# Patient Record
Sex: Female | Born: 1940 | Race: White | Hispanic: No | Smoking: Never smoker
Health system: Southern US, Community
[De-identification: ages and names within clinical notes are randomized; demographics above are authoritative.]

## PROBLEM LIST (undated history)

## (undated) DIAGNOSIS — F419 Anxiety disorder, unspecified: Secondary | ICD-10-CM

## (undated) DIAGNOSIS — I10 Essential (primary) hypertension: Secondary | ICD-10-CM

## (undated) DIAGNOSIS — E039 Hypothyroidism, unspecified: Secondary | ICD-10-CM

## (undated) DIAGNOSIS — K219 Gastro-esophageal reflux disease without esophagitis: Secondary | ICD-10-CM

## (undated) DIAGNOSIS — R195 Other fecal abnormalities: Secondary | ICD-10-CM

## (undated) DIAGNOSIS — E785 Hyperlipidemia, unspecified: Secondary | ICD-10-CM

## (undated) DIAGNOSIS — E079 Disorder of thyroid, unspecified: Secondary | ICD-10-CM

## (undated) DIAGNOSIS — Z87442 Personal history of urinary calculi: Secondary | ICD-10-CM

## (undated) HISTORY — DX: Essential (primary) hypertension: I10

## (undated) HISTORY — PX: CERVICAL POLYPECTOMY: SHX88

## (undated) HISTORY — PX: HUMERUS FRACTURE SURGERY: SHX670

## (undated) HISTORY — PX: EYE SURGERY: SHX253

## (undated) HISTORY — DX: Hyperlipidemia, unspecified: E78.5

## (undated) HISTORY — DX: Disorder of thyroid, unspecified: E07.9

## (undated) HISTORY — PX: WRIST FRACTURE SURGERY: SHX121

## (undated) HISTORY — DX: Other fecal abnormalities: R19.5

## (undated) HISTORY — DX: Gastro-esophageal reflux disease without esophagitis: K21.9

## (undated) HISTORY — PX: BREAST CYST ASPIRATION: SHX578

---

## 1980-09-03 HISTORY — PX: TUBAL LIGATION: SHX77

## 2004-10-08 ENCOUNTER — Emergency Department: Payer: Self-pay | Admitting: Emergency Medicine

## 2004-10-08 ENCOUNTER — Other Ambulatory Visit: Payer: Self-pay

## 2004-10-16 ENCOUNTER — Ambulatory Visit: Payer: Self-pay | Admitting: Internal Medicine

## 2004-11-02 ENCOUNTER — Ambulatory Visit: Payer: Self-pay | Admitting: Internal Medicine

## 2004-11-03 ENCOUNTER — Ambulatory Visit: Payer: Self-pay | Admitting: Internal Medicine

## 2005-09-26 ENCOUNTER — Ambulatory Visit: Payer: Self-pay | Admitting: Internal Medicine

## 2006-06-03 ENCOUNTER — Encounter: Payer: Self-pay | Admitting: Internal Medicine

## 2006-06-03 LAB — CONVERTED CEMR LAB

## 2006-06-25 ENCOUNTER — Other Ambulatory Visit: Admission: RE | Admit: 2006-06-25 | Discharge: 2006-06-25 | Payer: Self-pay | Admitting: Internal Medicine

## 2006-06-25 ENCOUNTER — Ambulatory Visit: Payer: Self-pay | Admitting: Internal Medicine

## 2006-06-27 ENCOUNTER — Ambulatory Visit: Payer: Self-pay | Admitting: Internal Medicine

## 2006-12-25 ENCOUNTER — Ambulatory Visit: Payer: Self-pay | Admitting: Internal Medicine

## 2006-12-25 LAB — CONVERTED CEMR LAB
Albumin: 4 g/dL
BUN: 17 mg/dL
CO2: 28 meq/L
Calcium: 9.5 mg/dL
Chloride: 106 meq/L
Creatinine, Ser: 0.8 mg/dL
GFR calc Af Amer: 93 mL/min
GFR calc non Af Amer: 77 mL/min
Glucose, Bld: 121 mg/dL — ABNORMAL HIGH
Hgb A1c MFr Bld: 6 %
Phosphorus: 4.3 mg/dL
Potassium: 4.6 meq/L
Sodium: 142 meq/L

## 2007-01-02 HISTORY — PX: COLONOSCOPY: SHX174

## 2007-01-16 ENCOUNTER — Ambulatory Visit: Payer: Self-pay | Admitting: Internal Medicine

## 2007-01-16 ENCOUNTER — Encounter: Payer: Self-pay | Admitting: Internal Medicine

## 2007-01-30 ENCOUNTER — Ambulatory Visit: Payer: Self-pay | Admitting: Internal Medicine

## 2007-01-30 LAB — HM COLONOSCOPY

## 2007-02-06 ENCOUNTER — Encounter: Payer: Self-pay | Admitting: Internal Medicine

## 2007-02-06 DIAGNOSIS — E1169 Type 2 diabetes mellitus with other specified complication: Secondary | ICD-10-CM | POA: Insufficient documentation

## 2007-02-06 DIAGNOSIS — E785 Hyperlipidemia, unspecified: Secondary | ICD-10-CM

## 2007-02-06 DIAGNOSIS — E039 Hypothyroidism, unspecified: Secondary | ICD-10-CM | POA: Insufficient documentation

## 2007-02-06 DIAGNOSIS — M810 Age-related osteoporosis without current pathological fracture: Secondary | ICD-10-CM | POA: Insufficient documentation

## 2007-02-06 DIAGNOSIS — K219 Gastro-esophageal reflux disease without esophagitis: Secondary | ICD-10-CM | POA: Insufficient documentation

## 2007-02-06 DIAGNOSIS — I10 Essential (primary) hypertension: Secondary | ICD-10-CM | POA: Insufficient documentation

## 2007-02-08 ENCOUNTER — Encounter: Payer: Self-pay | Admitting: Internal Medicine

## 2007-02-10 ENCOUNTER — Ambulatory Visit: Payer: Self-pay | Admitting: Internal Medicine

## 2007-08-12 ENCOUNTER — Ambulatory Visit: Payer: Self-pay | Admitting: Internal Medicine

## 2007-08-12 DIAGNOSIS — R7301 Impaired fasting glucose: Secondary | ICD-10-CM | POA: Insufficient documentation

## 2007-08-13 LAB — CONVERTED CEMR LAB
Albumin: 4 g/dL
BUN: 14 mg/dL
Basophils Absolute: 0 10*3/uL
Basophils Relative: 0 %
CO2: 31 meq/L
Calcium: 10.8 mg/dL — ABNORMAL HIGH
Chloride: 100 meq/L
Cholesterol: 212 mg/dL
Creatinine, Ser: 0.8 mg/dL
Direct LDL: 137.2 mg/dL
Eosinophils Absolute: 0.4 10*3/uL
Eosinophils Relative: 4.1 %
Free T4: 1.1 ng/dL
GFR calc Af Amer: 93 mL/min
GFR calc non Af Amer: 77 mL/min
Glucose, Bld: 115 mg/dL — ABNORMAL HIGH
HCT: 39.8 %
HDL: 35.7 mg/dL — ABNORMAL LOW
Hemoglobin: 13.8 g/dL
Lymphocytes Relative: 23.9 %
MCHC: 34.6 g/dL
MCV: 85 fL
Monocytes Absolute: 0.7 10*3/uL
Monocytes Relative: 7.3 %
Neutro Abs: 5.9 10*3/uL
Neutrophils Relative %: 64.7 %
Phosphorus: 4.7 mg/dL — ABNORMAL HIGH
Platelets: 185 10*3/uL
Potassium: 4.5 meq/L
RBC: 4.68 M/uL
RDW: 12.2 %
Sodium: 139 meq/L
TSH: 1.14 u[IU]/mL
Total CHOL/HDL Ratio: 5.9
Triglycerides: 276 mg/dL
VLDL: 55 mg/dL — ABNORMAL HIGH
WBC: 9.2 10*3/uL

## 2007-09-16 ENCOUNTER — Encounter: Payer: Self-pay | Admitting: Internal Medicine

## 2007-09-19 ENCOUNTER — Encounter (INDEPENDENT_AMBULATORY_CARE_PROVIDER_SITE_OTHER): Payer: Self-pay | Admitting: *Deleted

## 2008-02-18 ENCOUNTER — Ambulatory Visit: Payer: Self-pay | Admitting: Internal Medicine

## 2008-02-19 LAB — CONVERTED CEMR LAB
Albumin: 4.1 g/dL
BUN: 14 mg/dL
CO2: 31 meq/L
Calcium: 9.7 mg/dL
Chloride: 104 meq/L
Creatinine, Ser: 0.8 mg/dL
GFR calc Af Amer: 92 mL/min
GFR calc non Af Amer: 76 mL/min
Glucose, Bld: 117 mg/dL — ABNORMAL HIGH
Hgb A1c MFr Bld: 6.3 % — ABNORMAL HIGH
Phosphorus: 4.4 mg/dL
Potassium: 4.5 meq/L
Sodium: 141 meq/L

## 2008-02-20 LAB — CONVERTED CEMR LAB
Calcium, Total (PTH): 9.9 mg/dL
PTH: 50.3 pg/mL

## 2008-05-28 ENCOUNTER — Emergency Department: Payer: Self-pay

## 2008-05-28 ENCOUNTER — Telehealth: Payer: Self-pay | Admitting: Internal Medicine

## 2008-08-23 ENCOUNTER — Ambulatory Visit: Payer: Self-pay | Admitting: Internal Medicine

## 2008-08-24 LAB — CONVERTED CEMR LAB
Albumin: 3.9 g/dL
BUN: 25 mg/dL — ABNORMAL HIGH
CO2: 29 meq/L
Calcium: 9.5 mg/dL
Chloride: 107 meq/L
Creatinine, Ser: 0.8 mg/dL
Free T4: 0.9 ng/dL
GFR calc Af Amer: 92 mL/min
GFR calc non Af Amer: 76 mL/min
Glucose, Bld: 121 mg/dL — ABNORMAL HIGH
Phosphorus: 4.5 mg/dL
Potassium: 4.3 meq/L
Sodium: 141 meq/L
TSH: 1.87 u[IU]/mL

## 2009-02-21 ENCOUNTER — Ambulatory Visit: Payer: Self-pay | Admitting: Internal Medicine

## 2009-03-02 ENCOUNTER — Encounter: Payer: Self-pay | Admitting: Internal Medicine

## 2009-03-11 ENCOUNTER — Encounter: Payer: Self-pay | Admitting: Internal Medicine

## 2009-04-24 ENCOUNTER — Emergency Department: Payer: Self-pay | Admitting: Unknown Physician Specialty

## 2009-04-26 ENCOUNTER — Ambulatory Visit: Payer: Self-pay | Admitting: Unknown Physician Specialty

## 2009-05-18 ENCOUNTER — Encounter: Payer: Self-pay | Admitting: Internal Medicine

## 2009-06-08 ENCOUNTER — Encounter: Payer: Self-pay | Admitting: Internal Medicine

## 2009-06-13 ENCOUNTER — Encounter: Payer: Self-pay | Admitting: Internal Medicine

## 2009-09-12 ENCOUNTER — Ambulatory Visit: Payer: Self-pay | Admitting: Internal Medicine

## 2009-09-12 DIAGNOSIS — I498 Other specified cardiac arrhythmias: Secondary | ICD-10-CM | POA: Insufficient documentation

## 2010-03-13 ENCOUNTER — Ambulatory Visit: Payer: Self-pay | Admitting: Internal Medicine

## 2010-03-15 LAB — CONVERTED CEMR LAB
Albumin: 4.2 g/dL
BUN: 19 mg/dL
CO2: 31 meq/L
Calcium: 10.7 mg/dL — ABNORMAL HIGH
Chloride: 103 meq/L
Creatinine, Ser: 0.8 mg/dL
GFR calc non Af Amer: 74.63 mL/min
Glucose, Bld: 102 mg/dL — ABNORMAL HIGH
Phosphorus: 4.1 mg/dL
Potassium: 4.8 meq/L
Sodium: 141 meq/L

## 2010-03-16 ENCOUNTER — Encounter: Payer: Self-pay | Admitting: Internal Medicine

## 2010-03-16 LAB — HM MAMMOGRAPHY: HM Mammogram: NORMAL

## 2010-09-13 ENCOUNTER — Ambulatory Visit: Admit: 2010-09-13 | Payer: Self-pay | Admitting: Internal Medicine

## 2010-10-01 LAB — CONVERTED CEMR LAB
ALT: 17 U/L
AST: 17 U/L
Albumin: 4 g/dL
Alkaline Phosphatase: 88 U/L
BUN: 17 mg/dL
Basophils Absolute: 0.1 10*3/uL
Basophils Relative: 0.6 %
Bilirubin, Direct: 0 mg/dL
CO2: 33 meq/L — ABNORMAL HIGH
Calcium: 10.4 mg/dL
Chloride: 102 meq/L
Creatinine, Ser: 0.9 mg/dL
Eosinophils Absolute: 0.3 10*3/uL
Eosinophils Relative: 2.9 %
Free T4: 0.9 ng/dL
GFR calc non Af Amer: 66.19 mL/min
Glucose, Bld: 111 mg/dL — ABNORMAL HIGH
HCT: 40.5 %
Hemoglobin: 13.7 g/dL
Hgb A1c MFr Bld: 6.3 %
Lymphocytes Relative: 19.2 %
Lymphs Abs: 1.9 10*3/uL
MCHC: 33.9 g/dL
MCV: 88.2 fL
Monocytes Absolute: 0.8 10*3/uL
Monocytes Relative: 7.8 %
Neutro Abs: 6.8 10*3/uL
Neutrophils Relative %: 69.5 %
Phosphorus: 4.8 mg/dL — ABNORMAL HIGH
Platelets: 193 10*3/uL
Potassium: 5.3 meq/L — ABNORMAL HIGH
RBC: 4.59 M/uL
RDW: 12.7 %
Sodium: 140 meq/L
TSH: 5.13 u[IU]/mL
Total Bilirubin: 1 mg/dL
Total Protein: 7.6 g/dL
WBC: 9.9 10*3/uL

## 2010-10-03 NOTE — Assessment & Plan Note (Signed)
 Summary: 6 M /FU  DLO   Vital Signs:  Patient Profile:   70 Years Old Female Height:     64 inches (160.66 cm) Weight:      156 pounds Temp:     97.5 degrees F oral Pulse rate:   68 / minute Pulse rhythm:   regular BP sitting:   132 / 76  (left arm) Cuff size:   regular  Vitals Entered By: Renelda Luster (August 23, 2008 10:25 AM)                 Chief Complaint:  follow-up.  History of Present Illness: Fractured top of right humerus at shoulder in September Put in sling Getting PT which is just ending Back to using it normally Still using diclofenac as needed   No other new problems  No chest pain No SOB No palpitations  Occ rare heartburn--mild Tums usually relieves  Not much exercise weight is about the same  No headaches or vision change recent eye exam    Current Allergies (reviewed today): No known allergies   Past Medical History:    Reviewed history from 08/12/2007 and no changes required:       GERD       Hyperlipidemia       Hypertension       Hypothyroidism       Osteoporosis       Glucose intolerance                Past Surgical History:    VD x 2     1991 - Cervical polyps ([place])    1980's  -  Breast cysts  (aspirated)    9/09  Fx right humerus-no surgery   Social History:    Reviewed history from 08/12/2007 and no changes required:       Marital Status: Married       Children: 2       Occupation: Tourist information centre manager. Daughter also lives with her now       Never Smoked       Alcohol use-no       Drug use-no           Review of Systems  The patient denies syncope, peripheral edema, abdominal pain, severe indigestion/heartburn, and depression.         Occ still gets anxiety--uses lorazepam  occ Husband will be home now--[employer] dealership is closing still has daughter and granddaughter   Physical Exam  General:     alert and normal appearance.   Neck:     supple, no masses, no thyromegaly, no carotid  bruits, and no cervical lymphadenopathy.   Lungs:     normal respiratory effort and normal breath sounds.   Heart:     regular rhythm, no murmur, and no gallop.   Msk:     only mild decrease in abduction and rotation of right shoulder Extremities:     no edema Neurologic:     alert & oriented X3 and strength normal in all extremities.   Psych:     normally interactive, good eye contact, not anxious appearing, and not depressed appearing.      Impression & Recommendations:  Problem # 1:  HYPERTENSION (ICD-401.9) Assessment: Unchanged good control  no changes needed  Her updated medication list for this problem includes:    Atenolol 25 Mg Tabs (Atenolol) .SABRA... 1 by mouth daily  BP today: 132/76 Prior BP: 140/80 (02/18/2008)  Labs Reviewed: Creat:  0.8 (02/18/2008) Chol: 212 (08/12/2007)   HDL: 35.7 (08/12/2007)   LDL: 137.2 (08/12/2007)   TG: 276 (08/12/2007)  Orders: TLB-Renal Function Panel (80069-RENAL)   Problem # 2:  HYPOTHYROIDISM (ICD-244.9) Assessment: Unchanged due to repeat blood work  Her updated medication list for this problem includes:    Synthroid  88 Mcg Tabs (Levothyroxine  sodium) .SABRA... 1 by mouth daily   generic  Labs Reviewed: TSH: 1.14 (08/12/2007)    HgBA1c: 6.3 (02/18/2008) Chol: 212 (08/12/2007)   HDL: 35.7 (08/12/2007)   LDL: 137.2 (08/12/2007)   TG: 276 (08/12/2007)  Orders: TLB-TSH (Thyroid  Stimulating Hormone) (84443-TSH) TLB-T4 (Thyrox), Free (15560-QU5M) Venipuncture (63584)   Problem # 3:  OSTEOPOROSIS (ICD-733.00) Assessment: Unchanged is on vitamin D  also humerus fracture was from sig fall  Her updated medication list for this problem includes:    Calcium  Carbonate 600 Mg Tabs (Calcium  carbonate) .SABRA... Daily   Problem # 4:  IMPAIRED FASTING GLUCOSE (ICD-790.21) Assessment: Comment Only will recheck  work on exercise  Complete Medication List: 1)  Atenolol 25 Mg Tabs (Atenolol) .SABRA.. 1 by mouth daily 2)  Synthroid  88 Mcg  Tabs (Levothyroxine  sodium) .SABRA.. 1 by mouth daily   generic 3)  Multivitamins Tabs (Multiple vitamin) .SABRA.. 1 by mouth daily 4)  Calcium  Carbonate 600 Mg Tabs (Calcium  carbonate) .... Daily 5)  Diclofenac Sodium 75 Mg Tbec (Diclofenac sodium) .SABRA.. 1 two times a day as needed for pain 6)  Lorazepam  0.5 Mg Tabs (Lorazepam ) .SABRA.. 1 two times a day as needed for anxiety   Patient Instructions: 1)  Please schedule a follow-up appointment in 6 months.   Prescriptions: LORAZEPAM  0.5 MG TABS (LORAZEPAM ) 1 two times a day as needed for anxiety  #60 x 0   Entered and Authorized by:   Charlie Richmond Denise MD   Signed by:   Charlie Richmond Denise MD on 08/23/2008   Method used:   Print then Give to Patient   RxID:   [id number]  ] Current Allergies (reviewed today): No known allergies

## 2010-10-03 NOTE — Letter (Signed)
 Summary: [facility]  [facility]   Imported By: Mliss Shutter 06/17/2009 16:53:25  _____________________________________________________________________  External Attachment:    Type:   Image     Comment:   External Document  Appended Document: [facility] DEXA -2.6 in spine -1.5 in hip On calcium  and vitamin D 

## 2010-10-03 NOTE — Letter (Signed)
 Summary: Results Follow up Letter  [place] at [facility]  [address] [place], [place] [address]   Phone: [phone]  Fax: [phone]    09/19/2007 MRN: [id number]  BRAYLEN DENUNZIO [address] [place], [place]  [address]  Dear Ms. Swallow,  The following are the results of your recent test(s):  Test         Result    Pap Smear:        Normal _____  Not Normal _____ Comments: ______________________________________________________ Cholesterol: LDL(Bad cholesterol):         Your goal is less than:         HDL (Good cholesterol):       Your goal is more than: Comments:  ______________________________________________________ Mammogram:        Normal __X___  Not Normal _____ Comments:  ___________________________________________________________________ Hemoccult:        Normal _____  Not normal _______ Comments:    _____________________________________________________________________ Other Tests:    We routinely do not discuss normal results over the telephone.  If you desire a copy of the results, or you have any questions about this information we can discuss them at your next office visit.   Sincerely,  Dr. Letvak

## 2010-10-03 NOTE — Assessment & Plan Note (Signed)
 Summary: 6 mo. f/u/bir   Vital Signs:  Patient profile:   70 year old female Weight:      156 pounds BMI:     26.87 Temp:     98.4 degrees F oral Pulse rate:   72 / minute Pulse rhythm:   regular BP sitting:   110 / 68  (left arm) Cuff size:   regular  Vitals Entered By: Judge Sharps CMA (February 21, 2009 10:50 AM)  History of Present Illness: Chief Complaint: 6 month follow-up   doing fine Feels good  No chest pain  No SOB Doesn't check blood pressure No headaches  No stomach problems Occ heartburn--uses Tums as needed and it helps could be if she eats too fast  Tries to be careful limiting sweets Still puts sugar in tea--discussed using artificial sweeteners Occ walks some--not really into it yet  Allergies: No Known Drug Allergies  Past History:  Past medical, surgical, family and social histories (including risk factors) reviewed for relevance to current acute and chronic problems.  Past Medical History: GERD Hyperlipidemia Hypertension Hypothyroidism Osteoporosis Impaired fasting glucose  Past Surgical History: Reviewed history from 08/23/2008 and no changes required. VD x 2  1991 - Cervical polyps ([place]) 1980's  -  Breast cysts  (aspirated) 9/09  Fx right humerus-no surgery  Family History: Reviewed history from 02/06/2007 and no changes required. Father: Died 24 MI, lung cancer Mother: Died 20, HTN, stroke Siblings: 2 brothers alive, NIDDM, HTN CAD--  Dad (due to CA?), Mat GP died of MI HTN-- Mom, mat. aunt, son DM:  Brothers Strokes in family Pat  uncle died ?melanoma     Pat  aunt died [place]  Social History: Reviewed history from 08/12/2007 and no changes required. Marital Status: Married Children: 2 Occupation: Tourist information centre manager. Daughter also lives with her now Never Smoked Alcohol use-no Drug use-no  Review of Systems       Weight is stable sleeps fine in general--does occ waken in night--usually able to get back to  sleep bowels okay  Physical Exam  General:  alert and normal appearance.   Neck:  supple, no masses, no thyromegaly, no carotid bruits, and no cervical lymphadenopathy.   Lungs:  normal respiratory effort and normal breath sounds.   Heart:  normal rate, regular rhythm, no murmur, and no gallop.   Abdomen:  soft and non-tender.   Msk:  no joint tenderness and no joint swelling.   Extremities:  no edema Psych:  normally interactive, good eye contact, not anxious appearing, and not depressed appearing.     Impression & Recommendations:  Problem # 1:  HYPERTENSION (ICD-401.9) Assessment Unchanged good control no changes needed  Her updated medication list for this problem includes:    Atenolol 25 Mg Tabs (Atenolol) .SABRA... 1 by mouth daily  BP today: 110/68 Prior BP: 132/76 (08/23/2008)  Labs Reviewed: K+: 4.3 (08/23/2008) Creat: : 0.8 (08/23/2008)   Chol: 212 (08/12/2007)   HDL: 35.7 (08/12/2007)   LDL: DEL (08/12/2007)   TG: 276 (08/12/2007)  Problem # 2:  IMPAIRED FASTING GLUCOSE (ICD-790.21) Assessment: Comment Only counselled about better eating fortunately weight is stable  Problem # 3:  GERD (ICD-530.81) Assessment: Unchanged good control only occ needs tums  Problem # 4:  HYPOTHYROIDISM (ICD-244.9) Assessment: Unchanged good control labs next time  Her updated medication list for this problem includes:    Synthroid  88 Mcg Tabs (Levothyroxine  sodium) .SABRA... 1 by mouth daily   generic  Labs Reviewed: TSH: 1.87 (  08/23/2008)    HgBA1c: 6.3 (02/18/2008) Chol: 212 (08/12/2007)   HDL: 35.7 (08/12/2007)   LDL: DEL (08/12/2007)   TG: 276 (08/12/2007)  Complete Medication List: 1)  Atenolol 25 Mg Tabs (Atenolol) .SABRA.. 1 by mouth daily 2)  Synthroid  88 Mcg Tabs (Levothyroxine  sodium) .SABRA.. 1 by mouth daily   generic 3)  Multivitamins Tabs (Multiple vitamin) .SABRA.. 1 by mouth daily 4)  Calcium  Carbonate 600 Mg Tabs (Calcium  carbonate) .... Daily  Other Orders: Radiology  Referral (Radiology)  Patient Instructions: 1)  Please schedule a follow-up appointment in 6 months .   Current Allergies (reviewed today): No known allergies

## 2010-10-03 NOTE — Letter (Signed)
 Summary: Results Follow up Letter  [name] at [facility]  [address] [place], [place] [address]   Phone: [phone]  Fax: [phone]    03/11/2009 MRN: [id number]  [name] [address] [place], [place]  [address]  Dear Ms. [place]aston,  The following are the results of your recent test(s):  Test         Result    Pap Smear:        Normal _____  Not Normal _____ Comments: ______________________________________________________ Cholesterol: LDL(Bad cholesterol):         Your goal is less than:         HDL (Good cholesterol):       Your goal is more than: Comments:  ______________________________________________________ Mammogram:        Normal __X___  Not Normal _____ Comments: Repeat in 1 year  ___________________________________________________________________ Hemoccult:        Normal _____  Not normal _______ Comments:    _____________________________________________________________________ Other Tests:    We routinely do not discuss normal results over the telephone.  If you desire a copy of the results, or you have any questions about this information we can discuss them at your next office visit.   Sincerely,      Charlie Jimmy COME

## 2010-10-03 NOTE — Miscellaneous (Signed)
" °  Clinical Lists Changes  Observations: Added new observation of MAMMOGRAM: normal (09/19/2007 15:19)       Preventive Care Screening  Mammogram:    Date:  09/19/2007    Results:  normal "

## 2010-10-03 NOTE — Letter (Signed)
 Summary: [facility]   Imported By: Mliss Shutter 06/27/2009 15:59:07  _____________________________________________________________________  External Attachment:    Type:   Image     Comment:   External Document  Appended Document: [facility] doing well postop for left wrist

## 2010-10-03 NOTE — Assessment & Plan Note (Signed)
Summary: 6 m f/u dlo   Vital Signs:  Patient profile:   70 year old female Weight:      154 pounds Temp:     98.6 degrees F oral Pulse rate:   52 / minute Pulse rhythm:   regular BP sitting:   130 / 60  (left arm) Cuff size:   regular  Vitals Entered By: Mervin Hack CMA Duncan Dull) (September 12, 2009 10:29 AM) CC: 6 month follow-up   History of Present Illness: Doing well in general Recovered from surgery to left wrist this summer  Notes intermittent right knee pain Can be severe at times wears brace at times--when pain comes on No swelling Pain along medial border--hard to walk for 2 days Esp bad with twisting  reviewed bone density results will just continue calcuim and vitamin D  No problems with BP med No chest pain No SOB  Allergies: No Known Drug Allergies  Past History:  Past medical, surgical, family and social histories (including risk factors) reviewed for relevance to current acute and chronic problems.  Past Medical History: Reviewed history from 02/21/2009 and no changes required. GERD Hyperlipidemia Hypertension Hypothyroidism Osteoporosis Impaired fasting glucose  Past Surgical History: VD x 2  1991 - Cervical polyps ([place]) 1980's  -  Breast cysts  (aspirated) 9/09  Fx right humerus-no surgery 8/10 Left wrist fracture--plate put in  --- Dr Gerrit Heck  Family History: Reviewed history from 02/06/2007 and no changes required. Father: Died 21 MI, lung cancer Mother: Died 36, HTN, stroke Siblings: 2 brothers alive, NIDDM, HTN CAD--  Dad (due to CA?), Mat GP died of MI HTN-- Mom, mat. aunt, son DM:  Brothers Strokes in family Pat  uncle died ?melanoma     Pat  aunt died [place]  Social History: Reviewed history from 08/12/2007 and no changes required. Marital Status: Married Children: 2 Occupation: Tourist information centre manager. Daughter also lives with her now Never Smoked Alcohol use-no Drug use-no  Review of Systems       occ very brief  orthosatic dizziness notes bradycardia at times--as low as 35 last year into 40's not unusually weight is stable  Physical Exam  General:  alert and normal appearance.   Neck:  supple, no masses, no thyromegaly, no carotid bruits, and no cervical lymphadenopathy.   Lungs:  normal respiratory effort and normal breath sounds.   Heart:  regular rhythm, no murmur, no gallop, and bradycardia.   Abdomen:  soft and non-tender.   Msk:  No swelling in knees normal ROM very slight discomfort with medial meniscus stress on MacMurray's Extremities:  no edema Psych:  normally interactive, good eye contact, not anxious appearing, and not depressed appearing.     Impression & Recommendations:  Problem # 1:  HYPERTENSION (ICD-401.9) Assessment Unchanged  good control stable bradycardia--never any symptoms will cut the atenolol in half  Her updated medication list for this problem includes:    Atenolol 25 Mg Tabs (Atenolol) .Marland Kitchen... 1/2  by mouth daily  BP today: 130/60 Prior BP: 110/68 (02/21/2009)  Labs Reviewed: K+: 4.3 (08/23/2008) Creat: : 0.8 (08/23/2008)   Chol: 212 (08/12/2007)   HDL: 35.7 (08/12/2007)   LDL: DEL (08/12/2007)   TG: 276 (08/12/2007)  Orders: TLB-Renal Function Panel (80069-RENAL) TLB-CBC Platelet - w/Differential (85025-CBCD) TLB-Hepatic/Liver Function Pnl (80076-HEPATIC) Venipuncture (64403)  Problem # 2:  OSTEOPOROSIS (ICD-733.00) Assessment: Unchanged will recheck DEXA in 3-5 years consider meds again if sig decline  The following medications were removed from the medication list:  Calcium Carbonate 600 Mg Tabs (Calcium carbonate) .Marland Kitchen... Daily Her updated medication list for this problem includes:    Oscal 500/200 D-3 500-200 Mg-unit Tabs (Calcium-vitamin d) .Marland Kitchen... Take 1 by mouth two times a day  Problem # 3:  HYPOTHYROIDISM (ICD-244.9) Assessment: Unchanged  due for labs  Her updated medication list for this problem includes:    Synthroid 88 Mcg  Tabs (Levothyroxine sodium) .Marland Kitchen... 1 by mouth daily   generic  Labs Reviewed: TSH: 1.87 (08/23/2008)    HgBA1c: 6.3 (02/18/2008) Chol: 212 (08/12/2007)   HDL: 35.7 (08/12/2007)   LDL: DEL (08/12/2007)   TG: 276 (08/12/2007)  Orders: TLB-T4 (Thyrox), Free [id number]) TLB-TSH (Thyroid Stimulating Hormone) (84443-TSH)  Problem # 4:  IMPAIRED FASTING GLUCOSE (ICD-790.21) Assessment: Unchanged  will check labs again  Labs Reviewed: Creat: 0.8 (08/23/2008)     Orders: TLB-A1C / Hgb A1C (Glycohemoglobin) (83036-A1C)  Complete Medication List: 1)  Atenolol 25 Mg Tabs (Atenolol) .... 1/2  by mouth daily 2)  Synthroid 88 Mcg Tabs (Levothyroxine sodium) .Marland Kitchen.. 1 by mouth daily   generic 3)  Multivitamins Tabs (Multiple vitamin) .Marland Kitchen.. 1 by mouth daily 4)  Oscal 500/200 D-3 500-200 Mg-unit Tabs (Calcium-vitamin d) .... Take 1 by mouth two times a day  Other Orders: EKG w/ Interpretation (93000)  Patient Instructions: 1)  Please schedule a follow-up appointment in 6 months .   Current Allergies (reviewed today): No known allergies      EKG  Procedure date:  09/12/2009  Findings:      sinus bradycardia @38  No sig change from 1/07

## 2010-10-03 NOTE — Miscellaneous (Signed)
" °  Clinical Lists Changes  Observations: Added new observation of COLONOSCOPY:  Results: Diverticulosis.        (01/30/2007 13:50)     Colonoscopy  Procedure date:  01/30/2007  Findings:       Results: Diverticulosis.         "

## 2010-10-03 NOTE — Assessment & Plan Note (Signed)
 Summary: 1-2 month follow up/rbh   Vital Signs:  Patient Profile:   70 Years Old Female Height:     63.25 inches (160.66 cm) Weight:      156 pounds Temp:     98.2 degrees F oral Pulse rate:   60 / minute BP sitting:   138 / 72  (left arm)  Vitals Entered By: Avelina Lefevre (February 10, 2007 9:55 AM)               Chief Complaint:  1 - 2 mo fu.  History of Present Illness: Cough is better took prilosec for 2 weeks and has used it only once since then No reflux symptoms now does get tickle and occ cough but better with a sucking candy  Stopped fosamax--was on for 4 years. Discussed rechecking this in 2-3 years and reevaluating the need for Rx I recommended she continue the calcium  and Vit D  Has peeling and dry hands--thinks her yard work exacerbates Uses cetaphil and triamcinolone  Dove soap  Current Allergies: No known allergies   Past Medical History:    Reviewed history from 02/06/2007 and no changes required:       GERD       Hyperlipidemia       Hypertension       Hypothyroidism       Osteoporosis       Glucose intolerance              CONSULTANTS       Dr Roni      Physical Exam  General:     alert and normal appearance.   Neck:     supple, no thyromegaly, and no carotid bruits.   Lungs:     normal respiratory effort and normal breath sounds.   Heart:     normal rate, regular rhythm, no murmur, and no gallop.   Abdomen:     soft, non-tender, and no masses.   Extremities:     no edema Skin:     mild drying in hands Psych:     normally interactive, good eye contact, not anxious appearing, and not depressed appearing.      Impression & Recommendations:  Problem # 1:  COUGH (ICD-786.2) Assessment: Improved Probably was reflux related and is improved off fosamax despite not staying on prilosec  Problem # 2:  HYPERTENSION (ICD-401.9) Assessment: Unchanged God control Her updated medication list for this problem includes:    Atenolol  25 Mg Tabs (Atenolol) .SABRA... 1 by mouth daily    Patient Instructions: 1)  Please schedule a follow-up appointment in 6 months.

## 2010-10-03 NOTE — Assessment & Plan Note (Signed)
Summary: 6 MONTH FOLLOW UP/[facility]   Vital Signs:  Patient profile:   70 year old female Weight:      152 pounds BMI:     26.19 Temp:     97.5 degrees F tympanic Pulse rate:   56 / minute Pulse rhythm:   regular BP sitting:   148 / 70  (left arm) Cuff size:   regular  Vitals Entered By: Mervin Hack CMA Duncan Dull) (March 13, 2010 10:44 AM) CC: 6 month follow-up   History of Present Illness: Doing well Wrist is completely healed up -- "finally 100%" in using it  No chest pain No SOB Exercise tolerance is good--back to walking  Occ right knee pain wears brace for walking no sig pain meds needed  No sig heartburn bowels okay  Allergies: No Known Drug Allergies  Past History:  Past medical, surgical, family and social histories (including risk factors) reviewed for relevance to current acute and chronic problems.  Past Medical History: Reviewed history from 02/21/2009 and no changes required. GERD Hyperlipidemia Hypertension Hypothyroidism Osteoporosis Impaired fasting glucose  Past Surgical History: Reviewed history from 09/12/2009 and no changes required. VD x 2  1991 - Cervical polyps ([place]) 1980's  -  Breast cysts  (aspirated) 9/09  Fx right humerus-no surgery 8/10 Left wrist fracture--plate put in  --- Dr Gerrit Heck  Family History: Reviewed history from 02/06/2007 and no changes required. Father: Died 51 MI, lung cancer Mother: Died 74, HTN, stroke Siblings: 2 brothers alive, NIDDM, HTN CAD--  Dad (due to [place]?), Mat GP died of MI HTN-- Mom, mat. aunt, son DM:  Brothers Strokes in family Pat  uncle died ?melanoma     Pat  aunt died [place]  Social History: Reviewed history from 08/12/2007 and no changes required. Marital Status: Married Children: 2 Occupation: Tourist information centre manager. Daughter also lives with her now Never Smoked Alcohol use-no Drug use-no  Review of Systems       does self breast exam--nothing worrisome sleeps fairly well--does have  some early awakening but goes to sleep early also appetite is fine weight is stable trying to walk daily  Physical Exam  General:  alert and normal appearance.   Neck:  supple, no masses, no thyromegaly, no carotid bruits, and no cervical lymphadenopathy.   Lungs:  normal respiratory effort and normal breath sounds.   Heart:  regular rhythm, no murmur, no gallop, and bradycardia.   Abdomen:  soft, non-tender, and no masses.   Msk:  no joint tenderness and no joint swelling.   Pulses:  2+ in feet Extremities:  no edema Psych:  normally interactive, good eye contact, not anxious appearing, and not depressed appearing.     Impression & Recommendations:  Problem # 1:  HYPERTENSION (ICD-401.9) Assessment Unchanged  good control still with bradycardia from the med but tolerates well  Her updated medication list for this problem includes:    Atenolol 25 Mg Tabs (Atenolol) .Marland Kitchen... 1/2  by mouth daily  BP today: 148/70 Prior BP: 130/60 (09/12/2009)  Labs Reviewed: K+: 5.3 (09/12/2009) Creat: : 0.9 (09/12/2009)   Chol: 212 (08/12/2007)   HDL: 35.7 (08/12/2007)   LDL: DEL (08/12/2007)   TG: 276 (08/12/2007)  Orders: TLB-Renal Function Panel (80069-RENAL) Venipuncture (09811)  Problem # 2:  GERD (ICD-530.81) Assessment: Unchanged doing well without meds  Problem # 3:  OSTEOPOROSIS (ICD-733.00) Assessment: Unchanged remains on supplements  Her updated medication list for this problem includes:    Oscal 500/200 D-3 500-200 Mg-unit Tabs (Calcium-vitamin  d) ..... Take 1 by mouth once daily  Problem # 4:  HYPOTHYROIDISM (ICD-244.9) Assessment: Unchanged euthyroid  Her updated medication list for this problem includes:    Synthroid 88 Mcg Tabs (Levothyroxine sodium) .Marland Kitchen... 1 by mouth daily   generic  Labs Reviewed: TSH: 5.13 (09/12/2009)    HgBA1c: 6.3 (09/12/2009) Chol: 212 (08/12/2007)   HDL: 35.7 (08/12/2007)   LDL: DEL (08/12/2007)   TG: 276 (08/12/2007)  Complete  Medication List: 1)  Atenolol 25 Mg Tabs (Atenolol) .... 1/2  by mouth daily 2)  Synthroid 88 Mcg Tabs (Levothyroxine sodium) .Marland Kitchen.. 1 by mouth daily   generic 3)  Multivitamins Tabs (Multiple vitamin) .Marland Kitchen.. 1 by mouth daily 4)  Oscal 500/200 D-3 500-200 Mg-unit Tabs (Calcium-vitamin d) .... Take 1 by mouth once daily 5)  Vitamin B-12 1000 Mcg Tabs (Cyanocobalamin) .... Take 1 by mouth once daily  Other Orders: Radiology Referral (Radiology)  Patient Instructions: 1)  Please schedule a follow-up appointment in 6 months .  2)  Schedule your mammogram.   Current Allergies (reviewed today): No known allergies

## 2010-10-03 NOTE — Assessment & Plan Note (Signed)
 Summary: FOLLOW UP/EVE   Vital Signs:  Patient Profile:   70 Years Old Female Height:     63.25 inches (160.66 cm) Weight:      159.13 pounds Temp:     97 degrees F oral Pulse rate:   60 / minute BP sitting:   154 / 62  (left arm)  Vitals Entered By: Avelina Lefevre (August 12, 2007 10:14 AM)                 Chief Complaint:  6 mo fu.  History of Present Illness: Doing okay No new concerns  Has gained about 3# Was walking but has slacked off Still watches granddaughter---daughter and granddaughter both living with her  Hasn't seen Dr Roni Due for mammo Paps have always been normal  Current Allergies (reviewed today): No known allergies   Past Medical History:    Reviewed history from 02/06/2007 and no changes required:       GERD       Hyperlipidemia       Hypertension       Hypothyroidism       Osteoporosis       Glucose intolerance                Past Surgical History:    Reviewed history from 02/06/2007 and no changes required:       VD x 2        1991 - Cervical polyps ([place])       1980's  -  Breast cysts  (aspirated)   Family History:    Reviewed history from 02/06/2007 and no changes required:       Father: Died 64 MI, lung cancer       Mother: Died 32, HTN, stroke       Siblings: 2 brothers alive, NIDDM, HTN       CAD--  Dad (due to [place]?), Mat GP died of MI       HTN-- Mom, mat. aunt, son       DM:  Brothers       Strokes in family       Pat  uncle died ?melanoma     Pat  aunt died [place]  Social History:    Marital Status: Married    Children: 2    Occupation: Tourist information centre manager. Daughter also lives with her now    Never Smoked    Alcohol use-no    Drug use-no        Review of Systems  The patient denies chest pain, syncope, dyspnea on exhertion, abdominal pain, melena, hematochezia, and depression.         Occ gets substernal burning when under stress uses omeprazole if this is more freq and it resolves Has not  needed lorazepam  lately Some memory problems--not progressive Stomach bug about 3 months ago   Physical Exam  General:     alert and normal appearance.   Mouth:     no erythema and no exudates.   Neck:     supple, no masses, no thyromegaly, no carotid bruits, and no cervical lymphadenopathy.   Lungs:     normal respiratory effort and normal breath sounds.   Heart:     normal rate, regular rhythm, no murmur, and no gallop.   Abdomen:     soft, non-tender, and no masses.   Extremities:     no edema Psych:     normally interactive, good eye contact, not  anxious appearing, and not depressed appearing.      Impression & Recommendations:  Problem # 1:  HYPERTENSION (ICD-401.9) Assessment: Unchanged good control No changes needed  Her updated medication list for this problem includes:    Atenolol 25 Mg Tabs (Atenolol) .SABRA... 1 by mouth daily  Orders: TLB-Renal Function Panel (80069-RENAL) TLB-CBC Platelet - w/Differential (85025-CBCD)   Problem # 2:  HYPOTHYROIDISM (ICD-244.9) Assessment: Unchanged will check labs  Her updated medication list for this problem includes:    Synthroid  88 Mcg Tabs (Levothyroxine  sodium) .SABRA... 1 by mouth daily   generic  Orders: TLB-T4 (Thyrox), Free [id number]) TLB-TSH (Thyroid  Stimulating Hormone) (84443-TSH)   Problem # 3:  IMPAIRED FASTING GLUCOSE (ICD-790.21) Assessment: Unchanged recheck labs Needs to exercise and watch weight  Problem # 4:  GERD (ICD-530.81) Assessment: Unchanged does okay with as needed omeprazole  Problem # 5:  OSTEOPOROSIS (ICD-733.00) Assessment: Unchanged needs to refill her vitamin D   Her updated medication list for this problem includes:    Calcium  Carbonate 600 Mg Tabs (Calcium  carbonate) .SABRA... Daily   Complete Medication List: 1)  Atenolol 25 Mg Tabs (Atenolol) .SABRA.. 1 by mouth daily 2)  Synthroid  88 Mcg Tabs (Levothyroxine  sodium) .SABRA.. 1 by mouth daily   generic 3)  Multivitamins Tabs  (Multiple vitamin) .SABRA.. 1 by mouth daily 4)  Calcium  Carbonate 600 Mg Tabs (Calcium  carbonate) .... Daily 5)  Lorazepam  0.5 Mg Tabs (Lorazepam ) .SABRA.. 1 two times a day as needed for anxiety  Other Orders: Flu Vaccine 57yrs + (09341) Admin 1st Vaccine (09528) Radiology Referral (Radiology) Venipuncture (63584) TLB-Lipid Panel (80061-LIPID)   Patient Instructions: 1)  Please schedule a follow-up appointment in 6 months. Will need Pap and breast exam then 2)  Schedule your mammogram.    ]   Current Allergies (reviewed today): No known allergies  Current Medications (including changes made in today's visit):  ATENOLOL 25 MG TABS (ATENOLOL) 1 by mouth daily SYNTHROID  88 MCG TABS (LEVOTHYROXINE  SODIUM) 1 by mouth daily   Generic MULTIVITAMINS  TABS (MULTIPLE VITAMIN) 1 by mouth daily CALCIUM  CARBONATE 600 MG TABS (CALCIUM  CARBONATE) daily LORAZEPAM  0.5 MG  TABS (LORAZEPAM ) 1 two times a day as needed for anxiety    Influenza Vaccine    Vaccine Type: Fluvax 3+    Site: right deltoid    Mfr: Sanofi Pasteur    Dose: 0.5 ml    Route: IM    Given by: Avelina Lefevre    Exp. Date: 03/02/2008    Lot #: [id number]    VIS given: 03/27/07 version given August 12, 2007.  Flu Vaccine Consent Questions    Do you have a history of severe allergic reactions to this vaccine? no    Any prior history of allergic reactions to egg and/or gelatin? no    Do you have a sensitivity to the preservative Thimersol? no    Do you have a past history of Guillan-Barre Syndrome? no    Do you currently have an acute febrile illness? no    Have you ever had a severe reaction to latex? no    Vaccine information given and explained to patient? yes    Are you currently pregnant? no

## 2010-10-03 NOTE — Progress Notes (Signed)
 Summary: PT fell at home and wante to be advised  Phone Note Call from Patient Call back at Home Phone [phone]   Caller: Patient Summary of Call: Pt called and stated that about 20 minutes earlier she had fallen forward in her home and hit her mouth, knee and shoulder. She could not straighten her arm it hurt so bad. She called her husband to come home because she needed assistance. She wanted to know if she should come into be seen or go to ER. I spoke to Dr Jimmy and he advised the pt should go directly to ER. Pt was advised. She was not happy because she did not want to have a long wait in the [facility] ER. I told her she could go the the [facility] but she said she is [place] a couple of miles from [facility]. She asked if I thought she should wait a couple of hours. I re-told her what the Dr. said and she said she would go w/her husband to the ER. Initial call taken by: Aleck Cypher,  May 28, 2008 9:15 AM  Follow-up for Phone Call        please check on her to see she made out Follow-up by: Charlie Richmond Jimmy MD,  May 28, 2008 2:54 PM  Additional Follow-up for Phone Call Additional follow up Details #1::        [facility] PATIENT SHE STATES HUMEROUS BONE IS FRACTURE THEY GIVE HER A SHOT AND PUT HER IN A SLING  AND SENT HER HOME WAITING FOR DR. CALIFF  OFFICE TO CALL HER THEY GIVE HER OXYCODONE  5-325 NOT HELPING MUCH Additional Follow-up by: Apolinar Cones,  May 28, 2008 4:04 PM    Additional Follow-up for Phone Call Additional follow up Details #2::    please let her know to try 2 at a time if 1 ineffective--esp when she is trying to go to sleep Let her know I am sorry it happened Charlie Richmond Jimmy MD  May 28, 2008 4:28 PM   Additional Follow-up for Phone Call Additional follow up Details #3:: Details for Additional Follow-up Action Taken: called patient about hte pain pills she states they are making her nauseated  took husbands promethazine 25 mg only has 2 left  uses [facility] [address]   okay #10 x 0 1 three times a day as needed  Charlie Richmond Jimmy MD  May 28, 2008 5:25 PM  Additional Follow-up by: Apolinar Cones,  May 28, 2008 5:22 PM  New/Updated Medications: PROMETHAZINE HCL 25 MG TABS (PROMETHAZINE HCL) 1 three times a day as needed as needed

## 2010-10-03 NOTE — Letter (Signed)
 Summary: [facility] 04/25/09 - 05/18/09  [facility] 04/25/09 - 05/18/09   Imported By: Mliss Shutter 05/25/2009 10:02:55  _____________________________________________________________________  External Attachment:    Type:   Image     Comment:   External Document

## 2010-10-10 ENCOUNTER — Ambulatory Visit: Payer: Self-pay | Admitting: Internal Medicine

## 2010-10-24 ENCOUNTER — Other Ambulatory Visit: Payer: Self-pay | Admitting: Internal Medicine

## 2010-10-24 ENCOUNTER — Ambulatory Visit (INDEPENDENT_AMBULATORY_CARE_PROVIDER_SITE_OTHER): Payer: Medicare Other | Admitting: Internal Medicine

## 2010-10-24 ENCOUNTER — Telehealth (INDEPENDENT_AMBULATORY_CARE_PROVIDER_SITE_OTHER): Payer: Self-pay | Admitting: *Deleted

## 2010-10-24 ENCOUNTER — Encounter: Payer: Self-pay | Admitting: Internal Medicine

## 2010-10-24 DIAGNOSIS — I10 Essential (primary) hypertension: Secondary | ICD-10-CM

## 2010-10-24 DIAGNOSIS — K219 Gastro-esophageal reflux disease without esophagitis: Secondary | ICD-10-CM

## 2010-10-24 DIAGNOSIS — E039 Hypothyroidism, unspecified: Secondary | ICD-10-CM

## 2010-10-24 DIAGNOSIS — M810 Age-related osteoporosis without current pathological fracture: Secondary | ICD-10-CM

## 2010-10-24 LAB — CBC WITH DIFFERENTIAL/PLATELET
Basophils Absolute: 0 10*3/uL (ref 0.0–0.1)
Basophils Relative: 0.4 % (ref 0.0–3.0)
Eosinophils Absolute: 0.2 10*3/uL (ref 0.0–0.7)
Eosinophils Relative: 2.6 % (ref 0.0–5.0)
HCT: 41.1 % (ref 36.0–46.0)
Hemoglobin: 14.2 g/dL (ref 12.0–15.0)
Lymphocytes Relative: 25.3 % (ref 12.0–46.0)
Lymphs Abs: 2.3 10*3/uL (ref 0.7–4.0)
MCHC: 34.6 g/dL (ref 30.0–36.0)
MCV: 85.9 fl (ref 78.0–100.0)
Monocytes Absolute: 0.5 10*3/uL (ref 0.1–1.0)
Monocytes Relative: 5.7 % (ref 3.0–12.0)
Neutro Abs: 6 10*3/uL (ref 1.4–7.7)
Neutrophils Relative %: 66 % (ref 43.0–77.0)
Platelets: 222 10*3/uL (ref 150.0–400.0)
RBC: 4.78 Mil/uL (ref 3.87–5.11)
RDW: 12.8 % (ref 11.5–14.6)
WBC: 9.1 10*3/uL (ref 4.5–10.5)

## 2010-10-24 LAB — RENAL FUNCTION PANEL
Albumin: 4.3 g/dL (ref 3.5–5.2)
BUN: 21 mg/dL (ref 6–23)
CO2: 31 meq/L (ref 19–32)
Calcium: 10.4 mg/dL (ref 8.4–10.5)
Chloride: 101 meq/L (ref 96–112)
Creatinine, Ser: 0.9 mg/dL (ref 0.4–1.2)
GFR: 70.47 mL/min
Glucose, Bld: 126 mg/dL — ABNORMAL HIGH (ref 70–99)
Phosphorus: 3.7 mg/dL (ref 2.3–4.6)
Potassium: 4.4 meq/L (ref 3.5–5.1)
Sodium: 140 meq/L (ref 135–145)

## 2010-10-24 LAB — HEPATIC FUNCTION PANEL
ALT: 14 U/L (ref 0–35)
AST: 17 U/L (ref 0–37)
Albumin: 4.3 g/dL (ref 3.5–5.2)
Alkaline Phosphatase: 95 U/L (ref 39–117)
Bilirubin, Direct: 0.1 mg/dL (ref 0.0–0.3)
Total Bilirubin: 0.6 mg/dL (ref 0.3–1.2)
Total Protein: 6.9 g/dL (ref 6.0–8.3)

## 2010-10-24 LAB — TSH: TSH: 4.82 u[IU]/mL (ref 0.35–5.50)

## 2010-10-31 NOTE — Progress Notes (Signed)
Summary: Mammogram  Phone Note Outgoing Call   Call placed by: Mervin Hack CMA Duncan Dull),  October 24, 2010 11:08 AM Call placed to: [facility] [phone] Summary of Call: calling to get a copy of pt's mammogram report that was done 03/16/2010, per Dr.Letvak this could have been trouble if the results were not normal. Please do not continue to send pt's there, per Dr.Letvak, he wanted Edwinna Areola to be aware. I told Dr.Letvak that [facility] does not refer patient's there anymore. report on your desk. Initial call taken by: Mervin Hack CMA Duncan Dull),  October 24, 2010 11:10 AM  Follow-up for Phone Call        Please call patient to let her know it was normal and add as clinical update Follow-up by: Cindee Salt MD,  October 24, 2010 11:24 AM  Additional Follow-up for Phone Call Additional follow up Details #1::        spoke with patient and office visit today and added as update. Additional Follow-up by: Mervin Hack CMA Duncan Dull),  October 24, 2010 11:39 AM    Additional Follow-up for Phone Call Additional follow up Details #2::    Mammograms should not be scheduled at [facility] any longer.  Will follow up.  Follow-up by: Clarisa Schools,  October 24, 2010 8:01 PM

## 2010-10-31 NOTE — Assessment & Plan Note (Signed)
Summary: 6 MONTH F/U LFW   Vital Signs:  Patient profile:   70 year old female Weight:      155 pounds Temp:     98.0 degrees F oral Pulse rate:   44 / minute Pulse rhythm:   regular BP sitting:   150 / 60  (left arm) Cuff size:   regular  Vitals Entered By: Mervin Hack CMA Duncan Dull) (October 24, 2010 10:15 AM) CC: follow-up   History of Present Illness: Had mammogram last summer I never got the report--[facility]. Trying to track this down now  Doing well Stays busy but no that active Husband has recurrent lung cancer---spending a lot of time with treatment Mood has been okay  Does check BP at home Usually 130's systolic No headaches No chest pain No SOB no edema  Occ gets substernal indigestion Takes tums  and this helps 1 time a week on average Occ regurgitation after burp at times--this will relieve the symptoms  Allergies: No Known Drug Allergies  Past History:  Past medical, surgical, family and social histories (including risk factors) reviewed for relevance to current acute and chronic problems.  Past Medical History: Reviewed history from 02/21/2009 and no changes required. GERD Hyperlipidemia Hypertension Hypothyroidism Osteoporosis Impaired fasting glucose  Past Surgical History: Reviewed history from 09/12/2009 and no changes required. VD x 2  1991 - Cervical polyps ([place]) 1980's  -  Breast cysts  (aspirated) 9/09  Fx right humerus-no surgery 8/10 Left wrist fracture--plate put in  --- Dr Gerrit Heck  Family History: Reviewed history from 02/06/2007 and no changes required. Father: Died 65 MI, lung cancer Mother: Died 39, HTN, stroke Siblings: 2 brothers alive, NIDDM, HTN CAD--  Dad (due to [place]?), Mat GP died of MI HTN-- Mom, mat. aunt, son DM:  Brothers Strokes in family Pat  uncle died ?melanoma     Pat  aunt died [place]  Social History: Reviewed history from 08/12/2007 and no changes required. Marital Status:  Married Children: 2 Occupation: Tourist information centre manager. Daughter also lives with her now Never Smoked Alcohol use-no Drug use-no  Review of Systems       did have low abdominal pain  ~10 days ago some loose stool then---resolved on its own after a couple of days weight is stable Generally appetite is fine sleeps okay--occ needs to read in middle of night if she goes to bed too early  Physical Exam  General:  alert and normal appearance.   Neck:  supple, no masses, no thyromegaly, no carotid bruits, and no cervical lymphadenopathy.   Lungs:  normal respiratory effort, no intercostal retractions, no accessory muscle use, and normal breath sounds.   Heart:  normal rate, regular rhythm, no murmur, and no gallop.   Abdomen:  soft and non-tender.   Extremities:  no edema Psych:  normally interactive, good eye contact, not anxious appearing, and not depressed appearing.     Impression & Recommendations:  Problem # 1:  HYPERTENSION (ICD-401.9) Assessment Unchanged  reasonable control lots of stress no change for now  Her updated medication list for this problem includes:    Atenolol 25 Mg Tabs (Atenolol) .Marland Kitchen... 1/2  by mouth daily  BP today: 150/60 Prior BP: 148/70 (03/13/2010)  Labs Reviewed: K+: 4.8 (03/13/2010) Creat: : 0.8 (03/13/2010)   Chol: 212 (08/12/2007)   HDL: 35.7 (08/12/2007)   LDL: DEL (08/12/2007)   TG: 276 (08/12/2007)  Orders: TLB-Renal Function Panel (80069-RENAL) TLB-CBC Platelet - w/Differential (85025-CBCD) TLB-Hepatic/Liver Function Pnl (80076-HEPATIC)  TLB-TSH (Thyroid Stimulating Hormone) (84443-TSH) Venipuncture (16109)  Problem # 2:  HYPOTHYROIDISM (ICD-244.9) Assessment: Unchanged clinically euthyroid due for labs  Her updated medication list for this problem includes:    Synthroid 88 Mcg Tabs (Levothyroxine sodium) .Marland Kitchen... 1 by mouth daily   generic  Labs Reviewed: TSH: 5.13 (09/12/2009)    HgBA1c: 6.3 (09/12/2009) Chol: 212 (08/12/2007)    HDL: 35.7 (08/12/2007)   LDL: DEL (08/12/2007)   TG: 276 (08/12/2007)  Problem # 3:  GERD (ICD-530.81) Assessment: Deteriorated occ symptoms discussed using H2blocker instead of tums  Her updated medication list for this problem includes:    Ranitidine Hcl 150 Mg Tabs (Ranitidine hcl) .Marland Kitchen... 1 tab by mouth two times a day as needed for acid reflux  Problem # 4:  OSTEOPOROSIS (ICD-733.00) Assessment: Unchanged on calcium and vitamin D may need to stop if calcium up again  Her updated medication list for this problem includes:    Oscal 500/200 D-3 500-200 Mg-unit Tabs (Calcium-vitamin d) .Marland Kitchen... Take 1 by mouth once daily  Complete Medication List: 1)  Atenolol 25 Mg Tabs (Atenolol) .... 1/2  by mouth daily 2)  Synthroid 88 Mcg Tabs (Levothyroxine sodium) .Marland Kitchen.. 1 by mouth daily   generic 3)  Multivitamins Tabs (Multiple vitamin) .Marland Kitchen.. 1 by mouth daily 4)  Oscal 500/200 D-3 500-200 Mg-unit Tabs (Calcium-vitamin d) .... Take 1 by mouth once daily 5)  Vitamin B-12 1000 Mcg Tabs (Cyanocobalamin) .... Take 1 by mouth once daily 6)  Ranitidine Hcl 150 Mg Tabs (Ranitidine hcl) .Marland Kitchen.. 1 tab by mouth two times a day as needed for acid reflux  Patient Instructions: 1)  Please try the ranitidine for acid symptoms 2)  Please schedule a follow-up appointment in 6 months for physical   Orders Added: 1)  Est. Patient Level IV [60454] 2)  TLB-Renal Function Panel [80069-RENAL] 3)  TLB-CBC Platelet - w/Differential [85025-CBCD] 4)  TLB-Hepatic/Liver Function Pnl [80076-HEPATIC] 5)  TLB-TSH (Thyroid Stimulating Hormone) [84443-TSH] 6)  Venipuncture [09811]    Current Allergies (reviewed today): No known allergies    Preventive Care Screening  Mammogram:    Date:  03/16/2010    Results:  normal

## 2010-11-08 ENCOUNTER — Other Ambulatory Visit: Payer: Self-pay | Admitting: Internal Medicine

## 2010-11-08 ENCOUNTER — Other Ambulatory Visit (INDEPENDENT_AMBULATORY_CARE_PROVIDER_SITE_OTHER): Payer: Medicare Other

## 2010-11-08 ENCOUNTER — Encounter: Payer: Self-pay | Admitting: Internal Medicine

## 2010-11-08 DIAGNOSIS — R7301 Impaired fasting glucose: Secondary | ICD-10-CM

## 2010-11-08 LAB — GLUCOSE, RANDOM: Glucose, Bld: 137 mg/dL — ABNORMAL HIGH (ref 70–99)

## 2010-11-08 LAB — HEMOGLOBIN A1C: Hgb A1c MFr Bld: 6.7 % — ABNORMAL HIGH (ref 4.6–6.5)

## 2010-11-09 DIAGNOSIS — E118 Type 2 diabetes mellitus with unspecified complications: Secondary | ICD-10-CM | POA: Insufficient documentation

## 2010-12-11 ENCOUNTER — Other Ambulatory Visit: Payer: Self-pay | Admitting: Internal Medicine

## 2011-01-24 ENCOUNTER — Other Ambulatory Visit: Payer: Self-pay | Admitting: Internal Medicine

## 2011-04-30 ENCOUNTER — Encounter: Payer: Self-pay | Admitting: Internal Medicine

## 2011-05-02 ENCOUNTER — Encounter: Payer: Self-pay | Admitting: Internal Medicine

## 2011-05-02 ENCOUNTER — Ambulatory Visit (INDEPENDENT_AMBULATORY_CARE_PROVIDER_SITE_OTHER): Payer: Medicare Other | Admitting: Internal Medicine

## 2011-05-02 VITALS — BP 130/55 | HR 47 | Temp 97.5°F | Ht 64.0 in | Wt 153.0 lb

## 2011-05-02 DIAGNOSIS — E785 Hyperlipidemia, unspecified: Secondary | ICD-10-CM

## 2011-05-02 DIAGNOSIS — E119 Type 2 diabetes mellitus without complications: Secondary | ICD-10-CM

## 2011-05-02 DIAGNOSIS — E039 Hypothyroidism, unspecified: Secondary | ICD-10-CM

## 2011-05-02 DIAGNOSIS — Z0000 Encounter for general adult medical examination without abnormal findings: Secondary | ICD-10-CM | POA: Insufficient documentation

## 2011-05-02 DIAGNOSIS — I10 Essential (primary) hypertension: Secondary | ICD-10-CM

## 2011-05-02 LAB — BASIC METABOLIC PANEL WITH GFR
BUN: 26 mg/dL — ABNORMAL HIGH (ref 6–23)
CO2: 28 meq/L (ref 19–32)
Calcium: 9.4 mg/dL (ref 8.4–10.5)
Chloride: 103 meq/L (ref 96–112)
Creatinine, Ser: 0.8 mg/dL (ref 0.4–1.2)
GFR: 71.33 mL/min
Glucose, Bld: 133 mg/dL — ABNORMAL HIGH (ref 70–99)
Potassium: 4.1 meq/L (ref 3.5–5.1)
Sodium: 140 meq/L (ref 135–145)

## 2011-05-02 LAB — CBC WITH DIFFERENTIAL/PLATELET
Basophils Absolute: 0 10*3/uL (ref 0.0–0.1)
Basophils Relative: 0.6 % (ref 0.0–3.0)
Eosinophils Absolute: 0.2 10*3/uL (ref 0.0–0.7)
Eosinophils Relative: 3 % (ref 0.0–5.0)
HCT: 41.2 % (ref 36.0–46.0)
Hemoglobin: 13.9 g/dL (ref 12.0–15.0)
Lymphocytes Relative: 22.2 % (ref 12.0–46.0)
Lymphs Abs: 1.7 10*3/uL (ref 0.7–4.0)
MCHC: 33.9 g/dL (ref 30.0–36.0)
MCV: 86.4 fl (ref 78.0–100.0)
Monocytes Absolute: 0.6 10*3/uL (ref 0.1–1.0)
Monocytes Relative: 7.2 % (ref 3.0–12.0)
Neutro Abs: 5.2 10*3/uL (ref 1.4–7.7)
Neutrophils Relative %: 67 % (ref 43.0–77.0)
Platelets: 160 10*3/uL (ref 150.0–400.0)
RBC: 4.76 Mil/uL (ref 3.87–5.11)
RDW: 13.5 % (ref 11.5–14.6)
WBC: 7.7 10*3/uL (ref 4.5–10.5)

## 2011-05-02 LAB — LIPID PANEL
Cholesterol: 221 mg/dL — ABNORMAL HIGH (ref 0–200)
HDL: 49.6 mg/dL
Total CHOL/HDL Ratio: 4
Triglycerides: 240 mg/dL — ABNORMAL HIGH (ref 0.0–149.0)
VLDL: 48 mg/dL — ABNORMAL HIGH (ref 0.0–40.0)

## 2011-05-02 LAB — HEMOGLOBIN A1C: Hgb A1c MFr Bld: 6.6 % — ABNORMAL HIGH (ref 4.6–6.5)

## 2011-05-02 LAB — HEPATIC FUNCTION PANEL
ALT: 14 U/L (ref 0–35)
AST: 15 U/L (ref 0–37)
Albumin: 4.4 g/dL (ref 3.5–5.2)
Alkaline Phosphatase: 81 U/L (ref 39–117)
Bilirubin, Direct: 0.1 mg/dL (ref 0.0–0.3)
Total Bilirubin: 0.7 mg/dL (ref 0.3–1.2)
Total Protein: 7.2 g/dL (ref 6.0–8.3)

## 2011-05-02 LAB — T4, FREE: Free T4: 1.14 ng/dL (ref 0.60–1.60)

## 2011-05-02 LAB — LDL CHOLESTEROL, DIRECT: Direct LDL: 143.5 mg/dL

## 2011-05-02 LAB — TSH: TSH: 0.99 u[IU]/mL (ref 0.35–5.50)

## 2011-05-02 LAB — MICROALBUMIN / CREATININE URINE RATIO: Creatinine,U: 166.5 mg/dL

## 2011-05-02 MED ORDER — LISINOPRIL 10 MG PO TABS: 10.0000 mg | ORAL_TABLET | Freq: Every day | ORAL | Status: DC

## 2011-05-02 NOTE — Assessment & Plan Note (Signed)
Check labs 

## 2011-05-02 NOTE — Assessment & Plan Note (Signed)
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

## 2011-05-02 NOTE — Assessment & Plan Note (Signed)
Will recheck labs Goal LDL under 100 No meds now as she plans to improve her lifestyle issues Just diagnosed with DM also

## 2011-05-02 NOTE — Patient Instructions (Signed)
Please stop the atenolol Start the lisinopril daily and set up blood work in about 1 month (met b--401.9)

## 2011-05-02 NOTE — Progress Notes (Signed)
Subjective:    Patient ID: [name], female    DOB: Mar 03, 1941, 70 y.o.   MRN: [id number]  HPI Here for Medicare Wellness exam Rx given for zostavax UTD on colon Will do mammograms every 2 years Reviewed advanced directives No fall risks--last fall several years ago Regular exercise with walking and bike  Due for eye exam Mild problems with hearing---relates to cerumen esp in right. Wants this checked  BP has been fine No regular headaches Notes a slow pulse No dizziness or syncope but occ can sense when very slow  Checks sugars every few days Almost all under 140---usually ~120-130 Not totally compliant with diabetic diet with all her stress---husband and brother recently died  No chest pain No SOB No sig edema  Current Outpatient Prescriptions on File Prior to Visit  Medication Sig Dispense Refill  . atenolol (TENORMIN) 25 MG tablet Take 12.5 mg by mouth daily.        . calcium-vitamin D (OSCAL WITH D) 500-200 MG-UNIT per tablet Take 1 tablet by mouth daily.        Marland Kitchen levothyroxine (SYNTHROID, LEVOTHROID) 88 MCG tablet Take 88 mcg by mouth daily.        . Multiple Vitamin (MULTIVITAMIN) tablet Take 1 tablet by mouth daily.        . ranitidine (ZANTAC) 150 MG capsule Take 150 mg by mouth 2 (two) times daily.        . vitamin B-12 (CYANOCOBALAMIN) 1000 MCG tablet Take 1,000 mcg by mouth daily.          No Known Allergies  Past Medical History  Diagnosis Date  . GERD (gastroesophageal reflux disease)   . Hyperlipidemia   . Hypertension   . Thyroid disease   . Osteoporosis   . Diabetes mellitus     Past Surgical History  Procedure Date  . Vaginal delivery   . Cervical polypectomy   . Breast cyst aspiration   . Humerus fracture surgery   . Wrist fracture surgery     Family History  Problem Relation Age of Onset  . Hypertension Mother   . Heart disease Father   . Diabetes Brother   . Hypertension Brother   . Hypertension Son   . Heart disease Maternal  Grandfather     History   Social History  . Marital Status: Widowed    Spouse Name: N/A    Number of Children: 2  . 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 Smoking status: Never Smoker   . Smokeless tobacco: Never Used  . Alcohol Use: No  . Drug Use: No  . Sexually Active: Not on file   Other Topics Concern  . Not on file   Social History Narrative   Widowed in spring of 2012Lost brother alsoHas living willSon and daughter hold health care POAWould accept resuscitation but no prolonged artificial ventilationNot sure about feeding tube   Review of Systems occ heartburn--tums help (perhaps once every 2 weeks) Appetite stable Weight is stable Sleep is variable--lots on her mind. Her nerves act up at times. Lorazepam helped in past Found tick on left thigh several days ago---able to get it off. No symptoms since    Objective:   Physical Exam  Constitutional: She is oriented to person, place, and time. She appears well-developed and well-nourished. No distress.  HENT:  Mouth/Throat: Oropharynx is clear and moist. No oropharyngeal exudate.  Cerumen bilaterally L>R Lavage done  Neck: Normal range of motion. Neck supple. No thyromegaly present.  Cardiovascular: Normal rate, regular rhythm, normal heart sounds and intact distal pulses.  Exam reveals no gallop.   No murmur heard. Pulmonary/Chest: Effort normal and breath sounds normal. No respiratory distress. She has no wheezes. She has no rales.  Abdominal: Soft. She exhibits no mass. There is no tenderness.  Genitourinary:       Breasts have bilateral cystic changes but no worrisome mass  Musculoskeletal: Normal range of motion. She exhibits no edema and no tenderness.  Lymphadenopathy:    She has no cervical adenopathy.    She has no axillary adenopathy.  Neurological: She is alert and oriented to person, place, and time. She exhibits normal muscle  tone.       Normal gait and strength President--"Obama, Bush, his daddy" (didn't remember Clinton) [phone] [id number] Recall 3/3  Skin: Skin is warm. No rash noted.       Slight red area from tick on left distal thigh  Psychiatric: She has a normal mood and affect. Her behavior is normal. Judgment and thought content normal.          Assessment & Plan:

## 2011-05-02 NOTE — Assessment & Plan Note (Signed)
I have personally reviewed the Medicare Annual Wellness questionnaire and have noted 1. The patient's medical and social history 2. Their use of alcohol, tobacco or illicit drugs 3. Their current medications and supplements 4. The patient's functional ability including ADL's, fall risks, home safety risks and hearing or visual             impairment. 5. Diet and physical activities 6. Evidence for depression or mood disorders  The patients weight, height, BMI and visual acuity have been recorded in the chart I have made referrals, counseling and provided education to the patient based review of the above and I have provided the pt with a written personalized care plan for preventive services.  I have provided you with a copy of your personalized plan for preventive services. Please take the time to review along with your updated medication list.  mammo next year Rx for zostavax given Ears flushed---should help hearing She is getting full eye exam soon

## 2011-05-02 NOTE — Assessment & Plan Note (Signed)
Will recheck labs No meds needed yet

## 2011-05-03 ENCOUNTER — Telehealth: Payer: Self-pay | Admitting: *Deleted

## 2011-05-03 MED ORDER — LORAZEPAM 0.5 MG PO TABS: 0.5000 mg | ORAL_TABLET | Freq: Two times a day (BID) | ORAL | Status: DC | PRN

## 2011-05-03 NOTE — Telephone Encounter (Signed)
Pt was seen yesterday and discussed getting a low dose lorazepam script.  She says she thinks you may have forgotten because the two of you were talking about so much and the script wasn't at the pharmacy.  Uses [facility] [address]

## 2011-05-03 NOTE — Telephone Encounter (Signed)
rx sent to pharmacy by e-script Spoke with patient and advised results   

## 2011-05-03 NOTE — Telephone Encounter (Signed)
I did forget---sorry Okay lorazepam 0.5 1/2-1 bid prn #60 x 0

## 2011-06-04 ENCOUNTER — Other Ambulatory Visit (INDEPENDENT_AMBULATORY_CARE_PROVIDER_SITE_OTHER): Payer: Medicare Other

## 2011-06-04 DIAGNOSIS — I10 Essential (primary) hypertension: Secondary | ICD-10-CM

## 2011-06-04 LAB — BASIC METABOLIC PANEL WITH GFR
BUN: 25 mg/dL — ABNORMAL HIGH (ref 6–23)
CO2: 29 meq/L (ref 19–32)
Calcium: 9.8 mg/dL (ref 8.4–10.5)
Chloride: 100 meq/L (ref 96–112)
Creatinine, Ser: 0.9 mg/dL (ref 0.4–1.2)
GFR: 65.85 mL/min
Glucose, Bld: 139 mg/dL — ABNORMAL HIGH (ref 70–99)
Potassium: 4.7 meq/L (ref 3.5–5.1)
Sodium: 139 meq/L (ref 135–145)

## 2011-11-02 ENCOUNTER — Ambulatory Visit: Payer: Medicare Other | Admitting: Internal Medicine

## 2011-11-19 ENCOUNTER — Encounter: Payer: Self-pay | Admitting: Internal Medicine

## 2011-11-19 ENCOUNTER — Ambulatory Visit (INDEPENDENT_AMBULATORY_CARE_PROVIDER_SITE_OTHER): Payer: Medicare Other | Admitting: Internal Medicine

## 2011-11-19 VITALS — BP 154/70 | HR 65 | Temp 98.2°F | Resp 16 | Ht 63.5 in | Wt 158.2 lb

## 2011-11-19 DIAGNOSIS — I498 Other specified cardiac arrhythmias: Secondary | ICD-10-CM

## 2011-11-19 DIAGNOSIS — M810 Age-related osteoporosis without current pathological fracture: Secondary | ICD-10-CM

## 2011-11-19 DIAGNOSIS — I10 Essential (primary) hypertension: Secondary | ICD-10-CM

## 2011-11-19 DIAGNOSIS — E785 Hyperlipidemia, unspecified: Secondary | ICD-10-CM

## 2011-11-19 DIAGNOSIS — E039 Hypothyroidism, unspecified: Secondary | ICD-10-CM

## 2011-11-19 DIAGNOSIS — E119 Type 2 diabetes mellitus without complications: Secondary | ICD-10-CM

## 2011-11-19 DIAGNOSIS — Z124 Encounter for screening for malignant neoplasm of cervix: Secondary | ICD-10-CM | POA: Insufficient documentation

## 2011-11-19 LAB — MICROALBUMIN / CREATININE URINE RATIO: Microalb, Ur: 0.7 mg/dL (ref 0.0–1.9)

## 2011-11-19 LAB — HEMOGLOBIN A1C: Hgb A1c MFr Bld: 6.8 % — ABNORMAL HIGH (ref 4.6–6.5)

## 2011-11-19 LAB — TSH: TSH: 14.47 u[IU]/mL — ABNORMAL HIGH (ref 0.35–5.50)

## 2011-11-19 MED ORDER — LOSARTAN POTASSIUM 50 MG PO TABS: 50.0000 mg | ORAL_TABLET | Freq: Every day | ORAL | Status: DC

## 2011-11-19 MED ORDER — GLUCOSE BLOOD VI STRP: ORAL_STRIP | Status: AC

## 2011-11-19 NOTE — Assessment & Plan Note (Signed)
Needs to return for PAP smear.  Has not had one since the 1980's despite having a uterus.

## 2011-11-19 NOTE — Assessment & Plan Note (Addendum)
Managed with diet and exercise ,  Needs labs today.  checks sugars one daily

## 2011-11-19 NOTE — Assessment & Plan Note (Signed)
With history of mildly elevated triglycerides. Low glycemic index diet recommended, repeat lipids prior to annual PE

## 2011-11-19 NOTE — Progress Notes (Signed)
Patient ID: [name], female   DOB: 1940/10/01, 71 y.o.   MRN: [id number] Patient Active Problem List  Diagnoses  . HYPOTHYROIDISM  . HYPERLIPIDEMIA  . HYPERCALCEMIA  . HYPERTENSION  . BRADYCARDIA  . GERD  . OSTEOPOROSIS  . DIABETES MELLITUS, TYPE II  . Routine general medical examination at a health care facility  . Screening for cervical cancer    Subjective:  CC:   Chief Complaint  Patient presents with  . New Patient    HPI:   [name] is a 71 y.o. female who presents with a need for a new doctor.  She has been having mild weight gain for the past several months due to inactivity and nonadherence to a low carbohydrate diet.  She was widowed during the past year and has lost several other family members as well, so her previous level of exercise has been altered.  She is recovering well from her losses.   She has reflux but her symptoms are well controlled with medication.  She has occasional low back pain and joint pain that is managed with occasional NSAIDs .  She has no trouble sleeping . She is looking forward to getting back to her former activity level and exercising.  Past Medical History  Diagnosis Date  . GERD (gastroesophageal reflux disease)   . Hyperlipidemia   . Hypertension   . Thyroid disease   . Osteoporosis   . Diabetes mellitus     Past Surgical History  Procedure Date  . Vaginal delivery   . Cervical polypectomy   . Breast cyst aspiration   . Humerus fracture surgery   . Wrist fracture surgery          The following portions of the patient's history were reviewed and updated as appropriate: Allergies, current medications, and problem list.    Review of Systems:   12 Pt  review of systems was negative except those addressed in the HPI,     History   Social History  . Marital Status: Widowed    Spouse Name: N/A    Number of Children: 2  . 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 Smoking status: Never Smoker   . Smokeless tobacco: Never Used  . Alcohol Use: No  . Drug Use: No  . Sexually Active: Not on file   Other Topics Concern  . Not on file   Social History Narrative   Widowed in spring of 2012Lost brother alsoHas living willSon and daughter hold health care POAWould accept resuscitation but no prolonged artificial ventilationNot sure about feeding tube    Objective:  BP 154/70  Pulse 65  Temp(Src) 98.2 F (36.8 C) (Oral)  Resp 16  Ht 5' 3.5" (1.613 m)  Wt 158 lb 4 oz (71.782 kg)  BMI 27.59 kg/m2  SpO2 100%  General appearance: alert, cooperative and appears stated age Ears: normal TM's and external ear canals both ears Throat: lips, mucosa, and tongue normal; teeth and gums normal Neck: no adenopathy, no carotid bruit, supple, symmetrical, trachea midline and thyroid not enlarged, symmetric, no tenderness/mass/nodules Back: symmetric, no curvature. ROM normal. No CVA tenderness. Lungs: clear to auscultation bilaterally Heart: regular rate and rhythm, S1, S2 normal, no murmur, click, rub or gallop Abdomen: soft, non-tender; bowel sounds normal; no masses,  no organomegaly Pulses: 2+ and symmetric Skin: Skin color, texture, turgor normal. No rashes or lesions Lymph nodes: Cervical, supraclavicular,  and axillary nodes normal.  Assessment and Plan:  HYPOTHYROIDISM Her thyroid medication was stopped in August by Dr. Sonny Dandy for unclear reasons.  We will repeat TSH today before resuming meds  HYPERTENSION Stopped newly prescribed lisinopril in October secondary to cough.  Atenolol  12.5 mg once daily  Not controlling her bp.  Changes today to losartan   DIABETES MELLITUS, TYPE II Managed with diet and exercise ,  Needs labs today.  checks sugars one daily  Screening for cervical cancer Needs to return for PAP smear.  Has not had one since the 1980's despite having a uterus.   BRADYCARDIA Not sure why she is  taking atenolol.  At 12.5 mg daily it is not providing bp control and will be stopped,  OSTEOPOROSIS By prior DEXA,  No prior fractures.  She has been taking calcium and vitamin D  HYPERLIPIDEMIA With history of mildly elevated triglycerides. Low glycemic index diet recommended, repeat lipids prior to annual PE    Updated Medication List Outpatient Encounter Prescriptions as of 11/19/2011  Medication Sig Dispense Refill  . calcium-vitamin D (OSCAL WITH D) 500-200 MG-UNIT per tablet Take 1 tablet by mouth daily.        Marland Kitchen LORazepam (ATIVAN) 0.5 MG tablet Take 1-2 tablets (0.5-1 mg total) by mouth 2 (two) times daily as needed.  60 tablet  0  . Multiple Vitamin (MULTIVITAMIN) tablet Take 1 tablet by mouth daily.        Marland Kitchen DISCONTD: atenolol (TENORMIN) 25 MG tablet Take 25 mg by mouth daily.      Marland Kitchen glucose blood (BAYER CONTOUR NEXT TEST) test strip Use as instructed  100 each  12  . losartan (COZAAR) 50 MG tablet Take 1 tablet (50 mg total) by mouth daily.  90 tablet  3  . DISCONTD: levothyroxine (SYNTHROID, LEVOTHROID) 88 MCG tablet Take 88 mcg by mouth daily.        Marland Kitchen DISCONTD: lisinopril (PRINIVIL,ZESTRIL) 10 MG tablet Take 1 tablet (10 mg total) by mouth daily.  90 tablet  3  . DISCONTD: ranitidine (ZANTAC) 150 MG capsule Take 150 mg by mouth 2 (two) times daily.        Marland Kitchen DISCONTD: vitamin B-12 (CYANOCOBALAMIN) 1000 MCG tablet Take 1,000 mcg by mouth daily.           Orders Placed This Encounter  Procedures  . TSH  . COMPLETE METABOLIC PANEL WITH GFR  . Microalbumin / creatinine urine ratio  . Hemoglobin A1c    Return in about 3 months (around 02/19/2012).

## 2011-11-19 NOTE — Patient Instructions (Signed)
Consider reading about the Low Glycemic Index Diet and eating 6 smaller meals daily to manage your diabetes and your weight .  Look for the low carb breads to substitute for your bread needs.  Avoid cereal and oatmeal because they are high in sugar  Here is what Dr. Darrick Huntsman eats every day:    7 AM Low carbohydrate Protein  Shakes (EAS Carb Control  Or Atkins ,  Available everywhere,   In  cases at [employer]Js )  2.5 carbs  (Add or substitute a toasted sandwhich thin w/ peanut butter)  10 AM: Protein bar by Atkins (snack size,  Chocolate lover's variety at  [employer])    Lunch: sandwich on pita bread or flatbread (Joseph's makes a pita bread and a flat bread , available at [employer] and [employer]; Toufayah makes a low carb flatbread available at [employer] and [employer])   3 PM:  Mid day :  Another protein bar,  Or a  cheese stick, 1/4 cup of almonds, walnuts, pistachios, pecans, peanuts,  Macadamia nuts  6 PM  Dinner:  "mean and green:"  Meat/chicken/fish (must not be breaded), salad, and green veggie : use ranch, vinagrette,  Blue cheese, etc  9 PM snack : Breyer's low carb fudgiscle or  ice cream bar (Carb Smart) Weight Watcher's ice cream bar , or another protein shake  ---------------------------------------------------------------------------------------------------------------------------------------------------------------  We are stopping atenolol and starting losartan 50 mg daily for your blood pressure.

## 2011-11-19 NOTE — Assessment & Plan Note (Addendum)
By prior DEXA,  No prior fractures.  She has been taking calcium and vitamin D

## 2011-11-19 NOTE — Assessment & Plan Note (Signed)
Not sure why she is taking atenolol.  At 12.5 mg daily it is not providing bp control and will be stopped,

## 2011-11-19 NOTE — Assessment & Plan Note (Addendum)
Stopped newly prescribed lisinopril in October secondary to cough.  Atenolol  12.5 mg once daily  Not controlling her bp.  Changes today to losartan

## 2011-11-19 NOTE — Assessment & Plan Note (Signed)
Her thyroid medication was stopped in August by Dr. Sonny Dandy for unclear reasons.  We will repeat TSH today before resuming meds

## 2011-11-20 LAB — COMPLETE METABOLIC PANEL WITHOUT GFR
ALT: 15 U/L (ref 0–35)
AST: 24 U/L (ref 0–37)
Albumin: 5.1 g/dL (ref 3.5–5.2)
Alkaline Phosphatase: 81 U/L (ref 39–117)
BUN: 23 mg/dL (ref 6–23)
CO2: 27 meq/L (ref 19–32)
Calcium: 11.3 mg/dL — ABNORMAL HIGH (ref 8.4–10.5)
Chloride: 98 meq/L (ref 96–112)
Creat: 0.83 mg/dL (ref 0.50–1.10)
GFR, Est African American: 83 mL/min
GFR, Est Non African American: 72 mL/min
Glucose, Bld: 111 mg/dL — ABNORMAL HIGH (ref 70–99)
Potassium: 4.9 meq/L (ref 3.5–5.3)
Sodium: 142 meq/L (ref 135–145)
Total Bilirubin: 0.9 mg/dL (ref 0.3–1.2)
Total Protein: 7.3 g/dL (ref 6.0–8.3)

## 2011-11-21 ENCOUNTER — Other Ambulatory Visit: Payer: Self-pay | Admitting: *Deleted

## 2011-11-21 MED ORDER — LEVOTHYROXINE SODIUM 50 MCG PO TABS: 50.0000 ug | ORAL_TABLET | Freq: Every day | ORAL | Status: DC

## 2012-01-07 ENCOUNTER — Other Ambulatory Visit (INDEPENDENT_AMBULATORY_CARE_PROVIDER_SITE_OTHER): Payer: Medicare Other | Admitting: *Deleted

## 2012-01-07 DIAGNOSIS — E079 Disorder of thyroid, unspecified: Secondary | ICD-10-CM

## 2012-01-07 LAB — TSH: TSH: 9.68 u[IU]/mL — ABNORMAL HIGH (ref 0.35–5.50)

## 2012-01-08 MED ORDER — LEVOTHYROXINE SODIUM 75 MCG PO TABS: 75.0000 ug | ORAL_TABLET | Freq: Every day | ORAL | Status: DC

## 2012-01-08 NOTE — Progress Notes (Signed)
Addended by: Duncan Dull on: 01/08/2012 05:37 PM   Modules accepted: Orders

## 2012-01-09 ENCOUNTER — Encounter: Payer: Self-pay | Admitting: Internal Medicine

## 2012-02-21 ENCOUNTER — Encounter: Payer: Self-pay | Admitting: Internal Medicine

## 2012-02-21 ENCOUNTER — Other Ambulatory Visit (HOSPITAL_COMMUNITY)
Admission: RE | Admit: 2012-02-21 | Discharge: 2012-02-21 | Disposition: A | Discharge status: Home / Self Care | Payer: Medicare Other | Source: Ambulatory Visit | Attending: Internal Medicine | Admitting: Internal Medicine

## 2012-02-21 ENCOUNTER — Ambulatory Visit (INDEPENDENT_AMBULATORY_CARE_PROVIDER_SITE_OTHER): Payer: Medicare Other | Admitting: Internal Medicine

## 2012-02-21 VITALS — BP 138/66 | HR 60 | Temp 97.8°F | Resp 16 | Ht 63.5 in | Wt 155.5 lb

## 2012-02-21 DIAGNOSIS — E039 Hypothyroidism, unspecified: Secondary | ICD-10-CM

## 2012-02-21 DIAGNOSIS — E119 Type 2 diabetes mellitus without complications: Secondary | ICD-10-CM

## 2012-02-21 DIAGNOSIS — E785 Hyperlipidemia, unspecified: Secondary | ICD-10-CM

## 2012-02-21 DIAGNOSIS — D229 Melanocytic nevi, unspecified: Secondary | ICD-10-CM

## 2012-02-21 DIAGNOSIS — Z1239 Encounter for other screening for malignant neoplasm of breast: Secondary | ICD-10-CM

## 2012-02-21 DIAGNOSIS — D239 Other benign neoplasm of skin, unspecified: Secondary | ICD-10-CM

## 2012-02-21 DIAGNOSIS — Z79899 Other long term (current) drug therapy: Secondary | ICD-10-CM

## 2012-02-21 DIAGNOSIS — Z1159 Encounter for screening for other viral diseases: Secondary | ICD-10-CM | POA: Insufficient documentation

## 2012-02-21 DIAGNOSIS — Z124 Encounter for screening for malignant neoplasm of cervix: Secondary | ICD-10-CM

## 2012-02-21 DIAGNOSIS — Z01419 Encounter for gynecological examination (general) (routine) without abnormal findings: Secondary | ICD-10-CM | POA: Insufficient documentation

## 2012-02-21 NOTE — Patient Instructions (Addendum)
Consider the Low Glycemic Index Diet and 6 smaller meals daily .  This boosts your metabolism and regulates your sugars:   7 AM Low carbohydrate Protein  Shakes (EAS Carb Control  Or Atkins ,  Available everywhere,   In  cases at [employer]Js )  2.5 carbs  (Add or substitute a toasted sandwhich thin w/ peanut butter)  10 AM: Protein bar by Atkins (snack size,  Chocolate lover's variety at  [employer]J's) or a cup of Dannon's Light n Fit greek Yogurt   Lunch: sandwich on pita bread or flatbread (Joseph's makes a pita bread and a flat bread , available at [employer] and [employer]; Toufayah makes a low carb flatbread available at [employer] and [employer]) Mission makes a low carb whole wheat tortilla available at [employer] most grocery stores   3 PM:  Mid day :  Another protein bar,  Or a  cheese stick, 1/4 cup of almonds, walnuts, pistachios, pecans, peanuts,  Macadamia nuts  6 PM  Dinner:  "mean and green:"  Meat/chicken/fish, salad, and green veggie : use ranch, vinagrette,  Blue cheese, etc  9 PM snack : Breyer's low carb fudgsicle or  ice cream bar (Carb Smart), or  Weight Watcher's ice cream bar , or another protein shake Y

## 2012-02-24 ENCOUNTER — Encounter: Payer: Self-pay | Admitting: Internal Medicine

## 2012-02-24 NOTE — Progress Notes (Signed)
Patient ID: [name], female   DOB: [birth year], 71 y.o.   MRN: [id number] The patient is here for annual Medicare wellness examination and management of other chronic and acute problems.   The risk factors are reflected in the social history.  The roster of all physicians providing medical care to patient - is listed in the Snapshot section of the chart.  Activities of daily living:  The patient is 100% independent in all ADLs: dressing, toileting, feeding as well as independent mobility  Home safety : The patient has smoke detectors in the home. They wear seatbelts.  There are no firearms at home. There is no violence in the home.   There is no risks for hepatitis, STDs or HIV. There is no   history of blood transfusion. They have no travel history to infectious disease endemic areas of the world.  The patient has seen their dentist in the last six month. They have seen their eye doctor in the last year. They admit to slight hearing difficulty with regard to whispered voices and some television programs.  They have deferred audiologic testing in the last year.  They do not  have excessive sun exposure. Discussed the need for sun protection: hats, long sleeves and use of sunscreen if there is significant sun exposure.   Diet: the importance of a healthy diet is discussed. They do have a healthy diet.  The benefits of regular aerobic exercise were discussed. She walks 4 times per week ,  20 minutes.   Depression screen: there are no signs or vegative symptoms of depression- irritability, change in appetite, anhedonia, sadness/tearfullness.  Cognitive assessment: the patient manages all their financial and personal affairs and is actively engaged. They could relate day,date,year and events; recalled 2/3 objects at 3 minutes; performed clock-face test normally.  The following portions of the patient's history were reviewed and updated as appropriate: allergies, current medications, past family  history, past medical history,  past surgical history, past social history  and problem list.  Visual acuity was not assessed per patient preference since she has regular follow up with her ophthalmologist. Hearing and body mass index were assessed and reviewed.   During the course of the visit the patient was educated and counseled about appropriate screening and preventive services including : fall prevention , diabetes screening, nutrition counseling, colorectal cancer screening, and recommended immunizations.    Physical Exam:  BP 138/66  Pulse 60  Temp 97.8 F (36.6 C) (Oral)  Resp 16  Ht 5' 3.5" (1.613 m)  Wt 155 lb 8 oz (70.534 kg)  BMI 27.11 kg/m2  SpO2 97%  General Appearance:    Alert, cooperative, no distress, appears stated age  Head:    Normocephalic, without obvious abnormality, atraumatic  Eyes:    PERRL, conjunctiva/corneas clear, EOM's intact, fundi    benign, both eyes  Ears:    Normal TM's and external ear canals, both ears  Nose:   Nares normal, septum midline, mucosa normal, no drainage    or sinus tenderness  Throat:   Lips, mucosa, and tongue normal; teeth and gums normal  Neck:   Supple, symmetrical, trachea midline, no adenopathy;    thyroid:  no enlargement/tenderness/nodules; no carotid   bruit or JVD  Back:     Symmetric, no curvature, ROM normal, no CVA tenderness  Lungs:     Clear to auscultation bilaterally, respirations unlabored  Chest Wall:    No tenderness or deformity   Heart:    Regular  rate and rhythm, S1 and S2 normal, no murmur, rub   or gallop  Breast Exam:    No tenderness, masses, or nipple abnormality  Abdomen:     Soft, non-tender, bowel sounds active all four quadrants,    no masses, no organomegaly  Genitalia:    Normal female without lesion, discharge or tenderness     Extremities:   Extremities normal, atraumatic, no cyanosis or edema  Pulses:   2+ and symmetric all extremities  Skin:   Skin color, texture, turgor normal, no  rashes or lesions  Lymph nodes:   Cervical, supraclavicular, and axillary nodes normal   Assessment and Plan:  HYPOTHYROIDISM Thyroid was underactive last month and dose was increased to 75 mcg generic synthroid.,  Repeat TSH is due end of June  DIABETES MELLITUS, TYPE II  Controlled,  A1c is < 7.0 Low glycemic index diet reviewed,  No proteinuria,  Foot exam normal   HYPERLIPIDEMIA LDl and triglycerides were addressed at last visit with low GI diet recommendations.  Repeat due    Updated Medication List Outpatient Encounter Prescriptions as of 02/21/2012  Medication Sig Dispense Refill  . calcium-vitamin D (OSCAL WITH D) 500-200 MG-UNIT per tablet Take 1 tablet by mouth daily.        Marland Kitchen glucose blood (BAYER CONTOUR NEXT TEST) test strip Use as instructed  100 each  12  . levothyroxine (SYNTHROID, LEVOTHROID) 75 MCG tablet Take 1 tablet (75 mcg total) by mouth daily.  90 tablet  3  . losartan (COZAAR) 50 MG tablet Take 1 tablet (50 mg total) by mouth daily.  90 tablet  3  . Multiple Vitamin (MULTIVITAMIN) tablet Take 1 tablet by mouth daily.        Marland Kitchen DISCONTD: LORazepam (ATIVAN) 0.5 MG tablet Take 1-2 tablets (0.5-1 mg total) by mouth 2 (two) times daily as needed.  60 tablet  0

## 2012-02-24 NOTE — Assessment & Plan Note (Signed)
Thyroid was underactive last month and dose was increased to 75 mcg generic synthroid.,  Repeat TSH is due end of June

## 2012-02-24 NOTE — Assessment & Plan Note (Signed)
Controlled,  A1c is < 7.0 Low glycemic index diet reviewed,  No proteinuria,  Foot exam normal

## 2012-02-24 NOTE — Assessment & Plan Note (Signed)
LDl and triglycerides were addressed at last visit with low GI diet recommendations.  Repeat due

## 2012-02-25 ENCOUNTER — Other Ambulatory Visit (INDEPENDENT_AMBULATORY_CARE_PROVIDER_SITE_OTHER): Payer: Medicare Other | Admitting: *Deleted

## 2012-02-25 DIAGNOSIS — E079 Disorder of thyroid, unspecified: Secondary | ICD-10-CM

## 2012-02-25 DIAGNOSIS — E119 Type 2 diabetes mellitus without complications: Secondary | ICD-10-CM

## 2012-02-25 DIAGNOSIS — E039 Hypothyroidism, unspecified: Secondary | ICD-10-CM

## 2012-02-25 DIAGNOSIS — Z79899 Other long term (current) drug therapy: Secondary | ICD-10-CM

## 2012-02-25 DIAGNOSIS — E785 Hyperlipidemia, unspecified: Secondary | ICD-10-CM

## 2012-02-25 LAB — LIPID PANEL
Cholesterol: 214 mg/dL — ABNORMAL HIGH (ref 0–200)
HDL: 48.4 mg/dL
Total CHOL/HDL Ratio: 4
Triglycerides: 225 mg/dL — ABNORMAL HIGH (ref 0.0–149.0)
VLDL: 45 mg/dL — ABNORMAL HIGH (ref 0.0–40.0)

## 2012-02-25 LAB — LDL CHOLESTEROL, DIRECT: Direct LDL: 131.1 mg/dL

## 2012-02-25 LAB — HEMOGLOBIN A1C: Hgb A1c MFr Bld: 6.6 % — ABNORMAL HIGH (ref 4.6–6.5)

## 2012-02-25 LAB — TSH: TSH: 3.96 u[IU]/mL (ref 0.35–5.50)

## 2012-02-26 LAB — COMPLETE METABOLIC PANEL WITHOUT GFR
ALT: 13 U/L (ref 0–35)
AST: 14 U/L (ref 0–37)
Albumin: 4.6 g/dL (ref 3.5–5.2)
Alkaline Phosphatase: 90 U/L (ref 39–117)
BUN: 21 mg/dL (ref 6–23)
CO2: 26 meq/L (ref 19–32)
Calcium: 9.8 mg/dL (ref 8.4–10.5)
Chloride: 104 meq/L (ref 96–112)
Creat: 0.79 mg/dL (ref 0.50–1.10)
GFR, Est African American: 88 mL/min
GFR, Est Non African American: 76 mL/min
Glucose, Bld: 137 mg/dL — ABNORMAL HIGH (ref 70–99)
Potassium: 5 meq/L (ref 3.5–5.3)
Sodium: 141 meq/L (ref 135–145)
Total Bilirubin: 0.4 mg/dL (ref 0.3–1.2)
Total Protein: 6.6 g/dL (ref 6.0–8.3)

## 2012-02-26 LAB — TSH: TSH: 3.86 u[IU]/mL (ref 0.35–5.50)

## 2012-03-25 ENCOUNTER — Encounter: Payer: Self-pay | Admitting: Internal Medicine

## 2012-03-25 LAB — HM MAMMOGRAPHY: HM Mammogram: NORMAL

## 2013-01-21 ENCOUNTER — Telehealth: Payer: Self-pay | Admitting: Internal Medicine

## 2013-01-21 DIAGNOSIS — E039 Hypothyroidism, unspecified: Secondary | ICD-10-CM

## 2013-01-21 DIAGNOSIS — E785 Hyperlipidemia, unspecified: Secondary | ICD-10-CM

## 2013-01-21 DIAGNOSIS — E119 Type 2 diabetes mellitus without complications: Secondary | ICD-10-CM

## 2013-01-21 DIAGNOSIS — R5381 Other malaise: Secondary | ICD-10-CM

## 2013-01-21 NOTE — Telephone Encounter (Signed)
She hasn't been seen in 11 months and has several chronic medical issues including diet controlled  that may affect her ability  To metabolize any sedating medications I might prescribe for the situation.  It is too late to get her in for an appt .  I will prescribe something only if she will return tomorrow for a fasting lab visit so I can make sure her kidney function and liver function etc are normal.  Also she needs to make an appt for an office visit  in June for follow up on diabetes and hypothyroid.  NOT HER ANNUAL PHYSICAL. That will have to be postponed until September.

## 2013-01-21 NOTE — Telephone Encounter (Signed)
It will not be billed as a physical because she has not been seen for diabetes and regular follow up since her last physical.  She needs to see me more than once a year as a diabetic; annual physical will be postponed until sept

## 2013-01-21 NOTE — Telephone Encounter (Signed)
Scheduled patient for fasting labs tomorrow and she already has an appointment for 02/24/13 for CPE Advised patient this might be changed to a follow up since she is not due til September for CPE please advise.

## 2013-01-21 NOTE — Telephone Encounter (Signed)
Last OV 02/21/12

## 2013-01-21 NOTE — Telephone Encounter (Signed)
Pt came in today wanting to know if dr Darrick Huntsman would prescribe her something for anxiety she stated she needs to fly.  Her grandson is graduating from [employer] in [place]  And she is petrified to fly Please advise [facility]  [phone]    Pt stated she will be leaving early Monday morning  Pt cell phone # [phone]

## 2013-01-22 ENCOUNTER — Other Ambulatory Visit (INDEPENDENT_AMBULATORY_CARE_PROVIDER_SITE_OTHER): Payer: Medicare Other

## 2013-01-22 DIAGNOSIS — E785 Hyperlipidemia, unspecified: Secondary | ICD-10-CM

## 2013-01-22 DIAGNOSIS — R5381 Other malaise: Secondary | ICD-10-CM

## 2013-01-22 DIAGNOSIS — R5383 Other fatigue: Secondary | ICD-10-CM

## 2013-01-22 DIAGNOSIS — E119 Type 2 diabetes mellitus without complications: Secondary | ICD-10-CM

## 2013-01-22 DIAGNOSIS — E039 Hypothyroidism, unspecified: Secondary | ICD-10-CM

## 2013-01-22 LAB — CBC WITH DIFFERENTIAL/PLATELET
Basophils Absolute: 0.1 10*3/uL (ref 0.0–0.1)
Basophils Relative: 0.7 % (ref 0.0–3.0)
Eosinophils Absolute: 0.2 10*3/uL (ref 0.0–0.7)
Eosinophils Relative: 2.1 % (ref 0.0–5.0)
HCT: 39.8 % (ref 36.0–46.0)
Hemoglobin: 14 g/dL (ref 12.0–15.0)
Lymphocytes Relative: 24.4 % (ref 12.0–46.0)
Lymphs Abs: 1.8 10*3/uL (ref 0.7–4.0)
MCHC: 35.1 g/dL (ref 30.0–36.0)
MCV: 84.4 fl (ref 78.0–100.0)
Monocytes Absolute: 0.5 10*3/uL (ref 0.1–1.0)
Monocytes Relative: 7.3 % (ref 3.0–12.0)
Neutro Abs: 4.7 10*3/uL (ref 1.4–7.7)
Neutrophils Relative %: 65.5 % (ref 43.0–77.0)
Platelets: 160 10*3/uL (ref 150.0–400.0)
RBC: 4.71 Mil/uL (ref 3.87–5.11)
RDW: 13 % (ref 11.5–14.6)
WBC: 7.2 10*3/uL (ref 4.5–10.5)

## 2013-01-22 LAB — COMPREHENSIVE METABOLIC PANEL WITH GFR
ALT: 16 U/L (ref 0–35)
AST: 17 U/L (ref 0–37)
Albumin: 4.2 g/dL (ref 3.5–5.2)
Alkaline Phosphatase: 90 U/L (ref 39–117)
BUN: 18 mg/dL (ref 6–23)
CO2: 25 meq/L (ref 19–32)
Calcium: 9.4 mg/dL (ref 8.4–10.5)
Chloride: 103 meq/L (ref 96–112)
Creatinine, Ser: 0.7 mg/dL (ref 0.4–1.2)
GFR: 87.59 mL/min
Glucose, Bld: 150 mg/dL — ABNORMAL HIGH (ref 70–99)
Potassium: 3.9 meq/L (ref 3.5–5.1)
Sodium: 136 meq/L (ref 135–145)
Total Bilirubin: 1 mg/dL (ref 0.3–1.2)
Total Protein: 6.9 g/dL (ref 6.0–8.3)

## 2013-01-22 LAB — LIPID PANEL
Cholesterol: 220 mg/dL — ABNORMAL HIGH (ref 0–200)
HDL: 40.5 mg/dL
Total CHOL/HDL Ratio: 5
Triglycerides: 304 mg/dL — ABNORMAL HIGH (ref 0.0–149.0)
VLDL: 60.8 mg/dL — ABNORMAL HIGH (ref 0.0–40.0)

## 2013-01-22 LAB — LDL CHOLESTEROL, DIRECT: Direct LDL: 125.8 mg/dL

## 2013-01-22 LAB — TSH: TSH: 4.73 u[IU]/mL (ref 0.35–5.50)

## 2013-01-22 LAB — HEMOGLOBIN A1C: Hgb A1c MFr Bld: 7.8 % — ABNORMAL HIGH (ref 4.6–6.5)

## 2013-01-22 MED ORDER — ALPRAZOLAM 0.25 MG PO TABS: 0.2500 mg | ORAL_TABLET | Freq: Four times a day (QID) | ORAL | Status: DC | PRN

## 2013-01-22 NOTE — Telephone Encounter (Signed)
Patient notified as requested, as to change in appointment type.

## 2013-01-22 NOTE — Addendum Note (Signed)
Addended by: Sherlene Shams on: 01/22/2013 09:45 PM   Modules accepted: Orders

## 2013-01-23 NOTE — Telephone Encounter (Signed)
Faxed script

## 2013-01-24 LAB — HM DIABETES EYE EXAM: HM Diabetic Eye Exam: NORMAL

## 2013-02-13 ENCOUNTER — Other Ambulatory Visit: Payer: Self-pay | Admitting: *Deleted

## 2013-02-13 MED ORDER — LEVOTHYROXINE SODIUM 75 MCG PO TABS: 75.0000 ug | ORAL_TABLET | Freq: Every day | ORAL | Status: DC

## 2013-02-13 NOTE — Telephone Encounter (Signed)
Appointment 02/24/13.

## 2013-02-24 ENCOUNTER — Encounter: Payer: Self-pay | Admitting: Internal Medicine

## 2013-02-24 ENCOUNTER — Ambulatory Visit (INDEPENDENT_AMBULATORY_CARE_PROVIDER_SITE_OTHER): Payer: Medicare Other | Admitting: Internal Medicine

## 2013-02-24 ENCOUNTER — Other Ambulatory Visit (INDEPENDENT_AMBULATORY_CARE_PROVIDER_SITE_OTHER): Payer: Medicare Other

## 2013-02-24 VITALS — BP 152/78 | HR 70 | Temp 98.4°F | Resp 16 | Wt 152.2 lb

## 2013-02-24 DIAGNOSIS — E119 Type 2 diabetes mellitus without complications: Secondary | ICD-10-CM

## 2013-02-24 DIAGNOSIS — R350 Frequency of micturition: Secondary | ICD-10-CM

## 2013-02-24 DIAGNOSIS — E785 Hyperlipidemia, unspecified: Secondary | ICD-10-CM

## 2013-02-24 DIAGNOSIS — I10 Essential (primary) hypertension: Secondary | ICD-10-CM

## 2013-02-24 LAB — URINALYSIS, ROUTINE W REFLEX MICROSCOPIC
Bilirubin Urine: NEGATIVE
Ketones, ur: NEGATIVE
Leukocytes, UA: NEGATIVE
Nitrite: NEGATIVE
Specific Gravity, Urine: 1.025
Total Protein, Urine: NEGATIVE
Urine Glucose: NEGATIVE
Urobilinogen, UA: 0.2
pH: 6 (ref 5.0–8.0)

## 2013-02-24 LAB — HM DIABETES FOOT EXAM: HM Diabetic Foot Exam: NORMAL

## 2013-02-24 MED ORDER — LOSARTAN POTASSIUM 50 MG PO TABS: 50.0000 mg | ORAL_TABLET | Freq: Every day | ORAL | Status: DC

## 2013-02-24 MED ORDER — GLUCOSE BLOOD VI STRP: ORAL_STRIP | Status: DC

## 2013-02-24 NOTE — Assessment & Plan Note (Signed)
LDL is 124 and trigs are 300.  Will repeat in 3 months  After low GI diet trial and start therapy if not under control.

## 2013-02-24 NOTE — Patient Instructions (Addendum)
Check with [facility] about which glucometer is covered and will have the cheaper strips   Once you get it let me know so I will send a rx toi [facility]  You can check blood sugars once daily  At different times 1) fasting (before breakfast)  2) 2 hours after a meal   Your blood pressure is elevated today; check it at home and at [facility].  If numbers are >  130/80 , we will adjust your medications    Your A1c is up to 7.8.  You definitely have diabetes and it is not controlled.   This is  my version of a  "Low GI"  Diet:  It is not ultra low carb, but will still lower your blood sugars and allow you to lose 5 to 10 lbs per month if you follow it carefully. All of the foods can be found at grocery stores and in bulk at [employer].  The Atkins protein bars and shakes are available in more varieties at [employer], [employer] and [employer].    Try to make your dinners < 30 carbs and your snacks < 10  carbs (don't forget to subtract fiber carbs)   If your A1c is not < 7.0 in 3 months,  We will need to add medication.   7 AM Breakfast:  Low carbohydrate Protein  Shakes (I recommend the EAS AdvantEdge "Carb Control" shakes  Or the low carb shakes by Atkins.   Both are available everywhere:  In  cases at [employer]Js  Or in 4 packs at grocery stores and pharmacies  2.5 carbs  (Alternative is  a toasted Arnold's Sandwhich Thin w/ peanut butter, a "Bagel Thin" with cream cheese and salmon) or  a scrambled egg burrito made with a low carb tortilla .  Avoid cereal and bananas, oatmeal too unless you are cooking the old fashioned kind that takes 30-40 minutes to prepare.  the rest is overly processed, has minimal fiber, and is loaded with carbohydrates!   10 AM: Protein bar by Atkins (the snack size, under 200 cal).  There are many varieties , available widely again or in bulk in limited varieties at [employer]Js)  Other so called "protein bars" tend to be loaded with carbohydrates.  Remember, in food advertising, the word "energy" is  synonymous for " carbohydrate."  Lunch: sandwich of [place], (or any lunchmeat, grilled meat or canned tuna), fresh avocado, mayonnaise  and cheese on a lower carbohydrate pita bread, flatbread, or tortilla . Ok to use regular mayonnaise. The bread is the only source or carbohydrate that can be decreased (Joseph's makes a pita bread and a flat bread that are 50 cal and 4 net carbs ; Toufayan makes a low carb flatbread that's 100 cal and 9 net carbs  and  Mission makes a low carb whole wheat tortilla  That is 210 cal and 6 net carbs)  3 PM:  Mid day :  Another protein bar,  Or a  cheese stick (100 cal, 0 carbs),  Or 1 ounce of  almonds, walnuts, pistachios, pecans, peanuts,  Macadamia nuts. Or a Dannon light n Fit greek yogurt, 80 cal 8 net carbs . Avoid "granola"; the dried cranberries and raisins are loaded with carbohydrates. Mixed nuts ok if no raisins or cranberries or dried fruit.      6 PM  Dinner:  "mean and green:"  Meat/chicken/fish or a high protein legume; , with a green salad, and a low GI  Veggie (broccoli,  cauliflower, green beans, spinach, brussel sprouts. Lima beans) : Avoid "Low fat dressings, as well as Reyne Dumas and [address]! They are loaded with sugar! Instead use ranch, vinagrette,  Blue cheese, etc.  There is a low carb pasta by Dreamfield's available at [facility] that is acceptable and tastes great. Try Michel Angel's chicken piccata over low carb pasta. The chicken dish is 0 carbs, and can be found in frozen section at [employer]Js and [employer]. Also try [employer]" (pulled pork, no sauce,  0 carbs) and his pot roast.   both are in the refrigerated section at [employer]Js   Dreamfield's makes a low carb pasta only 5 g/serving.  Available at all grocery stores,  And tastes like normal pasta  9 PM snack : Breyer's "low carb" fudgsicle or  ice cream bar (Carb Smart line), or  Weight Watcher's ice cream bar , or another "no sugar added" ice cream;a serving of fresh  berries/cherries with whipped cream (Avoid bananas, pineapple, grapes  and watermelon on a regular basis because they are high in sugar)   Remember that snack Substitutions should be less than 10 carbs per serving and meals < 20 carbs. Remember to subtract fiber grams and sugar alcohols to get the "net carbs."   Return in 1 month to follow up on your blood sugars

## 2013-02-24 NOTE — Assessment & Plan Note (Signed)
Loss of control noted,  Discussed low GI diet trial and repeat a1c in 3 months.

## 2013-02-24 NOTE — Progress Notes (Signed)
Patient ID: [name], female   DOB: May 12, 1941, 72 y.o.   MRN: [id number]   Patient Active Problem List   Diagnosis Date Noted  . Screening for cervical cancer 11/19/2011  . Routine general medical examination at a health care facility 05/02/2011  . DIABETES MELLITUS, TYPE II 11/09/2010  . BRADYCARDIA 09/12/2009  . HYPERCALCEMIA 08/20/2007  . HYPOTHYROIDISM 02/06/2007  . HYPERLIPIDEMIA 02/06/2007  . HYPERTENSION 02/06/2007  . GERD 02/06/2007  . OSTEOPOROSIS 02/06/2007    Subjective:  CC:   Chief Complaint  Patient presents with  . Follow-up    labs    HPI:   [name] a 72 y.o. female who presents after being lost to follow up for one year.  She has a history of diabetes and hypertension but has not been seen in over one year .  Does not check sugars bc the strips have become too expensive.  .  Denies episodes of hypoglycemia, neuropathy, and urinary frequency   Past Medical History  Diagnosis Date  . GERD (gastroesophageal reflux disease)   . Hyperlipidemia   . Hypertension   . Thyroid disease   . Osteoporosis   . Diabetes mellitus     Past Surgical History  Procedure Laterality Date  . Vaginal delivery    . Cervical polypectomy    . Breast cyst aspiration    . Humerus fracture surgery    . Wrist fracture surgery         The following portions of the patient's history were reviewed and updated as appropriate: Allergies, current medications, and problem list.    Review of Systems:   12 Pt  review of systems was negative except those addressed in the HPI,     History   Social History  . Marital Status: Widowed    Spouse Name: N/A    Number of Children: 2  . 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 Smoking status: Never Smoker   . Smokeless tobacco: Never Used  . Alcohol Use: No  . Drug Use: No  . Sexually Active: Not on file   Other Topics Concern  . Not on file    Social History Narrative   Widowed in spring of 2012   Lost brother also   Has living will   Son and daughter hold health care POA   Would accept resuscitation but no prolonged artificial ventilation   Not sure about feeding tube    Objective:  BP 152/78  Pulse 70  Temp(Src) 98.4 F (36.9 C) (Oral)  Resp 16  Wt 152 lb 4 oz (69.06 kg)  BMI 26.54 kg/m2  SpO2 98%  General appearance: alert, cooperative and appears stated age Ears: normal TM's and external ear canals both ears Throat: lips, mucosa, and tongue normal; teeth and gums normal Neck: no adenopathy, no carotid bruit, supple, symmetrical, trachea midline and thyroid not enlarged, symmetric, no tenderness/mass/nodules Back: symmetric, no curvature. ROM normal. No CVA tenderness. Lungs: clear to auscultation bilaterally Heart: regular rate and rhythm, S1, S2 normal, no murmur, click, rub or gallop Abdomen: soft, non-tender; bowel sounds normal; no masses,  no organomegaly Pulses: 2+ and symmetric Skin: Skin color, texture, turgor normal. No rashes or lesions Lymph nodes: Cervical, supraclavicular, and axillary nodes normal. Foot exam:  Nails are well trimmed,  No callouses,  Sensation intact to microfilament  Assessment and Plan:  HYPERTENSION Not well controlled on current regimen. Renal function  stable, no changes today until patient checks his blood sugars remotely. Marland Kitchen  DIABETES MELLITUS, TYPE II Loss of control noted,  Discussed low GI diet trial and repeat a1c in 3 months.   HYPERLIPIDEMIA LDL is 124 and trigs are 300.  Will repeat in 3 months  After low GI diet trial and start therapy if not under control.    Updated Medication List Outpatient Encounter Prescriptions as of 02/24/2013  Medication Sig Dispense Refill  . ALPRAZolam (XANAX) 0.25 MG tablet Take 1 tablet (0.25 mg total) by mouth every 6 (six) hours as needed for anxiety.  30 tablet  0  . calcium-vitamin D (OSCAL WITH D) 500-200 MG-UNIT per tablet  Take 1 tablet by mouth daily.        Marland Kitchen levothyroxine (SYNTHROID, LEVOTHROID) 75 MCG tablet Take 1 tablet (75 mcg total) by mouth daily.  90 tablet  3  . Multiple Vitamin (MULTIVITAMIN) tablet Take 1 tablet by mouth daily.        Marland Kitchen Specialty Vitamins Products (MAGNESIUM, AMINO ACID CHELATE,) 133 MG tablet Take 1 tablet by mouth daily.      Marland Kitchen glucose blood test strip Check sugars twice daily  100 each  0  . losartan (COZAAR) 50 MG tablet Take 1 tablet (50 mg total) by mouth daily.  90 tablet  3  . [DISCONTINUED] losartan (COZAAR) 50 MG tablet Take 1 tablet (50 mg total) by mouth daily.  90 tablet  3   No facility-administered encounter medications on file as of 02/24/2013.

## 2013-02-24 NOTE — Assessment & Plan Note (Addendum)
Not well controlled on current regimen. Renal function stable, no changes today until patient checks his blood sugars remotely. Marland Kitchen

## 2013-02-26 ENCOUNTER — Telehealth: Payer: Self-pay | Admitting: *Deleted

## 2013-02-26 NOTE — Telephone Encounter (Signed)
Sent the add on for the UA microscopic, [facility] called and said one was done with the UA and is resulted

## 2013-03-10 LAB — HM PAP SMEAR: HM Pap smear: NORMAL

## 2013-03-25 ENCOUNTER — Ambulatory Visit (INDEPENDENT_AMBULATORY_CARE_PROVIDER_SITE_OTHER): Payer: Medicare Other | Admitting: Internal Medicine

## 2013-03-25 ENCOUNTER — Encounter: Payer: Self-pay | Admitting: Internal Medicine

## 2013-03-25 VITALS — BP 140/70 | HR 51 | Temp 98.5°F | Resp 14 | Wt 148.5 lb

## 2013-03-25 DIAGNOSIS — E119 Type 2 diabetes mellitus without complications: Secondary | ICD-10-CM

## 2013-03-25 DIAGNOSIS — Z6825 Body mass index (BMI) 25.0-25.9, adult: Secondary | ICD-10-CM

## 2013-03-25 DIAGNOSIS — R3129 Other microscopic hematuria: Secondary | ICD-10-CM

## 2013-03-25 DIAGNOSIS — E785 Hyperlipidemia, unspecified: Secondary | ICD-10-CM

## 2013-03-25 DIAGNOSIS — R319 Hematuria, unspecified: Secondary | ICD-10-CM

## 2013-03-25 DIAGNOSIS — I10 Essential (primary) hypertension: Secondary | ICD-10-CM

## 2013-03-25 DIAGNOSIS — E663 Overweight: Secondary | ICD-10-CM

## 2013-03-25 LAB — URINALYSIS, ROUTINE W REFLEX MICROSCOPIC
Bilirubin Urine: NEGATIVE
Hgb urine dipstick: NEGATIVE
Ketones, ur: NEGATIVE
Leukocytes, UA: NEGATIVE
Nitrite: NEGATIVE
RBC / HPF: NONE SEEN
Specific Gravity, Urine: 1.02
Total Protein, Urine: NEGATIVE
Urine Glucose: NEGATIVE
Urobilinogen, UA: 0.2
pH: 6.5 (ref 5.0–8.0)

## 2013-03-25 NOTE — Progress Notes (Signed)
Patient ID: [name], female   DOB: Feb 10, 1941, 72 y.o.   MRN: [id number]   Patient Active Problem List   Diagnosis Date Noted  . Hematuria, microscopic 03/26/2013  . Screening for cervical cancer 11/19/2011  . Routine general medical examination at a health care facility 05/02/2011  . DIABETES MELLITUS, TYPE II 11/09/2010  . BRADYCARDIA 09/12/2009  . HYPOTHYROIDISM 02/06/2007  . HYPERLIPIDEMIA 02/06/2007  . HYPERTENSION 02/06/2007  . GERD 02/06/2007  . OSTEOPOROSIS 02/06/2007    Subjective:  CC:   Chief Complaint  Patient presents with  . Follow-up    4 week    HPI:   [name] a 72 y.o. female who presents 3 month follow up on Type 2  diabetes mellitus, hypertension,  microscopic hematuria noted in April, and  Overweight.    1) Overweight: she has lost 4 lbs since last visit following a low glycemic index diet. She has not started exercising yet. BMI  Is < 26  2) DM:   herblood sugars have steadily improved by following a low glycemic index diet.   her fastings are now continually under 140. She has not started a walking program yet due to some safety concerns in her neighborhood.Marland Kitchen He denies any numbness or tingling in her feet. She's had no low blood sugars. Her energy level is much better since she has been limiting her starches.  3) Microscopic hemautria noted in April .   She has a remote history of tobacco use.  No history of kidney stones. no prior evaluation  \     Past Medical History  Diagnosis Date  . GERD (gastroesophageal reflux disease)   . Hyperlipidemia   . Hypertension   . Thyroid disease   . Osteoporosis   . Diabetes mellitus     Past Surgical History  Procedure Laterality Date  . Vaginal delivery    . Cervical polypectomy    . Breast cyst aspiration    . Humerus fracture surgery    . Wrist fracture surgery         The following portions of the patient's history were reviewed and updated as appropriate: Allergies, current  medications, and problem list.    Review of Systems:   12 Pt  review of systems was negative except those addressed in the HPI,     History   Social History  . Marital Status: Widowed    Spouse Name: N/A    Number of Children: 2  . 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 Smoking status: Never Smoker   . Smokeless tobacco: Never Used  . Alcohol Use: No  . Drug Use: No  . Sexually Active: Not on file   Other Topics Concern  . Not on file   Social History Narrative   Widowed in spring of 2012   Lost brother also   Has living will   Son and daughter hold health care POA   Would accept resuscitation but no prolonged artificial ventilation   Not sure about feeding tube    Objective:  BP 140/70  Pulse 51  Temp(Src) 98.5 F (36.9 C) (Oral)  Resp 14  Wt 148 lb 8 oz (67.359 kg)  BMI 25.89 kg/m2  SpO2 98%  General appearance: alert, cooperative and appears stated age Ears: normal TM's and external ear canals both ears Throat: lips, mucosa, and tongue normal; teeth and gums normal Neck: no adenopathy, no carotid  bruit, supple, symmetrical, trachea midline and thyroid not enlarged, symmetric, no tenderness/mass/nodules Back: symmetric, no curvature. ROM normal. No CVA tenderness. Lungs: clear to auscultation bilaterally Heart: regular rate and rhythm, S1, S2 normal, no murmur, click, rub or gallop Abdomen: soft, non-tender; bowel sounds normal; no masses,  no organomegaly Pulses: 2+ and symmetric Skin: Skin color, texture, turgor normal. No rashes or lesions Lymph nodes: Cervical, supraclavicular, and axillary nodes normal.  Assessment and Plan:  DIABETES MELLITUS, TYPE II With recent loss of control and A1c elevated to 7.82 months ago. She will return in one month. She has altered her diet is losing weight and is planned starting exercise regimen. No changes today. Foot exam was normal. She is  up-to-date with eye exams.  HYPERTENSION Improving control current regimen. Renal function stable, no changes today.  HYPERLIPIDEMIA Triglycerides of 300 and LDL of 120 noted at last visit. We are repeating in one month after she has been following a low glycemic index diet. Given her multiple risk factors for heart disease we will start statin therapy if still elevated.  Hematuria, microscopic Prior UA was notable for 11-20 red blood cells. Repeat today was normal. We will can continue to check every 6 months.  Overweight (BMI 25.0-29.9) BMI is now under 26 without exercise, achieved with low glycemic index diet. She is having regular exercise this month.   Updated Medication List Outpatient Encounter Prescriptions as of 03/25/2013  Medication Sig Dispense Refill  . calcium-vitamin D (OSCAL WITH D) 500-200 MG-UNIT per tablet Take 1 tablet by mouth daily.        Marland Kitchen glucose blood test strip Check sugars twice daily  100 each  0  . levothyroxine (SYNTHROID, LEVOTHROID) 75 MCG tablet Take 1 tablet (75 mcg total) by mouth daily.  90 tablet  3  . losartan (COZAAR) 50 MG tablet Take 1 tablet (50 mg total) by mouth daily.  90 tablet  3  . Specialty Vitamins Products (MAGNESIUM, AMINO ACID CHELATE,) 133 MG tablet Take 1 tablet by mouth daily.      Marland Kitchen ALPRAZolam (XANAX) 0.25 MG tablet Take 1 tablet (0.25 mg total) by mouth every 6 (six) hours as needed for anxiety.  30 tablet  0  . Multiple Vitamin (MULTIVITAMIN) tablet Take 1 tablet by mouth daily.         No facility-administered encounter medications on file as of 03/25/2013.

## 2013-03-25 NOTE — Patient Instructions (Addendum)
You are doing fantastic!!!!!  Keep up the good work   We are rechecking your urine  Today  If there is still blood in your urine,  We will refer you for an evaluation  by Dr Achilles Dunk

## 2013-03-26 ENCOUNTER — Telehealth: Payer: Self-pay | Admitting: Internal Medicine

## 2013-03-26 ENCOUNTER — Encounter: Payer: Self-pay | Admitting: Internal Medicine

## 2013-03-26 DIAGNOSIS — E663 Overweight: Secondary | ICD-10-CM | POA: Insufficient documentation

## 2013-03-26 DIAGNOSIS — R3129 Other microscopic hematuria: Secondary | ICD-10-CM | POA: Insufficient documentation

## 2013-03-26 NOTE — Assessment & Plan Note (Signed)
BMI is now under 26 without exercise, achieved with low glycemic index diet. She is having regular exercise this month.

## 2013-03-26 NOTE — Assessment & Plan Note (Signed)
With recent loss of control and A1c elevated to 7.82 months ago. She will return in one month. She has altered her diet is losing weight and is planned starting exercise regimen. No changes today. Foot exam was normal. She is up-to-date with eye exams.

## 2013-03-26 NOTE — Assessment & Plan Note (Signed)
Triglycerides of 300 and LDL of 120 noted at last visit. We are repeating in one month after she has been following a low glycemic index diet. Given her multiple risk factors for heart disease we will start statin therapy if still elevated.

## 2013-03-26 NOTE — Assessment & Plan Note (Signed)
Improving control current regimen. Renal function stable, no changes today.

## 2013-03-26 NOTE — Assessment & Plan Note (Signed)
Prior UA was notable for 11-20 red blood cells. Repeat today was normal. We will can continue to check every 6 months.

## 2013-04-09 ENCOUNTER — Telehealth: Payer: Self-pay | Admitting: Internal Medicine

## 2013-04-09 NOTE — Telephone Encounter (Signed)
Her mammogram was incomplete.  The facility will be contacting her to schedule additional images of both breasts

## 2013-04-09 NOTE — Telephone Encounter (Signed)
FYI patient is schedule for more films today at 2.30

## 2013-04-20 ENCOUNTER — Encounter: Payer: Self-pay | Admitting: Internal Medicine

## 2013-04-21 ENCOUNTER — Encounter: Payer: Self-pay | Admitting: Internal Medicine

## 2013-04-24 ENCOUNTER — Other Ambulatory Visit: Payer: Self-pay | Admitting: *Deleted

## 2013-04-24 MED ORDER — GLUCOSE BLOOD VI STRP: ORAL_STRIP | Status: DC

## 2013-05-11 ENCOUNTER — Ambulatory Visit (INDEPENDENT_AMBULATORY_CARE_PROVIDER_SITE_OTHER): Payer: Medicare Other | Admitting: Internal Medicine

## 2013-05-11 ENCOUNTER — Encounter: Payer: Self-pay | Admitting: Internal Medicine

## 2013-05-11 VITALS — BP 138/64 | HR 74 | Temp 97.4°F | Resp 14 | Ht 63.25 in | Wt 146.0 lb

## 2013-05-11 DIAGNOSIS — E039 Hypothyroidism, unspecified: Secondary | ICD-10-CM

## 2013-05-11 DIAGNOSIS — M810 Age-related osteoporosis without current pathological fracture: Secondary | ICD-10-CM

## 2013-05-11 DIAGNOSIS — L309 Dermatitis, unspecified: Secondary | ICD-10-CM | POA: Insufficient documentation

## 2013-05-11 DIAGNOSIS — Z0000 Encounter for general adult medical examination without abnormal findings: Secondary | ICD-10-CM

## 2013-05-11 DIAGNOSIS — E663 Overweight: Secondary | ICD-10-CM

## 2013-05-11 DIAGNOSIS — E119 Type 2 diabetes mellitus without complications: Secondary | ICD-10-CM

## 2013-05-11 DIAGNOSIS — L259 Unspecified contact dermatitis, unspecified cause: Secondary | ICD-10-CM

## 2013-05-11 DIAGNOSIS — Z1211 Encounter for screening for malignant neoplasm of colon: Secondary | ICD-10-CM

## 2013-05-11 DIAGNOSIS — Z6825 Body mass index (BMI) 25.0-25.9, adult: Secondary | ICD-10-CM

## 2013-05-11 LAB — HEMOGLOBIN A1C: Hgb A1c MFr Bld: 6.4 % (ref 4.6–6.5)

## 2013-05-11 LAB — COMPREHENSIVE METABOLIC PANEL WITH GFR
ALT: 15 U/L (ref 0–35)
AST: 15 U/L (ref 0–37)
Albumin: 4.3 g/dL (ref 3.5–5.2)
Alkaline Phosphatase: 80 U/L (ref 39–117)
BUN: 27 mg/dL — ABNORMAL HIGH (ref 6–23)
CO2: 28 meq/L (ref 19–32)
Calcium: 10 mg/dL (ref 8.4–10.5)
Chloride: 105 meq/L (ref 96–112)
Creatinine, Ser: 0.7 mg/dL (ref 0.4–1.2)
GFR: 87.52 mL/min
Glucose, Bld: 114 mg/dL — ABNORMAL HIGH (ref 70–99)
Potassium: 4.2 meq/L (ref 3.5–5.1)
Sodium: 140 meq/L (ref 135–145)
Total Bilirubin: 0.7 mg/dL (ref 0.3–1.2)
Total Protein: 6.8 g/dL (ref 6.0–8.3)

## 2013-05-11 LAB — MICROALBUMIN / CREATININE URINE RATIO
Creatinine,U: 121 mg/dL
Microalb Creat Ratio: 0.2 mg/g (ref 0.0–30.0)
Microalb, Ur: 0.3 mg/dL (ref 0.0–1.9)

## 2013-05-11 LAB — HM DIABETES FOOT EXAM: HM Diabetic Foot Exam: NORMAL

## 2013-05-11 NOTE — Assessment & Plan Note (Addendum)
Annual comprehensive exam was done including breast, excluding Pelvic.   All screenings have been addressed . Pap smear was normal last year.

## 2013-05-11 NOTE — Assessment & Plan Note (Signed)
She has chronic dry hands with cracking of the skin and occasional itching. She has been diagnosed with either eczema or psoriasis. She does have a history of asthma in other places. She will followup with dermatology for patch testing in the very near future.

## 2013-05-11 NOTE — Assessment & Plan Note (Addendum)
With  Continued improvement in A1c since she has converted to a low glycemic index diet. A1c is due currently. She is on aspirin and an ARB. Foot exam was normal. She is up-to-date with eye exams.

## 2013-05-11 NOTE — Progress Notes (Signed)
Patient ID: [name], female   DOB: 1941-03-14, 72 y.o.   MRN: [id number]  The patient is here for annual Medicare wellness examination and management of other chronic and acute problems. She has been following a low glycemic index diet to manage her diabetes and her overweight and has lost another 2 pounds since last visit. Her blood sugars have improved remarkably and most of her fastings have been under 120. She is not exercising regularly but works in the yard cutting grass with a push lawnmower once or twice weekly for 40 minutes.   The risk factors are reflected in the social history.  The roster of all physicians providing medical care to patient - is listed in the Snapshot section of the chart.  Activities of daily living:  The patient is 100% independent in all ADLs: dressing, toileting, feeding as well as independent mobility  Home safety : The patient has smoke detectors in the home. They wear seatbelts.  There are no firearms at home. There is no violence in the home.   There is no risks for hepatitis, STDs or HIV. There is no   history of blood transfusion. They have no travel history to infectious disease endemic areas of the world.  The patient has seen their dentist in the last six month. They have seen their eye doctor in the last year. They admit to slight hearing difficulty with regard to whispered voices and some television programs.  They have deferred audiologic testing in the last year.  They do not  have excessive sun exposure. Discussed the need for sun protection: hats, long sleeves and use of sunscreen if there is significant sun exposure.   Diet: the importance of a healthy diet is discussed. They do have a healthy diet.  The benefits of regular aerobic exercise were discussed. She walks 4 times per week ,  20 minutes.   Depression screen: there are no signs or vegative symptoms of depression- irritability, change in appetite, anhedonia,  sadness/tearfullness.  Cognitive assessment: the patient manages all their financial and personal affairs and is actively engaged. They could relate day,date,year and events; recalled 2/3 objects at 3 minutes; performed clock-face test normally.  The following portions of the patient's history were reviewed and updated as appropriate: allergies, current medications, past family history, past medical history,  past surgical history, past social history  and problem list.  Visual acuity was not assessed per patient preference since she has regular follow up with her ophthalmologist. Hearing and body mass index were assessed and reviewed.   During the course of the visit the patient was educated and counseled about appropriate screening and preventive services including : fall prevention , diabetes screening, nutrition counseling, colorectal cancer screening, and recommended immunizations.    Objective:  BP 138/64  Pulse 74  Temp(Src) 97.4 F (36.3 C) (Oral)  Resp 14  Ht 5' 3.25" (1.607 m)  Wt 146 lb (66.225 kg)  BMI 25.64 kg/m2  SpO2 98%  General appearance: alert, cooperative and appears stated age Head: Normocephalic, without obvious abnormality, atraumatic Eyes: conjunctivae/corneas clear. PERRL, EOM's intact. Fundi benign. Ears: normal TM's and external ear canals both ears Nose: Nares normal. Septum midline. Mucosa normal. No drainage or sinus tenderness. Throat: lips, mucosa, and tongue normal; teeth and gums normal Neck: no adenopathy, no carotid bruit, no JVD, supple, symmetrical, trachea midline and thyroid not enlarged, symmetric, no tenderness/mass/nodules Lungs: clear to auscultation bilaterally Breasts: normal appearance, no masses or tenderness Heart: regular rate and rhythm,  S1, S2 normal, no murmur, click, rub or gallop Abdomen: soft, non-tender; bowel sounds normal; no masses,  no organomegaly Extremities: extremities normal, atraumatic, no cyanosis or edema Pulses:  2+ and symmetric Skin: Skin color, texture, turgor normal. No rashes or lesions Neurologic: Alert and oriented X 3, normal strength and tone. Normal symmetric reflexes. Normal coordination and gait.  Foot exam:  Nails are well trimmed,  No callouses,  Sensation intact to microfilament  Assessment and Plan:  Routine general medical examination at a health care facility Annual comprehensive exam was done including breast, excluding Pelvic.   All screenings have been addressed . Pap smear was normal last year.  DIABETES MELLITUS, TYPE II With  Continued improvement in A1c since she has converted to a low glycemic index diet. A1c is due currently. She is on aspirin and an ARB. Foot exam was normal. She is up-to-date with eye exams.    Eczema of hand She has chronic dry hands with cracking of the skin and occasional itching. She has been diagnosed with either eczema or psoriasis. She does have a history of asthma in other places. She will followup with dermatology for patch testing in the very near future.  Overweight (BMI 25.0-29.9) She has improved her weight and diabetes control with a low glycemic index diet. She is exercising 2-3 times a week. Her personal goal is 140 pounds. Encouragement given to start daily exercise.   Updated Medication List Outpatient Encounter Prescriptions as of 05/11/2013  Medication Sig Dispense Refill  . ALPRAZolam (XANAX) 0.25 MG tablet Take 1 tablet (0.25 mg total) by mouth every 6 (six) hours as needed for anxiety.  30 tablet  0  . aspirin 81 MG tablet Take 81 mg by mouth daily.      . calcium-vitamin D (OSCAL WITH D) 500-200 MG-UNIT per tablet Take 1 tablet by mouth daily.        Marland Kitchen glucose blood test strip Check sugars twice daily  100 each  0  . levothyroxine (SYNTHROID, LEVOTHROID) 75 MCG tablet Take 1 tablet (75 mcg total) by mouth daily.  90 tablet  3  . losartan (COZAAR) 50 MG tablet Take 1 tablet (50 mg total) by mouth daily.  90 tablet  3  .  Specialty Vitamins Products (MAGNESIUM, AMINO ACID CHELATE,) 133 MG tablet Take 1 tablet by mouth daily.      . Multiple Vitamin (MULTIVITAMIN) tablet Take 1 tablet by mouth daily.         No facility-administered encounter medications on file as of 05/11/2013.

## 2013-05-11 NOTE — Patient Instructions (Addendum)
You had your annual Medicare wellness exam today  Please use the stool kit to send [place] back a sample to test for blood.  This is your colon CA screening test.   You are encouraged to have the influenza and the Shingles vaccine.  I will send a prescription to your local pharmacy for the shingles shot  if you decide you want it     You are doing great!!   We will contact you with the bloodwork results

## 2013-05-11 NOTE — Assessment & Plan Note (Signed)
She has improved her weight and diabetes control with a low glycemic index diet. She is exercising 2-3 times a week. Her personal goal is 140 pounds. Encouragement given to start daily exercise.

## 2013-05-12 ENCOUNTER — Encounter: Payer: Self-pay | Admitting: *Deleted

## 2013-05-28 ENCOUNTER — Other Ambulatory Visit (INDEPENDENT_AMBULATORY_CARE_PROVIDER_SITE_OTHER): Payer: Medicare Other

## 2013-05-28 DIAGNOSIS — Z1211 Encounter for screening for malignant neoplasm of colon: Secondary | ICD-10-CM

## 2013-05-29 ENCOUNTER — Other Ambulatory Visit: Payer: Self-pay | Admitting: *Deleted

## 2013-05-29 LAB — FECAL OCCULT BLOOD, IMMUNOCHEMICAL: Fecal Occult Bld: NEGATIVE

## 2013-05-29 MED ORDER — LEVOTHYROXINE SODIUM 75 MCG PO TABS: 75.0000 ug | ORAL_TABLET | Freq: Every day | ORAL | Status: DC

## 2013-06-02 ENCOUNTER — Encounter: Payer: Self-pay | Admitting: *Deleted

## 2013-06-09 ENCOUNTER — Telehealth: Payer: Self-pay | Admitting: *Deleted

## 2013-06-09 MED ORDER — LEVOTHYROXINE SODIUM 75 MCG PO TABS: 75.0000 ug | ORAL_TABLET | Freq: Every day | ORAL | Status: DC

## 2013-06-09 NOTE — Telephone Encounter (Signed)
Patient medication was [facility] of [facility] script as requested.

## 2013-07-21 ENCOUNTER — Other Ambulatory Visit: Payer: Self-pay | Admitting: *Deleted

## 2013-07-21 MED ORDER — GLUCOSE BLOOD VI STRP: ORAL_STRIP | Status: DC

## 2013-07-22 ENCOUNTER — Telehealth: Payer: Self-pay | Admitting: *Deleted

## 2013-07-22 NOTE — Telephone Encounter (Signed)
Was sent in yesterday electronically

## 2013-07-22 NOTE — Telephone Encounter (Signed)
CONTOUR M/MM TEST STRIPS   CHECK BS TWICE DAILY

## 2013-08-14 ENCOUNTER — Telehealth: Payer: Self-pay | Admitting: *Deleted

## 2013-08-14 DIAGNOSIS — E119 Type 2 diabetes mellitus without complications: Secondary | ICD-10-CM

## 2013-08-14 DIAGNOSIS — E039 Hypothyroidism, unspecified: Secondary | ICD-10-CM

## 2013-08-14 NOTE — Telephone Encounter (Signed)
Pt is coming in for labs 12.15.2014 what labs and dx?  

## 2013-08-17 ENCOUNTER — Other Ambulatory Visit (INDEPENDENT_AMBULATORY_CARE_PROVIDER_SITE_OTHER): Payer: Medicare Other

## 2013-08-17 DIAGNOSIS — E039 Hypothyroidism, unspecified: Secondary | ICD-10-CM

## 2013-08-17 DIAGNOSIS — E119 Type 2 diabetes mellitus without complications: Secondary | ICD-10-CM

## 2013-08-17 LAB — COMPREHENSIVE METABOLIC PANEL WITH GFR
ALT: 14 U/L (ref 0–35)
AST: 13 U/L (ref 0–37)
Albumin: 4.4 g/dL (ref 3.5–5.2)
Alkaline Phosphatase: 85 U/L (ref 39–117)
BUN: 23 mg/dL (ref 6–23)
CO2: 29 meq/L (ref 19–32)
Calcium: 9.6 mg/dL (ref 8.4–10.5)
Chloride: 104 meq/L (ref 96–112)
Creatinine, Ser: 0.7 mg/dL (ref 0.4–1.2)
GFR: 83.32 mL/min
Glucose, Bld: 138 mg/dL — ABNORMAL HIGH (ref 70–99)
Potassium: 4.8 meq/L (ref 3.5–5.1)
Sodium: 138 meq/L (ref 135–145)
Total Bilirubin: 0.9 mg/dL (ref 0.3–1.2)
Total Protein: 7.1 g/dL (ref 6.0–8.3)

## 2013-08-17 LAB — LDL CHOLESTEROL, DIRECT: Direct LDL: 150.2 mg/dL

## 2013-08-17 LAB — LIPID PANEL
Cholesterol: 225 mg/dL — ABNORMAL HIGH (ref 0–200)
HDL: 46.9 mg/dL
Total CHOL/HDL Ratio: 5
Triglycerides: 222 mg/dL — ABNORMAL HIGH (ref 0.0–149.0)
VLDL: 44.4 mg/dL — ABNORMAL HIGH (ref 0.0–40.0)

## 2013-08-17 LAB — HEMOGLOBIN A1C: Hgb A1c MFr Bld: 6.7 % — ABNORMAL HIGH (ref 4.6–6.5)

## 2013-08-17 LAB — MICROALBUMIN / CREATININE URINE RATIO
Microalb Creat Ratio: 0.4 mg/g (ref 0.0–30.0)
Microalb, Ur: 0.4 mg/dL (ref 0.0–1.9)

## 2013-08-17 LAB — TSH: TSH: 4.4 u[IU]/mL (ref 0.35–5.50)

## 2013-08-17 NOTE — Telephone Encounter (Signed)
Already taken care of

## 2013-08-17 NOTE — Telephone Encounter (Signed)
Already ordered

## 2013-08-20 ENCOUNTER — Ambulatory Visit (INDEPENDENT_AMBULATORY_CARE_PROVIDER_SITE_OTHER): Payer: Medicare Other | Admitting: Internal Medicine

## 2013-08-20 ENCOUNTER — Encounter (INDEPENDENT_AMBULATORY_CARE_PROVIDER_SITE_OTHER): Payer: Self-pay

## 2013-08-20 VITALS — BP 128/80 | HR 65 | Temp 98.1°F | Wt 146.0 lb

## 2013-08-20 DIAGNOSIS — R3129 Other microscopic hematuria: Secondary | ICD-10-CM

## 2013-08-20 DIAGNOSIS — I10 Essential (primary) hypertension: Secondary | ICD-10-CM

## 2013-08-20 DIAGNOSIS — E119 Type 2 diabetes mellitus without complications: Secondary | ICD-10-CM

## 2013-08-20 DIAGNOSIS — E785 Hyperlipidemia, unspecified: Secondary | ICD-10-CM

## 2013-08-20 DIAGNOSIS — E039 Hypothyroidism, unspecified: Secondary | ICD-10-CM

## 2013-08-20 MED ORDER — ZOSTER VACCINE LIVE 19400 UNT/0.65ML ~~LOC~~ SOLR: 0.6500 mL | Freq: Once | SUBCUTANEOUS | Status: DC

## 2013-08-20 MED ORDER — ROSUVASTATIN CALCIUM 10 MG PO TABS: ORAL_TABLET | ORAL | Status: DC

## 2013-08-20 MED ORDER — TETANUS-DIPHTH-ACELL PERTUSSIS 5-2.5-18.5 LF-MCG/0.5 IM SUSP: 0.5000 mL | Freq: Once | INTRAMUSCULAR | Status: DC

## 2013-08-20 NOTE — Assessment & Plan Note (Addendum)
Using the Framingham risk calculator,  her10 year risk of coronary artery disease is 29%..New ACC guidelines recommend starting patients aged 70 or higher on moderate intensity statin therapy for diabetes and concurrent LDL between 70-189. Samples of crestor given.    Lab Results  Component Value Date   CHOL 225* 08/17/2013   HDL 46.90 08/17/2013   LDLDIRECT 150.2 08/17/2013   TRIG 222.0* 08/17/2013   CHOLHDL 5 08/17/2013

## 2013-08-20 NOTE — Patient Instructions (Signed)
Your diabetes is under good control on current regimen, you do not need medications for diabetes   If you indulge in sweets over the holidays,  Going for a 15 minute walk afterward will help control your blood sugars   New ACC guidelines recommend starting patients aged 71 or higher on moderate intensity statin therapy for diabetes and concurrent LDL between 70-189.  Therefore we are going to try taking crestor every other day  Please return in mid February for fasting blood work.  Return to see me in April

## 2013-08-22 ENCOUNTER — Encounter: Payer: Self-pay | Admitting: Internal Medicine

## 2013-08-22 NOTE — Assessment & Plan Note (Signed)
Well controlled on current regimen. Renal function stable, no changes today. Lab Results  Component Value Date   CREATININE 0.7 08/17/2013

## 2013-08-22 NOTE — Assessment & Plan Note (Signed)
Resolved by repeat check 5 months ago. Will recheck

## 2013-08-22 NOTE — Progress Notes (Signed)
Patient ID: [name], female   DOB: 04/28/1941, 72 y.o.   MRN: [id number]  Patient Active Problem List   Diagnosis Date Noted  . Eczema of hand 05/11/2013  . Hematuria, microscopic 03/26/2013  . Overweight (BMI 25.0-29.9) 03/26/2013  . Screening for cervical cancer 11/19/2011  . Routine general medical examination at a health care facility 05/02/2011  . DIABETES MELLITUS, TYPE II 11/09/2010  . HYPOTHYROIDISM 02/06/2007  . HYPERLIPIDEMIA 02/06/2007  . HYPERTENSION 02/06/2007  . GERD 02/06/2007  . OSTEOPOROSIS 02/06/2007    Subjective:  CC:   Chief Complaint  Patient presents with  . Follow-up    HPI:   [name] a 72 y.o. female who presents  For 3 month follow up on Type 2  diabetes mellitus, hypertension,  and  Overweight.    1) Overweigh: she has not lost any weignht since September despite following a low glycemic index diet. She has not started exercising yet.  2) DM:   Her blood sugars have steadily improved by following a low glycemic index diet.   her fastings are now continually under 140. She has not started a walking program yet due to some safety concerns in her neighborhood.Marland Kitchen sHe denies any numbness or tingling in her feet. She's had no low blood sugars. Her energy level is much better since she has been limiting her starches.  3) Microscopic hemautria noted in April .   She has a remote history of tobacco use.  No history of kidney stones. She has deferred urologic evaluation .\    Past Medical History  Diagnosis Date  . GERD (gastroesophageal reflux disease)   . Hyperlipidemia   . Hypertension   . Thyroid disease   . Osteoporosis   . Diabetes mellitus     Past Surgical History  Procedure Laterality Date  . Vaginal delivery    . Cervical polypectomy    . Breast cyst aspiration    . Humerus fracture surgery    . Wrist fracture surgery         The following portions of the patient's history were reviewed and updated as appropriate: Allergies,  current medications, and problem list.    Review of Systems:   12 Pt  review of systems was negative except those addressed in the HPI,     History   Social History  . Marital Status: Widowed    Spouse Name: N/A    Number of Children: 2  . 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 Smoking status: Never Smoker   . Smokeless tobacco: Never Used  . Alcohol Use: No  . Drug Use: No  . Sexual Activity: Not on file   Other Topics Concern  . Not on file   Social History Narrative   Widowed in spring of 2012   Lost brother also   Has living will   Son and daughter hold health care POA   Would accept resuscitation but no prolonged artificial ventilation   Not sure about feeding tube    Objective:  Filed Vitals:   08/20/13 0924  BP: 128/80  Pulse: 65  Temp: 98.1 F (36.7 C)     General appearance: alert, cooperative and appears stated age Ears: normal TM's and external ear canals both ears Throat: lips, mucosa, and tongue normal; teeth and gums normal Neck: no adenopathy, no carotid bruit, supple, symmetrical, trachea midline and thyroid not enlarged, symmetric, no tenderness/mass/nodules Back:  symmetric, no curvature. ROM normal. No CVA tenderness. Lungs: clear to auscultation bilaterally Heart: regular rate and rhythm, S1, S2 normal, no murmur, click, rub or gallop Abdomen: soft, non-tender; bowel sounds normal; no masses,  no organomegaly Pulses: 2+ and symmetric Skin: Skin color, texture, turgor normal. No rashes or lesions Lymph nodes: Cervical, supraclavicular, and axillary nodes normal.  Assessment and Plan:  HYPERLIPIDEMIA Using the Framingham risk calculator,  her10 year risk of coronary artery disease is 29%..New ACC guidelines recommend starting patients aged 12 or higher on moderate intensity statin therapy for diabetes and concurrent LDL between 70-189. Samples of crestor given.     Lab Results  Component Value Date   CHOL 225* 08/17/2013   HDL 46.90 08/17/2013   LDLDIRECT 150.2 08/17/2013   TRIG 222.0* 08/17/2013   CHOLHDL 5 08/17/2013      DIABETES MELLITUS, TYPE II Well-controlled on current medications.  hemoglobin A1c has been consistently less than 7.0 . she is up-to-date on eye exams and her foot exam is normal.  Will repeat his urine microalbumin to creatinine ratio at next visit. she is on the appropriate medications.  Lab Results  Component Value Date   HGBA1C 6.7* 08/17/2013   Lab Results  Component Value Date   MICROALBUR 0.4 08/17/2013    HYPERTENSION Well controlled on current regimen. Renal function stable, no changes today. Lab Results  Component Value Date   CREATININE 0.7 08/17/2013     HYPOTHYROIDISM Thyroid function is WNL on current dose.  No current changes needed.  Lab Results  Component Value Date   TSH 4.40 08/17/2013    Updated Medication List Outpatient Encounter Prescriptions as of 08/20/2013  Medication Sig  . aspirin 81 MG tablet Take 81 mg by mouth daily.  . calcium-vitamin D (OSCAL WITH D) 500-200 MG-UNIT per tablet Take 1 tablet by mouth daily.    Marland Kitchen glucose blood test strip Check sugars twice daily  . levothyroxine (SYNTHROID, LEVOTHROID) 75 MCG tablet Take 1 tablet (75 mcg total) by mouth daily.  Marland Kitchen losartan (COZAAR) 50 MG tablet Take 1 tablet (50 mg total) by mouth daily.  . Multiple Vitamin (MULTIVITAMIN) tablet Take 1 tablet by mouth daily.    Marland Kitchen Specialty Vitamins Products (MAGNESIUM, AMINO ACID CHELATE,) 133 MG tablet Take 1 tablet by mouth daily.  Marland Kitchen ALPRAZolam (XANAX) 0.25 MG tablet Take 1 tablet (0.25 mg total) by mouth every 6 (six) hours as needed for anxiety.  . rosuvastatin (CRESTOR) 10 MG tablet Every other day  . Tdap (BOOSTRIX) 5-2.5-18.5 LF-MCG/0.5 injection Inject 0.5 mLs into the muscle once.  . zoster vaccine live, PF, (ZOSTAVAX) 16109 UNT/0.65ML injection Inject 19,400 Units into the skin  once.     No orders of the defined types were placed in this encounter.    No Follow-up on file.

## 2013-08-22 NOTE — Assessment & Plan Note (Signed)
Well-controlled on current medications.  hemoglobin A1c has been consistently less than 7.0 . she is up-to-date on eye exams and her foot exam is normal.  Will repeat his urine microalbumin to creatinine ratio at next visit. she is on the appropriate medications.  Lab Results  Component Value Date   HGBA1C 6.7* 08/17/2013   Lab Results  Component Value Date   MICROALBUR 0.4 08/17/2013

## 2013-08-22 NOTE — Assessment & Plan Note (Signed)
Thyroid function is WNL on current dose.  No current changes needed.  Lab Results  Component Value Date   TSH 4.40 08/17/2013

## 2013-09-30 ENCOUNTER — Telehealth: Payer: Self-pay | Admitting: Internal Medicine

## 2013-09-30 ENCOUNTER — Other Ambulatory Visit: Payer: Self-pay | Admitting: Internal Medicine

## 2013-09-30 MED ORDER — GLUCOSE BLOOD VI STRP: ORAL_STRIP | Status: DC

## 2013-09-30 NOTE — Telephone Encounter (Signed)
[facility] on [address] Pt is out of test strips 100 of ascensia contour micro fill Glucose blood test strips

## 2013-09-30 NOTE — Telephone Encounter (Signed)
Rx sent to pharmacy by escript  

## 2013-10-05 ENCOUNTER — Other Ambulatory Visit: Payer: Self-pay | Admitting: *Deleted

## 2013-10-05 MED ORDER — GLUCOSE BLOOD VI STRP: ORAL_STRIP | Status: DC

## 2013-10-05 NOTE — Telephone Encounter (Signed)
Received phone call pt's daughter regarding test strips Rx needs to be sent to [place], called last week and still is not done. Told her sorry will fax Rx for Test strips over to pharmacy now. Verbalized understanding. Rx sent to [place].

## 2013-10-19 ENCOUNTER — Other Ambulatory Visit (INDEPENDENT_AMBULATORY_CARE_PROVIDER_SITE_OTHER): Payer: Commercial Managed Care - HMO

## 2013-10-19 ENCOUNTER — Telehealth: Payer: Self-pay | Admitting: *Deleted

## 2013-10-19 ENCOUNTER — Encounter (INDEPENDENT_AMBULATORY_CARE_PROVIDER_SITE_OTHER): Payer: Self-pay

## 2013-10-19 DIAGNOSIS — E559 Vitamin D deficiency, unspecified: Secondary | ICD-10-CM

## 2013-10-19 DIAGNOSIS — R5381 Other malaise: Secondary | ICD-10-CM

## 2013-10-19 DIAGNOSIS — E039 Hypothyroidism, unspecified: Secondary | ICD-10-CM

## 2013-10-19 DIAGNOSIS — E785 Hyperlipidemia, unspecified: Secondary | ICD-10-CM

## 2013-10-19 DIAGNOSIS — E119 Type 2 diabetes mellitus without complications: Secondary | ICD-10-CM

## 2013-10-19 DIAGNOSIS — Z79899 Other long term (current) drug therapy: Secondary | ICD-10-CM

## 2013-10-19 DIAGNOSIS — R5383 Other fatigue: Secondary | ICD-10-CM

## 2013-10-19 DIAGNOSIS — R3129 Other microscopic hematuria: Secondary | ICD-10-CM

## 2013-10-19 LAB — URINALYSIS, ROUTINE W REFLEX MICROSCOPIC
Bilirubin Urine: NEGATIVE
Ketones, ur: NEGATIVE
Nitrite: NEGATIVE
Specific Gravity, Urine: 1.02
Total Protein, Urine: NEGATIVE
Urine Glucose: NEGATIVE
Urobilinogen, UA: 0.2
pH: 5.5 (ref 5.0–8.0)

## 2013-10-19 LAB — COMPREHENSIVE METABOLIC PANEL WITH GFR
ALT: 11 U/L (ref 0–35)
AST: 13 U/L (ref 0–37)
Albumin: 4.9 g/dL (ref 3.5–5.2)
Alkaline Phosphatase: 93 U/L (ref 39–117)
BUN: 22 mg/dL (ref 6–23)
CO2: 28 meq/L (ref 19–32)
Calcium: 9.8 mg/dL (ref 8.4–10.5)
Chloride: 101 meq/L (ref 96–112)
Creat: 0.67 mg/dL (ref 0.50–1.10)
Glucose, Bld: 126 mg/dL — ABNORMAL HIGH (ref 70–99)
Potassium: 4.6 meq/L (ref 3.5–5.3)
Sodium: 139 meq/L (ref 135–145)
Total Bilirubin: 0.6 mg/dL (ref 0.2–1.2)
Total Protein: 7 g/dL (ref 6.0–8.3)

## 2013-10-19 LAB — CBC WITH DIFFERENTIAL/PLATELET
Basophils Absolute: 0 10*3/uL (ref 0.0–0.1)
Basophils Relative: 0 % (ref 0–1)
Eosinophils Absolute: 0.2 10*3/uL (ref 0.0–0.7)
Eosinophils Relative: 3 % (ref 0–5)
HCT: 40.8 % (ref 36.0–46.0)
Hemoglobin: 14.1 g/dL (ref 12.0–15.0)
Lymphocytes Relative: 26 % (ref 12–46)
Lymphs Abs: 1.9 10*3/uL (ref 0.7–4.0)
MCH: 28.7 pg (ref 26.0–34.0)
MCHC: 34.6 g/dL (ref 30.0–36.0)
MCV: 82.9 fL (ref 78.0–100.0)
Monocytes Absolute: 0.5 10*3/uL (ref 0.1–1.0)
Monocytes Relative: 7 % (ref 3–12)
Neutro Abs: 4.9 10*3/uL (ref 1.7–7.7)
Neutrophils Relative %: 64 % (ref 43–77)
Platelets: 168 10*3/uL (ref 150–400)
RBC: 4.92 MIL/uL (ref 3.87–5.11)
RDW: 14.2 % (ref 11.5–15.5)
WBC: 7.5 10*3/uL (ref 4.0–10.5)

## 2013-10-19 LAB — LIPID PANEL
Cholesterol: 164 mg/dL (ref 0–200)
HDL: 49 mg/dL
LDL Cholesterol: 70 mg/dL (ref 0–99)
Total CHOL/HDL Ratio: 3.3 ratio
Triglycerides: 224 mg/dL — ABNORMAL HIGH
VLDL: 45 mg/dL — ABNORMAL HIGH (ref 0–40)

## 2013-10-19 NOTE — Telephone Encounter (Signed)
What labs and dx?  

## 2013-10-20 LAB — VITAMIN D 25 HYDROXY (VIT D DEFICIENCY, FRACTURES): Vit D, 25-Hydroxy: 54 ng/mL (ref 30–89)

## 2013-10-20 LAB — TSH: TSH: 5.228 u[IU]/mL — ABNORMAL HIGH (ref 0.350–4.500)

## 2013-10-20 NOTE — Addendum Note (Signed)
Addended by: Crecencio Mc on: 10/20/2013 02:11 PM   Modules accepted: Orders

## 2013-10-21 ENCOUNTER — Encounter: Payer: Self-pay | Admitting: *Deleted

## 2013-11-30 ENCOUNTER — Other Ambulatory Visit (INDEPENDENT_AMBULATORY_CARE_PROVIDER_SITE_OTHER): Payer: Commercial Managed Care - HMO

## 2013-11-30 DIAGNOSIS — E119 Type 2 diabetes mellitus without complications: Secondary | ICD-10-CM

## 2013-11-30 LAB — COMPREHENSIVE METABOLIC PANEL WITH GFR
ALT: 16 U/L (ref 0–35)
AST: 16 U/L (ref 0–37)
Albumin: 4.6 g/dL (ref 3.5–5.2)
Alkaline Phosphatase: 92 U/L (ref 39–117)
BUN: 20 mg/dL (ref 6–23)
CO2: 28 meq/L (ref 19–32)
Calcium: 9.8 mg/dL (ref 8.4–10.5)
Chloride: 100 meq/L (ref 96–112)
Creatinine, Ser: 0.8 mg/dL (ref 0.4–1.2)
GFR: 77.12 mL/min
Glucose, Bld: 137 mg/dL — ABNORMAL HIGH (ref 70–99)
Potassium: 4.8 meq/L (ref 3.5–5.1)
Sodium: 136 meq/L (ref 135–145)
Total Bilirubin: 0.6 mg/dL (ref 0.3–1.2)
Total Protein: 7.3 g/dL (ref 6.0–8.3)

## 2013-11-30 LAB — HEMOGLOBIN A1C: Hgb A1c MFr Bld: 6.9 % — ABNORMAL HIGH (ref 4.6–6.5)

## 2013-12-07 ENCOUNTER — Encounter: Payer: Self-pay | Admitting: Internal Medicine

## 2013-12-07 ENCOUNTER — Ambulatory Visit (INDEPENDENT_AMBULATORY_CARE_PROVIDER_SITE_OTHER): Payer: Commercial Managed Care - HMO | Admitting: Internal Medicine

## 2013-12-07 VITALS — BP 142/68 | HR 48 | Temp 97.9°F | Resp 16 | Wt 149.0 lb

## 2013-12-07 DIAGNOSIS — I10 Essential (primary) hypertension: Secondary | ICD-10-CM

## 2013-12-07 DIAGNOSIS — E785 Hyperlipidemia, unspecified: Secondary | ICD-10-CM

## 2013-12-07 DIAGNOSIS — E119 Type 2 diabetes mellitus without complications: Secondary | ICD-10-CM

## 2013-12-07 LAB — HM DIABETES FOOT EXAM: HM Diabetic Foot Exam: NORMAL

## 2013-12-07 MED ORDER — ATORVASTATIN CALCIUM 20 MG PO TABS: 20.0000 mg | ORAL_TABLET | Freq: Every day | ORAL | Status: DC

## 2013-12-07 NOTE — Patient Instructions (Addendum)
   Your diabetes is under excellent control on current regimen, and your cholesterol is excellent with  Every other day use of Crestor.    We are changing to a generic statin to save you $$$ on the Crestor (changing to generic Lipitor)  .  Please take it every night 20 mg   You can use aleve twice daily along with 4 tylenol daily  Ice the elbow after use to prevent tendonitis  Return  After July 4th  for fasting labs,, followed by office visit

## 2013-12-07 NOTE — Progress Notes (Signed)
Pre-visit discussion using our clinic review tool. No additional management support is needed unless otherwise documented below in the visit note.  

## 2013-12-07 NOTE — Assessment & Plan Note (Signed)
Tolerated Crestor every other day samples ,  With reduction in LDL  To 70.  Starting lipitor 40 mg   Lab Results  Component Value Date   CHOL 164 10/19/2013   HDL 49 10/19/2013   LDLCALC 70 10/19/2013   LDLDIRECT 150.2 08/17/2013   TRIG 224* 10/19/2013   CHOLHDL 3.3 10/19/2013

## 2013-12-07 NOTE — Assessment & Plan Note (Signed)
Well-controlled on current medications.  hemoglobin A1c has been consistently less than 7.0 . she is up-to-date on eye exams and her foot exam is normal.  Will repeat his urine microalbumin to creatinine ratio at next visit. she is on the appropriate medications.  Lab Results  Component Value Date   HGBA1C 6.9* 11/30/2013   Lab Results  Component Value Date   MICROALBUR 0.4 08/17/2013

## 2013-12-07 NOTE — Assessment & Plan Note (Signed)
Well controlled on current regimen. Renal function stable, no changes today. 

## 2013-12-07 NOTE — Progress Notes (Signed)
Patient ID: [name], female   DOB: May 15, 1941, 73 y.o.   MRN: [id number]  Patient Active Problem List   Diagnosis Date Noted  . Eczema of hand 05/11/2013  . Hematuria, microscopic 03/26/2013  . Overweight (BMI 25.0-29.9) 03/26/2013  . Screening for cervical cancer 11/19/2011  . Routine general medical examination at a health care facility 05/02/2011  . DIABETES MELLITUS, TYPE II 11/09/2010  . HYPOTHYROIDISM 02/06/2007  . HYPERLIPIDEMIA 02/06/2007  . HYPERTENSION 02/06/2007  . GERD 02/06/2007  . OSTEOPOROSIS 02/06/2007    Subjective:  CC:   Chief Complaint  Patient presents with  . Follow-up  . Diabetes    HPI:   [name] is a 73 y.o. female who presents for Dm follow up .  Sugars have ben slightly elvated at times but not regularly  Has been walking 1 mile daily  Tolerated trial of every other day Crestor without myalgias or nausea  lwerred her LDL to 70  But has run out of samples ,  Willing to resume    Past Medical History  Diagnosis Date  . GERD (gastroesophageal reflux disease)   . Hyperlipidemia   . Hypertension   . Thyroid disease   . Osteoporosis   . Diabetes mellitus     Past Surgical History  Procedure Laterality Date  . Vaginal delivery    . Cervical polypectomy    . Breast cyst aspiration    . Humerus fracture surgery    . Wrist fracture surgery         The following portions of the patient's history were reviewed and updated as appropriate: Allergies, current medications, and problem list.    Review of Systems:   Patient denies headache, fevers, malaise, unintentional weight loss, skin rash, eye pain, sinus congestion and sinus pain, sore throat, dysphagia,  hemoptysis , cough, dyspnea, wheezing, chest pain, palpitations, orthopnea, edema, abdominal pain, nausea, melena, diarrhea, constipation, flank pain, dysuria, hematuria, urinary  Frequency, nocturia, numbness, tingling, seizures,  Focal weakness, Loss of consciousness,  Tremor,  insomnia, depression, anxiety, and suicidal ideation.     History   Social History  . Marital Status: Widowed    Spouse Name: N/A    Number of Children: 2  . 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 Smoking status: Never Smoker   . Smokeless tobacco: Never Used  . Alcohol Use: No  . Drug Use: No  . Sexual Activity: Not on file   Other Topics Concern  . Not on file   Social History Narrative   Widowed in spring of 2012   Lost brother also   Has living will   Son and daughter hold health care POA   Would accept resuscitation but no prolonged artificial ventilation   Not sure about feeding tube    Objective:  Filed Vitals:   12/07/13 0936  BP: 142/68  Pulse: 48  Temp: 97.9 F (36.6 C)  Resp: 16     General appearance: alert, cooperative and appears stated age Ears: normal TM's and external ear canals both ears Throat: lips, mucosa, and tongue normal; teeth and gums normal Neck: no adenopathy, no carotid bruit, supple, symmetrical, trachea midline and thyroid not enlarged, symmetric, no tenderness/mass/nodules Back: symmetric, no curvature. ROM normal. No CVA tenderness. Lungs: clear to auscultation bilaterally Heart: regular rate and rhythm, S1, S2 normal, no murmur, click, rub or gallop Abdomen: soft, non-tender; bowel sounds normal; no masses,  no organomegaly Pulses: 2+ and symmetric Skin: Skin color, texture, turgor normal. No rashes or lesions Lymph nodes: Cervical, supraclavicular, and axillary nodes normal.  Assessment and Plan:  DIABETES MELLITUS, TYPE II Well-controlled on current medications.  hemoglobin A1c has been consistently less than 7.0 . she is up-to-date on eye exams and her foot exam is normal.  Will repeat his urine microalbumin to creatinine ratio at next visit. she is on the appropriate medications.  Lab Results  Component Value Date   HGBA1C 6.9* 11/30/2013   Lab  Results  Component Value Date   MICROALBUR 0.4 08/17/2013      HYPERTENSION Well controlled on current regimen. Renal function stable, no changes today.  HYPERLIPIDEMIA Tolerated Crestor every other day samples ,  With reduction in LDL  To 70.  Starting lipitor 40 mg   Lab Results  Component Value Date   CHOL 164 10/19/2013   HDL 49 10/19/2013   LDLCALC 70 10/19/2013   LDLDIRECT 150.2 08/17/2013   TRIG 224* 10/19/2013   CHOLHDL 3.3 10/19/2013       A total of 25 minutes of face to face time was spent with patient more than half of which was spent in counselling and coordination of care    Updated Medication List Outpatient Encounter Prescriptions as of 12/07/2013  Medication Sig  . ALPRAZolam (XANAX) 0.25 MG tablet Take 1 tablet (0.25 mg total) by mouth every 6 (six) hours as needed for anxiety.  Marland Kitchen aspirin 81 MG tablet Take 81 mg by mouth daily.  . calcium-vitamin D (OSCAL WITH D) 500-200 MG-UNIT per tablet Take 1 tablet by mouth daily.    Marland Kitchen glucose blood test strip Check sugars twice daily,ascensia contour micro fill test strips, Dx 250.00  . levothyroxine (SYNTHROID, LEVOTHROID) 75 MCG tablet Take 1 tablet (75 mcg total) by mouth daily.  Marland Kitchen losartan (COZAAR) 50 MG tablet Take 1 tablet (50 mg total) by mouth daily.  Marland Kitchen Specialty Vitamins Products (MAGNESIUM, AMINO ACID CHELATE,) 133 MG tablet Take 1 tablet by mouth daily.  . [DISCONTINUED] rosuvastatin (CRESTOR) 10 MG tablet Every other day  . atorvastatin (LIPITOR) 20 MG tablet Take 1 tablet (20 mg total) by mouth daily.  . Multiple Vitamin (MULTIVITAMIN) tablet Take 1 tablet by mouth daily.    . Tdap (BOOSTRIX) 5-2.5-18.5 LF-MCG/0.5 injection Inject 0.5 mLs into the muscle once.  . zoster vaccine live, PF, (ZOSTAVAX) 78938 UNT/0.65ML injection Inject 19,400 Units into the skin once.     Orders Placed This Encounter  Procedures  . HM DIABETES FOOT EXAM    Return for follow up diabetes.

## 2013-12-25 ENCOUNTER — Telehealth: Payer: Self-pay

## 2013-12-25 NOTE — Telephone Encounter (Signed)
Relevant patient education mailed to patient.  

## 2014-01-14 ENCOUNTER — Telehealth: Payer: Self-pay | Admitting: Internal Medicine

## 2014-01-14 NOTE — Telephone Encounter (Signed)
Pt came in today  She had an appointment with [facility] 01/14/14  She has [employer] insurance and needs referral   On her check out sheet it has Diagnoses Description   Cataract nuclear sclerotic both eyes Code [id number]  (snomed-ct)  [facility] Phone [phone]  Fax [phone]

## 2014-01-19 LAB — HM DIABETES EYE EXAM

## 2014-01-22 ENCOUNTER — Other Ambulatory Visit: Payer: Self-pay | Admitting: Internal Medicine

## 2014-02-24 ENCOUNTER — Telehealth: Payer: Self-pay

## 2014-02-24 NOTE — Telephone Encounter (Signed)
The patient called stating she has an apt with [facility] on June 25th at 3:45pm  [place] authorized [phone]

## 2014-02-25 ENCOUNTER — Emergency Department: Payer: Self-pay | Admitting: Emergency Medicine

## 2014-02-25 LAB — BASIC METABOLIC PANEL WITH GFR
Anion Gap: 7
BUN: 18 mg/dL
Calcium, Total: 10 mg/dL
Chloride: 104 mmol/L
Co2: 25 mmol/L
Creatinine: 0.58 mg/dL — ABNORMAL LOW
EGFR (African American): >60
EGFR (Non-African Amer.): >60
Glucose: 143 mg/dL — ABNORMAL HIGH
Osmolality: 276
Potassium: 4 mmol/L
Sodium: 136 mmol/L

## 2014-02-25 LAB — CBC WITH DIFFERENTIAL/PLATELET
Basophil #: 0 10*3/uL
Basophil %: 0.3 %
Eosinophil #: 0.2 10*3/uL
Eosinophil %: 1.2 %
HCT: 39.5 %
HGB: 13.6 g/dL
Lymphocyte %: 15.1 %
Lymphs Abs: 2 10*3/uL
MCH: 29.2 pg
MCHC: 34.5 g/dL
MCV: 84 fL
Monocyte #: 0.8 10*3/uL
Monocyte %: 6.2 %
Neutrophil #: 10.3 10*3/uL — ABNORMAL HIGH
Neutrophil %: 77.2 %
Platelet: 175 10*3/uL
RBC: 4.67 X10 6/mm 3
RDW: 13.8 %
WBC: 13.3 10*3/uL — ABNORMAL HIGH

## 2014-02-27 ENCOUNTER — Emergency Department: Payer: Self-pay | Admitting: Emergency Medicine

## 2014-03-02 ENCOUNTER — Ambulatory Visit: Payer: Commercial Managed Care - HMO | Admitting: Adult Health

## 2014-03-15 ENCOUNTER — Telehealth: Payer: Self-pay | Admitting: *Deleted

## 2014-03-15 DIAGNOSIS — E119 Type 2 diabetes mellitus without complications: Secondary | ICD-10-CM

## 2014-03-15 NOTE — Telephone Encounter (Signed)
Pt is coming in tomorrow what labs and dx?  

## 2014-03-16 ENCOUNTER — Other Ambulatory Visit (INDEPENDENT_AMBULATORY_CARE_PROVIDER_SITE_OTHER): Payer: Commercial Managed Care - HMO

## 2014-03-16 DIAGNOSIS — E119 Type 2 diabetes mellitus without complications: Secondary | ICD-10-CM

## 2014-03-16 LAB — LIPID PANEL
Cholesterol: 134 mg/dL (ref 0–200)
HDL: 47.9 mg/dL
LDL Cholesterol: 62 mg/dL (ref 0–99)
NonHDL: 86.1
Total CHOL/HDL Ratio: 3
Triglycerides: 123 mg/dL (ref 0.0–149.0)
VLDL: 24.6 mg/dL (ref 0.0–40.0)

## 2014-03-16 LAB — COMPREHENSIVE METABOLIC PANEL WITH GFR
ALT: 16 U/L (ref 0–35)
AST: 16 U/L (ref 0–37)
Albumin: 4.1 g/dL (ref 3.5–5.2)
Alkaline Phosphatase: 86 U/L (ref 39–117)
BUN: 28 mg/dL — ABNORMAL HIGH (ref 6–23)
CO2: 29 meq/L (ref 19–32)
Calcium: 9.7 mg/dL (ref 8.4–10.5)
Chloride: 107 meq/L (ref 96–112)
Creatinine, Ser: 0.7 mg/dL (ref 0.4–1.2)
GFR: 87.31 mL/min
Glucose, Bld: 171 mg/dL — ABNORMAL HIGH (ref 70–99)
Potassium: 4.9 meq/L (ref 3.5–5.1)
Sodium: 140 meq/L (ref 135–145)
Total Bilirubin: 0.7 mg/dL (ref 0.2–1.2)
Total Protein: 6.7 g/dL (ref 6.0–8.3)

## 2014-03-16 LAB — MICROALBUMIN / CREATININE URINE RATIO
CREATININE, U: 170.6 mg/dL
MICROALB/CREAT RATIO: 0.5 mg/g (ref 0.0–30.0)
Microalb, Ur: 0.9 mg/dL (ref 0.0–1.9)

## 2014-03-16 LAB — HEMOGLOBIN A1C: Hgb A1c MFr Bld: 7 % — ABNORMAL HIGH (ref 4.6–6.5)

## 2014-03-18 ENCOUNTER — Encounter: Payer: Self-pay | Admitting: Internal Medicine

## 2014-03-18 ENCOUNTER — Ambulatory Visit (INDEPENDENT_AMBULATORY_CARE_PROVIDER_SITE_OTHER): Payer: Commercial Managed Care - HMO | Admitting: Internal Medicine

## 2014-03-18 VITALS — BP 126/60 | HR 55 | Temp 97.8°F | Resp 16 | Ht 63.25 in | Wt 145.5 lb

## 2014-03-18 DIAGNOSIS — I10 Essential (primary) hypertension: Secondary | ICD-10-CM

## 2014-03-18 DIAGNOSIS — L03119 Cellulitis of unspecified part of limb: Secondary | ICD-10-CM

## 2014-03-18 DIAGNOSIS — E785 Hyperlipidemia, unspecified: Secondary | ICD-10-CM

## 2014-03-18 DIAGNOSIS — Z872 Personal history of diseases of the skin and subcutaneous tissue: Secondary | ICD-10-CM

## 2014-03-18 DIAGNOSIS — Z1239 Encounter for other screening for malignant neoplasm of breast: Secondary | ICD-10-CM

## 2014-03-18 DIAGNOSIS — L03114 Cellulitis of left upper limb: Secondary | ICD-10-CM

## 2014-03-18 DIAGNOSIS — E119 Type 2 diabetes mellitus without complications: Secondary | ICD-10-CM

## 2014-03-18 DIAGNOSIS — L02519 Cutaneous abscess of unspecified hand: Secondary | ICD-10-CM

## 2014-03-18 LAB — CBC WITH DIFFERENTIAL/PLATELET
Basophils Absolute: 0 10*3/uL (ref 0.0–0.1)
Basophils Relative: 0.2 % (ref 0.0–3.0)
Eosinophils Absolute: 0.2 10*3/uL (ref 0.0–0.7)
Eosinophils Relative: 3.1 % (ref 0.0–5.0)
HCT: 40 % (ref 36.0–46.0)
Hemoglobin: 13.3 g/dL (ref 12.0–15.0)
Lymphocytes Relative: 23.8 % (ref 12.0–46.0)
Lymphs Abs: 1.7 10*3/uL (ref 0.7–4.0)
MCHC: 33.4 g/dL (ref 30.0–36.0)
MCV: 86.9 fl (ref 78.0–100.0)
Monocytes Absolute: 0.6 10*3/uL (ref 0.1–1.0)
Monocytes Relative: 8.1 % (ref 3.0–12.0)
Neutro Abs: 4.6 10*3/uL (ref 1.4–7.7)
Neutrophils Relative %: 64.8 % (ref 43.0–77.0)
Platelets: 183 10*3/uL (ref 150.0–400.0)
RBC: 4.6 Mil/uL (ref 3.87–5.11)
RDW: 14.3 % (ref 11.5–15.5)
WBC: 7.1 10*3/uL (ref 4.0–10.5)

## 2014-03-18 LAB — SEDIMENTATION RATE: Sed Rate: 11 mm/h (ref 0–22)

## 2014-03-18 LAB — C-REACTIVE PROTEIN: CRP: <0.5 mg/dL (ref 0.5–20.0)

## 2014-03-18 NOTE — Progress Notes (Signed)
Patient ID: [name], female   DOB: [birth year], 73 y.o.   MRN: [id number]  Patient Active Problem List   Diagnosis Date Noted  . History of cellulitis of skin with lymphangitis 03/20/2014  . Eczema of hand 05/11/2013  . Hematuria, microscopic 03/26/2013  . Overweight (BMI 25.0-29.9) 03/26/2013  . Screening for cervical cancer 11/19/2011  . Routine general medical examination at a health care facility 05/02/2011  . DIABETES MELLITUS, TYPE II 11/09/2010  . HYPOTHYROIDISM 02/06/2007  . HYPERLIPIDEMIA 02/06/2007  . HYPERTENSION 02/06/2007  . GERD 02/06/2007  . OSTEOPOROSIS 02/06/2007    Subjective:  CC:   Chief Complaint  Patient presents with  . Follow-up  . Diabetes    HPI:   [name] is a 73 y.o. female who presents for Follow up on DM 91mhypertension and hyperlipidemia.   June 25th  ER visit for cellulitis of left hand whch started as a blister ,which became purulent so she popped it and wrapped arm. Within 4 hours she developed red streak up arm and was sent to ED by dermatology and given IV antibiotics and sent home after one dose with 10 days of a QID abx (not sure what,  Possibly cephalexin ).  Had follow up 48 hours later in ED, then with Dr GElveria [facility] following [facility] . Finally resolved, but still has 3 small bumps in palm of hand   Blood sugars have been elevated for her,  nonfastings have been up to 180  To 212 and her a1c was 7.0 yesterday    Past Medical History  Diagnosis Date  . GERD (gastroesophageal reflux disease)   . Hyperlipidemia   . Hypertension   . Thyroid disease   . Osteoporosis   . Diabetes mellitus     Past Surgical History  Procedure Laterality Date  . Vaginal delivery    . Cervical polypectomy    . Breast cyst aspiration    . Humerus fracture surgery    . Wrist fracture surgery         The following portions of the patient's history were reviewed and updated as appropriate: Allergies, current medications, and  problem list.    Review of Systems:   Patient denies headache, fevers, malaise, unintentional weight loss, skin rash, eye pain, sinus congestion and sinus pain, sore throat, dysphagia,  hemoptysis , cough, dyspnea, wheezing, chest pain, palpitations, orthopnea, edema, abdominal pain, nausea, melena, diarrhea, constipation, flank pain, dysuria, hematuria, urinary  Frequency, nocturia, numbness, tingling, seizures,  Focal weakness, Loss of consciousness,  Tremor, insomnia, depression, anxiety, and suicidal ideation.     History   Social History  . Marital Status: Widowed    Spouse Name: N/A    Number of Children: 2  . 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 Smoking status: Never Smoker   . Smokeless tobacco: Never Used  . Alcohol Use: No  . Drug Use: No  . Sexual Activity: Not on file   Other Topics Concern  . Not on file   Social History Narrative   Widowed in spring of 2012   Lost brother also   Has living will   Son and daughter hold health care POA   Would accept resuscitation but no prolonged artificial ventilation   Not sure about feeding tube    Objective:  Filed Vitals:   03/18/14 0931  BP: 126/60  Pulse: 55  Temp: 97.8  F (36.6 C)  Resp: 16     General appearance: alert, cooperative and appears stated age Ears: normal TM's and external ear canals both ears Throat: lips, mucosa, and tongue normal; teeth and gums normal Neck: no adenopathy, no carotid bruit, supple, symmetrical, trachea midline and thyroid not enlarged, symmetric, no tenderness/mass/nodules Back: symmetric, no curvature. ROM normal. No CVA tenderness. Lungs: clear to auscultation bilaterally Heart: regular rate and rhythm, S1, S2 normal, no murmur, click, rub or gallop Abdomen: soft, non-tender; bowel sounds normal; no masses,  no organomegaly Pulses: 2+ and symmetric Skin: Skin color, texture, turgor normal. No rashes  or lesions Lymph nodes: Cervical, supraclavicular, and axillary nodes normal.  Assessment and Plan:  DIABETES MELLITUS, TYPE II Well-controlled on current medications.  hemoglobin A1c is at goal. . she is up-to-date on eye exams and her foot exam is normal.  Will repeat his urine microalbumin to creatinine ratio at next visit. she is on the appropriate medications.  Lab Results  Component Value Date   HGBA1C 7.0* 03/16/2014   Lab Results  Component Value Date   MICROALBUR 0.9 03/16/2014      HYPERLIPIDEMIA Well controlled on lipitor 40 mg  Lab Results  Component Value Date   CHOL 134 03/16/2014   HDL 47.90 03/16/2014   LDLCALC 62 03/16/2014   LDLDIRECT 150.2 08/17/2013   TRIG 123.0 03/16/2014   CHOLHDL 3 03/16/2014          HYPERTENSION Well controlled on current regimen. Renal function stable, no changes today.  Lab Results  Component Value Date   CREATININE 0.7 03/16/2014   Lab Results  Component Value Date   NA 140 03/16/2014   K 4.9 03/16/2014   CL 107 03/16/2014   CO2 29 03/16/2014     History of cellulitis of skin with lymphangitis Now resolved, with ESR and CRP normal.   Updated Medication List Outpatient Encounter Prescriptions as of 03/18/2014  Medication Sig  . ALPRAZolam (XANAX) 0.25 MG tablet Take 1 tablet (0.25 mg total) by mouth every 6 (six) hours as needed for anxiety.  Marland Kitchen aspirin 81 MG tablet Take 81 mg by mouth daily.  Marland Kitchen atorvastatin (LIPITOR) 20 MG tablet Take 1 tablet (20 mg total) by mouth daily.  . calcium-vitamin D (OSCAL WITH D) 500-200 MG-UNIT per tablet Take 1 tablet by mouth daily.    Marland Kitchen glucose blood test strip Check sugars twice daily,ascensia contour micro fill test strips, Dx 250.00  . levothyroxine (SYNTHROID, LEVOTHROID) 75 MCG tablet TAKE ONE TABLET EVERY DAY  . losartan (COZAAR) 50 MG tablet TAKE ONE TABLET BY MOUTH EVERY DAY  . Multiple Vitamin (MULTIVITAMIN) tablet Take 1 tablet by mouth daily.    Marland Kitchen Specialty Vitamins Products  (MAGNESIUM, AMINO ACID CHELATE,) 133 MG tablet Take 1 tablet by mouth daily.  . Tdap (BOOSTRIX) 5-2.5-18.5 LF-MCG/0.5 injection Inject 0.5 mLs into the muscle once.  . zoster vaccine live, PF, (ZOSTAVAX) 54562 UNT/0.65ML injection Inject 19,400 Units into the skin once.     Orders Placed This Encounter  Procedures  . MM DIGITAL SCREENING BILATERAL  . Sedimentation rate  . C-reactive protein  . CBC with Differential    Return in about 3 months (around 06/18/2014) for follow up diabetes.

## 2014-03-18 NOTE — Progress Notes (Signed)
Pre-visit discussion using our clinic review tool. No additional management support is needed unless otherwise documented below in the visit note.  

## 2014-03-18 NOTE — Patient Instructions (Signed)
You are doing well.  Your diabetes remains under excellent control currently. .Please return in 3 months for follow up on diabetes and make sure you are seeing your eye doctor at least once a year.   If the blood tests today suggest a still simmering infection,  I will call you to schedule an MRI of your left hand  We will schedule your mammogram at [facility] in late august  Diabetes and Exercise Exercising regularly is important. It is not just about losing weight. It has many health benefits, such as:  Improving your overall fitness, flexibility, and endurance.  Increasing your bone density.  Helping with weight control.  Decreasing your body fat.  Increasing your muscle strength.  Reducing stress and tension.  Improving your overall health. People with diabetes who exercise gain additional benefits because exercise:  Reduces appetite.  Improves the body's use of blood sugar (glucose).  Helps lower or control blood glucose.  Decreases blood pressure.  Helps control blood lipids (such as cholesterol and triglycerides).  Improves the body's use of the hormone insulin by:  Increasing the body's insulin sensitivity.  Reducing the body's insulin needs.  Decreases the risk for heart disease because exercising:  Lowers cholesterol and triglycerides levels.  Increases the levels of good cholesterol (such as high-density lipoproteins [HDL]) in the body.  Lowers blood glucose levels. YOUR ACTIVITY PLAN  Choose an activity that you enjoy and set realistic goals. Your health care provider or diabetes educator can help you make an activity plan that works for you. You can break activities into 2 or 3 sessions throughout the day. Doing so is as good as one long session. Exercise ideas include:  Taking the dog for a walk.  Taking the stairs instead of the elevator.  Dancing to your favorite song.  Doing your favorite exercise with a friend. RECOMMENDATIONS FOR EXERCISING  WITH TYPE 1 OR TYPE 2 DIABETES   Check your blood glucose before exercising. If blood glucose levels are greater than 240 mg/dL, check for urine ketones. Do not exercise if ketones are present.  Avoid injecting insulin into areas of the body that are going to be exercised. For example, avoid injecting insulin into:  The arms when playing tennis.  The legs when jogging.  Keep a record of:  Food intake before and after you exercise.  Expected peak times of insulin action.  Blood glucose levels before and after you exercise.  The type and amount of exercise you have done.  Review your records with your health care provider. Your health care provider will help you to develop guidelines for adjusting food intake and insulin amounts before and after exercising.  If you take insulin or oral hypoglycemic agents, watch for signs and symptoms of hypoglycemia. They include:  Dizziness.  Shaking.  Sweating.  Chills.  Confusion.  Drink plenty of water while you exercise to prevent dehydration or heat stroke. Body water is lost during exercise and must be replaced.  Talk to your health care provider before starting an exercise program to make sure it is safe for you. Remember, almost any type of activity is better than none. Document Released: 11/10/2003 Document Revised: 04/22/2013 Document Reviewed: 01/27/2013 [facility] Patient Information 2015 [place], [place]. This information is not intended to replace advice given to you by your health care provider. Make sure you discuss any questions you have with your health care provider.

## 2014-03-20 DIAGNOSIS — Z872 Personal history of diseases of the skin and subcutaneous tissue: Secondary | ICD-10-CM | POA: Insufficient documentation

## 2014-03-20 NOTE — Assessment & Plan Note (Signed)
Well-controlled on current medications.  hemoglobin A1c is at goal. . she is up-to-date on eye exams and her foot exam is normal.  Will repeat his urine microalbumin to creatinine ratio at next visit. she is on the appropriate medications.  Lab Results  Component Value Date   HGBA1C 7.0* 03/16/2014   Lab Results  Component Value Date   MICROALBUR 0.9 03/16/2014

## 2014-03-20 NOTE — Assessment & Plan Note (Signed)
Well controlled on current regimen. Renal function stable, no changes today.  Lab Results  Component Value Date   CREATININE 0.7 03/16/2014   Lab Results  Component Value Date   NA 140 03/16/2014   K 4.9 03/16/2014   CL 107 03/16/2014   CO2 29 03/16/2014

## 2014-03-20 NOTE — Assessment & Plan Note (Signed)
Well controlled on lipitor 40 mg  Lab Results  Component Value Date   CHOL 134 03/16/2014   HDL 47.90 03/16/2014   LDLCALC 62 03/16/2014   LDLDIRECT 150.2 08/17/2013   TRIG 123.0 03/16/2014   CHOLHDL 3 03/16/2014

## 2014-03-20 NOTE — Assessment & Plan Note (Signed)
Now resolved, with ESR and CRP normal.

## 2014-05-12 NOTE — Telephone Encounter (Signed)
Per [employer] portal this was authorized for 6 visits from 01/14/14-07/16/14 auth # [id number]

## 2014-06-16 ENCOUNTER — Ambulatory Visit: Payer: Self-pay | Admitting: Internal Medicine

## 2014-06-16 LAB — HM MAMMOGRAPHY: HM Mammogram: NEGATIVE

## 2014-06-17 ENCOUNTER — Encounter: Payer: Self-pay | Admitting: *Deleted

## 2014-06-21 ENCOUNTER — Ambulatory Visit (INDEPENDENT_AMBULATORY_CARE_PROVIDER_SITE_OTHER): Payer: Commercial Managed Care - HMO | Admitting: Internal Medicine

## 2014-06-21 ENCOUNTER — Encounter: Payer: Self-pay | Admitting: Internal Medicine

## 2014-06-21 VITALS — BP 140/74 | HR 56 | Temp 98.2°F | Resp 16 | Ht 63.25 in | Wt 149.8 lb

## 2014-06-21 DIAGNOSIS — E785 Hyperlipidemia, unspecified: Secondary | ICD-10-CM

## 2014-06-21 DIAGNOSIS — E118 Type 2 diabetes mellitus with unspecified complications: Secondary | ICD-10-CM

## 2014-06-21 DIAGNOSIS — E119 Type 2 diabetes mellitus without complications: Secondary | ICD-10-CM

## 2014-06-21 DIAGNOSIS — Z872 Personal history of diseases of the skin and subcutaneous tissue: Secondary | ICD-10-CM

## 2014-06-21 DIAGNOSIS — E663 Overweight: Secondary | ICD-10-CM

## 2014-06-21 DIAGNOSIS — E1169 Type 2 diabetes mellitus with other specified complication: Secondary | ICD-10-CM

## 2014-06-21 DIAGNOSIS — L309 Dermatitis, unspecified: Secondary | ICD-10-CM

## 2014-06-21 DIAGNOSIS — Z23 Encounter for immunization: Secondary | ICD-10-CM

## 2014-06-21 LAB — COMPREHENSIVE METABOLIC PANEL WITH GFR
ALT: 17 U/L (ref 0–35)
AST: 18 U/L (ref 0–37)
Albumin: 3.8 g/dL (ref 3.5–5.2)
Alkaline Phosphatase: 99 U/L (ref 39–117)
BUN: 23 mg/dL (ref 6–23)
CO2: 25 meq/L (ref 19–32)
Calcium: 10 mg/dL (ref 8.4–10.5)
Chloride: 103 meq/L (ref 96–112)
Creatinine, Ser: 0.7 mg/dL (ref 0.4–1.2)
GFR: 93.38 mL/min
Glucose, Bld: 145 mg/dL — ABNORMAL HIGH (ref 70–99)
Potassium: 4.8 meq/L (ref 3.5–5.1)
Sodium: 141 meq/L (ref 135–145)
Total Bilirubin: 0.8 mg/dL (ref 0.2–1.2)
Total Protein: 7.2 g/dL (ref 6.0–8.3)

## 2014-06-21 LAB — HEMOGLOBIN A1C: Hgb A1c MFr Bld: 6.8 % — ABNORMAL HIGH (ref 4.6–6.5)

## 2014-06-21 NOTE — Progress Notes (Signed)
Patient ID: [name], female   DOB: 1941/02/25, 73 y.o.   MRN: [id number]  Patient Active Problem List   Diagnosis Date Noted  . History of cellulitis of skin with lymphangitis 03/20/2014  . Eczema of hand 05/11/2013  . Hematuria, microscopic 03/26/2013  . Overweight (BMI 25.0-29.9) 03/26/2013  . Screening for cervical cancer 11/19/2011  . Routine general medical examination at a health care facility 05/02/2011  . DM type 2, controlled, with complication 45/62/5638  . HYPOTHYROIDISM 02/06/2007  . Hyperlipidemia associated with type 2 diabetes mellitus 02/06/2007  . HYPERTENSION 02/06/2007  . GERD 02/06/2007  . OSTEOPOROSIS 02/06/2007    Subjective:  CC:   Chief Complaint  Patient presents with  . Follow-up    Saw dermatologist last week,  . 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    HPI:   [name] is a 73 y.o. female who presents for  3 month follow up on DM Type 2 ,  hyperlipidemia and hypertension   Fasting sugars have been elevated due to stress of family .  Has been cooking for extended family due to daughter's convalescence with her from surgery. .Has not been able to engage in regular exercise due to schedule of daughter and granddaughter,     Still having recurrent furuncles on forearms and axilla,  Dr Phillip Heal had to incise and drain one recently and she was placed on antibiotics.   .   She is wearing cotton gloves and emollient underneath the gloves .  Using rubber gloves when washing and food prep latex free gloves      Past Medical History  Diagnosis Date  . GERD (gastroesophageal reflux disease)   . Hyperlipidemia   . Hypertension   . Thyroid disease   . Osteoporosis   . Diabetes mellitus     Past Surgical History  Procedure Laterality Date  . Vaginal delivery    . Cervical polypectomy    . Breast cyst aspiration    . Humerus fracture surgery    . Wrist fracture surgery         The  following portions of the patient's history were reviewed and updated as appropriate: Allergies, current medications, and problem list.    Review of Systems:   Patient denies headache, fevers, malaise, unintentional weight loss, skin rash, eye pain, sinus congestion and sinus pain, sore throat, dysphagia,  hemoptysis , cough, dyspnea, wheezing, chest pain, palpitations, orthopnea, edema, abdominal pain, nausea, melena, diarrhea, constipation, flank pain, dysuria, hematuria, urinary  Frequency, nocturia, numbness, tingling, seizures,  Focal weakness, Loss of consciousness,  Tremor, insomnia, depression, anxiety, and suicidal ideation.     History   Social History  . Marital Status: Widowed    Spouse Name: N/A    Number of Children: 2  . 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 Smoking status: Never Smoker   . Smokeless tobacco: Never Used  . Alcohol Use: No  . Drug Use: No  . Sexual Activity: Not on file   Other Topics Concern  . Not on file   Social History Narrative   Widowed in spring of 2012   Lost brother also   Has living will   Son and daughter hold health care POA   Would accept resuscitation but no prolonged artificial ventilation   Not sure about feeding tube    Objective:  Filed Vitals:  06/21/14 0909  BP: 140/74  Pulse: 56  Temp: 98.2 F (36.8 C)  Resp: 16     General appearance: alert, cooperative and appears stated age Ears: normal TM's and external ear canals both ears Throat: lips, mucosa, and tongue normal; teeth and gums normal Neck: no adenopathy, no carotid bruit, supple, symmetrical, trachea midline and thyroid not enlarged, symmetric, no tenderness/mass/nodules Back: symmetric, no curvature. ROM normal. No CVA tenderness. Lungs: clear to auscultation bilaterally Heart: regular rate and rhythm, S1, S2 normal, no murmur, click, rub or gallop Abdomen: soft, non-tender; bowel  sounds normal; no masses,  no organomegaly Pulses: 2+ and symmetric Skin: Skin color, texture, turgor normal. No rashes or lesions Lymph nodes: Cervical, supraclavicular, and axillary nodes normal.  Assessment and Plan:  DM type 2, controlled, with complication Well-controlled on current medications.  hemoglobin A1c is at goal. . she is up-to-date on eye exams and her foot exam is normal.  Will repeat her urine microalbumin to creatinine ratio at next visit. she is on the appropriate medications.  Lab Results  Component Value Date   HGBA1C 6.8* 06/21/2014   Lab Results  Component Value Date   MICROALBUR 0.9 03/16/2014        Eczema of hand Managed by Dr Phillip Heal. 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  Awaiting records.   History of cellulitis of skin with lymphangitis Secondary to furuncles.  Resolve with I & D and abx   Overweight (BMI 25.0-29.9) I have addressed  BMI and recommended wt loss of 10% of body weigh over the next 6 months using a low glycemic index diet and regular exercise a minimum of 5 days per week.    Hyperlipidemia associated with type 2 diabetes mellitus Well controlled on lipitor 40 mg .   Lab Results  Component Value Date   CHOL 134 03/16/2014   HDL 47.90 03/16/2014   LDLCALC 62 03/16/2014   LDLDIRECT 150.2 08/17/2013   TRIG 123.0 03/16/2014   CHOLHDL 3 03/16/2014   Lab Results  Component Value Date   ALT 17 06/21/2014   AST 18 06/21/2014   ALKPHOS 99 06/21/2014   BILITOT 0.8 06/21/2014             Updated Medication List Outpatient Encounter Prescriptions as of 06/21/2014  Medication Sig  . ALPRAZolam (XANAX) 0.25 MG tablet Take 1 tablet (0.25 mg total) by mouth every 6 (six) hours as needed for anxiety.  Marland Kitchen aspirin 81 MG tablet Take 81 mg by mouth daily.  Marland Kitchen atorvastatin (LIPITOR) 20 MG tablet Take 1 tablet (20 mg total) by mouth daily.  . calcium-vitamin D (OSCAL WITH D) 500-200 MG-UNIT per tablet Take 1 tablet by  mouth daily.    . cyanocobalamin 500 MCG tablet Take 500 mcg by mouth daily.  Marland Kitchen glucose blood test strip Check sugars twice daily,ascensia contour micro fill test strips, Dx 250.00  . levothyroxine (SYNTHROID, LEVOTHROID) 75 MCG tablet TAKE ONE TABLET EVERY DAY  . losartan (COZAAR) 50 MG tablet TAKE ONE TABLET BY MOUTH EVERY DAY  . [DISCONTINUED] Tdap (BOOSTRIX) 5-2.5-18.5 LF-MCG/0.5 injection Inject 0.5 mLs into the muscle once.  . zoster vaccine live, PF, (ZOSTAVAX) 01027 UNT/0.65ML injection Inject 19,400 Units into the skin once.  . [DISCONTINUED] Multiple Vitamin (MULTIVITAMIN) tablet Take 1 tablet by mouth daily.    . [DISCONTINUED] Specialty Vitamins Products (MAGNESIUM, AMINO ACID CHELATE,) 133 MG tablet Take 1 tablet by mouth daily.     Orders Placed This Encounter  Procedures  . Pneumococcal  conjugate vaccine 13-valent  . Comprehensive metabolic panel  . Hemoglobin A1c  . HM DIABETES EYE EXAM    Return in about 3 months (around 09/21/2014) for follow up diabetes.

## 2014-06-21 NOTE — Patient Instructions (Signed)
Diabetes and Exercise Exercising regularly is important. It is not just about losing weight. It has many health benefits, such as:  Improving your overall fitness, flexibility, and endurance.  Increasing your bone density.  Helping with weight control.  Decreasing your body fat.  Increasing your muscle strength.  Reducing stress and tension.  Improving your overall health. People with diabetes who exercise gain additional benefits because exercise:  Reduces appetite.  Improves the body's use of blood sugar (glucose).  Helps lower or control blood glucose.  Decreases blood pressure.  Helps control blood lipids (such as cholesterol and triglycerides).  Improves the body's use of the hormone insulin by:  Increasing the body's insulin sensitivity.  Reducing the body's insulin needs.  Decreases the risk for heart disease because exercising:  Lowers cholesterol and triglycerides levels.  Increases the levels of good cholesterol (such as high-density lipoproteins [HDL]) in the body.  Lowers blood glucose levels. YOUR ACTIVITY PLAN  Choose an activity that you enjoy and set realistic goals. Your health care provider or diabetes educator can help you make an activity plan that works for you. Exercise regularly as directed by your health care provider. This includes:  Performing resistance training twice a week such as push-ups, sit-ups, lifting weights, or using resistance bands.  Performing 150 minutes of cardio exercises each week such as walking, running, or playing sports.  Staying active and spending no more than 90 minutes at one time being inactive. Even short bursts of exercise are good for you. Three 10-minute sessions spread throughout the day are just as beneficial as a single 30-minute session. Some exercise ideas include:  Taking the dog for a walk.  Taking the stairs instead of the elevator.  Dancing to your favorite song.  Doing an exercise  video.  Doing your favorite exercise with a friend. RECOMMENDATIONS FOR EXERCISING WITH TYPE 1 OR TYPE 2 DIABETES   Check your blood glucose before exercising. If blood glucose levels are greater than 240 mg/dL, check for urine ketones. Do not exercise if ketones are present.  Avoid injecting insulin into areas of the body that are going to be exercised. For example, avoid injecting insulin into:  The arms when playing tennis.  The legs when jogging.  Keep a record of:  Food intake before and after you exercise.  Expected peak times of insulin action.  Blood glucose levels before and after you exercise.  The type and amount of exercise you have done.  Review your records with your health care provider. Your health care provider will help you to develop guidelines for adjusting food intake and insulin amounts before and after exercising.  If you take insulin or oral hypoglycemic agents, watch for signs and symptoms of hypoglycemia. They include:  Dizziness.  Shaking.  Sweating.  Chills.  Confusion.  Drink plenty of water while you exercise to prevent dehydration or heat stroke. Body water is lost during exercise and must be replaced.  Talk to your health care provider before starting an exercise program to make sure it is safe for you. Remember, almost any type of activity is better than none. Document Released: 11/10/2003 Document Revised: 01/04/2014 Document Reviewed: 01/27/2013 [employer] Patient Information 2015 [employer]. This information is not intended to replace advice given to you by your health care provider. Make sure you discuss any questions you have with your health care provider.  

## 2014-06-21 NOTE — Progress Notes (Signed)
Pre-visit discussion using our clinic review tool. No additional management support is needed unless otherwise documented below in the visit note.  

## 2014-06-22 ENCOUNTER — Encounter: Payer: Self-pay | Admitting: Internal Medicine

## 2014-06-22 NOTE — Assessment & Plan Note (Signed)
Managed by Dr Phillip Heal. 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  Awaiting records.

## 2014-06-22 NOTE — Assessment & Plan Note (Signed)
I have addressed  BMI and recommended wt loss of 10% of body weigh over the next 6 months using a low glycemic index diet and regular exercise a minimum of 5 days per week.   

## 2014-06-22 NOTE — Assessment & Plan Note (Signed)
Secondary to furuncles.  Resolve with I & D and abx

## 2014-06-22 NOTE — Assessment & Plan Note (Signed)
Well controlled on lipitor 40 mg .   Lab Results  Component Value Date   CHOL 134 03/16/2014   HDL 47.90 03/16/2014   LDLCALC 62 03/16/2014   LDLDIRECT 150.2 08/17/2013   TRIG 123.0 03/16/2014   CHOLHDL 3 03/16/2014   Lab Results  Component Value Date   ALT 17 06/21/2014   AST 18 06/21/2014   ALKPHOS 99 06/21/2014   BILITOT 0.8 06/21/2014

## 2014-06-22 NOTE — Assessment & Plan Note (Signed)
Well-controlled on current medications.  hemoglobin A1c is at goal. . she is up-to-date on eye exams and her foot exam is normal.  Will repeat her urine microalbumin to creatinine ratio at next visit. she is on the appropriate medications.  Lab Results  Component Value Date   HGBA1C 6.8* 06/21/2014   Lab Results  Component Value Date   MICROALBUR 0.9 03/16/2014

## 2014-06-23 ENCOUNTER — Encounter: Payer: Self-pay | Admitting: *Deleted

## 2014-06-29 ENCOUNTER — Encounter: Payer: Self-pay | Admitting: Internal Medicine

## 2014-08-26 ENCOUNTER — Other Ambulatory Visit: Payer: Self-pay | Admitting: Internal Medicine

## 2014-08-28 ENCOUNTER — Other Ambulatory Visit: Payer: Self-pay | Admitting: Internal Medicine

## 2014-09-20 ENCOUNTER — Other Ambulatory Visit: Payer: Commercial Managed Care - HMO

## 2014-09-21 ENCOUNTER — Other Ambulatory Visit: Payer: Self-pay | Admitting: Internal Medicine

## 2014-09-21 DIAGNOSIS — E119 Type 2 diabetes mellitus without complications: Secondary | ICD-10-CM

## 2014-09-22 ENCOUNTER — Other Ambulatory Visit (INDEPENDENT_AMBULATORY_CARE_PROVIDER_SITE_OTHER): Payer: Commercial Managed Care - HMO

## 2014-09-22 ENCOUNTER — Encounter: Payer: Self-pay | Admitting: Internal Medicine

## 2014-09-22 ENCOUNTER — Ambulatory Visit (INDEPENDENT_AMBULATORY_CARE_PROVIDER_SITE_OTHER): Payer: Commercial Managed Care - HMO | Admitting: Internal Medicine

## 2014-09-22 VITALS — BP 126/74 | HR 63 | Temp 97.4°F | Resp 16 | Ht 63.0 in | Wt 147.0 lb

## 2014-09-22 DIAGNOSIS — E118 Type 2 diabetes mellitus with unspecified complications: Secondary | ICD-10-CM

## 2014-09-22 DIAGNOSIS — E119 Type 2 diabetes mellitus without complications: Secondary | ICD-10-CM

## 2014-09-22 DIAGNOSIS — I10 Essential (primary) hypertension: Secondary | ICD-10-CM

## 2014-09-22 DIAGNOSIS — E785 Hyperlipidemia, unspecified: Secondary | ICD-10-CM

## 2014-09-22 DIAGNOSIS — E1169 Type 2 diabetes mellitus with other specified complication: Secondary | ICD-10-CM

## 2014-09-22 LAB — COMPREHENSIVE METABOLIC PANEL WITH GFR
ALT: 15 U/L (ref 0–35)
AST: 18 U/L (ref 0–37)
Albumin: 4.5 g/dL (ref 3.5–5.2)
Alkaline Phosphatase: 105 U/L (ref 39–117)
BUN: 21 mg/dL (ref 6–23)
CO2: 30 meq/L (ref 19–32)
Calcium: 10 mg/dL (ref 8.4–10.5)
Chloride: 103 meq/L (ref 96–112)
Creatinine, Ser: 0.73 mg/dL (ref 0.40–1.20)
GFR: 83.06 mL/min
Glucose, Bld: 190 mg/dL — ABNORMAL HIGH (ref 70–99)
Potassium: 4.6 meq/L (ref 3.5–5.1)
Sodium: 139 meq/L (ref 135–145)
Total Bilirubin: 0.7 mg/dL (ref 0.2–1.2)
Total Protein: 6.9 g/dL (ref 6.0–8.3)

## 2014-09-22 LAB — LIPID PANEL
Cholesterol: 171 mg/dL (ref 0–200)
HDL: 49.6 mg/dL
NonHDL: 121.4
Total CHOL/HDL Ratio: 3
Triglycerides: 266 mg/dL — ABNORMAL HIGH (ref 0.0–149.0)
VLDL: 53.2 mg/dL — ABNORMAL HIGH (ref 0.0–40.0)

## 2014-09-22 LAB — LDL CHOLESTEROL, DIRECT: Direct LDL: 93 mg/dL

## 2014-09-22 LAB — TSH: TSH: 8.77 u[IU]/mL — ABNORMAL HIGH (ref 0.35–4.50)

## 2014-09-22 LAB — HM DIABETES FOOT EXAM: HM Diabetic Foot Exam: NORMAL

## 2014-09-22 LAB — HEMOGLOBIN A1C: Hgb A1c MFr Bld: 7.9 % — ABNORMAL HIGH (ref 4.6–6.5)

## 2014-09-22 MED ORDER — ALPRAZOLAM 0.25 MG PO TABS: 0.2500 mg | ORAL_TABLET | Freq: Four times a day (QID) | ORAL | Status: DC | PRN

## 2014-09-22 NOTE — Patient Instructions (Addendum)
You are doing well I will e mail you the results of your labs    Diabetes and Exercise Exercising regularly is important. It is not just about losing weight. It has many health benefits, such as:  Improving your overall fitness, flexibility, and endurance.  Increasing your bone density.  Helping with weight control.  Decreasing your body fat.  Increasing your muscle strength.  Reducing stress and tension.  Improving your overall health. People with diabetes who exercise gain additional benefits because exercise:  Reduces appetite.  Improves the body's use of blood sugar (glucose).  Helps lower or control blood glucose.  Decreases blood pressure.  Helps control blood lipids (such as cholesterol and triglycerides).  Improves the body's use of the hormone insulin by:  Increasing the body's insulin sensitivity.  Reducing the body's insulin needs.  Decreases the risk for heart disease because exercising:  Lowers cholesterol and triglycerides levels.  Increases the levels of good cholesterol (such as high-density lipoproteins [HDL]) in the body.  Lowers blood glucose levels. YOUR ACTIVITY PLAN  Choose an activity that you enjoy and set realistic goals. Your health care provider or diabetes educator can help you make an activity plan that works for you. Exercise regularly as directed by your health care provider. This includes:  Performing resistance training twice a week such as push-ups, sit-ups, lifting weights, or using resistance bands.  Performing 150 minutes of cardio exercises each week such as walking, running, or playing sports.  Staying active and spending no more than 90 minutes at one time being inactive. Even short bursts of exercise are good for you. Three 10-minute sessions spread throughout the day are just as beneficial as a single 30-minute session. Some exercise ideas include:  Taking the dog for a walk.  Taking the stairs instead of the  elevator.  Dancing to your favorite song.  Doing an exercise video.  Doing your favorite exercise with a friend. RECOMMENDATIONS FOR EXERCISING WITH TYPE 1 OR TYPE 2 DIABETES   Check your blood glucose before exercising. If blood glucose levels are greater than 240 mg/dL, check for urine ketones. Do not exercise if ketones are present.  Avoid injecting insulin into areas of the body that are going to be exercised. For example, avoid injecting insulin into:  The arms when playing tennis.  The legs when jogging.  Keep a record of:  Food intake before and after you exercise.  Expected peak times of insulin action.  Blood glucose levels before and after you exercise.  The type and amount of exercise you have done.  Review your records with your health care provider. Your health care provider will help you to develop guidelines for adjusting food intake and insulin amounts before and after exercising.  If you take insulin or oral hypoglycemic agents, watch for signs and symptoms of hypoglycemia. They include:  Dizziness.  Shaking.  Sweating.  Chills.  Confusion.  Drink plenty of water while you exercise to prevent dehydration or heat stroke. Body water is lost during exercise and must be replaced.  Talk to your health care provider before starting an exercise program to make sure it is safe for you. Remember, almost any type of activity is better than none. Document Released: 11/10/2003 Document Revised: 01/04/2014 Document Reviewed: 01/27/2013 [facility] Patient Information 2015 [place], [place]. This information is not intended to replace advice given to you by your health care provider. Make sure you discuss any questions you have with your health care provider.

## 2014-09-22 NOTE — Progress Notes (Signed)
Patient ID: [name], female   DOB: 18-Jan-1941, 74 y.o.   MRN: [id number]  Patient Active Problem List   Diagnosis Date Noted  . History of cellulitis of skin with lymphangitis 03/20/2014  . Eczema of hand 05/11/2013  . Hematuria, microscopic 03/26/2013  . Overweight (BMI 25.0-29.9) 03/26/2013  . Screening for cervical cancer 11/19/2011  . Routine general medical examination at a health care facility 05/02/2011  . DM type 2, controlled, with complication 93/81/8299  . HYPOTHYROIDISM 02/06/2007  . Hyperlipidemia associated with type 2 diabetes mellitus 02/06/2007  . Essential hypertension 02/06/2007  . GERD 02/06/2007  . OSTEOPOROSIS 02/06/2007    Subjective:  CC:   Chief Complaint  Patient presents with  . Follow-up  . Diabetes    HPI:   [name] is a 74 y.o. female who presents for  3 month  follow up on DM, hyperlipidemia, hypertension, and anxiety.  She is very anxious today, cites multiple family stressors,  Including the prolonged convalescence of her grown daughter resulting  Both daughter and young granddaughter since October .  Feels unappreciated,  Some resentment about her daughter's lack of participation in any of the home activities and chores.  Very worried that she has lost control of her diabetes due to the holidays and altered eating patterns and habits , Generally feels weel, however, and is taking her medications as directed.  No side effects.  Checks blood sugars rarely,  No hypoglycemic events,  Up to date with annual eye exams.  Not exercising regularly lately but plans to resume regular walking program,.     Past Medical History  Diagnosis Date  . GERD (gastroesophageal reflux disease)   . Hyperlipidemia   . Hypertension   . Thyroid disease   . Osteoporosis   . Diabetes mellitus     Past Surgical History  Procedure Laterality Date  . Vaginal delivery    . Cervical polypectomy    . Breast cyst aspiration    . Humerus fracture surgery     . Wrist fracture surgery         The following portions of the patient's history were reviewed and updated as appropriate: Allergies, current medications, and problem list.    Review of Systems:   Patient denies headache, fevers, malaise, unintentional weight loss, skin rash, eye pain, sinus congestion and sinus pain, sore throat, dysphagia,  hemoptysis , cough, dyspnea, wheezing, chest pain, palpitations, orthopnea, edema, abdominal pain, nausea, melena, diarrhea, constipation, flank pain, dysuria, hematuria, urinary  Frequency, nocturia, numbness, tingling, seizures,  Focal weakness, Loss of consciousness,  Tremor, insomnia, depression, anxiety, and suicidal ideation.     History   Social History  . Marital Status: Widowed    Spouse Name: N/A    Number of Children: 2  . 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 Smoking status: Never Smoker   . Smokeless tobacco: Never Used  . Alcohol Use: No  . Drug Use: No  . Sexual Activity: Not on file   Other Topics Concern  . Not on file   Social History Narrative   Widowed in spring of 2012   Lost brother also   Has living will   Son and daughter hold health care POA   Would accept resuscitation but no prolonged artificial ventilation   Not sure about feeding tube    Objective:  Filed Vitals:   09/22/14 0916  BP: 126/74  Pulse: 63  Temp: 97.4 F (36.3 C)  Resp: 16     General appearance: alert, cooperative and appears stated age Ears: normal TM's and external ear canals both ears Throat: lips, mucosa, and tongue normal; teeth and gums normal Neck: no adenopathy, no carotid bruit, supple, symmetrical, trachea midline and thyroid not enlarged, symmetric, no tenderness/mass/nodules Back: symmetric, no curvature. ROM normal. No CVA tenderness. Lungs: clear to auscultation bilaterally Heart: regular rate and rhythm, S1, S2 normal, no murmur, click, rub  or gallop Abdomen: soft, non-tender; bowel sounds normal; no masses,  no organomegaly Pulses: 2+ and symmetric Skin: Skin color, texture, turgor normal. No rashes or lesions Lymph nodes: Cervical, supraclavicular, and axillary nodes normal. Foot exam:  Nails are well trimmed,  No callouses,  Sensation intact to microfilament   Assessment and Plan:  Essential hypertension Well controlled on current regimen. Renal function stable, no changes today.  Lab Results  Component Value Date   CREATININE 0.73 09/22/2014   Lab Results  Component Value Date   NA 139 09/22/2014   K 4.6 09/22/2014   CL 103 09/22/2014   CO2 30 09/22/2014      DM type 2, controlled, with complication Loss of control noted with current A1c , hemoglobin A1c has risen from < 7.0 to 7.9  . she is up-to-date on eye exams and her foot exam is normal.  Will repeat her urine microalbumin to creatinine ratio at next visit. she is on the appropriate medications.  Will add metformin 500 mg bid and encourage regular exercise.   Lab Results  Component Value Date   HGBA1C 7.9* 09/22/2014   Lab Results  Component Value Date   MICROALBUR 0.9 03/16/2014           Hyperlipidemia associated with type 2 diabetes mellitus Well contrLDL is < 100 on lipitor 40 mg but triglycerides are mildly elevated.  Encouraged to resume low GI diet and regular exercise.   Liver enzymes are normal,  No changes today   Lab Results  Component Value Date   CHOL 171 09/22/2014   HDL 49.60 09/22/2014   LDLCALC 62 03/16/2014   LDLDIRECT 93.0 09/22/2014   TRIG 266.0* 09/22/2014   CHOLHDL 3 09/22/2014   Lab Results  Component Value Date   ALT 15 09/22/2014   AST 18 09/22/2014   ALKPHOS 105 09/22/2014   BILITOT 0.7 09/22/2014                Updated Medication List Outpatient Encounter Prescriptions as of 09/22/2014  Medication Sig  . aspirin 81 MG tablet Take 81 mg by mouth daily.  Marland Kitchen atorvastatin (LIPITOR) 20 MG  tablet Take 1 tablet (20 mg total) by mouth daily.  . calcium-vitamin D (OSCAL WITH D) 500-200 MG-UNIT per tablet Take 1 tablet by mouth daily.    . Cholecalciferol (VITAMIN D3) 400 UNITS CAPS Take 1 capsule by mouth daily.  . GE100 BLOOD GLUCOSE TEST test strip CHECK TWICE A DAY  . glucose blood test strip Check sugars twice daily,ascensia contour micro fill test strips, Dx 250.00  . levothyroxine (SYNTHROID, LEVOTHROID) 75 MCG tablet TAKE ONE TABLET BY MOUTH EVERY DAY  . losartan (COZAAR) 50 MG tablet TAKE ONE TABLET BY MOUTH EVERY DAY  . ALPRAZolam (XANAX) 0.25 MG tablet Take 1 tablet (0.25 mg total) by mouth every 6 (six) hours as needed for anxiety.  . cyanocobalamin 500 MCG tablet Take 500 mcg by mouth daily.  . metFORMIN (GLUCOPHAGE) 500 MG tablet Take 1  tablet (500 mg total) by mouth 2 (two) times daily with a meal.  . zoster vaccine live, PF, (ZOSTAVAX) 73220 UNT/0.65ML injection Inject 19,400 Units into the skin once. (Patient not taking: Reported on 09/22/2014)  . [DISCONTINUED] ALPRAZolam (XANAX) 0.25 MG tablet Take 1 tablet (0.25 mg total) by mouth every 6 (six) hours as needed for anxiety. (Patient not taking: Reported on 09/22/2014)     Orders Placed This Encounter  Procedures  . LDL cholesterol, direct  . HM DIABETES FOOT EXAM    No Follow-up on file.

## 2014-09-22 NOTE — Progress Notes (Signed)
Pre-visit discussion using our clinic review tool. No additional management support is needed unless otherwise documented below in the visit note.  

## 2014-09-25 ENCOUNTER — Encounter: Payer: Self-pay | Admitting: Internal Medicine

## 2014-09-25 MED ORDER — METFORMIN HCL 500 MG PO TABS: 500.0000 mg | ORAL_TABLET | Freq: Two times a day (BID) | ORAL | Status: DC

## 2014-09-25 NOTE — Assessment & Plan Note (Addendum)
Well contrLDL is < 100 on lipitor 40 mg but triglycerides are mildly elevated.  Encouraged to resume low GI diet and regular exercise.   Liver enzymes are normal,  No changes today   Lab Results  Component Value Date   CHOL 171 09/22/2014   HDL 49.60 09/22/2014   LDLCALC 62 03/16/2014   LDLDIRECT 93.0 09/22/2014   TRIG 266.0* 09/22/2014   CHOLHDL 3 09/22/2014   Lab Results  Component Value Date   ALT 15 09/22/2014   AST 18 09/22/2014   ALKPHOS 105 09/22/2014   BILITOT 0.7 09/22/2014

## 2014-09-25 NOTE — Assessment & Plan Note (Signed)
Loss of control noted with current A1c , hemoglobin A1c has risen from < 7.0 to 7.9  . she is up-to-date on eye exams and her foot exam is normal.  Will repeat her urine microalbumin to creatinine ratio at next visit. she is on the appropriate medications.  Will add metformin 500 mg bid and encourage regular exercise.   Lab Results  Component Value Date   HGBA1C 7.9* 09/22/2014   Lab Results  Component Value Date   MICROALBUR 0.9 03/16/2014

## 2014-09-25 NOTE — Assessment & Plan Note (Signed)
Well controlled on current regimen. Renal function stable, no changes today.  Lab Results  Component Value Date   CREATININE 0.73 09/22/2014   Lab Results  Component Value Date   NA 139 09/22/2014   K 4.6 09/22/2014   CL 103 09/22/2014   CO2 30 09/22/2014

## 2014-09-27 ENCOUNTER — Encounter: Payer: Self-pay | Admitting: *Deleted

## 2014-12-22 ENCOUNTER — Ambulatory Visit (INDEPENDENT_AMBULATORY_CARE_PROVIDER_SITE_OTHER): Payer: Commercial Managed Care - HMO | Admitting: Internal Medicine

## 2014-12-22 ENCOUNTER — Encounter: Payer: Self-pay | Admitting: Internal Medicine

## 2014-12-22 VITALS — BP 126/60 | HR 60 | Temp 98.5°F | Resp 14 | Ht 63.0 in | Wt 151.5 lb

## 2014-12-22 DIAGNOSIS — E663 Overweight: Secondary | ICD-10-CM

## 2014-12-22 DIAGNOSIS — E118 Type 2 diabetes mellitus with unspecified complications: Secondary | ICD-10-CM | POA: Diagnosis not present

## 2014-12-22 DIAGNOSIS — E1169 Type 2 diabetes mellitus with other specified complication: Secondary | ICD-10-CM

## 2014-12-22 DIAGNOSIS — E119 Type 2 diabetes mellitus without complications: Secondary | ICD-10-CM | POA: Diagnosis not present

## 2014-12-22 DIAGNOSIS — E785 Hyperlipidemia, unspecified: Secondary | ICD-10-CM

## 2014-12-22 LAB — COMPREHENSIVE METABOLIC PANEL WITH GFR
ALT: 14 U/L (ref 0–35)
AST: 17 U/L (ref 0–37)
Albumin: 4.6 g/dL (ref 3.5–5.2)
Alkaline Phosphatase: 92 U/L (ref 39–117)
BUN: 21 mg/dL (ref 6–23)
CO2: 29 meq/L (ref 19–32)
Calcium: 10 mg/dL (ref 8.4–10.5)
Chloride: 104 meq/L (ref 96–112)
Creatinine, Ser: 0.68 mg/dL (ref 0.40–1.20)
GFR: 90.09 mL/min
Glucose, Bld: 134 mg/dL — ABNORMAL HIGH (ref 70–99)
Potassium: 4.3 meq/L (ref 3.5–5.1)
Sodium: 138 meq/L (ref 135–145)
Total Bilirubin: 0.7 mg/dL (ref 0.2–1.2)
Total Protein: 7.2 g/dL (ref 6.0–8.3)

## 2014-12-22 LAB — MICROALBUMIN / CREATININE URINE RATIO
Creatinine,U: 123.3 mg/dL
MICROALB/CREAT RATIO: 1.1 mg/g (ref 0.0–30.0)
Microalb, Ur: 1.3 mg/dL (ref 0.0–1.9)

## 2014-12-22 LAB — LIPID PANEL
Cholesterol: 144 mg/dL (ref 0–200)
HDL: 46 mg/dL
NonHDL: 98
Total CHOL/HDL Ratio: 3
Triglycerides: 203 mg/dL — ABNORMAL HIGH (ref 0.0–149.0)
VLDL: 40.6 mg/dL — ABNORMAL HIGH (ref 0.0–40.0)

## 2014-12-22 LAB — LDL CHOLESTEROL, DIRECT: Direct LDL: 75 mg/dL

## 2014-12-22 LAB — HEMOGLOBIN A1C: Hgb A1c MFr Bld: 6.7 % — ABNORMAL HIGH (ref 4.6–6.5)

## 2014-12-22 NOTE — Progress Notes (Signed)
Patient ID: [name], female   DOB: 1941/07/28, 74 y.o.   MRN: [id number]  Patient Active Problem List   Diagnosis Date Noted  . History of cellulitis of skin with lymphangitis 03/20/2014  . Eczema of hand 05/11/2013  . Hematuria, microscopic 03/26/2013  . Overweight (BMI 25.0-29.9) 03/26/2013  . Screening for cervical cancer 11/19/2011  . Routine general medical examination at a health care facility 05/02/2011  . DM type 2, controlled, with complication 42/59/5638  . HYPOTHYROIDISM 02/06/2007  . Hyperlipidemia associated with type 2 diabetes mellitus 02/06/2007  . Essential hypertension 02/06/2007  . GERD 02/06/2007  . OSTEOPOROSIS 02/06/2007    Subjective:  CC:   Chief Complaint  Patient presents with  . Follow-up  . Diabetes    HPI:   [name] is a 74 y.o. female who presents for   Follow up on type 2 DM, hyperlipidemia, hypertennsion and obesity.  She has gained 4 more lbs despite following a carbohydrate modified diet.  DIET AND blood sugars reviewed.   FASTING SUGARS HAVE BEEN ELEVATED CONSISTENTLY TO 155-160.  HAS ATKINS BAR IN 5 AM ,  9 AM EATS Vanilla wagers.  Lunch  Is a sandwhich and a banana or a salad,    Lab Results  Component Value Date   HGBA1C 7.9* 09/22/2014   Wt Readings from Last 3 Encounters:  12/22/14 151 lb 8 oz (68.72 kg)  09/22/14 147 lb (66.679 kg)  06/21/14 149 lb 12 oz (67.926 kg)     Past Medical History  Diagnosis Date  . GERD (gastroesophageal reflux disease)   . Hyperlipidemia   . Hypertension   . Thyroid disease   . Osteoporosis   . Diabetes mellitus     Past Surgical History  Procedure Laterality Date  . Vaginal delivery    . Cervical polypectomy    . Breast cyst aspiration    . Humerus fracture surgery    . Wrist fracture surgery         The following portions of the patient's history were reviewed and updated as appropriate: Allergies, current medications, and problem list.    Review of Systems:   Patient denies headache, fevers, malaise, unintentional weight loss, skin rash, eye pain, sinus congestion and sinus pain, sore throat, dysphagia,  hemoptysis , cough, dyspnea, wheezing, chest pain, palpitations, orthopnea, edema, abdominal pain, nausea, melena, diarrhea, constipation, flank pain, dysuria, hematuria, urinary  Frequency, nocturia, numbness, tingling, seizures,  Focal weakness, Loss of consciousness,  Tremor, insomnia, depression, anxiety, and suicidal ideation.     History   Social History  . Marital Status: Widowed    Spouse Name: N/A  . Number of Children: 2  . 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 Smoking status: Never Smoker   . Smokeless tobacco: Never Used  . Alcohol Use: No  . Drug Use: No  . Sexual Activity: Not on file   Other Topics Concern  . Not on file   Social History Narrative   Widowed in spring of 2012   Lost brother also   Has living will   Son and daughter hold health care POA   Would accept resuscitation but no prolonged artificial ventilation   Not sure about feeding tube    Objective:  Filed Vitals:   12/22/14 0936  BP: 126/60  Pulse: 60  Temp: 98.5 F (36.9 C)  Resp: 14     General appearance:  alert, cooperative and appears stated age Ears: normal TM's and external ear canals both ears Throat: lips, mucosa, and tongue normal; teeth and gums normal Neck: no adenopathy, no carotid bruit, supple, symmetrical, trachea midline and thyroid not enlarged, symmetric, no tenderness/mass/nodules Back: symmetric, no curvature. ROM normal. No CVA tenderness. Lungs: clear to auscultation bilaterally Heart: regular rate and rhythm, S1, S2 normal, no murmur, click, rub or gallop Abdomen: soft, non-tender; bowel sounds normal; no masses,  no organomegaly Pulses: 2+ and symmetric Skin: Skin color, texture, turgor normal. No rashes or lesions Lymph nodes: Cervical,  supraclavicular, and axillary nodes normal.  Assessment and Plan:  DM type 2, controlled, with complication Currently well-controlled on current medications .  hemoglobin A1c is at goal of less than 7.0 . Patient is reminded to schedule an annual eye exam and foot exam is normal today. Patient has no microalbuminuria. Patient is tolerating statin therapy for CAD risk reduction and on ACE/ARB for renal protection and hypertension   Lab Results  Component Value Date   HGBA1C 6.7* 12/22/2014   Lab Results  Component Value Date   MICROALBUR 1.3 12/22/2014     Hyperlipidemia associated with type 2 diabetes mellitus Well contrLDL is < 100 on lipitor 40 mg but triglycerides continue to be  mildly elevated.  Encouraged to resume low GI diet and regular exercise.   Liver enzymes are normal,  No changes today   Lab Results  Component Value Date   CHOL 144 12/22/2014   HDL 46.00 12/22/2014   LDLCALC 62 03/16/2014   LDLDIRECT 75.0 12/22/2014   TRIG 203.0* 12/22/2014   CHOLHDL 3 12/22/2014   Lab Results  Component Value Date   ALT 14 12/22/2014   AST 17 12/22/2014   ALKPHOS 92 12/22/2014   BILITOT 0.7 12/22/2014                 Overweight (BMI 25.0-29.9) I have addressed  BMI and recommended a low glycemic index diet utilizing smaller more frequent meals to increase metabolism.  I have also recommended that patient start exercising with a goal of 30 minutes of aerobic exercise a minimum of 5 days per week. Screening for lipid disorders, thyroid and diabetes to be done today.      Updated Medication List Outpatient Encounter Prescriptions as of 12/22/2014  Medication Sig  . ALPRAZolam (XANAX) 0.25 MG tablet Take 1 tablet (0.25 mg total) by mouth every 6 (six) hours as needed for anxiety. (Patient not taking: Reported on 12/22/2014)  . aspirin 81 MG tablet Take 81 mg by mouth daily.  Marland Kitchen atorvastatin (LIPITOR) 20 MG tablet Take 1 tablet (20 mg total) by mouth daily.  .  calcium-vitamin D (OSCAL WITH D) 500-200 MG-UNIT per tablet Take 1 tablet by mouth daily.    . GE100 BLOOD GLUCOSE TEST test strip CHECK TWICE A DAY  . glucose blood test strip Check sugars twice daily,ascensia contour micro fill test strips, Dx 250.00  . levothyroxine (SYNTHROID, LEVOTHROID) 75 MCG tablet TAKE ONE TABLET BY MOUTH EVERY DAY  . losartan (COZAAR) 50 MG tablet TAKE ONE TABLET BY MOUTH EVERY DAY  . metFORMIN (GLUCOPHAGE) 500 MG tablet Take 1 tablet (500 mg total) by mouth 2 (two) times daily with a meal.  . zoster vaccine live, PF, (ZOSTAVAX) 96222 UNT/0.65ML injection Inject 19,400 Units into the skin once.  . [DISCONTINUED] Cholecalciferol (VITAMIN D3) 400 UNITS CAPS Take 1 capsule by mouth daily.  . [DISCONTINUED] cyanocobalamin 500 MCG tablet Take 500 mcg by  mouth daily.   A total of 25 minutes of face to face time was spent with patient more than half of which was spent in counselling about the above mentioned conditions  and coordination of care   Orders Placed This Encounter  Procedures  . Comprehensive metabolic panel  . Hemoglobin A1c  . Lipid panel  . Microalbumin / creatinine urine ratio  . LDL cholesterol, direct  . Ambulatory referral to Ophthalmology    Return in about 3 months (around 03/23/2015) for follow up diabetes.

## 2014-12-22 NOTE — Progress Notes (Signed)
Pre-visit discussion using our clinic review tool. No additional management support is needed unless otherwise documented below in the visit note.  

## 2014-12-22 NOTE — Patient Instructions (Signed)
This is Dr. Lupita Dawn version of a  "Low GI"  Weight loss Diet.  It is appropriate for all patients with normal renal function , gluten tolerance, and advised for patients who have prediabetes or diabetes:   All of the foods can be found at grocery stores and in bulk at [employer].  The Atkins protein bars and shakes are available in more varieties at Target, [employer]Mart and [place].     7 AM Breakfast:  Choose from the following:  < 5 carbs  Weekdays: Low carbohydrate Protein  Shakes (EAS AdvantEdge "Carb Control" shakes, Atkins,  Muscle Milk or Premier Protein shakes)     Weekends:  a scrambled egg/bacon/cheese burrito made with Mission's "carb  balance" whole wheat tortilla  (about 10 net carbs )  Eggs,  bacon /sausage , Joseph's pita /lavash bread or  (5 carbs)  A slice of fritatta ( egg based baked dish, no  crust:  google it) (< 10 carbs)   Avoid cereal and bananas, oatmeal and cream of wheat and grits. They are loaded with carbohydrates!   10 AM: high protein snack  (< 5 carbs)   Protein bar by Atkins  Or KIND  (the snack size, < 200 cal, usually < 6 carbs    A stick of cheese:  Around 1 carb,  100 cal      Other so called "protein bars" tend to be loaded with carbohydrates.  Remember, in food advertising, the word "energy" is synonymous for " carbohydrate."  Lunch:   A Sandwich using the bread choices listed, Can use any  Eggs,  lunchmeat, grilled meat or canned tuna).  Can add avocado, regular mayo/mustard  and cheese.  A Salad using blue cheese, ranch,  Goddess dressing  or vinagrette,  No croutons or "confetti" and no "candied nuts" but regular nuts OK.   2 HARD BOILED EGG WHITES AND A CUP OF one of these greek yogurts:    dannon lt n fit greek yogurt         chobani 100 greek yogurt,    Oikos triple zero greek yogurt       No pretzels or chips.  Pickles and miniature sweet peppers are a good low  carb alternative that provide a "crunch"  The bread is the only source of  carbohydrate in a sandwich and  can be decreased by trying some of these alternatives to traditional loaf bread:   Joseph's pita bread and Lavash (flat) bread :  50 cal and 4 net carbs  available at BJs and WalMart.  Taste better when toasted, use as pita chips  T[place] makes a variety of  flatbreads and  A PITA POCKET.    LOOK FOR  THE ONES THAT ARE 17 NET CARBS OR LESS    Mission makes 2 sizes of  Low carb whole wheat tortillas  (The large one is  210 cal and 6 net carbs)   Avoid "Low fat dressings, as well as Barry Brunner and [place] dressings    3 PM/ Mid day  Snack:  Consider  1 ounce of  almonds, walnuts, pistachios, pecans, peanuts,  Macadamia nuts or a nut medley that does not contain raisins or cranberries.  No "granola"; the dried cranberries and raisins are loaded with carbohydrates. Mixed nuts as long as there are no raisins,  cranberries or dried fruit.    Try the prosciutto/mozzarella cheese sticks by [employer]  In deli /backery section   High protein  To avoid overindulging in snacks: Try drinking a glass of unsweeted almond/coconut milk  Or a cup of coffee with your Atkins chocolate bar to keep you from having 3!!!   Pork rinds!  Yes Pork Rinds are low carb potato chip substitute!   Toasted Joseph's flatbread with hummous dip (chickpeas)       6 PM  Dinner:     Meat/fowl/fish with a green salad, and either broccoli, cauliflower, green beans, spinach, brussel sprouts, bok choy or  Lima beans. Fried in canola oil /olive oil BUT DO NOT BREAD THE PROTEIN!!      There is a low carb pasta by Dreamfield's that is acceptable and tastes great: only 5 digestible carbs/serving.( All grocery stores but BJs carry it )  Prepared Meals:  Try Hurley Cisco Angelo's chicken piccata or chicken or eggplant parm over low carb pasta.(Lowes and BJs)   Marjory Lies Sanchez's "Carnitas" (pulled pork, no sauce,  0 carbs) or his beef pot roast to make a dinner burrito (at [employer])  Barbecue with cole slaw is low  carb BUT NO BUN!  SAME WITH HAMBURGERS     Whole wheat pasta is still full of digestible carbs and  Not as low in glycemic index as Dreamfield's.   Brown rice is still rice,  So skip the rice and noodles if you eat [place] or [place] (or at least limit to 1/2 cup)  9 PM snack :   Breyer's "low carb" fudgsicle or  ice cream bar (Carb Smart line), or  Weight  Watcher's ice cream bar , or another "no sugar added" ice cream;  a serving of fresh berries/cherries with whipped cream   Cheese or greek yogurt   8 ounces of [place] unsweetened almond/cococunut milk  Cheese and crackers (using WASA crackers,  They are low carb) or peanut butter on low carb crackers or pita bread     Almond Milk Light in original flavor by Silk.  5 carbs   40 calories   Premier Protein Chocolate protein  Shakes  Delicious   Avoid bananas, pineapple, grapes  and watermelon on a regular basis because they are high in sugar.  THINK OF THEM AS DESSERT and do not have daily   Remember that snack Substitutions should be less than 10 NET carbs per serving and meals should be < 20 net carbs. Remember that carbohydrates from fiber do not affect blood sugar, so you can  subtract fiber grams to get the "net carbs " of any particular food item.

## 2014-12-23 ENCOUNTER — Telehealth: Payer: Self-pay | Admitting: Internal Medicine

## 2014-12-24 ENCOUNTER — Encounter: Payer: Self-pay | Admitting: *Deleted

## 2014-12-25 ENCOUNTER — Encounter: Payer: Self-pay | Admitting: Internal Medicine

## 2014-12-25 NOTE — Assessment & Plan Note (Signed)
Currently well-controlled on current medications .  hemoglobin A1c is at goal of less than 7.0 . Patient is reminded to schedule an annual eye exam and foot exam is normal today. Patient has no microalbuminuria. Patient is tolerating statin therapy for CAD risk reduction and on ACE/ARB for renal protection and hypertension   Lab Results  Component Value Date   HGBA1C 6.7* 12/22/2014   Lab Results  Component Value Date   MICROALBUR 1.3 12/22/2014

## 2014-12-25 NOTE — Assessment & Plan Note (Signed)
Well contrLDL is < 100 on lipitor 40 mg but triglycerides continue to be  mildly elevated.  Encouraged to resume low GI diet and regular exercise.   Liver enzymes are normal,  No changes today   Lab Results  Component Value Date   CHOL 144 12/22/2014   HDL 46.00 12/22/2014   LDLCALC 62 03/16/2014   LDLDIRECT 75.0 12/22/2014   TRIG 203.0* 12/22/2014   CHOLHDL 3 12/22/2014   Lab Results  Component Value Date   ALT 14 12/22/2014   AST 17 12/22/2014   ALKPHOS 92 12/22/2014   BILITOT 0.7 12/22/2014

## 2014-12-25 NOTE — Assessment & Plan Note (Signed)
I have addressed  BMI and recommended a low glycemic index diet utilizing smaller more frequent meals to increase metabolism.  I have also recommended that patient start exercising with a goal of 30 minutes of aerobic exercise a minimum of 5 days per week. Screening for lipid disorders, thyroid and diabetes to be done today.   

## 2015-01-12 ENCOUNTER — Other Ambulatory Visit: Payer: Self-pay | Admitting: Internal Medicine

## 2015-02-01 ENCOUNTER — Encounter: Payer: Self-pay | Admitting: *Deleted

## 2015-02-01 LAB — HM DIABETES EYE EXAM

## 2015-02-23 ENCOUNTER — Encounter: Payer: Self-pay | Admitting: *Deleted

## 2015-02-25 ENCOUNTER — Other Ambulatory Visit: Payer: Self-pay | Admitting: Internal Medicine

## 2015-02-25 ENCOUNTER — Telehealth: Payer: Self-pay | Admitting: *Deleted

## 2015-02-25 DIAGNOSIS — E118 Type 2 diabetes mellitus with unspecified complications: Secondary | ICD-10-CM

## 2015-02-25 MED ORDER — GLIPIZIDE 5 MG PO TABS: 2.5000 mg | ORAL_TABLET | Freq: Two times a day (BID) | ORAL | Status: DC

## 2015-02-25 NOTE — Telephone Encounter (Signed)
Glipizide 5 mg tablet:   1/2 tablet twice daily before breakfast and dinner. . [facility]

## 2015-02-25 NOTE — Telephone Encounter (Signed)
If her sugars are < 130 fasting and < 160 post prandial, without metformin, she does not need to start any thing else at this time

## 2015-02-25 NOTE — Telephone Encounter (Signed)
Spoke with pt, she states her sugars have been running between 130-140 fasting and in the 160s in the evenings.  Please advise

## 2015-02-25 NOTE — Telephone Encounter (Signed)
Spoke with pt, advised Rx changed and new directions.  Pt verbalized understanding

## 2015-02-25 NOTE — Telephone Encounter (Signed)
Pt came in the clinic, states Metformin was causing explosive diarrhea.  States she stopped taking it last Saturday and diarrhea resolved.  She is having cataract surgery on Wed, and needs to be taking DM meds.  Pt requests whether she is to take one tablet daily instead of two tablets.  Pt states her blood sugar has been running pretty normal sine she stopped taking the Metformin.  Please advise

## 2015-03-01 NOTE — Discharge Instructions (Signed)

## 2015-03-02 ENCOUNTER — Ambulatory Visit: Payer: Medicare HMO | Admitting: Anesthesiology

## 2015-03-02 ENCOUNTER — Ambulatory Visit
Admission: RE | Admit: 2015-03-02 | Discharge: 2015-03-02 | Disposition: A | Discharge status: Home / Self Care | Payer: Medicare HMO | Source: Ambulatory Visit | Attending: Ophthalmology | Admitting: Ophthalmology

## 2015-03-02 ENCOUNTER — Encounter
Admission: RE | Disposition: A | Discharge status: Home / Self Care | Payer: Self-pay | Source: Ambulatory Visit | Attending: Ophthalmology

## 2015-03-02 DIAGNOSIS — E079 Disorder of thyroid, unspecified: Secondary | ICD-10-CM | POA: Diagnosis not present

## 2015-03-02 DIAGNOSIS — H2511 Age-related nuclear cataract, right eye: Secondary | ICD-10-CM | POA: Insufficient documentation

## 2015-03-02 DIAGNOSIS — E780 Pure hypercholesterolemia: Secondary | ICD-10-CM | POA: Insufficient documentation

## 2015-03-02 DIAGNOSIS — K219 Gastro-esophageal reflux disease without esophagitis: Secondary | ICD-10-CM | POA: Insufficient documentation

## 2015-03-02 DIAGNOSIS — Z87891 Personal history of nicotine dependence: Secondary | ICD-10-CM | POA: Insufficient documentation

## 2015-03-02 DIAGNOSIS — E119 Type 2 diabetes mellitus without complications: Secondary | ICD-10-CM | POA: Diagnosis not present

## 2015-03-02 DIAGNOSIS — I10 Essential (primary) hypertension: Secondary | ICD-10-CM | POA: Diagnosis not present

## 2015-03-02 HISTORY — PX: CATARACT EXTRACTION W/PHACO: SHX586

## 2015-03-02 LAB — GLUCOSE, CAPILLARY
Glucose-Capillary: 148 mg/dL — ABNORMAL HIGH (ref 65–99)
Glucose-Capillary: 145 mg/dL — ABNORMAL HIGH (ref 65–99)

## 2015-03-02 SURGERY — CATARACT EXTRACTION PHACO AND INTRAOCULAR LENS PLACEMENT (IOC)
Anesthesia: Monitor Anesthesia Care | Site: Eye | Laterality: Right | Wound class: Clean

## 2015-03-02 MED ORDER — TETRACAINE HCL 0.5 % OP SOLN
1.0000 [drp] | Freq: Once | OPHTHALMIC | Status: AC
  Administered 2015-03-02: 1 [drp] via OPHTHALMIC

## 2015-03-02 MED ORDER — TIMOLOL MALEATE 0.5 % OP SOLN
OPHTHALMIC | Status: DC | PRN
  Administered 2015-03-02: 1 [drp] via OPHTHALMIC

## 2015-03-02 MED ORDER — FENTANYL CITRATE (PF) 100 MCG/2ML IJ SOLN
INTRAMUSCULAR | Status: DC | PRN
  Administered 2015-03-02: 100 ug via INTRAVENOUS

## 2015-03-02 MED ORDER — BRIMONIDINE TARTRATE 0.2 % OP SOLN
OPHTHALMIC | Status: DC | PRN
  Administered 2015-03-02: 1 [drp] via OPHTHALMIC

## 2015-03-02 MED ORDER — NA HYALUR & NA CHOND-NA HYALUR 0.4-0.35 ML IO KIT
PACK | INTRAOCULAR | Status: DC | PRN
  Administered 2015-03-02: 1 mL via INTRAOCULAR

## 2015-03-02 MED ORDER — CEFUROXIME OPHTHALMIC INJECTION 1 MG/0.1 ML
INJECTION | OPHTHALMIC | Status: DC | PRN
  Administered 2015-03-02: .3 mL via INTRACAMERAL

## 2015-03-02 MED ORDER — EPINEPHRINE HCL 1 MG/ML IJ SOLN
INTRAOCULAR | Status: DC | PRN
  Administered 2015-03-02: 62 mL via OPHTHALMIC

## 2015-03-02 MED ORDER — ARMC OPHTHALMIC DILATING GEL
1.0000 "application " | OPHTHALMIC | Status: DC | PRN
  Administered 2015-03-02 (×2): 1 "application " via OPHTHALMIC

## 2015-03-02 MED ORDER — MIDAZOLAM HCL 2 MG/2ML IJ SOLN
INTRAMUSCULAR | Status: DC | PRN
  Administered 2015-03-02: 2 mg via INTRAVENOUS

## 2015-03-02 MED ORDER — POVIDONE-IODINE 5 % OP SOLN: 1.0000 "application " | Freq: Once | OPHTHALMIC | Status: DC

## 2015-03-02 SURGICAL SUPPLY — 25 items
CANNULA ANT/CHMB 27GA (MISCELLANEOUS) ×2 IMPLANT
GLOVE SURG LX 7.5 STRW (GLOVE) ×1
GLOVE SURG LX STRL 7.5 STRW (GLOVE) ×1
GLOVE SURG TRIUMPH 8.0 PF LTX (GLOVE) ×2
GOWN STRL REUS W/ TWL LRG LVL3 (GOWN DISPOSABLE) ×2
GOWN STRL REUS W/TWL LRG LVL3 (GOWN DISPOSABLE) ×4
LENS IOL ACRYSOF IQ TORIC 17.0 (Intraocular Lens) ×2 IMPLANT
LENS IOL IQ TORIC 7 17.0 (Intraocular Lens) ×1 IMPLANT
MARKER SKIN SURG W/RULER VIO (MISCELLANEOUS) ×2
NDL RETROBULBAR .5 NSTRL (NEEDLE)
NEEDLE FILTER BLUNT 18X 1/2SAF (NEEDLE) ×1
NEEDLE FILTER BLUNT 18X1 1/2 (NEEDLE) ×1
PACK CATARACT BRASINGTON (MISCELLANEOUS) ×2
PACK EYE AFTER SURG (MISCELLANEOUS) ×2
PACK OPTHALMIC (MISCELLANEOUS) ×2
RING MALYGIN 7.0 (MISCELLANEOUS)
SUT ETHILON 10-0 CS-B-6CS-B-6 (SUTURE)
SUT VICRYL  9 0 (SUTURE)
SUT VICRYL 9 0 (SUTURE)
SYR 3ML LL SCALE MARK (SYRINGE) ×2
SYR 5ML LL (SYRINGE)
SYR TB 1ML LUER SLIP (SYRINGE) ×2
WATER STERILE IRR 250ML POUR (IV SOLUTION) ×2
WATER STERILE IRR 500ML POUR (IV SOLUTION)
WIPE NON LINTING 3.25X3.25 (MISCELLANEOUS) ×2

## 2015-03-02 NOTE — Transfer of Care (Signed)
Immediate Anesthesia Transfer of Care Note  Patient: [name]  Procedure(s) Performed: Procedure(s) with comments: CATARACT EXTRACTION PHACO AND INTRAOCULAR LENS PLACEMENT (IOC) TORIC LENS (Right) - TORIC  Patient Location: PACU  Anesthesia Type: MAC  Level of Consciousness: awake, alert  and patient cooperative  Airway and Oxygen Therapy: Patient Spontanous Breathing and Patient connected to supplemental oxygen  Post-op Assessment: Post-op Vital signs reviewed, Patient's Cardiovascular Status Stable, Respiratory Function Stable, Patent Airway and No signs of Nausea or vomiting  Post-op Vital Signs: Reviewed and stable  Complications: No apparent anesthesia complications

## 2015-03-02 NOTE — Anesthesia Procedure Notes (Signed)
Procedure Name: MAC Performed by: Eloina Ergle Pre-anesthesia Checklist: Patient identified, Emergency Drugs available, Suction available, Timeout performed and Patient being monitored Patient Re-evaluated:Patient Re-evaluated prior to inductionOxygen Delivery Method: Nasal cannula Placement Confirmation: positive ETCO2     

## 2015-03-02 NOTE — H&P (Signed)
  The History and Physical notes were scanned in.  The patient remains stable and unchanged from the H&P.   Previous H&P reviewed, patient examined, and there are no changes.  [name] 03/02/2015 9:31 AM

## 2015-03-02 NOTE — Anesthesia Postprocedure Evaluation (Signed)
  Anesthesia Post-op Note  Patient: [name]  Procedure(s) Performed: Procedure(s) with comments: CATARACT EXTRACTION PHACO AND INTRAOCULAR LENS PLACEMENT (IOC) TORIC LENS (Right) - TORIC  Anesthesia type:MAC  Patient location: PACU  Post pain: Pain level controlled  Post assessment: Post-op Vital signs reviewed, Patient's Cardiovascular Status Stable, Respiratory Function Stable, Patent Airway and No signs of Nausea or vomiting  Post vital signs: Reviewed and stable  Last Vitals:  Filed Vitals:   03/02/15 0917  BP: 154/65  Pulse: 96  Temp: 36.6 C  Resp: 16    Level of consciousness: awake, alert  and patient cooperative  Complications: No apparent anesthesia complications

## 2015-03-02 NOTE — Op Note (Signed)
LOCATION:  [place]   PREOPERATIVE DIAGNOSIS:  Nuclear sclerotic cataract of the right eye.  H25.11   POSTOPERATIVE DIAGNOSIS:  Nuclear sclerotic cataract of the right eye.   PROCEDURE:  Phacoemulsification with Toric posterior chamber intraocular lens placement of the right eye.   LENS:   Implant Name Type Inv. Item Serial No. Manufacturer Lot No. LRB No. Used  intraocular lens     [id number] ALCON   Right 1     [id number] 17.0 D Toric intraocular lens with 4.5 diopters of cylindrical power with axis orientation at 87 degrees.   ULTRASOUND TIME: 15.5 % of 1 minutes, 27 seconds.  CDE 13.6   SURGEON:  Wyonia Hough, MD   ANESTHESIA:  Topical with tetracaine drops and 2% Xylocaine jelly.   COMPLICATIONS:  None.   DESCRIPTION OF PROCEDURE:  The patient was identified in the holding room and transported to the operating suite and placed in the supine position under the operating microscope.  The right eye was identified as the operative eye, and it was prepped and draped in the usual sterile ophthalmic fashion.    A clear-corneal paracentesis incision was made at the 12:00 position.  The anterior chamber was filled with Viscoat.  A 2.4 millimeter near clear corneal incision was then made at the 9:00 position.  A cystotome and capsulorrhexis forceps were then used to make a curvilinear capsulorrhexis.  Hydrodissection and hydrodelineation were then performed using balanced salt solution.   Phacoemulsification was then used in stop and chop fashion to remove the lens, nucleus and epinucleus.  The remaining cortex was aspirated using the irrigation and aspiration handpiece.  Provisc viscoelastic was then placed into the capsular bag to distend it for lens placement.  The Verion digital marker was used to align the implant at the intended axis.   A Toric lens was then injected into the capsular bag.  It was rotated clockwise until the axis marks on the lens were approximately  15 degrees in the counterclockwise direction to the intended alignment.  The viscoelastic was aspirated from the eye using the irrigation aspiration handpiece.  Then, a Koch spatula through the sideport incision was used to rotate the lens in a clockwise direction until the axis markings of the intraocular lens were lined up with the Verion alignment.  Balanced salt solution was then used to hydrate the wounds. Cefuroxime 0.1 ml of a 10mg /ml solution was injected into the anterior chamber for a dose of 1 mg of intracameral antibiotic at the completion of the case.    The eye was noted to have a physiologic pressure and there was no wound leak noted.   Timolol and Brimonidine drops were applied to the eye.  The patient was taken to the recovery room in stable condition having had no complications of anesthesia or surgery.  Gael Londo 03/02/2015, 10:28 AM

## 2015-03-02 NOTE — Anesthesia Preprocedure Evaluation (Signed)
Anesthesia Evaluation    Airway Mallampati: II  TM Distance: >3 FB Neck ROM: Full    Dental no notable dental hx.    Pulmonary  breath sounds clear to auscultation  Pulmonary exam normal       Cardiovascular hypertension, Normal cardiovascular examRhythm:Regular Rate:Normal     Neuro/Psych    GI/Hepatic GERD-  ,  Endo/Other  diabetes, Type 2Hypothyroidism   Renal/GU      Musculoskeletal   Abdominal   Peds  Hematology   Anesthesia Other Findings   Reproductive/Obstetrics                             Anesthesia Physical Anesthesia Plan  ASA: II  Anesthesia Plan: MAC   Post-op Pain Management:    Induction: Intravenous  Airway Management Planned:   Additional Equipment:   Intra-op Plan:   Post-operative Plan: Extubation in OR  Informed Consent: I have reviewed the patients History and Physical, chart, labs and discussed the procedure including the risks, benefits and alternatives for the proposed anesthesia with the patient or authorized representative who has indicated his/her understanding and acceptance.   Dental advisory given  Plan Discussed with: CRNA  Anesthesia Plan Comments:         Anesthesia Quick Evaluation

## 2015-03-03 ENCOUNTER — Encounter: Payer: Self-pay | Admitting: Ophthalmology

## 2015-03-21 ENCOUNTER — Encounter: Payer: Self-pay | Admitting: *Deleted

## 2015-03-23 ENCOUNTER — Encounter: Payer: Self-pay | Admitting: Internal Medicine

## 2015-03-23 ENCOUNTER — Ambulatory Visit (INDEPENDENT_AMBULATORY_CARE_PROVIDER_SITE_OTHER): Payer: Commercial Managed Care - HMO | Admitting: Internal Medicine

## 2015-03-23 VITALS — BP 138/70 | HR 46 | Temp 97.7°F | Resp 14 | Ht 63.0 in | Wt 148.5 lb

## 2015-03-23 DIAGNOSIS — E118 Type 2 diabetes mellitus with unspecified complications: Secondary | ICD-10-CM

## 2015-03-23 DIAGNOSIS — E663 Overweight: Secondary | ICD-10-CM

## 2015-03-23 DIAGNOSIS — E034 Atrophy of thyroid (acquired): Secondary | ICD-10-CM

## 2015-03-23 DIAGNOSIS — E785 Hyperlipidemia, unspecified: Secondary | ICD-10-CM | POA: Diagnosis not present

## 2015-03-23 DIAGNOSIS — I10 Essential (primary) hypertension: Secondary | ICD-10-CM

## 2015-03-23 DIAGNOSIS — E038 Other specified hypothyroidism: Secondary | ICD-10-CM

## 2015-03-23 DIAGNOSIS — E1169 Type 2 diabetes mellitus with other specified complication: Secondary | ICD-10-CM | POA: Diagnosis not present

## 2015-03-23 LAB — COMPREHENSIVE METABOLIC PANEL WITH GFR
ALT: 10 U/L (ref 0–35)
AST: 13 U/L (ref 0–37)
Albumin: 4.3 g/dL (ref 3.5–5.2)
Alkaline Phosphatase: 97 U/L (ref 39–117)
BUN: 21 mg/dL (ref 6–23)
CO2: 30 meq/L (ref 19–32)
Calcium: 9.8 mg/dL (ref 8.4–10.5)
Chloride: 105 meq/L (ref 96–112)
Creatinine, Ser: 0.76 mg/dL (ref 0.40–1.20)
GFR: 79.18 mL/min
Glucose, Bld: 151 mg/dL — ABNORMAL HIGH (ref 70–99)
Potassium: 4.9 meq/L (ref 3.5–5.1)
Sodium: 140 meq/L (ref 135–145)
Total Bilirubin: 0.6 mg/dL (ref 0.2–1.2)
Total Protein: 6.7 g/dL (ref 6.0–8.3)

## 2015-03-23 LAB — LIPID PANEL
Cholesterol: 142 mg/dL (ref 0–200)
HDL: 45.7 mg/dL
NonHDL: 96.3
Total CHOL/HDL Ratio: 3
Triglycerides: 209 mg/dL — ABNORMAL HIGH (ref 0.0–149.0)
VLDL: 41.8 mg/dL — ABNORMAL HIGH (ref 0.0–40.0)

## 2015-03-23 LAB — LDL CHOLESTEROL, DIRECT: Direct LDL: 76 mg/dL

## 2015-03-23 LAB — HEMOGLOBIN A1C: Hgb A1c MFr Bld: 6.5 % (ref 4.6–6.5)

## 2015-03-23 NOTE — Progress Notes (Signed)
Pre-visit discussion using our clinic review tool. No additional management support is needed unless otherwise documented below in the visit note.  

## 2015-03-23 NOTE — Progress Notes (Signed)
Subjective:  Patient ID: [name], female    DOB: [birth year]  Age: 74 y.o. MRN: [id number]  CC: The primary encounter diagnosis was DM type 2, controlled, with complication. Diagnoses of Essential hypertension, Hypothyroidism due to acquired atrophy of thyroid, Hyperlipidemia associated with type 2 diabetes mellitus, and Overweight (BMI 25.0-29.9) were also pertinent to this visit.  HPI [name] presents for follow up on DM Type 2 .  Has been taking glipizide instead of metformin  Due to recurrent diarrhea. Blood sugars have been under 150 fasting and under 160 post prandially  June 29th rhad ight cataract removed,  Next wednesday  Having a Toric lens implant and  left cataract removed by [place] exam done  [place] bilaterallly,  Otherwise normal  Not exercising except for periodic yard work .    Outpatient Prescriptions Prior to Visit  Medication Sig Dispense Refill  . aspirin 81 MG tablet Take 81 mg by mouth daily.    Marland Kitchen atorvastatin (LIPITOR) 20 MG tablet TAKE ONE TABLET BY MOUTH EVERY DAY 90 tablet 1  . calcium-vitamin D (OSCAL WITH D) 500-200 MG-UNIT per tablet Take 1 tablet by mouth daily.      . GE100 BLOOD GLUCOSE TEST test strip CHECK TWICE A DAY 100 each 1  . glipiZIDE (GLUCOTROL) 5 MG tablet Take 0.5 tablets (2.5 mg total) by mouth 2 (two) times daily before a meal. 30 tablet 3  . glucose blood test strip Check sugars twice daily,[employer]ensia contour micro fill test strips, Dx 250.00 100 each 5  . losartan (COZAAR) 50 MG tablet TAKE ONE TABLET BY MOUTH EVERY DAY 90 tablet 1  . levothyroxine (SYNTHROID, LEVOTHROID) 75 MCG tablet TAKE ONE TABLET BY MOUTH EVERY DAY 90 tablet 1  . zoster vaccine live, PF, (ZOSTAVAX) 62952 UNT/0.65ML injection Inject 19,400 Units into the skin once. 1 each 0   No facility-administered medications prior to visit.    Review of Systems;  Patient denies headache, fevers, malaise, unintentional weight loss, skin rash, eye pain,  sinus congestion and sinus pain, sore throat, dysphagia,  hemoptysis , cough, dyspnea, wheezing, chest pain, palpitations, orthopnea, edema, abdominal pain, nausea, melena, diarrhea, constipation, flank pain, dysuria, hematuria, urinary  Frequency, nocturia, numbness, tingling, seizures,  Focal weakness, Loss of consciousness,  Tremor, insomnia, depression, anxiety, and suicidal ideation.      Objective:  BP 138/70 mmHg  Pulse 46  Temp(Src) 97.7 F (36.5 C) (Oral)  Resp 14  Ht 5\' 3"  (1.6 m)  Wt 148 lb 8 oz (67.359 kg)  BMI 26.31 kg/m2  SpO2 97%  BP Readings from Last 3 Encounters:  03/23/15 138/70  03/02/15 154/65  12/22/14 126/60    Wt Readings from Last 3 Encounters:  03/23/15 148 lb 8 oz (67.359 kg)  03/21/15 149 lb (67.586 kg)  03/02/15 149 lb (67.586 kg)    General appearance: alert, cooperative and appears stated age Ears: normal TM's and external ear canals both ears Throat: lips, mucosa, and tongue normal; teeth and gums normal Neck: no adenopathy, no carotid bruit, supple, symmetrical, trachea midline and thyroid not enlarged, symmetric, no tenderness/mass/nodules Back: symmetric, no curvature. ROM normal. No CVA tenderness. Lungs: clear to auscultation bilaterally Heart: regular rate and rhythm, S1, S2 normal, no murmur, click, rub or gallop Abdomen: soft, non-tender; bowel sounds normal; no masses,  no organomegaly Pulses: 2+ and symmetric Skin: Skin color, texture, turgor normal. No rashes or lesions Lymph nodes: Cervical, supraclavicular, and axillary nodes normal.  Lab  Results  Component Value Date   HGBA1C 6.5 03/23/2015   HGBA1C 6.7* 12/22/2014   HGBA1C 7.9* 09/22/2014    Lab Results  Component Value Date   CREATININE 0.76 03/23/2015   CREATININE 0.68 12/22/2014   CREATININE 0.73 09/22/2014    Lab Results  Component Value Date   WBC 7.1 03/18/2014   HGB 13.3 03/18/2014   HCT 40.0 03/18/2014   PLT 183.0 03/18/2014   GLUCOSE 151* 03/23/2015    CHOL 142 03/23/2015   TRIG 209.0* 03/23/2015   HDL 45.70 03/23/2015   LDLDIRECT 76.0 03/23/2015   LDLCALC 62 03/16/2014   ALT 10 03/23/2015   AST 13 03/23/2015   NA 140 03/23/2015   K 4.9 03/23/2015   CL 105 03/23/2015   CREATININE 0.76 03/23/2015   BUN 21 03/23/2015   CO2 30 03/23/2015   TSH 8.77* 09/22/2014   HGBA1C 6.5 03/23/2015   MICROALBUR 1.3 12/22/2014    No results found.  Assessment & Plan:   Problem List Items Addressed This Visit      Unprioritized   Hypothyroidism    Thyroid function was underactive in January and dose was not increased.  Will increase does of levothyroxine and repeat TSH in 3 months  Lab Results  Component Value Date   TSH 8.77* 09/22/2014         Relevant Medications   levothyroxine (SYNTHROID, LEVOTHROID) 88 MCG tablet   Other Relevant Orders   TSH   Hyperlipidemia associated with type 2 diabetes mellitus    Well controlled currently on high potency statin. LDL is < 100 on lipitor 40 mg but triglycerides continue to be  mildly elevated.  Encouraged to resume low GI diet and regular exercise.   Liver enzymes are normal,  No changes today   Lab Results  Component Value Date   CHOL 142 03/23/2015   HDL 45.70 03/23/2015   LDLCALC 62 03/16/2014   LDLDIRECT 76.0 03/23/2015   TRIG 209.0* 03/23/2015   CHOLHDL 3 03/23/2015   Lab Results  Component Value Date   ALT 10 03/23/2015   AST 13 03/23/2015   ALKPHOS 97 03/23/2015   BILITOT 0.6 03/23/2015                      DM type 2, controlled, with complication - Primary    Currently well-controlled on current medications .  hemoglobin A1c is at goal of less than 7.0 . Patient is up to date ion annual eye exam and foot exam is normal today. Patient has no microalbuminuria. Patient is tolerating statin therapy for CAD risk reduction and on ACE/ARB for renal protection and hypertension .  Lab Results  Component Value Date   HGBA1C 6.5 03/23/2015   Lab Results  Component  Value Date   MICROALBUR 1.3 12/22/2014         Relevant Orders   Comprehensive metabolic panel (Completed)   Hemoglobin A1c (Completed)   LDL cholesterol, direct (Completed)   Lipid panel (Completed)   Overweight (BMI 25.0-29.9)    Body mass index is 26.31 kg/(m^2).  Wt Readings from Last 3 Encounters:  03/23/15 148 lb 8 oz (67.359 kg)  03/21/15 149 lb (67.586 kg)  03/02/15 149 lb (67.586 kg)   I have congratulated her in reduction of   BMI and encouraged  continued weight loss with goal of 10% of body weigh over the next 6 months using a low glycemic index diet and regular exercise a minimum of 5 days  per week.        Essential hypertension      I have discontinued Ms. Brotz's zoster vaccine live (PF). I have also changed her levothyroxine. Additionally, I am having her maintain her calcium-vitamin D, aspirin, glucose blood, GE100 BLOOD GLUCOSE TEST, losartan, atorvastatin, and glipiZIDE.  Meds ordered this encounter  Medications  . levothyroxine (SYNTHROID, LEVOTHROID) 88 MCG tablet    Sig: Take 1 tablet (88 mcg total) by mouth daily before breakfast.    Dispense:  90 tablet    Refill:  1    Medications Discontinued During This Encounter  Medication Reason  . zoster vaccine live, PF, (ZOSTAVAX) 24401 UNT/0.65ML injection Patient Preference  . levothyroxine (SYNTHROID, LEVOTHROID) 75 MCG tablet Reorder    Follow-up: Return in about 6 months (around 09/23/2015).   Crecencio Mc, MD

## 2015-03-23 NOTE — Patient Instructions (Signed)
If Your diabetes remains under excellent control  And your cholesterol and other labs are also normal,  I will see you in . 6 months for follow up on diabetes and make sure you are seeing your eye doctor at least once a year.

## 2015-03-25 ENCOUNTER — Encounter: Payer: Self-pay | Admitting: *Deleted

## 2015-03-25 NOTE — Discharge Instructions (Signed)

## 2015-03-26 ENCOUNTER — Encounter: Payer: Self-pay | Admitting: Internal Medicine

## 2015-03-26 ENCOUNTER — Telehealth: Payer: Self-pay | Admitting: Internal Medicine

## 2015-03-26 MED ORDER — LEVOTHYROXINE SODIUM 88 MCG PO TABS: 88.0000 ug | ORAL_TABLET | Freq: Every day | ORAL | Status: DC

## 2015-03-26 NOTE — Assessment & Plan Note (Addendum)
Thyroid function was underactive in January and dose was not increased.  Will increase does of levothyroxine and repeat TSH in 3 months  Lab Results  Component Value Date   TSH 8.77* 09/22/2014

## 2015-03-26 NOTE — Assessment & Plan Note (Signed)
Well controlled currently on high potency statin. LDL is < 100 on lipitor 40 mg but triglycerides continue to be  mildly elevated.  Encouraged to resume low GI diet and regular exercise.   Liver enzymes are normal,  No changes today   Lab Results  Component Value Date   CHOL 142 03/23/2015   HDL 45.70 03/23/2015   LDLCALC 62 03/16/2014   LDLDIRECT 76.0 03/23/2015   TRIG 209.0* 03/23/2015   CHOLHDL 3 03/23/2015   Lab Results  Component Value Date   ALT 10 03/23/2015   AST 13 03/23/2015   ALKPHOS 97 03/23/2015   BILITOT 0.6 03/23/2015

## 2015-03-26 NOTE — Telephone Encounter (Signed)
Thyroid function is overactive on current dose.  I have sent a lower dose of levothyroxine to her pharmacy and would like patient to change dose to 88 mcg daily,  Repeat tsh in 3 months nonfasting.  No other labs required at that time.

## 2015-03-26 NOTE — Assessment & Plan Note (Signed)
Currently well-controlled on current medications .  hemoglobin A1c is at goal of less than 7.0 . Patient is up to date ion annual eye exam and foot exam is normal today. Patient has no microalbuminuria. Patient is tolerating statin therapy for CAD risk reduction and on ACE/ARB for renal protection and hypertension .  Lab Results  Component Value Date   HGBA1C 6.5 03/23/2015   Lab Results  Component Value Date   MICROALBUR 1.3 12/22/2014

## 2015-03-26 NOTE — Assessment & Plan Note (Signed)
Body mass index is 26.31 kg/(m^2).  Wt Readings from Last 3 Encounters:  03/23/15 148 lb 8 oz (67.359 kg)  03/21/15 149 lb (67.586 kg)  03/02/15 149 lb (67.586 kg)   I have congratulated her in reduction of   BMI and encouraged  continued weight loss with goal of 10% of body weigh over the next 6 months using a low glycemic index diet and regular exercise a minimum of 5 days per week.

## 2015-03-28 NOTE — Telephone Encounter (Signed)
Patient notified and lab appointment set.

## 2015-03-30 ENCOUNTER — Ambulatory Visit: Payer: Commercial Managed Care - HMO | Admitting: Anesthesiology

## 2015-03-30 ENCOUNTER — Ambulatory Visit
Admission: RE | Admit: 2015-03-30 | Discharge: 2015-03-30 | Disposition: A | Discharge status: Home / Self Care | Payer: Commercial Managed Care - HMO | Source: Ambulatory Visit | Attending: Ophthalmology | Admitting: Ophthalmology

## 2015-03-30 ENCOUNTER — Encounter
Admission: RE | Disposition: A | Discharge status: Home / Self Care | Payer: Self-pay | Source: Ambulatory Visit | Attending: Ophthalmology

## 2015-03-30 ENCOUNTER — Encounter: Payer: Self-pay | Admitting: Anesthesiology

## 2015-03-30 DIAGNOSIS — E119 Type 2 diabetes mellitus without complications: Secondary | ICD-10-CM | POA: Diagnosis not present

## 2015-03-30 DIAGNOSIS — Z87891 Personal history of nicotine dependence: Secondary | ICD-10-CM | POA: Diagnosis not present

## 2015-03-30 DIAGNOSIS — I10 Essential (primary) hypertension: Secondary | ICD-10-CM | POA: Insufficient documentation

## 2015-03-30 DIAGNOSIS — E780 Pure hypercholesterolemia: Secondary | ICD-10-CM | POA: Diagnosis not present

## 2015-03-30 DIAGNOSIS — H2512 Age-related nuclear cataract, left eye: Secondary | ICD-10-CM | POA: Insufficient documentation

## 2015-03-30 HISTORY — PX: CATARACT EXTRACTION W/PHACO: SHX586

## 2015-03-30 LAB — GLUCOSE, CAPILLARY
Glucose-Capillary: 140 mg/dL — ABNORMAL HIGH (ref 65–99)
Glucose-Capillary: 158 mg/dL — ABNORMAL HIGH (ref 65–99)

## 2015-03-30 SURGERY — CATARACT EXTRACTION PHACO AND INTRAOCULAR LENS PLACEMENT (IOC)
Anesthesia: General | Laterality: Left | Wound class: Clean

## 2015-03-30 MED ORDER — TIMOLOL MALEATE 0.5 % OP SOLN
OPHTHALMIC | Status: DC | PRN
  Administered 2015-03-30: 1 [drp]

## 2015-03-30 MED ORDER — LIDOCAINE HCL (PF) 4 % IJ SOLN: INTRAOCULAR | Status: DC | PRN

## 2015-03-30 MED ORDER — TETRACAINE HCL 0.5 % OP SOLN
1.0000 [drp] | OPHTHALMIC | Status: DC | PRN
  Administered 2015-03-30: 1 [drp] via OPHTHALMIC

## 2015-03-30 MED ORDER — MIDAZOLAM HCL 2 MG/2ML IJ SOLN
INTRAMUSCULAR | Status: DC | PRN
  Administered 2015-03-30: 2 mg via INTRAVENOUS

## 2015-03-30 MED ORDER — BSS IO SOLN
INTRAOCULAR | Status: DC | PRN
  Administered 2015-03-30: 70 mL via OPHTHALMIC

## 2015-03-30 MED ORDER — POVIDONE-IODINE 5 % OP SOLN
1.0000 "application " | OPHTHALMIC | Status: DC | PRN
  Administered 2015-03-30: 1 "application " via OPHTHALMIC

## 2015-03-30 MED ORDER — BRIMONIDINE TARTRATE 0.2 % OP SOLN
OPHTHALMIC | Status: DC | PRN
  Administered 2015-03-30: 1 [drp] via OPHTHALMIC

## 2015-03-30 MED ORDER — NA HYALUR & NA CHOND-NA HYALUR 0.4-0.35 ML IO KIT
PACK | INTRAOCULAR | Status: DC | PRN
  Administered 2015-03-30: 1 mL via INTRAOCULAR

## 2015-03-30 MED ORDER — FENTANYL CITRATE (PF) 100 MCG/2ML IJ SOLN
INTRAMUSCULAR | Status: DC | PRN
  Administered 2015-03-30: 100 ug via INTRAVENOUS

## 2015-03-30 MED ORDER — CEFUROXIME OPHTHALMIC INJECTION 1 MG/0.1 ML
INJECTION | OPHTHALMIC | Status: DC | PRN
  Administered 2015-03-30: 0.1 mL via INTRACAMERAL

## 2015-03-30 MED ORDER — ARMC OPHTHALMIC DILATING GEL
1.0000 "application " | OPHTHALMIC | Status: DC | PRN
  Administered 2015-03-30 (×2): 1 "application " via OPHTHALMIC

## 2015-03-30 SURGICAL SUPPLY — 24 items
CANNULA ANT/CHMB 27GA (MISCELLANEOUS) ×2 IMPLANT
GLOVE SURG LX 7.5 STRW (GLOVE) ×1
GLOVE SURG LX STRL 7.5 STRW (GLOVE) ×1
GLOVE SURG TRIUMPH 8.0 PF LTX (GLOVE) ×2
GOWN STRL REUS W/ TWL LRG LVL3 (GOWN DISPOSABLE) ×2
GOWN STRL REUS W/TWL LRG LVL3 (GOWN DISPOSABLE) ×2
LENS IOL ACRYSOF IQ TORIC 17.0 (Intraocular Lens) ×2 IMPLANT
MARKER SKIN SURG W/RULER VIO (MISCELLANEOUS) ×2
NDL RETROBULBAR .5 NSTRL (NEEDLE)
NEEDLE FILTER BLUNT 18X 1/2SAF (NEEDLE) ×1
NEEDLE FILTER BLUNT 18X1 1/2 (NEEDLE) ×1
PACK CATARACT BRASINGTON (MISCELLANEOUS) ×2
PACK EYE AFTER SURG (MISCELLANEOUS) ×2
PACK OPTHALMIC (MISCELLANEOUS) ×2
RING MALYGIN 7.0 (MISCELLANEOUS)
SUT ETHILON 10-0 CS-B-6CS-B-6 (SUTURE)
SUT VICRYL  9 0 (SUTURE)
SUT VICRYL 9 0 (SUTURE)
SYR 3ML LL SCALE MARK (SYRINGE) ×2
SYR 5ML LL (SYRINGE)
SYR TB 1ML LUER SLIP (SYRINGE) ×2
WATER STERILE IRR 250ML POUR (IV SOLUTION) ×2
WATER STERILE IRR 500ML POUR (IV SOLUTION)
WIPE NON LINTING 3.25X3.25 (MISCELLANEOUS) ×2

## 2015-03-30 NOTE — Transfer of Care (Signed)
Immediate Anesthesia Transfer of Care Note  Patient: [name]  Procedure(s) Performed: Procedure(s) with comments: CATARACT EXTRACTION PHACO AND INTRAOCULAR LENS PLACEMENT (IOC) (Left) - TORIC  DIABETIC - oral meds  Patient Location: PACU  Anesthesia Type: General  Level of Consciousness: awake, alert  and patient cooperative  Airway and Oxygen Therapy: Patient Spontanous Breathing and Patient connected to supplemental oxygen  Post-op Assessment: Post-op Vital signs reviewed, Patient's Cardiovascular Status Stable, Respiratory Function Stable, Patent Airway and No signs of Nausea or vomiting  Post-op Vital Signs: Reviewed and stable  Complications: No apparent anesthesia complications

## 2015-03-30 NOTE — Anesthesia Procedure Notes (Signed)
Procedure Name: MAC Performed by: Dee Maday Pre-anesthesia Checklist: Patient identified, Emergency Drugs available, Suction available, Timeout performed and Patient being monitored Patient Re-evaluated:Patient Re-evaluated prior to inductionOxygen Delivery Method: Nasal cannula Placement Confirmation: positive ETCO2       

## 2015-03-30 NOTE — Anesthesia Postprocedure Evaluation (Signed)
  Anesthesia Post-op Note  Patient: [name]  Procedure(s) Performed: Procedure(s) with comments: CATARACT EXTRACTION PHACO AND INTRAOCULAR LENS PLACEMENT (IOC) (Left) - TORIC  DIABETIC - oral meds  Anesthesia type: MAC  Patient location: PACU  Post pain: Pain level controlled  Post assessment: Post-op Vital signs reviewed, Patient's Cardiovascular Status Stable, Respiratory Function Stable, Patent Airway and No signs of Nausea or vomiting  Post vital signs: Reviewed and stable  Last Vitals:  Filed Vitals:   03/30/15 0817  BP: 131/64  Pulse: 43  Temp:   Resp: 19    Level of consciousness: awake, alert  and patient cooperative  Complications: No apparent anesthesia complications

## 2015-03-30 NOTE — Op Note (Signed)
LOCATION:  [place]   PREOPERATIVE DIAGNOSIS:  Nuclear sclerotic cataract of the left eye.  H25.12  POSTOPERATIVE DIAGNOSIS:  Nuclear sclerotic cataract of the left eye.   PROCEDURE:  Phacoemulsification with Toric posterior chamber intraocular lens placement of the left eye.   LENS:  Implant Name Type Inv. Item Serial No. Manufacturer Lot No. LRB No. Used  toric lens     [id number] ALCON   Left 1   [id number] 17.0 D Toric intraocular lens with 6.0 diopters of cylindrical power with axis orientation at 88 degrees.   ULTRASOUND TIME: 16 % of 1 minutes, 46 seconds.  CDE 17.5   SURGEON:  Wyonia Hough, MD   ANESTHESIA:  Topical with tetracaine drops and 2% Xylocaine jelly.   COMPLICATIONS:  None.   DESCRIPTION OF PROCEDURE:  The patient was identified in the holding room and transported to the operating suite and placed in the supine position under the operating microscope.  The left eye was identified as the operative eye, and it was prepped and draped in the usual sterile ophthalmic fashion.    A clear-corneal paracentesis incision was made at the 1:30 position.  The anterior chamber was filled with Viscoat.  A 2.4 millimeter near clear corneal incision was then made at the 10:30 position.  A cystotome and capsulorrhexis forceps were then used to make a curvilinear capsulorrhexis.  Hydrodissection and hydrodelineation were then performed using balanced salt solution.   Phacoemulsification was then used in stop and chop fashion to remove the lens, nucleus and epinucleus.  The remaining cortex was aspirated using the irrigation and aspiration handpiece.  Provisc viscoelastic was then placed into the capsular bag to distend it for lens placement.  The Verion digital marker was used to align the implant at the intended axis.   A 17.0 diopter lens was then injected into the capsular bag.  It was rotated clockwise until the axis marks on the lens were approximately 15 degrees  in the counterclockwise direction to the intended alignment.  The viscoelastic was aspirated from the eye using the irrigation aspiration handpiece.  Then, a Koch spatula through the sideport incision was used to rotate the lens in a clockwise direction until the axis markings of the intraocular lens were lined up with the Verion alignment.  Balanced salt solution was then used to hydrate the wounds. Cefuroxime 0.1 ml of a 10mg /ml solution was injected into the anterior chamber for a dose of 1 mg of intracameral antibiotic at the completion of the case.    The eye was noted to have a physiologic pressure and there was no wound leak noted.   Timolol and Brimonidine drops were applied to the eye.  The patient was taken to the recovery room in stable condition having had no complications of anesthesia or surgery.  Maite Burlison 03/30/2015, 8:08 AM

## 2015-03-30 NOTE — Anesthesia Preprocedure Evaluation (Signed)
Anesthesia Evaluation  Patient identified by MRN, date of birth, ID band Patient awake    Reviewed: Allergy & Precautions, H&P , NPO status , Patient's Chart, lab work & pertinent test results, reviewed documented beta blocker date and time   Airway Mallampati: II  TM Distance: >3 FB Neck ROM: full    Dental no notable dental hx.    Pulmonary neg pulmonary ROS,  breath sounds clear to auscultation  Pulmonary exam normal       Cardiovascular Exercise Tolerance: Good hypertension, Rhythm:regular Rate:Normal     Neuro/Psych negative neurological ROS  negative psych ROS   GI/Hepatic Neg liver ROS, GERD-  ,  Endo/Other  diabetesHypothyroidism   Renal/GU negative Renal ROS  negative genitourinary   Musculoskeletal   Abdominal   Peds  Hematology negative hematology ROS (+)   Anesthesia Other Findings   Reproductive/Obstetrics negative OB ROS                             Anesthesia Physical Anesthesia Plan  ASA: II  Anesthesia Plan: General   Post-op Pain Management:    Induction:   Airway Management Planned:   Additional Equipment:   Intra-op Plan:   Post-operative Plan:   Informed Consent: I have reviewed the patients History and Physical, chart, labs and discussed the procedure including the risks, benefits and alternatives for the proposed anesthesia with the patient or authorized representative who has indicated his/her understanding and acceptance.   Dental Advisory Given  Plan Discussed with: CRNA  Anesthesia Plan Comments:         Anesthesia Quick Evaluation

## 2015-03-30 NOTE — H&P (Signed)
  The History and Physical notes were scanned in.  The patient remains stable and unchanged from the H&P.   Previous H&P reviewed, patient examined, and there are no changes.  [name] 03/30/2015 7:29 AM

## 2015-03-31 ENCOUNTER — Encounter: Payer: Self-pay | Admitting: Ophthalmology

## 2015-05-10 ENCOUNTER — Other Ambulatory Visit: Payer: Self-pay | Admitting: Internal Medicine

## 2015-06-03 ENCOUNTER — Other Ambulatory Visit: Payer: Self-pay | Admitting: Internal Medicine

## 2015-06-20 ENCOUNTER — Other Ambulatory Visit: Payer: Self-pay | Admitting: Internal Medicine

## 2015-06-24 ENCOUNTER — Other Ambulatory Visit (INDEPENDENT_AMBULATORY_CARE_PROVIDER_SITE_OTHER): Payer: Commercial Managed Care - HMO

## 2015-06-24 DIAGNOSIS — E038 Other specified hypothyroidism: Secondary | ICD-10-CM

## 2015-06-24 DIAGNOSIS — E034 Atrophy of thyroid (acquired): Secondary | ICD-10-CM | POA: Diagnosis not present

## 2015-06-24 LAB — TSH: TSH: 0.84 u[IU]/mL (ref 0.35–4.50)

## 2015-06-27 ENCOUNTER — Encounter: Payer: Self-pay | Admitting: *Deleted

## 2015-06-27 MED ORDER — LEVOTHYROXINE SODIUM 88 MCG PO TABS: 88.0000 ug | ORAL_TABLET | Freq: Every day | ORAL | Status: DC

## 2015-06-27 NOTE — Addendum Note (Signed)
Addended by: Nanci Pina on: 06/27/2015 09:19 AM   Modules accepted: Orders

## 2015-07-19 ENCOUNTER — Telehealth: Payer: Self-pay | Admitting: Internal Medicine

## 2015-07-19 DIAGNOSIS — Z1239 Encounter for other screening for malignant neoplasm of breast: Secondary | ICD-10-CM

## 2015-07-19 NOTE — Telephone Encounter (Signed)
Pt called stating that she needs a mammogram at [facility] and needs the 3D. She does not have an order in EPIC. Please enter and I will call to get her scheduled for this. Thanks! MT

## 2015-07-19 NOTE — Telephone Encounter (Signed)
Done,  Thank you

## 2015-08-02 ENCOUNTER — Other Ambulatory Visit: Payer: Self-pay | Admitting: Internal Medicine

## 2015-08-02 ENCOUNTER — Ambulatory Visit
Admission: RE | Admit: 2015-08-02 | Discharge: 2015-08-02 | Disposition: A | Discharge status: Home / Self Care | Payer: Commercial Managed Care - HMO | Source: Ambulatory Visit | Attending: Internal Medicine | Admitting: Internal Medicine

## 2015-08-02 ENCOUNTER — Telehealth: Payer: Self-pay | Admitting: *Deleted

## 2015-08-02 ENCOUNTER — Telehealth: Payer: Self-pay

## 2015-08-02 DIAGNOSIS — Z1239 Encounter for other screening for malignant neoplasm of breast: Secondary | ICD-10-CM

## 2015-08-02 DIAGNOSIS — R928 Other abnormal and inconclusive findings on diagnostic imaging of breast: Secondary | ICD-10-CM | POA: Insufficient documentation

## 2015-08-02 DIAGNOSIS — N6310 Unspecified lump in the right breast, unspecified quadrant: Secondary | ICD-10-CM

## 2015-08-02 DIAGNOSIS — N63 Unspecified lump in breast: Secondary | ICD-10-CM | POA: Diagnosis present

## 2015-08-02 NOTE — Telephone Encounter (Signed)
[facility] called to get verbal order to obtain  mammogram on patient. She has a lump under her left arm and office would like to work her in. I gave them the ok to do this and they will send over form for you to sign. Thanks

## 2015-08-04 ENCOUNTER — Other Ambulatory Visit: Payer: Self-pay | Admitting: Internal Medicine

## 2015-08-04 DIAGNOSIS — R928 Other abnormal and inconclusive findings on diagnostic imaging of breast: Secondary | ICD-10-CM

## 2015-09-21 ENCOUNTER — Encounter: Payer: Self-pay | Admitting: Internal Medicine

## 2015-09-21 ENCOUNTER — Ambulatory Visit (INDEPENDENT_AMBULATORY_CARE_PROVIDER_SITE_OTHER): Payer: PPO | Admitting: Internal Medicine

## 2015-09-21 VITALS — BP 124/64 | HR 75 | Temp 98.3°F | Resp 12 | Ht 63.0 in | Wt 151.8 lb

## 2015-09-21 DIAGNOSIS — E118 Type 2 diabetes mellitus with unspecified complications: Secondary | ICD-10-CM

## 2015-09-21 DIAGNOSIS — E785 Hyperlipidemia, unspecified: Secondary | ICD-10-CM

## 2015-09-21 DIAGNOSIS — E034 Atrophy of thyroid (acquired): Secondary | ICD-10-CM | POA: Diagnosis not present

## 2015-09-21 DIAGNOSIS — I10 Essential (primary) hypertension: Secondary | ICD-10-CM

## 2015-09-21 DIAGNOSIS — Z23 Encounter for immunization: Secondary | ICD-10-CM

## 2015-09-21 DIAGNOSIS — E1169 Type 2 diabetes mellitus with other specified complication: Secondary | ICD-10-CM

## 2015-09-21 DIAGNOSIS — E038 Other specified hypothyroidism: Secondary | ICD-10-CM

## 2015-09-21 LAB — COMPREHENSIVE METABOLIC PANEL WITH GFR
ALT: 12 U/L (ref 0–35)
AST: 15 U/L (ref 0–37)
Albumin: 4.6 g/dL (ref 3.5–5.2)
Alkaline Phosphatase: 96 U/L (ref 39–117)
BUN: 20 mg/dL (ref 6–23)
CO2: 29 meq/L (ref 19–32)
Calcium: 10.3 mg/dL (ref 8.4–10.5)
Chloride: 102 meq/L (ref 96–112)
Creatinine, Ser: 0.7 mg/dL (ref 0.40–1.20)
GFR: 86.94 mL/min
Glucose, Bld: 121 mg/dL — ABNORMAL HIGH (ref 70–99)
Potassium: 4.7 meq/L (ref 3.5–5.1)
Sodium: 139 meq/L (ref 135–145)
Total Bilirubin: 0.7 mg/dL (ref 0.2–1.2)
Total Protein: 7.4 g/dL (ref 6.0–8.3)

## 2015-09-21 LAB — LIPID PANEL
Cholesterol: 163 mg/dL (ref 0–200)
HDL: 50 mg/dL
NonHDL: 113.23
Total CHOL/HDL Ratio: 3
Triglycerides: 212 mg/dL — ABNORMAL HIGH (ref 0.0–149.0)
VLDL: 42.4 mg/dL — ABNORMAL HIGH (ref 0.0–40.0)

## 2015-09-21 LAB — HEMOGLOBIN A1C: Hgb A1c MFr Bld: 6.7 % — ABNORMAL HIGH (ref 4.6–6.5)

## 2015-09-21 LAB — MICROALBUMIN / CREATININE URINE RATIO
CREATININE, U: 136.3 mg/dL
MICROALB UR: 9.3 mg/dL — AB (ref 0.0–1.9)
Microalb Creat Ratio: 6.8 mg/g (ref 0.0–30.0)

## 2015-09-21 LAB — LDL CHOLESTEROL, DIRECT: Direct LDL: 84 mg/dL

## 2015-09-21 LAB — TSH: TSH: 2.53 u[IU]/mL (ref 0.35–4.50)

## 2015-09-21 LAB — HM DIABETES FOOT EXAM: HM Diabetic Foot Exam: NORMAL

## 2015-09-21 NOTE — Patient Instructions (Addendum)
Continue 1/2 tablet glipizide before morning and evening meal for now   You gained 5 lbs since July!!!!   I want you to Walk or ride the bike  for 30 minutes daily  If your A1c is over 7.0  I will instruct you to increase your glipzide dose

## 2015-09-21 NOTE — Progress Notes (Signed)
Subjective:  Patient ID: [name], female    DOB: 1941/03/02  Age: 75 y.o. MRN: [id number]  CC: The primary encounter diagnosis was Hypothyroidism due to acquired atrophy of thyroid. Diagnoses of Hyperlipidemia associated with type 2 diabetes mellitus ([place]), Essential hypertension, DM type 2, controlled, with complication ([place]), and Need for prophylactic vaccination against Streptococcus pneumoniae (pneumococcus) were also pertinent to this visit.  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 .  Patient has no complaints today.  Patient is following a low glycemic index diet and taking all prescribed medications regularly without side effects.  Fasting sugars have been under less than 140 most of the time and post prandials have been under 160 except on rare occasions. Patient is not exercising and  Not intentionally trying to lose weight .  Patient has had an eye exam in the last 12 months and checks feet regularly for signs of infection.  Patient does not walk barefoot outside,  And denies an numbness tingling or burning in feet. Patient is up to date on all recommended vaccinations  Foot exam normal except for decreased sensation  over bilateral bunions  Which are quite large.     Outpatient Prescriptions Prior to Visit  Medication Sig Dispense Refill  . aspirin 81 MG tablet Take 81 mg by mouth daily.    Marland Kitchen atorvastatin (LIPITOR) 20 MG tablet TAKE ONE TABLET EVERY DAY 90 tablet 0  . calcium-vitamin D (OSCAL WITH D) 500-200 MG-UNIT per tablet Take 1 tablet by mouth daily.      . GE100 BLOOD GLUCOSE TEST test strip CHECK TWICE A DAY 100 each 1  . glipiZIDE (GLUCOTROL) 5 MG tablet TAKE 1/2 (ONE-HALF) TABLET BY MOUTH TWICE DAILY BEFORE A MEAL 30 tablet 3  . glucose blood test strip Check sugars twice daily,ascensia contour micro fill test strips, Dx 250.00 100 each 5  . levothyroxine (SYNTHROID, LEVOTHROID) 88 MCG tablet Take 1 tablet (88 mcg  total) by mouth daily before breakfast. 90 tablet 1  . losartan (COZAAR) 50 MG tablet TAKE ONE TABLET BY MOUTH EVERY DAY 90 tablet 1   No facility-administered medications prior to visit.    Review of Systems;  Patient denies headache, fevers, malaise, unintentional weight loss, skin rash, eye pain, sinus congestion and sinus pain, sore throat, dysphagia,  hemoptysis , cough, dyspnea, wheezing, chest pain, palpitations, orthopnea, edema, abdominal pain, nausea, melena, diarrhea, constipation, flank pain, dysuria, hematuria, urinary  Frequency, nocturia, numbness, tingling, seizures,  Focal weakness, Loss of consciousness,  Tremor, insomnia, depression, anxiety, and suicidal ideation.      Objective:  BP 124/64 mmHg  Pulse 75  Temp(Src) 98.3 F (36.8 C) (Oral)  Resp 12  Ht 5\' 3"  (1.6 m)  Wt 151 lb 12 oz (68.833 kg)  BMI 26.89 kg/m2  SpO2 97%  BP Readings from Last 3 Encounters:  09/21/15 124/64  03/30/15 131/64  03/23/15 138/70    Wt Readings from Last 3 Encounters:  09/21/15 151 lb 12 oz (68.833 kg)  03/30/15 146 lb (66.225 kg)  03/23/15 148 lb 8 oz (67.359 kg)    General appearance: alert, cooperative and appears stated age Ears: normal TM's and external ear canals both ears Throat: lips, mucosa, and tongue normal; teeth and gums normal Neck: no adenopathy, no carotid bruit, supple, symmetrical, trachea midline and thyroid not enlarged, symmetric, no tenderness/mass/nodules Back: symmetric, no curvature. ROM normal. No CVA tenderness. Lungs: clear to auscultation bilaterally Heart: regular  rate and rhythm, S1, S2 normal, no murmur, click, rub or gallop Abdomen: soft, non-tender; bowel sounds normal; no masses,  no organomegaly Pulses: 2+ and symmetric Skin: Skin color, texture, turgor normal. No rashes or lesions Lymph nodes: Cervical, supraclavicular, and axillary nodes normal.  Lab Results  Component Value Date   HGBA1C 6.7* 09/21/2015   HGBA1C 6.5 03/23/2015    HGBA1C 6.7* 12/22/2014    Lab Results  Component Value Date   CREATININE 0.70 09/21/2015   CREATININE 0.76 03/23/2015   CREATININE 0.68 12/22/2014    Lab Results  Component Value Date   WBC 7.1 03/18/2014   HGB 13.3 03/18/2014   HCT 40.0 03/18/2014   PLT 183.0 03/18/2014   GLUCOSE 121* 09/21/2015   CHOL 163 09/21/2015   TRIG 212.0* 09/21/2015   HDL 50.00 09/21/2015   LDLDIRECT 84.0 09/21/2015   LDLCALC 62 03/16/2014   ALT 12 09/21/2015   AST 15 09/21/2015   NA 139 09/21/2015   K 4.7 09/21/2015   CL 102 09/21/2015   CREATININE 0.70 09/21/2015   BUN 20 09/21/2015   CO2 29 09/21/2015   TSH 2.53 09/21/2015   HGBA1C 6.7* 09/21/2015   MICROALBUR 9.3* 09/21/2015    US Breast Gurley Axilla  08/02/2015  CLINICAL DATA:  Soft tissue thickening and tenderness felt by the patient in the inferior right axilla. The patient recently felt twice on her right side with no skin abrasions or lacerations. EXAM: DIGITAL DIAGNOSTIC BILATERAL MAMMOGRAM WITH CAD ULTRASOUND RIGHT BREAST COMPARISON:  Previous exam(s). ACR Breast Density Category b: There are scattered areas of fibroglandular density. FINDINGS: Stable mammographic appearance of the breasts with no findings suspicious for malignancy in either breast. Mammographic images were processed with CAD. On physical exam, there is mild palpable soft tissue thickening deep in the axillary tail region of the right breast, at the inferior right axilla. The patient is mildly tender to palpation at that location. Targeted ultrasound is performed, showing multiple normal appearing right inferior axillary lymph nodes corresponding to the location of palpable thickening and tenderness. No abnormalities were seen. IMPRESSION: No evidence of malignancy. There are normal appearing right inferior axillary lymph nodes at the location of patient concern. These are most likely mildly reactive, based on the mild tenderness and thickening in that region.  RECOMMENDATION: Bilateral screening mammogram in 1 year. The patient was instructed to return sooner if the area that she feels becomes larger and firmer to palpation. I have discussed the findings and recommendations with the patient. Results were also provided in writing at the conclusion of the visit. If applicable, a reminder letter will be sent to the patient regarding the next appointment. BI-RADS CATEGORY  1: Negative. Electronically Signed   By: Claudie Revering M.D.   On: 08/02/2015 15:05   Mm Diag Breast Tomo Bilateral  08/02/2015  CLINICAL DATA:  Soft tissue thickening and tenderness felt by the patient in the inferior right axilla. The patient recently felt twice on her right side with no skin abrasions or lacerations. EXAM: DIGITAL DIAGNOSTIC BILATERAL MAMMOGRAM WITH CAD ULTRASOUND RIGHT BREAST COMPARISON:  Previous exam(s). ACR Breast Density Category b: There are scattered areas of fibroglandular density. FINDINGS: Stable mammographic appearance of the breasts with no findings suspicious for malignancy in either breast. Mammographic images were processed with CAD. On physical exam, there is mild palpable soft tissue thickening deep in the axillary tail region of the right breast, at the inferior right axilla. The patient is mildly tender to  palpation at that location. Targeted ultrasound is performed, showing multiple normal appearing right inferior axillary lymph nodes corresponding to the location of palpable thickening and tenderness. No abnormalities were seen. IMPRESSION: No evidence of malignancy. There are normal appearing right inferior axillary lymph nodes at the location of patient concern. These are most likely mildly reactive, based on the mild tenderness and thickening in that region. RECOMMENDATION: Bilateral screening mammogram in 1 year. The patient was instructed to return sooner if the area that she feels becomes larger and firmer to palpation. I have discussed the findings and  recommendations with the patient. Results were also provided in writing at the conclusion of the visit. If applicable, a reminder letter will be sent to the patient regarding the next appointment. BI-RADS CATEGORY  1: Negative. Electronically Signed   By: Claudie Revering M.D.   On: 08/02/2015 15:05    Assessment & Plan:   Problem List Items Addressed This Visit    Essential hypertension    Well controlled on current regimen. Renal function stable, no changes today.  Lab Results  Component Value Date   CREATININE 0.70 09/21/2015   Lab Results  Component Value Date   NA 139 09/21/2015   K 4.7 09/21/2015   CL 102 09/21/2015   CO2 29 09/21/2015         Relevant Orders   Comprehensive metabolic panel (Completed)   DM type 2, controlled, with complication ([place])    Currently well-controlled on current medications .  hemoglobin A1c is at goal of less than 7.0 . Patient is reminded to schedule an annual eye exam and foot exam is normal today. Patient has no microalbuminuria. Patient is tolerating statin therapy for CAD risk reduction and on ACE/ARB for renal protection and hypertension   Lab Results  Component Value Date   HGBA1C 6.7* 09/21/2015   Lab Results  Component Value Date   MICROALBUR 9.3* 09/21/2015   Lab Results  Component Value Date   CHOL 163 09/21/2015   HDL 50.00 09/21/2015   LDLCALC 62 03/16/2014   LDLDIRECT 84.0 09/21/2015   TRIG 212.0* 09/21/2015   CHOLHDL 3 09/21/2015          Relevant Orders   Hemoglobin A1c (Completed)   Microalbumin / creatinine urine ratio (Completed)   Hypothyroidism - Primary   Relevant Orders   TSH (Completed)   Hyperlipidemia associated with type 2 diabetes mellitus ([place])   Relevant Orders   LDL cholesterol, direct (Completed)   Lipid panel (Completed)    Other Visit Diagnoses    Need for prophylactic vaccination against Streptococcus pneumoniae (pneumococcus)        Relevant Orders    Pneumococcal polysaccharide vaccine  23-valent greater than or equal to 2yo subcutaneous/IM (Completed)      A total of 25 minutes of face to face time was spent with patient more than half of which was spent in counselling about the above mentioned conditions  and coordination of care  I am having Ms. Parrow maintain her calcium-vitamin D, aspirin, glucose blood, GE100 BLOOD GLUCOSE TEST, losartan, glipiZIDE, atorvastatin, and levothyroxine.  No orders of the defined types were placed in this encounter.    There are no discontinued medications.  Follow-up: Return in about 3 months (around 12/20/2015), or annual CPE and wellness .   Crecencio Mc, MD

## 2015-09-21 NOTE — Progress Notes (Signed)
Pre-visit discussion using our clinic review tool. No additional management support is needed unless otherwise documented below in the visit note.  

## 2015-09-24 NOTE — Assessment & Plan Note (Signed)
Well controlled on current regimen. Renal function stable, no changes today.  Lab Results  Component Value Date   CREATININE 0.70 09/21/2015   Lab Results  Component Value Date   NA 139 09/21/2015   K 4.7 09/21/2015   CL 102 09/21/2015   CO2 29 09/21/2015

## 2015-09-24 NOTE — Assessment & Plan Note (Signed)
Currently well-controlled on current medications .  hemoglobin A1c is at goal of less than 7.0 . Patient is reminded to schedule an annual eye exam and foot exam is normal today. Patient has no microalbuminuria. Patient is tolerating statin therapy for CAD risk reduction and on ACE/ARB for renal protection and hypertension   Lab Results  Component Value Date   HGBA1C 6.7* 09/21/2015   Lab Results  Component Value Date   MICROALBUR 9.3* 09/21/2015   Lab Results  Component Value Date   CHOL 163 09/21/2015   HDL 50.00 09/21/2015   LDLCALC 62 03/16/2014   LDLDIRECT 84.0 09/21/2015   TRIG 212.0* 09/21/2015   CHOLHDL 3 09/21/2015

## 2015-09-26 ENCOUNTER — Encounter: Payer: Self-pay | Admitting: *Deleted

## 2015-10-01 ENCOUNTER — Other Ambulatory Visit: Payer: Self-pay | Admitting: Internal Medicine

## 2015-10-25 ENCOUNTER — Other Ambulatory Visit: Payer: Self-pay | Admitting: Internal Medicine

## 2015-11-08 DIAGNOSIS — E119 Type 2 diabetes mellitus without complications: Secondary | ICD-10-CM | POA: Diagnosis not present

## 2015-11-08 LAB — HM DIABETES EYE EXAM

## 2015-11-15 ENCOUNTER — Encounter: Payer: Self-pay | Admitting: Internal Medicine

## 2015-11-15 ENCOUNTER — Other Ambulatory Visit: Payer: Self-pay | Admitting: Internal Medicine

## 2015-11-23 ENCOUNTER — Encounter: Payer: Self-pay | Admitting: Family Medicine

## 2015-11-23 ENCOUNTER — Ambulatory Visit (INDEPENDENT_AMBULATORY_CARE_PROVIDER_SITE_OTHER): Payer: PPO | Admitting: Family Medicine

## 2015-11-23 VITALS — BP 138/68 | HR 72 | Temp 98.2°F | Ht 63.0 in | Wt 155.2 lb

## 2015-11-23 DIAGNOSIS — J988 Other specified respiratory disorders: Secondary | ICD-10-CM | POA: Diagnosis not present

## 2015-11-23 DIAGNOSIS — J020 Streptococcal pharyngitis: Secondary | ICD-10-CM | POA: Diagnosis not present

## 2015-11-23 LAB — POCT RAPID STREP A (OFFICE): Rapid Strep A Screen: NEGATIVE

## 2015-11-23 MED ORDER — AZITHROMYCIN 250 MG PO TABS: ORAL_TABLET | ORAL | Status: DC

## 2015-11-23 MED ORDER — HYDROCOD POLST-CPM POLST ER 10-8 MG/5ML PO SUER: 5.0000 mL | Freq: Two times a day (BID) | ORAL | Status: DC | PRN

## 2015-11-23 NOTE — Assessment & Plan Note (Signed)
New problem. Rapid strep negative today. Treating cough with thousand 8. Given duration of her illness and lack of improvement, patient was given azithromycin she fails to improve or worsens.

## 2015-11-23 NOTE — Patient Instructions (Signed)
Use the tussionex as needed.  You can go ahead and get the antibiotic to be on the safe side.  You can take a probiotic with it to help prevent GI complications.  Follow up as needed.  Take care  Dr. Lacinda Axon

## 2015-11-23 NOTE — Progress Notes (Signed)
Subjective:  Patient ID: [name], female    DOB: June 09, 1941  Age: 75 y.o. MRN: [id number]  CC: Sore throat, cough  HPI:  75 year old female presents to clinic today with the above complaints.  Patient states that she's been sick for approximately 3 weeks. She's been experiencing hoarseness, sore throat, and cough. The sore throat and cough are particularly troublesome for her. No associated fever or chills. Sore throat is mild to moderate in severity. Cough mildly productive. She's been taking her granddaughters amoxicillin with no improvement. No known exacerbating factors.  Social Hx   Social History   Social History  . Marital Status: Widowed    Spouse Name: N/A  . Number of Children: 2  . 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 Smoking status: Never Smoker   . Smokeless tobacco: Never Used  . Alcohol Use: No  . Drug Use: No  . Sexual Activity: Not Asked   Other Topics Concern  . None   Social History Narrative   Widowed in spring of 2012   Lost brother also   Has living will   Son and daughter hold health care POA   Would accept resuscitation but no prolonged artificial ventilation   Not sure about feeding tube   Review of Systems  Constitutional: Negative for fever.  HENT: Positive for sore throat and voice change.   Respiratory: Positive for cough.    Objective:  BP 138/68 mmHg  Pulse 72  Temp(Src) 98.2 F (36.8 C) (Oral)  Ht 5\' 3"  (1.6 m)  Wt 155 lb 3.2 oz (70.398 kg)  BMI 27.50 kg/m2  SpO2 96%  BP/Weight 11/23/2015 09/21/2015 [id number]  Systolic BP 0000000 [id number] 0000000  Diastolic BP 68 64 70  Wt. (Lbs) 155.2 151.75 148.5  BMI 27.5 26.89 26.31   Physical Exam  Constitutional: She is oriented to person, place, and time. She appears well-developed. No distress.  HENT:  Head: Normocephalic and atraumatic.  Foreface with mild to moderate erythema. No exudate appreciated.  Eyes:  Conjunctivae are normal.  Neck: Neck supple.  Cardiovascular: Normal rate and regular rhythm.   Pulmonary/Chest: Effort normal and breath sounds normal.  Lymphadenopathy:    She has no cervical adenopathy.  Neurological: She is alert and oriented to person, place, and time.  Psychiatric: She has a normal mood and affect.  Vitals reviewed.  Lab Results  Component Value Date   WBC 7.1 03/18/2014   HGB 13.3 03/18/2014   HCT 40.0 03/18/2014   PLT 183.0 03/18/2014   GLUCOSE 121* 09/21/2015   CHOL 163 09/21/2015   TRIG 212.0* 09/21/2015   HDL 50.00 09/21/2015   LDLDIRECT 84.0 09/21/2015   LDLCALC 62 03/16/2014   ALT 12 09/21/2015   AST 15 09/21/2015   NA 139 09/21/2015   K 4.7 09/21/2015   CL 102 09/21/2015   CREATININE 0.70 09/21/2015   BUN 20 09/21/2015   CO2 29 09/21/2015   TSH 2.53 09/21/2015   HGBA1C 6.7* 09/21/2015   MICROALBUR 9.3* 09/21/2015    Assessment & Plan:   Problem List Items Addressed This Visit    Respiratory infection - Primary    New problem. Rapid strep negative today. Treating cough with thousand 8. Given duration of her illness and lack of improvement, patient was given azithromycin she fails to improve or worsens.      Relevant Medications   azithromycin (ZITHROMAX) 250 MG tablet  Other Relevant Orders   POCT rapid strep A (Completed)   Culture, Group A Strep      Meds ordered this encounter  Medications  . chlorpheniramine-HYDROcodone (TUSSIONEX PENNKINETIC ER) 10-8 MG/5ML SUER    Sig: Take 5 mLs by mouth every 12 (twelve) hours as needed.    Dispense:  115 mL    Refill:  0  . azithromycin (ZITHROMAX) 250 MG tablet    Sig: 2 tablets on day 1, then 1 tablet daily on days 2-5.    Dispense:  6 tablet    Refill:  0   Follow-up: PRN  [place]

## 2015-11-25 LAB — CULTURE, GROUP A STREP: Organism ID, Bacteria: NORMAL

## 2015-12-21 ENCOUNTER — Ambulatory Visit (INDEPENDENT_AMBULATORY_CARE_PROVIDER_SITE_OTHER): Payer: PPO | Admitting: Internal Medicine

## 2015-12-21 ENCOUNTER — Ambulatory Visit: Payer: PPO

## 2015-12-21 ENCOUNTER — Encounter: Payer: Self-pay | Admitting: Internal Medicine

## 2015-12-21 VITALS — BP 148/80 | HR 54 | Temp 97.7°F | Resp 12 | Ht 63.0 in | Wt 152.8 lb

## 2015-12-21 DIAGNOSIS — I10 Essential (primary) hypertension: Secondary | ICD-10-CM | POA: Diagnosis not present

## 2015-12-21 DIAGNOSIS — E118 Type 2 diabetes mellitus with unspecified complications: Secondary | ICD-10-CM

## 2015-12-21 DIAGNOSIS — E785 Hyperlipidemia, unspecified: Secondary | ICD-10-CM

## 2015-12-21 DIAGNOSIS — E034 Atrophy of thyroid (acquired): Secondary | ICD-10-CM

## 2015-12-21 DIAGNOSIS — E1169 Type 2 diabetes mellitus with other specified complication: Secondary | ICD-10-CM

## 2015-12-21 DIAGNOSIS — Z0000 Encounter for general adult medical examination without abnormal findings: Secondary | ICD-10-CM

## 2015-12-21 DIAGNOSIS — E038 Other specified hypothyroidism: Secondary | ICD-10-CM

## 2015-12-21 DIAGNOSIS — E663 Overweight: Secondary | ICD-10-CM

## 2015-12-21 LAB — HEMOGLOBIN A1C: Hgb A1c MFr Bld: 6.8 % — ABNORMAL HIGH (ref 4.6–6.5)

## 2015-12-21 LAB — COMPREHENSIVE METABOLIC PANEL WITH GFR
ALT: 14 U/L (ref 0–35)
AST: 16 U/L (ref 0–37)
Albumin: 4.4 g/dL (ref 3.5–5.2)
Alkaline Phosphatase: 95 U/L (ref 39–117)
BUN: 25 mg/dL — ABNORMAL HIGH (ref 6–23)
CO2: 30 meq/L (ref 19–32)
Calcium: 10.5 mg/dL (ref 8.4–10.5)
Chloride: 104 meq/L (ref 96–112)
Creatinine, Ser: 0.78 mg/dL (ref 0.40–1.20)
GFR: 76.69 mL/min
Glucose, Bld: 121 mg/dL — ABNORMAL HIGH (ref 70–99)
Potassium: 4.7 meq/L (ref 3.5–5.1)
Sodium: 140 meq/L (ref 135–145)
Total Bilirubin: 0.7 mg/dL (ref 0.2–1.2)
Total Protein: 7.1 g/dL (ref 6.0–8.3)

## 2015-12-21 LAB — TSH: TSH: 1.26 u[IU]/mL (ref 0.35–4.50)

## 2015-12-21 LAB — LDL CHOLESTEROL, DIRECT: Direct LDL: 76 mg/dL

## 2015-12-21 NOTE — Progress Notes (Signed)
Pre-visit discussion using our clinic review tool. No additional management support is needed unless otherwise documented below in the visit note.  

## 2015-12-21 NOTE — Progress Notes (Signed)
  I have reviewed the above information and agree with above.   Doreena Maulden, MD 

## 2015-12-21 NOTE — Patient Instructions (Addendum)
You are doing great!  Your diet is fine, as long as you don't cheat more than once a week  with mac n cheese,  Cornbread   Regular exercise on the treadmill or bike will be a better weight loss regimen than spradic yard work    To make a low carb chip :  Take the [employer] Lavash or Pita bread,  Place on metal cookie sheet  Brush with olive oil  Sprinkle garlic powder (NOT garlic salt), grated parmesan cheese, "mediterranean seasoning ", or all of them!  Bake at 275 for 30  minutes .    Also:   We have substitutions for your potatoes!!  Try the mashed cauliflower and riced cauliflower dishes instead of rice and mashed potatoes by [employer]   Mashed turnips are also very low carb!   For dessert:  Try the [employer] greek yogurt dessert flavors and top with reddi Whip .  8 carbs,  80 calories     Try [employer] Triple Zero [place] Yogurt in the salted caramel, and the coffee flavors  With Whipped Cream for dessert   Fall Prevention in the Home  Falls can cause injuries. They can happen to people of all ages. There are many things you can do to make your home safe and to help prevent falls.  WHAT CAN I DO ON THE OUTSIDE OF MY HOME?  Regularly fix the edges of walkways and driveways and fix any cracks.  Remove anything that might make you trip as you walk through a door, such as a raised step or threshold.  Trim any bushes or trees on the path to your home.  Use bright outdoor lighting.  Clear any walking paths of anything that might make someone trip, such as rocks or tools.  Regularly check to see if handrails are loose or broken. Make sure that both sides of any steps have handrails.  Any raised decks and porches should have guardrails on the edges.  Have any leaves, snow, or ice cleared regularly.  Use sand or salt on walking paths during winter.  Clean up any spills in your garage right away. This includes oil or grease spills. WHAT CAN I DO IN THE  BATHROOM?   Use night lights.  Install grab bars by the toilet and in the tub and shower. Do not use towel bars as grab bars.  Use non-skid mats or decals in the tub or shower.  If you need to sit down in the shower, use a plastic, non-slip stool.  Keep the floor dry. Clean up any water that spills on the floor as soon as it happens.  Remove soap buildup in the tub or shower regularly.  Attach bath mats securely with double-sided non-slip rug tape.  Do not have throw rugs and other things on the floor that can make you trip. WHAT CAN I DO IN THE BEDROOM?  Use night lights.  Make sure that you have a light by your bed that is easy to reach.  Do not use any sheets or blankets that are too big for your bed. They should not hang down onto the floor.  Have a firm chair that has side arms. You can use this for support while you get dressed.  Do not have throw rugs and other things on the floor that can make you trip. WHAT CAN I DO IN THE KITCHEN?  Clean up any spills right away.  Avoid walking on wet floors.  Keep  items that you use a lot in easy-to-reach places.  If you need to reach something above you, use a strong step stool that has a grab bar.  Keep electrical cords out of the way.  Do not use floor polish or wax that makes floors slippery. If you must use wax, use non-skid floor wax.  Do not have throw rugs and other things on the floor that can make you trip. WHAT CAN I DO WITH MY STAIRS?  Do not leave any items on the stairs.  Make sure that there are handrails on both sides of the stairs and use them. Fix handrails that are broken or loose. Make sure that handrails are as long as the stairways.  Check any carpeting to make sure that it is firmly attached to the stairs. Fix any carpet that is loose or worn.  Avoid having throw rugs at the top or bottom of the stairs. If you do have throw rugs, attach them to the floor with carpet tape.  Make sure that you have  a light switch at the top of the stairs and the bottom of the stairs. If you do not have them, ask someone to add them for you. WHAT ELSE CAN I DO TO HELP PREVENT FALLS?  Wear shoes that:  Do not have high heels.  Have rubber bottoms.  Are comfortable and fit you well.  Are closed at the toe. Do not wear sandals.  If you use a stepladder:  Make sure that it is fully opened. Do not climb a closed stepladder.  Make sure that both sides of the stepladder are locked into place.  Ask someone to hold it for you, if possible.  Clearly mark and make sure that you can see:  Any grab bars or handrails.  First and last steps.  Where the edge of each step is.  Use tools that help you move around (mobility aids) if they are needed. These include:  Canes.  Walkers.  Scooters.  Crutches.  Turn on the lights when you go into a dark area. Replace any light bulbs as soon as they burn out.  Set up your furniture so you have a clear path. Avoid moving your furniture around.  If any of your floors are uneven, fix them.  If there are any pets around you, be aware of where they are.  Review your medicines with your doctor. Some medicines can make you feel dizzy. This can increase your chance of falling. Ask your doctor what other things that you can do to help prevent falls.   This information is not intended to replace advice given to you by your health care provider. Make sure you discuss any questions you have with your health care provider.   Document Released: 06/16/2009 Document Revised: 01/04/2015 Document Reviewed: 09/24/2014 Elsevier Interactive Patient Education 2016 [place]. [place] , Thank you for taking time to come for your Medicare Wellness Visit. I appreciate your ongoing commitment to your health goals. Please review the following plan we discussed and let me know if I can assist you in the future.   Return in July for follow up with Dr. Derrel Nip   This is  a list of the screening recommended for you and due dates:  Health Maintenance  Topic Date Due  . Shingles Vaccine  09/27/2001  . DEXA scan (bone density measurement)  09/27/2006  . Hemoglobin A1C  03/20/2016  . Flu Shot  04/03/2016  . Complete foot exam  09/20/2016  . Eye exam for diabetics  11/07/2016  . Colon Cancer Screening  01/29/2017  . Mammogram  08/01/2017  . Tetanus Vaccine  02/04/2024  . Pneumonia vaccines  Completed

## 2015-12-21 NOTE — Progress Notes (Signed)
Subjective:  Patient ID: [name], female    DOB: [birth year]  Age: 75 y.o. MRN: [id number]  CC: The primary encounter diagnosis was DM type 2, controlled, with complication ([place]). Diagnoses of Essential hypertension, Hypothyroidism due to acquired atrophy of thyroid, Routine history and physical examination of adult, Hyperlipidemia associated with type 2 diabetes mellitus ([place]), and Overweight (BMI 25.0-29.9) were also pertinent to this visit.  HPI [name] presents for follow up on Type  2 DM hyperlipidemia hypertension.    Was treated in late March for  3 week history of pharyngitis by Dr Lacinda Axon .Marland Kitchen Strep test was negative,  Had already pretreated with amoxicillin,  But given z pack for  5 days more .  Used the Tussionex at night to resolve the cough.   Working hard in the yard. Has been having some left sided back pain that radiates to thigh but not below  Diet reviewed:  Atkins bars in am.. [place] sandwhich at lunch.  Dinner is grilled meat ,  Green beans.   Foot exam done. Bunions discussed,  No treatment wanted.    Taking 1/2 glipizide twice daily.  Sugars have been elevated per patient but not > 160`  Outpatient Prescriptions Prior to Visit  Medication Sig Dispense Refill  . aspirin 81 MG tablet Take 81 mg by mouth daily.    Marland Kitchen atorvastatin (LIPITOR) 20 MG tablet TAKE ONE TABLET EVERY DAY 90 tablet 3  . calcium-vitamin D (OSCAL WITH D) 500-200 MG-UNIT per tablet Take 1 tablet by mouth daily.      . GE100 BLOOD GLUCOSE TEST test strip CHECK TWICE A DAY 100 each 1  . glipiZIDE (GLUCOTROL) 5 MG tablet ONE-HALF TABLET BY MOUTH TWICE DAILY BEFORE MEALS 30 tablet 3  . glucose blood test strip Check sugars twice daily,[employer] contour micro fill test strips, Dx 250.00 100 each 5  . levothyroxine (SYNTHROID, LEVOTHROID) 88 MCG tablet Take 1 tablet (88 mcg total) by mouth daily before breakfast. 90 tablet 1  . losartan (COZAAR) 50 MG tablet TAKE ONE TABLET BY MOUTH EVERY DAY 90  tablet 1  . ONE TOUCH ULTRA TEST test strip USE AS DIRECTED 100 each 5  . azithromycin (ZITHROMAX) 250 MG tablet 2 tablets on day 1, then 1 tablet daily on days 2-5. 6 tablet 0  . chlorpheniramine-HYDROcodone (TUSSIONEX PENNKINETIC ER) 10-8 MG/5ML SUER Take 5 mLs by mouth every 12 (twelve) hours as needed. 115 mL 0   No facility-administered medications prior to visit.    Review of Systems;  Patient denies headache, fevers, malaise, unintentional weight loss, skin rash, eye pain, sinus congestion and sinus pain, sore throat, dysphagia,  hemoptysis , cough, dyspnea, wheezing, chest pain, palpitations, orthopnea, edema, abdominal pain, nausea, melena, diarrhea, constipation, flank pain, dysuria, hematuria, urinary  Frequency, nocturia, numbness, tingling, seizures,  Focal weakness, Loss of consciousness,  Tremor, insomnia, depression, anxiety, and suicidal ideation.      Objective:  BP 148/80 mmHg  Pulse 54  Temp(Src) 97.7 F (36.5 C) (Oral)  Resp 12  Ht 5\' 3"  (1.6 m)  Wt 152 lb 12 oz (69.287 kg)  BMI 27.07 kg/m2  SpO2 98%  BP Readings from Last 3 Encounters:  12/21/15 148/80  11/23/15 138/68  09/21/15 124/64    Wt Readings from Last 3 Encounters:  12/21/15 152 lb 12 oz (69.287 kg)  11/23/15 155 lb 3.2 oz (70.398 kg)  09/21/15 151 lb 12 oz (68.833 kg)    General appearance: alert, cooperative and  appears stated age Ears: normal TM's and external ear canals both ears Throat: lips, mucosa, and tongue normal; teeth and gums normal Neck: no adenopathy, no carotid bruit, supple, symmetrical, trachea midline and thyroid not enlarged, symmetric, no tenderness/mass/nodules Back: symmetric, no curvature. ROM normal. No CVA tenderness. Lungs: clear to auscultation bilaterally Heart: regular rate and rhythm, S1, S2 normal, no murmur, click, rub or gallop Abdomen: soft, non-tender; bowel sounds normal; no masses,  no organomegaly Pulses: 2+ and symmetric Skin: Skin color, texture,  turgor normal. No rashes or lesions Lymph nodes: Cervical, supraclavicular, and axillary nodes normal.  Lab Results  Component Value Date   HGBA1C 6.8* 12/21/2015   HGBA1C 6.7* 09/21/2015   HGBA1C 6.5 03/23/2015    Lab Results  Component Value Date   CREATININE 0.78 12/21/2015   CREATININE 0.70 09/21/2015   CREATININE 0.76 03/23/2015    Lab Results  Component Value Date   WBC 7.1 03/18/2014   HGB 13.3 03/18/2014   HCT 40.0 03/18/2014   PLT 183.0 03/18/2014   GLUCOSE 121* 12/21/2015   CHOL 163 09/21/2015   TRIG 212.0* 09/21/2015   HDL 50.00 09/21/2015   LDLDIRECT 76.0 12/21/2015   LDLCALC 62 03/16/2014   ALT 14 12/21/2015   AST 16 12/21/2015   NA 140 12/21/2015   K 4.7 12/21/2015   CL 104 12/21/2015   CREATININE 0.78 12/21/2015   BUN 25* 12/21/2015   CO2 30 12/21/2015   TSH 1.26 12/21/2015   HGBA1C 6.8* 12/21/2015   MICROALBUR 9.3* 09/21/2015     Assessment & Plan:   Problem List Items Addressed This Visit    Hypothyroidism    Thyroid function is WNL on current dose.  No current changes needed.   Lab Results  Component Value Date   TSH 1.26 12/21/2015         Relevant Orders   TSH (Completed)   Hyperlipidemia associated with type 2 diabetes mellitus ([place])    Well controlled currently on high potency statin. LDL is < 100 on lipitor 20 mg but triglycerides continue to be  mildly elevated.  Encouraged to resume low GI diet and regular exercise.   Liver enzymes are normal,  No changes today   Lab Results  Component Value Date   CHOL 163 09/21/2015   HDL 50.00 09/21/2015   LDLCALC 62 03/16/2014   LDLDIRECT 76.0 12/21/2015   TRIG 212.0* 09/21/2015   CHOLHDL 3 09/21/2015   Lab Results  Component Value Date   ALT 14 12/21/2015   AST 16 12/21/2015   ALKPHOS 95 12/21/2015   BILITOT 0.7 12/21/2015                        Essential hypertension    Well controlled on current regimen. Renal function stable, no changes today.  Lab  Results  Component Value Date   CREATININE 0.78 12/21/2015   Lab Results  Component Value Date   NA 140 12/21/2015   K 4.7 12/21/2015   CL 104 12/21/2015   CO2 30 12/21/2015         Relevant Orders   LDL cholesterol, direct (Completed)   DM type 2, controlled, with complication ([place]) - Primary    Currently well-controlled on current medications .  hemoglobin A1c is at goal of less than 7.0 . Patient is reminded to schedule an annual eye exam and foot exam is normal today. Patient has minimal  microalbuminuria. Patient is tolerating aspirin and statin therapy for CAD  risk reduction and on ARB for renal protection and hypertension   Lab Results  Component Value Date   HGBA1C 6.8* 12/21/2015   Lab Results  Component Value Date   MICROALBUR 9.3* 09/21/2015   Lab Results  Component Value Date   CHOL 163 09/21/2015   HDL 50.00 09/21/2015   LDLCALC 62 03/16/2014   LDLDIRECT 76.0 12/21/2015   TRIG 212.0* 09/21/2015   CHOLHDL 3 09/21/2015            Relevant Orders   Comprehensive metabolic panel (Completed)   Hemoglobin A1c (Completed)   Overweight (BMI 25.0-29.9)    I have addressed  BMI and recommended a low glycemic index diet utilizing smaller more frequent meals to increase metabolism.  I have also recommended that patient start exercising with a goal of 30 minutes of aerobic exercise a minimum of 5 days per week.         Other Visit Diagnoses    Routine history and physical examination of adult          A total of 25 minutes of face to face time was spent with patient more than half of which was spent in counselling about the above mentioned conditions  and coordination of care .   I have discontinued Ms. Stempel's chlorpheniramine-HYDROcodone and azithromycin. I am also having her maintain her calcium-vitamin D, aspirin, glucose blood, GE100 BLOOD GLUCOSE TEST, losartan, levothyroxine, glipiZIDE, atorvastatin, and ONE TOUCH ULTRA TEST.  No orders of the  defined types were placed in this encounter.    Medications Discontinued During This Encounter  Medication Reason  . azithromycin (ZITHROMAX) 250 MG tablet Completed Course  . chlorpheniramine-HYDROcodone (TUSSIONEX PENNKINETIC ER) 10-8 MG/5ML SUER Completed Course    Follow-up: Return in about 3 months (around 03/21/2016) for follow up diabetes.   Crecencio Mc, MD

## 2015-12-21 NOTE — Progress Notes (Signed)
Subjective:   [name] is a 75 y.o. female who presents for Medicare Annual (Subsequent) preventive examination.  Review of Systems:  No ROS.  Medicare Wellness Visit.  Cardiac Risk Factors include: advanced age (>77men, >79 women);diabetes mellitus;hypertension     Objective:     Vitals: BP 148/80 mmHg  Pulse 54  Temp(Src) 97.7 F (36.5 C) (Oral)  Resp 12  Ht 5\' 3"  (1.6 m)  Wt 152 lb 12 oz (69.287 kg)  BMI 27.07 kg/m2  SpO2 98%  Body mass index is 27.07 kg/(m^2).   Tobacco History  Smoking status  . Never Smoker   Smokeless tobacco  . Never Used     Counseling given: Not Answered   Past Medical History  Diagnosis Date  . GERD (gastroesophageal reflux disease)   . Hyperlipidemia   . Hypertension   . Thyroid disease   . Osteoporosis   . Diabetes mellitus    Past Surgical History  Procedure Laterality Date  . Vaginal delivery    . Cervical polypectomy    . Humerus fracture surgery    . Wrist fracture surgery    . Cataract extraction w/phaco Right 03/02/2015    Procedure: CATARACT EXTRACTION PHACO AND INTRAOCULAR LENS PLACEMENT ([place]) TORIC LENS;  Surgeon: Leandrew Koyanagi, MD;  Location: [facility];  Service: Ophthalmology;  Laterality: Right;  TORIC  . Cataract extraction w/phaco Left 03/30/2015    Procedure: CATARACT EXTRACTION PHACO AND INTRAOCULAR LENS PLACEMENT ([facility]);  Surgeon: Leandrew Koyanagi, MD;  Location: [facility];  Service: Ophthalmology;  Laterality: Left;  TORIC  DIABETIC - oral meds   Family History  Problem Relation Age of Onset  . Hypertension Mother   . Stroke Mother 48    hemorrhagic  . Heart disease Father   . Cancer Father     Lung CA,  died of AMI while in [place] getting Laetr[place]  . Diabetes Brother   . Hypertension Brother   . Hypertension Son   . Heart disease Maternal Grandfather   . Cancer Maternal Grandfather   . Depression Sister   . Breast cancer Maternal Grandmother 70   History  Sexual  Activity  . Sexual Activity: No    Outpatient Encounter Prescriptions as of 12/21/2015  Medication Sig  . aspirin 81 MG tablet Take 81 mg by mouth daily.  Marland Kitchen atorvastatin (LIPITOR) 20 MG tablet TAKE ONE TABLET EVERY DAY  . calcium-vitamin D (OSCAL WITH D) 500-200 MG-UNIT per tablet Take 1 tablet by mouth daily.    . GE100 BLOOD GLUCOSE TEST test strip CHECK TWICE A DAY  . glipiZIDE (GLUCOTROL) 5 MG tablet ONE-HALF TABLET BY MOUTH TWICE DAILY BEFORE MEALS  . glucose blood test strip Check sugars twice daily,[employer] contour micro fill test strips, Dx 250.00  . levothyroxine (SYNTHROID, LEVOTHROID) 88 MCG tablet Take 1 tablet (88 mcg total) by mouth daily before breakfast.  . losartan (COZAAR) 50 MG tablet TAKE ONE TABLET BY MOUTH EVERY DAY  . ONE TOUCH ULTRA TEST test strip USE AS DIRECTED  . [DISCONTINUED] azithromycin (ZITHROMAX) 250 MG tablet 2 tablets on day 1, then 1 tablet daily on days 2-5.  . [DISCONTINUED] chlorpheniramine-HYDROcodone (TUSSIONEX PENNKINETIC ER) 10-8 MG/5ML SUER Take 5 mLs by mouth every 12 (twelve) hours as needed.   No facility-administered encounter medications on file as of 12/21/2015.    Activities of Daily Living In your present state of health, do you have any difficulty performing the following activities: 12/21/2015 03/30/2015  Hearing? N N  Vision? N N  Difficulty concentrating or making decisions? N N  Walking or climbing stairs? N N  Dressing or bathing? N N  Doing errands, shopping? N -  Preparing Food and eating ? N -  Using the Toilet? N -  In the past six months, have you accidently leaked urine? N -  Do you have problems with loss of bowel control? N -  Managing your Medications? N -  Managing your Finances? N -  Housekeeping or managing your Housekeeping? N -    Patient Care Team: Crecencio Mc, MD as PCP - General (Internal Medicine)    Assessment:   This is a routine wellness examination for [name]. The goal of the wellness visit is  to assist the patient how to close the gaps in care and create a preventative care plan for the patient.   Taking VIT D as appropriate/Osteoporosis risk reviewed.  DEXA Scan postponed to be scheduled with upcoming mammogram in the fall.  Future order placed.  Medications reviewed; taking without issues or barriers.  Safety issues reviewed; smoke detectors in the home. Firearms locked in a secure area in the home. Wears seatbelts when driving or riding with others. No violence in the home.  No identified risk were noted; The patient was oriented x 3; appropriate in dress and manner and no objective failures at ADL's or IADL's.    Dental; she has seen her dentist within the past 12 months.  Obesity; discussed the need to eat a healthy diet and exercise.  Encouraged to follow regiment as directed by PCP.  Type 2 diabetes mell-stable and followed by PCP. DMII wo cmp nt st uncntr-stable and followed by PCP.  ZOSTAVAX vaccine postponed, per patient request.   Patient Concerns:  None at this time.  Follow up with PCP as needed.  Exercise Activities and Dietary recommendations Current Exercise Habits: Home exercise routine, Type of exercise: calisthenics;walking, Frequency (Times/Week): 3, Intensity: Moderate  Goals    . Healthy Lifestyle     Stay hydrated and drink plenty of fluids Low carb foods.  Lean meats and vegetables. Stay active and continue exercising.       Fall Risk Fall Risk  12/21/2015 12/22/2014 12/07/2013  Falls in the past year? Yes No No  Number falls in past yr: 1 - -  Injury with Fall? No - -   Depression Screen PHQ 2/9 Scores 12/21/2015 12/22/2014 12/07/2013  PHQ - 2 Score 0 0 0     Cognitive Testing MMSE - Mini Mental State Exam 12/21/2015  Orientation to time 5  Orientation to Place 5  Registration 3  Attention/ Calculation 5  Recall 3  Language- name 2 objects 2  Language- repeat 1  Language- follow 3 step command 3  Language- read & follow direction  1  Write a sentence 1  Copy design 1  Total score 30    Immunization History  Administered Date(s) Administered  . Influenza Split 05/24/2014  . Influenza Whole 08/12/2007  . Influenza, High Dose Seasonal PF 06/06/2015  . Influenza-Unspecified 05/04/2014  . Pneumococcal Conjugate-13 06/21/2014  . Pneumococcal Polysaccharide-23 02/18/2008, 09/21/2015  . Td 10/16/2004  . Tdap 02/03/2014   Screening Tests Health Maintenance  Topic Date Due  . DEXA SCAN  03/21/2016 (Originally 09/27/2006)  . ZOSTAVAX  12/20/2016 (Originally 09/27/2001)  . HEMOGLOBIN A1C  03/20/2016  . INFLUENZA VACCINE  04/03/2016  . FOOT EXAM  09/20/2016  . OPHTHALMOLOGY EXAM  11/07/2016  . COLONOSCOPY  01/29/2017  .  MAMMOGRAM  08/01/2017  . TETANUS/TDAP  02/04/2024  . PNA vac Low Risk Adult  Completed      Plan:   End of life planning; Advance aging; Advanced directives discussed. Copy of current HCPOA/Living Will requested.   During the course of the visit the patient was educated and counseled about the following appropriate screening and preventive services:   Vaccines to include Pneumoccal, Influenza, Hepatitis B, Td, Zostavax, HCV  Electrocardiogram  Cardiovascular Disease  Colorectal cancer screening  Bone density screening  Diabetes screening  Glaucoma screening  Mammography/PAP  Nutrition counseling   Patient Instructions (the written plan) was given to the patient.   Varney Biles, LPN  [id number]

## 2015-12-24 NOTE — Assessment & Plan Note (Signed)
Well controlled on current regimen. Renal function stable, no changes today.  Lab Results  Component Value Date   CREATININE 0.78 12/21/2015   Lab Results  Component Value Date   NA 140 12/21/2015   K 4.7 12/21/2015   CL 104 12/21/2015   CO2 30 12/21/2015

## 2015-12-24 NOTE — Assessment & Plan Note (Signed)
Well controlled currently on high potency statin. LDL is < 100 on lipitor 20 mg but triglycerides continue to be  mildly elevated.  Encouraged to resume low GI diet and regular exercise.   Liver enzymes are normal,  No changes today   Lab Results  Component Value Date   CHOL 163 09/21/2015   HDL 50.00 09/21/2015   LDLCALC 62 03/16/2014   LDLDIRECT 76.0 12/21/2015   TRIG 212.0* 09/21/2015   CHOLHDL 3 09/21/2015   Lab Results  Component Value Date   ALT 14 12/21/2015   AST 16 12/21/2015   ALKPHOS 95 12/21/2015   BILITOT 0.7 12/21/2015

## 2015-12-24 NOTE — Assessment & Plan Note (Signed)
I have addressed  BMI and recommended a low glycemic index diet utilizing smaller more frequent meals to increase metabolism.  I have also recommended that patient start exercising with a goal of 30 minutes of aerobic exercise a minimum of 5 days per week.  

## 2015-12-24 NOTE — Assessment & Plan Note (Signed)
Currently well-controlled on current medications .  hemoglobin A1c is at goal of less than 7.0 . Patient is reminded to schedule an annual eye exam and foot exam is normal today. Patient has minimal  microalbuminuria. Patient is tolerating aspirin and statin therapy for CAD risk reduction and on ARB for renal protection and hypertension   Lab Results  Component Value Date   HGBA1C 6.8* 12/21/2015   Lab Results  Component Value Date   MICROALBUR 9.3* 09/21/2015   Lab Results  Component Value Date   CHOL 163 09/21/2015   HDL 50.00 09/21/2015   LDLCALC 62 03/16/2014   LDLDIRECT 76.0 12/21/2015   TRIG 212.0* 09/21/2015   CHOLHDL 3 09/21/2015

## 2015-12-24 NOTE — Assessment & Plan Note (Signed)
Thyroid function is WNL on current dose.  No current changes needed.   Lab Results  Component Value Date   TSH 1.26 12/21/2015

## 2015-12-26 ENCOUNTER — Ambulatory Visit: Payer: Medicare HMO

## 2015-12-27 ENCOUNTER — Encounter: Payer: Self-pay | Admitting: *Deleted

## 2016-01-06 ENCOUNTER — Other Ambulatory Visit: Payer: Self-pay | Admitting: Internal Medicine

## 2016-01-25 ENCOUNTER — Other Ambulatory Visit: Payer: Self-pay | Admitting: Internal Medicine

## 2016-03-21 ENCOUNTER — Ambulatory Visit (INDEPENDENT_AMBULATORY_CARE_PROVIDER_SITE_OTHER): Payer: PPO | Admitting: Internal Medicine

## 2016-03-21 ENCOUNTER — Encounter: Payer: Self-pay | Admitting: Internal Medicine

## 2016-03-21 VITALS — BP 152/62 | HR 50 | Temp 97.6°F | Resp 12 | Ht 63.0 in | Wt 147.2 lb

## 2016-03-21 DIAGNOSIS — E663 Overweight: Secondary | ICD-10-CM | POA: Diagnosis not present

## 2016-03-21 DIAGNOSIS — E119 Type 2 diabetes mellitus without complications: Secondary | ICD-10-CM | POA: Diagnosis not present

## 2016-03-21 DIAGNOSIS — E118 Type 2 diabetes mellitus with unspecified complications: Secondary | ICD-10-CM | POA: Diagnosis not present

## 2016-03-21 DIAGNOSIS — I10 Essential (primary) hypertension: Secondary | ICD-10-CM

## 2016-03-21 LAB — COMPREHENSIVE METABOLIC PANEL WITH GFR
ALT: 13 U/L (ref 0–35)
AST: 13 U/L (ref 0–37)
Albumin: 4.5 g/dL (ref 3.5–5.2)
Alkaline Phosphatase: 95 U/L (ref 39–117)
BUN: 29 mg/dL — ABNORMAL HIGH (ref 6–23)
CO2: 28 meq/L (ref 19–32)
Calcium: 9.9 mg/dL (ref 8.4–10.5)
Chloride: 105 meq/L (ref 96–112)
Creatinine, Ser: 0.7 mg/dL (ref 0.40–1.20)
GFR: 86.83 mL/min
Glucose, Bld: 139 mg/dL — ABNORMAL HIGH (ref 70–99)
Potassium: 4.5 meq/L (ref 3.5–5.1)
Sodium: 141 meq/L (ref 135–145)
Total Bilirubin: 0.8 mg/dL (ref 0.2–1.2)
Total Protein: 7.2 g/dL (ref 6.0–8.3)

## 2016-03-21 LAB — HEMOGLOBIN A1C: Hgb A1c MFr Bld: 6.4 % (ref 4.6–6.5)

## 2016-03-21 LAB — LIPID PANEL
Cholesterol: 124 mg/dL (ref 0–200)
HDL: 44.9 mg/dL
LDL Cholesterol: 54 mg/dL (ref 0–99)
NonHDL: 79.4
Total CHOL/HDL Ratio: 3
Triglycerides: 127 mg/dL (ref 0.0–149.0)
VLDL: 25.4 mg/dL (ref 0.0–40.0)

## 2016-03-21 LAB — MICROALBUMIN / CREATININE URINE RATIO
CREATININE, U: 120.3 mg/dL
MICROALB UR: 0.7 mg/dL (ref 0.0–1.9)
Microalb Creat Ratio: 0.6 mg/g (ref 0.0–30.0)

## 2016-03-21 NOTE — Progress Notes (Signed)
Subjective:  Patient ID: [name], female    DOB: June 12, 1941  Age: 75 y.o. MRN: [id number]  CC: The primary encounter diagnosis was Diabetes mellitus without complication ([place]). Diagnoses of DM type 2, controlled, with complication ([place]), Overweight (BMI 25.0-29.9), and Essential hypertension were also pertinent to this visit.  HPI [name] presents for 3 month follow up on diabetes.  Patient has no complaints today.  Patient is following a low glycemic index diet and walking 2 miles daily.  She has lost 6 lbs since last visit. She is taking all prescribed medications regularly without side effects.  Fasting sugars have been under less than 140 most of the time and post prandials have been under 160 except on rare occasions. Patient is exercising about 3 times per week and intentionally trying to lose weight .  Patient has had an eye exam in the last 12 months and checks feet regularly for signs of infection.  Patient does not walk barefoot outside,  And denies an numbness tingling or burning in feet. Patient is up to date on all recommended vaccinations  Fasting sugars all < 130 and her post prandial sugars have been < 170  .  Diet is low glycemic    Foot exam normal except for bunions and large callous over right MT head   Outpatient Prescriptions Prior to Visit  Medication Sig Dispense Refill  . aspirin 81 MG tablet Take 81 mg by mouth daily.    Marland Kitchen atorvastatin (LIPITOR) 20 MG tablet TAKE ONE TABLET EVERY DAY 90 tablet 3  . GE100 BLOOD GLUCOSE TEST test strip CHECK TWICE A DAY 100 each 1  . glipiZIDE (GLUCOTROL) 5 MG tablet TAKE 1/2 TABLET BY MOUTH TWICE DAILY BEFORE MEALS 30 tablet 3  . glucose blood test strip Check sugars twice daily,[employer] contour micro fill test strips, Dx 250.00 100 each 5  . levothyroxine (SYNTHROID, LEVOTHROID) 88 MCG tablet Take 1 tablet (88 mcg total) by mouth daily before breakfast. 90 tablet 1  . levothyroxine (SYNTHROID, LEVOTHROID) 88 MCG tablet  TAKE ONE TABLET EVERY DAY BEFORE BREAKFAST 90 tablet 1  . losartan (COZAAR) 50 MG tablet TAKE ONE TABLET EVERY DAY 90 tablet 1  . ONE TOUCH ULTRA TEST test strip USE AS DIRECTED 100 each 5  . calcium-vitamin D (OSCAL WITH D) 500-200 MG-UNIT per tablet Take 1 tablet by mouth daily. Reported on 03/21/2016     No facility-administered medications prior to visit.    Review of Systems;  Patient denies headache, fevers, malaise, unintentional weight loss, skin rash, eye pain, sinus congestion and sinus pain, sore throat, dysphagia,  hemoptysis , cough, dyspnea, wheezing, chest pain, palpitations, orthopnea, edema, abdominal pain, nausea, melena, diarrhea, constipation, flank pain, dysuria, hematuria, urinary  Frequency, nocturia, numbness, tingling, seizures,  Focal weakness, Loss of consciousness,  Tremor, insomnia, depression, anxiety, and suicidal ideation.      Objective:  BP 152/62 mmHg  Pulse 50  Temp(Src) 97.6 F (36.4 C) (Oral)  Resp 12  Ht 5\' 3"  (1.6 m)  Wt 147 lb 4 oz (66.792 kg)  BMI 26.09 kg/m2  SpO2 98%  BP Readings from Last 3 Encounters:  03/21/16 152/62  12/21/15 148/80  11/23/15 138/68    Wt Readings from Last 3 Encounters:  03/21/16 147 lb 4 oz (66.792 kg)  12/21/15 152 lb 12 oz (69.287 kg)  11/23/15 155 lb 3.2 oz (70.398 kg)    General appearance: alert, cooperative and appears stated age Ears: normal TM's and  external ear canals both ears Throat: lips, mucosa, and tongue normal; teeth and gums normal Neck: no adenopathy, no carotid bruit, supple, symmetrical, trachea midline and thyroid not enlarged, symmetric, no tenderness/mass/nodules Back: symmetric, no curvature. ROM normal. No CVA tenderness. Lungs: clear to auscultation bilaterally Heart: regular rate and rhythm, S1, S2 normal, no murmur, click, rub or gallop Abdomen: soft, non-tender; bowel sounds normal; no masses,  no organomegaly Pulses: 2+ and symmetric Skin: Skin color, texture, turgor normal.  No rashes or lesions Lymph nodes: Cervical, supraclavicular, and axillary nodes normal.  Lab Results  Component Value Date   HGBA1C 6.4 03/21/2016   HGBA1C 6.8* 12/21/2015   HGBA1C 6.7* 09/21/2015    Lab Results  Component Value Date   CREATININE 0.70 03/21/2016   CREATININE 0.78 12/21/2015   CREATININE 0.70 09/21/2015    Lab Results  Component Value Date   WBC 7.1 03/18/2014   HGB 13.3 03/18/2014   HCT 40.0 03/18/2014   PLT 183.0 03/18/2014   GLUCOSE 139* 03/21/2016   CHOL 124 03/21/2016   TRIG 127.0 03/21/2016   HDL 44.90 03/21/2016   LDLDIRECT 76.0 12/21/2015   LDLCALC 54 03/21/2016   ALT 13 03/21/2016   AST 13 03/21/2016   NA 141 03/21/2016   K 4.5 03/21/2016   CL 105 03/21/2016   CREATININE 0.70 03/21/2016   BUN 29* 03/21/2016   CO2 28 03/21/2016   TSH 1.26 12/21/2015   HGBA1C 6.4 03/21/2016   MICROALBUR 0.7 03/21/2016    US Breast [place] Axilla  08/02/2015  CLINICAL DATA:  Soft tissue thickening and tenderness felt by the patient in the inferior right axilla. The patient recently felt twice on her right side with no skin abrasions or lacerations. EXAM: DIGITAL DIAGNOSTIC BILATERAL MAMMOGRAM WITH CAD ULTRASOUND RIGHT BREAST COMPARISON:  Previous exam(s). ACR Breast Density Category b: There are scattered areas of fibroglandular density. FINDINGS: Stable mammographic appearance of the breasts with no findings suspicious for malignancy in either breast. Mammographic images were processed with CAD. On physical exam, there is mild palpable soft tissue thickening deep in the axillary tail region of the right breast, at the inferior right axilla. The patient is mildly tender to palpation at that location. Targeted ultrasound is performed, showing multiple normal appearing right inferior axillary lymph nodes corresponding to the location of palpable thickening and tenderness. No abnormalities were seen. IMPRESSION: No evidence of malignancy. There are normal  appearing right inferior axillary lymph nodes at the location of patient concern. These are most likely mildly reactive, based on the mild tenderness and thickening in that region. RECOMMENDATION: Bilateral screening mammogram in 1 year. The patient was instructed to return sooner if the area that she feels becomes larger and firmer to palpation. I have discussed the findings and recommendations with the patient. Results were also provided in writing at the conclusion of the visit. If applicable, a reminder letter will be sent to the patient regarding the next appointment. BI-RADS CATEGORY  1: Negative. Electronically Signed   By: Claudie Revering M.D.   On: 08/02/2015 15:05   Mm Diag Breast Tomo Bilateral  08/02/2015  CLINICAL DATA:  Soft tissue thickening and tenderness felt by the patient in the inferior right axilla. The patient recently felt twice on her right side with no skin abrasions or lacerations. EXAM: DIGITAL DIAGNOSTIC BILATERAL MAMMOGRAM WITH CAD ULTRASOUND RIGHT BREAST COMPARISON:  Previous exam(s). ACR Breast Density Category b: There are scattered areas of fibroglandular density. FINDINGS: Stable mammographic appearance of  the breasts with no findings suspicious for malignancy in either breast. Mammographic images were processed with CAD. On physical exam, there is mild palpable soft tissue thickening deep in the axillary tail region of the right breast, at the inferior right axilla. The patient is mildly tender to palpation at that location. Targeted ultrasound is performed, showing multiple normal appearing right inferior axillary lymph nodes corresponding to the location of palpable thickening and tenderness. No abnormalities were seen. IMPRESSION: No evidence of malignancy. There are normal appearing right inferior axillary lymph nodes at the location of patient concern. These are most likely mildly reactive, based on the mild tenderness and thickening in that region. RECOMMENDATION: Bilateral  screening mammogram in 1 year. The patient was instructed to return sooner if the area that she feels becomes larger and firmer to palpation. I have discussed the findings and recommendations with the patient. Results were also provided in writing at the conclusion of the visit. If applicable, a reminder letter will be sent to the patient regarding the next appointment. BI-RADS CATEGORY  1: Negative. Electronically Signed   By: Claudie Revering M.D.   On: 08/02/2015 15:05    Assessment & Plan:   Problem List Items Addressed This Visit    Essential hypertension    Historically Well controlled on current regimen, butshe has not taken  her medicatio  stoday . Renal function stable, no changes today.  Lab Results  Component Value Date   CREATININE 0.70 03/21/2016   Lab Results  Component Value Date   NA 141 03/21/2016   K 4.5 03/21/2016   CL 105 03/21/2016   CO2 28 03/21/2016           DM type 2, controlled, with complication ([place])    Currently well-controlled on current medications .  hemoglobin A1c is at goal of less than 7.0 . Patient is reminded to schedule an annual eye exam and foot exam is normal today. Patient has minimal  microalbuminuria. Patient is tolerating aspirin and statin therapy for CAD risk reduction and on ARB for renal protection and hypertension   Lab Results  Component Value Date   HGBA1C 6.4 03/21/2016   Lab Results  Component Value Date   MICROALBUR 0.7 03/21/2016   Lab Results  Component Value Date   CHOL 124 03/21/2016   HDL 44.90 03/21/2016   LDLCALC 54 03/21/2016   LDLDIRECT 76.0 12/21/2015   TRIG 127.0 03/21/2016   CHOLHDL 3 03/21/2016              Overweight (BMI 25.0-29.9)    I have congratulated her in reduction of   BMI and encouraged  Continued weight loss with goal of 10% of body weight over the next 6 months using a low glycemic index diet and regular exercise a minimum of 5 days per week.         Other Visit Diagnoses     Diabetes mellitus without complication ([place])    -  Primary    Relevant Orders    Hemoglobin A1c (Completed)    Lipid panel (Completed)    Comprehensive metabolic panel (Completed)    Microalbumin / creatinine urine ratio (Completed)      A total of 25 minutes of face to face time was spent with patient more than half of which was spent in counselling about the above mentioned conditions  and coordination of care   I am having Ms. Plumb maintain her calcium-vitamin D, aspirin, glucose blood, GE100 BLOOD GLUCOSE TEST, levothyroxine,  atorvastatin, ONE TOUCH ULTRA TEST, levothyroxine, losartan, and glipiZIDE.  No orders of the defined types were placed in this encounter.    There are no discontinued medications.  Follow-up: Return in about 3 months (around 06/21/2016) for follow up diabetes, NOT BEFORE OCT 19TH.   Crecencio Mc, MD

## 2016-03-21 NOTE — Patient Instructions (Addendum)
Check your BP at home daily for the next week at various ites and send me the readings   Increase the glipizide dose in the evening to 5 mg and continue 1/2 tablet in the mornings .  If you start having hypoglycemia (low blood sugar, reduce the evening dose to 1/2 tablet again    Hypoglycemia Hypoglycemia occurs when the glucose in your blood is too low. Glucose is a type of sugar that is your body's main energy source. Hormones, such as insulin and glucagon, control the level of glucose in the blood. Insulin lowers blood glucose and glucagon increases blood glucose. Having too much insulin in your blood stream, or not eating enough food containing sugar, can result in hypoglycemia. Hypoglycemia can happen to people with or without diabetes. It can develop quickly and can be a medical emergency.  CAUSES   Missing or delaying meals.  Not eating enough carbohydrates at meals.  Taking too much diabetes medicine.  Not timing your oral diabetes medicine or insulin doses with meals, snacks, and exercise.  Nausea and vomiting.  Certain medicines.  Severe illnesses, such as hepatitis, kidney disorders, and certain eating disorders.  Increased activity or exercise without eating something extra or adjusting medicines.  Drinking too much alcohol.  A nerve disorder that affects body functions like your heart rate, blood pressure, and digestion (autonomic neuropathy).  A condition where the stomach muscles do not function properly (gastroparesis). Therefore, medicines and food may not absorb properly.  Rarely, a tumor of the pancreas can produce too much insulin. SYMPTOMS   Hunger.  Sweating (diaphoresis).  Change in body temperature.  Shakiness.  Headache.  Anxiety.  Lightheadedness.  Irritability.  Difficulty concentrating.  Dry mouth.  Tingling or numbness in the hands or feet.  Restless sleep or sleep disturbances.  Altered speech and coordination.  Change in  mental status.  Seizures or prolonged convulsions.  Combativeness.  Drowsiness (lethargic).  Weakness.  Increased heart rate or palpitations.  Confusion.  Pale, gray skin color.  Blurred or double vision.  Fainting. DIAGNOSIS  A physical exam and medical history will be performed. Your caregiver may make a diagnosis based on your symptoms. Blood tests and other lab tests may be performed to confirm a diagnosis. Once the diagnosis is made, your caregiver will see if your signs and symptoms go away once your blood glucose is raised.  TREATMENT  Usually, you can easily treat your hypoglycemia when you notice symptoms.  Check your blood glucose. If it is less than 70 mg/dl, take one of the following:   3-4 glucose tablets.    cup juice.    cup regular soda.   1 cup skim milk.   -1 tube of glucose gel.   5-6 hard candies.   Avoid high-fat drinks or food that may delay a rise in blood glucose levels.  Do not take more than the recommended amount of sugary foods, drinks, gel, or tablets. Doing so will cause your blood glucose to go too high.   Wait 10-15 minutes and recheck your blood glucose. If it is still less than 70 mg/dl or below your target range, repeat treatment.   Eat a snack if it is more than 1 hour until your next meal.  There may be a time when your blood glucose may go so low that you are unable to treat yourself at home when you start to notice symptoms. You may need someone to help you. You may even faint or be unable  to swallow. If you cannot treat yourself, someone will need to bring you to the hospital.  [place]  If you have diabetes, follow your diabetes management plan by:  Taking your medicines as directed.  Following your exercise plan.  Following your meal plan. Do not skip meals. Eat on time.  Testing your blood glucose regularly. Check your blood glucose before and after exercise. If you exercise longer or  different than usual, be sure to check blood glucose more frequently.  Wearing your medical alert jewelry that says you have diabetes.  Identify the cause of your hypoglycemia. Then, develop ways to prevent the recurrence of hypoglycemia.  Do not take a hot bath or shower right after an insulin shot.  Always carry treatment with you. Glucose tablets are the easiest to carry.  If you are going to drink alcohol, drink it only with meals.  Tell friends or family members ways to keep you safe during a seizure. This may include removing hard or sharp objects from the area or turning you on your side.  Maintain a healthy weight. SEEK MEDICAL CARE IF:   You are having problems keeping your blood glucose in your target range.  You are having frequent episodes of hypoglycemia.  You feel you might be having side effects from your medicines.  You are not sure why your blood glucose is dropping so low.  You notice a change in vision or a new problem with your vision. SEEK IMMEDIATE MEDICAL CARE IF:   Confusion develops.  A change in mental status occurs.  The inability to swallow develops.  Fainting occurs.   This information is not intended to replace advice given to you by your health care provider. Make sure you discuss any questions you have with your health care provider.   Document Released: 08/20/2005 Document Revised: 08/25/2013 Document Reviewed: 04/26/2015 Elsevier Interactive Patient Education [employer].

## 2016-03-21 NOTE — Progress Notes (Signed)
Pre-visit discussion using our clinic review tool. No additional management support is needed unless otherwise documented below in the visit note.  

## 2016-03-24 NOTE — Assessment & Plan Note (Signed)
Currently well-controlled on current medications .  hemoglobin A1c is at goal of less than 7.0 . Patient is reminded to schedule an annual eye exam and foot exam is normal today. Patient has minimal  microalbuminuria. Patient is tolerating aspirin and statin therapy for CAD risk reduction and on ARB for renal protection and hypertension   Lab Results  Component Value Date   HGBA1C 6.4 03/21/2016   Lab Results  Component Value Date   MICROALBUR 0.7 03/21/2016   Lab Results  Component Value Date   CHOL 124 03/21/2016   HDL 44.90 03/21/2016   LDLCALC 54 03/21/2016   LDLDIRECT 76.0 12/21/2015   TRIG 127.0 03/21/2016   CHOLHDL 3 03/21/2016

## 2016-03-24 NOTE — Assessment & Plan Note (Addendum)
I have congratulated her in reduction of   BMI and encouraged  Continued weight loss with goal of 10% of body weight over the next 6 months using a low glycemic index diet and regular exercise a minimum of 5 days per week.     

## 2016-03-24 NOTE — Assessment & Plan Note (Signed)
Historically Well controlled on current regimen, butshe has not taken  her medicatio  stoday . Renal function stable, no changes today.  Lab Results  Component Value Date   CREATININE 0.70 03/21/2016   Lab Results  Component Value Date   NA 141 03/21/2016   K 4.5 03/21/2016   CL 105 03/21/2016   CO2 28 03/21/2016

## 2016-04-16 ENCOUNTER — Telehealth: Payer: Self-pay | Admitting: Internal Medicine

## 2016-04-16 MED ORDER — GLIPIZIDE 5 MG PO TABS: ORAL_TABLET | ORAL | 3 refills | Status: DC

## 2016-04-16 NOTE — Telephone Encounter (Signed)
Pt called about needing a Rx refill for glipiZIDE (GLUCOTROL) 5 MG tablet 30 tablets it's not enough per pt due to her evening dose increased. Pt take 1 whole pill at night and a half at night. Pt states she may 45 pills. No pills left. Please advise?  Pharmacy is [facility], [place]  Call pt @ [phone]. Thank you!

## 2016-04-16 NOTE — Telephone Encounter (Signed)
Sent new rx

## 2016-04-28 ENCOUNTER — Other Ambulatory Visit: Payer: Self-pay | Admitting: Internal Medicine

## 2016-05-22 DIAGNOSIS — L401 Generalized pustular psoriasis: Secondary | ICD-10-CM | POA: Diagnosis not present

## 2016-05-22 DIAGNOSIS — L728 Other follicular cysts of the skin and subcutaneous tissue: Secondary | ICD-10-CM | POA: Diagnosis not present

## 2016-05-22 DIAGNOSIS — Z79899 Other long term (current) drug therapy: Secondary | ICD-10-CM | POA: Diagnosis not present

## 2016-06-19 ENCOUNTER — Other Ambulatory Visit: Payer: Self-pay | Admitting: Internal Medicine

## 2016-06-19 DIAGNOSIS — L401 Generalized pustular psoriasis: Secondary | ICD-10-CM | POA: Diagnosis not present

## 2016-06-21 ENCOUNTER — Ambulatory Visit: Payer: PPO | Admitting: Internal Medicine

## 2016-07-09 ENCOUNTER — Other Ambulatory Visit: Payer: Self-pay | Admitting: Internal Medicine

## 2016-07-09 DIAGNOSIS — Z1231 Encounter for screening mammogram for malignant neoplasm of breast: Secondary | ICD-10-CM

## 2016-07-16 ENCOUNTER — Other Ambulatory Visit: Payer: Self-pay | Admitting: Internal Medicine

## 2016-08-15 ENCOUNTER — Ambulatory Visit
Admission: RE | Admit: 2016-08-15 | Discharge: 2016-08-15 | Disposition: A | Discharge status: Home / Self Care | Payer: PPO | Source: Ambulatory Visit | Attending: Internal Medicine | Admitting: Internal Medicine

## 2016-08-15 DIAGNOSIS — Z1231 Encounter for screening mammogram for malignant neoplasm of breast: Secondary | ICD-10-CM | POA: Insufficient documentation

## 2016-09-06 ENCOUNTER — Other Ambulatory Visit: Payer: Self-pay | Admitting: Internal Medicine

## 2016-09-19 ENCOUNTER — Ambulatory Visit: Payer: PPO | Admitting: Internal Medicine

## 2016-09-30 ENCOUNTER — Other Ambulatory Visit: Payer: Self-pay | Admitting: Internal Medicine

## 2016-10-01 NOTE — Telephone Encounter (Signed)
Pt last refill on glipizide was on 09/06/16. Pt last OV and labs were on 03/21/16. Pt has future appt schedule for 10/19/16. Ok to refill?

## 2016-10-01 NOTE — Telephone Encounter (Signed)
refilled 

## 2016-10-19 ENCOUNTER — Encounter: Payer: Self-pay | Admitting: Internal Medicine

## 2016-10-19 ENCOUNTER — Other Ambulatory Visit: Payer: PPO

## 2016-10-19 ENCOUNTER — Ambulatory Visit (INDEPENDENT_AMBULATORY_CARE_PROVIDER_SITE_OTHER): Payer: PPO | Admitting: Internal Medicine

## 2016-10-19 VITALS — BP 130/68 | HR 90 | Resp 16 | Wt 150.0 lb

## 2016-10-19 DIAGNOSIS — E559 Vitamin D deficiency, unspecified: Secondary | ICD-10-CM

## 2016-10-19 DIAGNOSIS — E038 Other specified hypothyroidism: Secondary | ICD-10-CM

## 2016-10-19 DIAGNOSIS — E063 Autoimmune thyroiditis: Secondary | ICD-10-CM

## 2016-10-19 DIAGNOSIS — E118 Type 2 diabetes mellitus with unspecified complications: Secondary | ICD-10-CM | POA: Diagnosis not present

## 2016-10-19 DIAGNOSIS — I10 Essential (primary) hypertension: Secondary | ICD-10-CM | POA: Diagnosis not present

## 2016-10-19 DIAGNOSIS — E1169 Type 2 diabetes mellitus with other specified complication: Secondary | ICD-10-CM | POA: Diagnosis not present

## 2016-10-19 DIAGNOSIS — E785 Hyperlipidemia, unspecified: Secondary | ICD-10-CM | POA: Diagnosis not present

## 2016-10-19 LAB — VITAMIN D 25 HYDROXY (VIT D DEFICIENCY, FRACTURES): VITD: 28.37 ng/mL — ABNORMAL LOW (ref 30.00–100.00)

## 2016-10-19 LAB — COMPREHENSIVE METABOLIC PANEL WITH GFR
ALT: 14 U/L (ref 0–35)
AST: 13 U/L (ref 0–37)
Albumin: 4.6 g/dL (ref 3.5–5.2)
Alkaline Phosphatase: 117 U/L (ref 39–117)
BUN: 28 mg/dL — ABNORMAL HIGH (ref 6–23)
CO2: 29 meq/L (ref 19–32)
Calcium: 10 mg/dL (ref 8.4–10.5)
Chloride: 103 meq/L (ref 96–112)
Creatinine, Ser: 0.76 mg/dL (ref 0.40–1.20)
GFR: 78.84 mL/min
Glucose, Bld: 151 mg/dL — ABNORMAL HIGH (ref 70–99)
Potassium: 4.4 meq/L (ref 3.5–5.1)
Sodium: 140 meq/L (ref 135–145)
Total Bilirubin: 0.6 mg/dL (ref 0.2–1.2)
Total Protein: 7 g/dL (ref 6.0–8.3)

## 2016-10-19 LAB — TSH: TSH: 0.97 u[IU]/mL (ref 0.35–4.50)

## 2016-10-19 LAB — POCT GLYCOSYLATED HEMOGLOBIN (HGB A1C): Hemoglobin A1C: 6.9

## 2016-10-19 MED ORDER — ALPRAZOLAM 0.25 MG PO TABS: 0.2500 mg | ORAL_TABLET | Freq: Four times a day (QID) | ORAL | 3 refills | Status: DC | PRN

## 2016-10-19 NOTE — Progress Notes (Signed)
Pre visit review using our clinic review tool, if applicable. No additional management support is needed unless otherwise documented below in the visit note. 

## 2016-10-19 NOTE — Patient Instructions (Addendum)
Your diabetes control has slipped a little, AND YOUR a1C IS NOW 6.9    You can Increase your glipzide to 1.5 tablet twice daily  (7.5 mg twice daily)  If this works,  I WILL refill the 5 mg  Dose for #90 per month   START WALKING AGAIN !   I WANT YOU TO LOSE 3 LBS BY YOUR NEXT VISIT  In 4 months

## 2016-10-19 NOTE — Progress Notes (Signed)
Subjective:  Patient ID: [name], female    DOB: 01-07-1941  Age: 76 y.o. MRN: [id number]  CC: The primary encounter diagnosis was Vitamin D deficiency. Diagnoses of DM type 2, controlled, with complication ([place]), Hypothyroidism due to Hashimoto's thyroiditis, Hyperlipidemia associated with type 2 diabetes mellitus ([place]), and Essential hypertension were also pertinent to this visit.  HPI [name] presents for 3 month follow up on diabetes.  Patient has no complaints today.  Patient has been overindulgent and less adherent to a low glycemic index diet but  taking all prescribed medications regularly without side effects.  She has increased her dose of glipizide to 5 mg bid.  And stgars are still elevated.   Fasting sugars have been  less than 200 but greater than 150  Patient is not exercising .  She  checks feet regularly for signs of infection.  Patient does not walk barefoot outside,  And denies any numbness tingling or burning in feet. Patient is up to date on all recommended vaccinations  Lab Results  Component Value Date   HGBA1C 6.9 10/19/2016     Outpatient Medications Prior to Visit  Medication Sig Dispense Refill  . aspirin 81 MG tablet Take 81 mg by mouth daily.    Marland Kitchen atorvastatin (LIPITOR) 20 MG tablet TAKE ONE TABLET EVERY DAY 90 tablet 3  . calcium-vitamin D (OSCAL WITH D) 500-200 MG-UNIT per tablet Take 1 tablet by mouth daily. Reported on 03/21/2016    . GE100 BLOOD GLUCOSE TEST test strip CHECK TWICE A DAY 100 each 1  . glipiZIDE (GLUCOTROL) 5 MG tablet Take one pill in am and 1/2 in the PM 45 tablet 3  . glipiZIDE (GLUCOTROL) 5 MG tablet TAKE ONE-HALF TABLET BY MOUTH TWICE DAILY BEFORE MEALS 30 tablet 2  . glipiZIDE (GLUCOTROL) 5 MG tablet TAKE ONE-HALF TABLET BY MOUTH TWICE DAILY BEFORE MEALS 90 tablet 1  . glucose blood test strip Check sugars twice daily,[employer] contour micro fill test strips, Dx 250.00 100 each 5  . levothyroxine (SYNTHROID, LEVOTHROID) 88  MCG tablet Take 1 tablet (88 mcg total) by mouth daily before breakfast. 90 tablet 1  . levothyroxine (SYNTHROID, LEVOTHROID) 88 MCG tablet TAKE 1 TABLET EVERY DAY BEFORE BREAKFAST 90 tablet 1  . losartan (COZAAR) 50 MG tablet TAKE ONE TABLET EVERY DAY 90 tablet 1  . losartan (COZAAR) 50 MG tablet TAKE ONE TABLET EVERY DAY 90 tablet 1  . ONE TOUCH ULTRA TEST test strip USE AS DIRECTED 100 each 5   No facility-administered medications prior to visit.     Review of Systems;  Patient denies headache, fevers, malaise, unintentional weight loss, skin rash, eye pain, sinus congestion and sinus pain, sore throat, dysphagia,  hemoptysis , cough, dyspnea, wheezing, chest pain, palpitations, orthopnea, edema, abdominal pain, nausea, melena, diarrhea, constipation, flank pain, dysuria, hematuria, urinary  Frequency, nocturia, numbness, tingling, seizures,  Focal weakness, Loss of consciousness,  Tremor, insomnia, depression, anxiety, and suicidal ideation.      Objective:  BP 130/68   Pulse 90   Resp 16   Wt 150 lb (68 kg)   SpO2 97%   BMI 26.57 kg/m   BP Readings from Last 3 Encounters:  10/19/16 130/68  03/21/16 (!) 152/62  12/21/15 (!) 148/80    Wt Readings from Last 3 Encounters:  10/19/16 150 lb (68 kg)  03/21/16 147 lb 4 oz (66.8 kg)  12/21/15 152 lb 12 oz (69.3 kg)    General appearance: alert,  cooperative and appears stated age Ears: normal TM's and external ear canals both ears Throat: lips, mucosa, and tongue normal; teeth and gums normal Neck: no adenopathy, no carotid bruit, supple, symmetrical, trachea midline and thyroid not enlarged, symmetric, no tenderness/mass/nodules Back: symmetric, no curvature. ROM normal. No CVA tenderness. Lungs: clear to auscultation bilaterally Heart: regular rate and rhythm, S1, S2 normal, no murmur, click, rub or gallop Abdomen: soft, non-tender; bowel sounds normal; no masses,  no organomegaly Pulses: 2+ and symmetric Skin: Skin color,  texture, turgor normal. No rashes or lesions Lymph nodes: Cervical, supraclavicular, and axillary nodes normal.  Lab Results  Component Value Date   HGBA1C 6.9 10/19/2016   HGBA1C 6.4 03/21/2016   HGBA1C 6.8 (H) 12/21/2015    Lab Results  Component Value Date   CREATININE 0.76 10/19/2016   CREATININE 0.70 03/21/2016   CREATININE 0.78 12/21/2015    Lab Results  Component Value Date   WBC 7.1 03/18/2014   HGB 13.3 03/18/2014   HCT 40.0 03/18/2014   PLT 183.0 03/18/2014   GLUCOSE 151 (H) 10/19/2016   CHOL 124 03/21/2016   TRIG 127.0 03/21/2016   HDL 44.90 03/21/2016   LDLDIRECT 76.0 12/21/2015   LDLCALC 54 03/21/2016   ALT 14 10/19/2016   AST 13 10/19/2016   NA 140 10/19/2016   K 4.4 10/19/2016   CL 103 10/19/2016   CREATININE 0.76 10/19/2016   BUN 28 (H) 10/19/2016   CO2 29 10/19/2016   TSH 0.97 10/19/2016   HGBA1C 6.9 10/19/2016   MICROALBUR 0.7 03/21/2016    Mm Screening Breast Tomo Bilateral  Result Date: 08/16/2016 CLINICAL DATA:  Screening. EXAM: 2D DIGITAL SCREENING BILATERAL MAMMOGRAM WITH CAD AND ADJUNCT TOMO COMPARISON:  Previous exam(s). ACR Breast Density Category c: The breast tissue is heterogeneously dense, which may obscure small masses. FINDINGS: There are no findings suspicious for malignancy. Images were processed with CAD. IMPRESSION: No mammographic evidence of malignancy. A result letter of this screening mammogram will be mailed directly to the patient. RECOMMENDATION: Screening mammogram in one year. (Code:SM-B-01Y) BI-RADS CATEGORY  1: Negative. Electronically Signed   By: Pamelia Hoit M.D.   On: 08/16/2016 07:19    Assessment & Plan:   Problem List Items Addressed This Visit    DM type 2, controlled, with complication ([place])    Slight loss of control due to dietary indulgences.    hemoglobin A1c has risen to 7.0 and she has increased her glipizide dose to 5 mg bid.  Will increase to 7.5 mg bid  today. She is reminded to schedule an annual eye  exam and foot exam is normal today. Patient has minimal  microalbuminuria. Patient is tolerating aspirin and statin therapy for CAD risk reduction and on ARB for renal protection and hypertension   Lab Results  Component Value Date   HGBA1C 6.9 10/19/2016   Lab Results  Component Value Date   MICROALBUR 0.7 03/21/2016   Lab Results  Component Value Date   CHOL 124 03/21/2016   HDL 44.90 03/21/2016   LDLCALC 54 03/21/2016   LDLDIRECT 76.0 12/21/2015   TRIG 127.0 03/21/2016   CHOLHDL 3 03/21/2016              Relevant Orders   POCT glycosylated hemoglobin (Hb A1C) (Completed)   Comprehensive metabolic panel (Completed)   Essential hypertension    Well controlled on current regimen. Renal function stable, no changes today.  Lab Results  Component Value Date   CREATININE 0.76 10/19/2016  Lab Results  Component Value Date   NA 140 10/19/2016   K 4.4 10/19/2016   CL 103 10/19/2016   CO2 29 10/19/2016         Hyperlipidemia associated with type 2 diabetes mellitus ([place])    Well controlled currently on high potency statin. LDL is < 100 on lipitor 20 mg . liver enzymes are normal,  No changes today   Lab Results  Component Value Date   CHOL 124 03/21/2016   HDL 44.90 03/21/2016   LDLCALC 54 03/21/2016   LDLDIRECT 76.0 12/21/2015   TRIG 127.0 03/21/2016   CHOLHDL 3 03/21/2016   Lab Results  Component Value Date   ALT 14 10/19/2016   AST 13 10/19/2016   ALKPHOS 117 10/19/2016   BILITOT 0.6 10/19/2016                        Hypothyroidism   Relevant Orders   TSH (Completed)    Other Visit Diagnoses    Vitamin D deficiency    -  Primary   Relevant Orders   VITAMIN D 25 Hydroxy (Vit-D Deficiency, Fractures) (Completed)      I am having Ms. Lozinski maintain her calcium-vitamin D, aspirin, glucose blood, GE100 BLOOD GLUCOSE TEST, levothyroxine, atorvastatin, losartan, glipiZIDE, losartan, ONE TOUCH ULTRA TEST, levothyroxine, glipiZIDE,  glipiZIDE, and ALPRAZolam.  Meds ordered this encounter  Medications  . ALPRAZolam (XANAX) 0.25 MG tablet    Sig: Take 1 tablet (0.25 mg total) by mouth every 6 (six) hours as needed for anxiety.    Dispense:  30 tablet    Refill:  3    There are no discontinued medications.  Follow-up: Return in about 4 months (around 02/16/2017) for follow up diabetes.   Crecencio Mc, MD

## 2016-10-21 NOTE — Assessment & Plan Note (Signed)
Well controlled on current regimen. Renal function stable, no changes today.  Lab Results  Component Value Date   CREATININE 0.76 10/19/2016   Lab Results  Component Value Date   NA 140 10/19/2016   K 4.4 10/19/2016   CL 103 10/19/2016   CO2 29 10/19/2016

## 2016-10-21 NOTE — Assessment & Plan Note (Addendum)
Slight loss of control due to dietary indulgences.    hemoglobin A1c has risen to 7.0 and she has increased her glipizide dose to 5 mg bid.  Will increase to 7.5 mg bid  today. She is reminded to schedule an annual eye exam and foot exam is normal today. Patient has minimal  microalbuminuria. Patient is tolerating aspirin and statin therapy for CAD risk reduction and on ARB for renal protection and hypertension   Lab Results  Component Value Date   HGBA1C 6.9 10/19/2016   Lab Results  Component Value Date   MICROALBUR 0.7 03/21/2016   Lab Results  Component Value Date   CHOL 124 03/21/2016   HDL 44.90 03/21/2016   LDLCALC 54 03/21/2016   LDLDIRECT 76.0 12/21/2015   TRIG 127.0 03/21/2016   CHOLHDL 3 03/21/2016

## 2016-10-21 NOTE — Assessment & Plan Note (Signed)
Well controlled currently on high potency statin. LDL is < 100 on lipitor 20 mg . liver enzymes are normal,  No changes today   Lab Results  Component Value Date   CHOL 124 03/21/2016   HDL 44.90 03/21/2016   LDLCALC 54 03/21/2016   LDLDIRECT 76.0 12/21/2015   TRIG 127.0 03/21/2016   CHOLHDL 3 03/21/2016   Lab Results  Component Value Date   ALT 14 10/19/2016   AST 13 10/19/2016   ALKPHOS 117 10/19/2016   BILITOT 0.6 10/19/2016

## 2016-11-17 ENCOUNTER — Other Ambulatory Visit: Payer: Self-pay | Admitting: Internal Medicine

## 2016-11-19 ENCOUNTER — Other Ambulatory Visit: Payer: Self-pay | Admitting: *Deleted

## 2016-11-19 MED ORDER — GLIPIZIDE 5 MG PO TABS: ORAL_TABLET | ORAL | 1 refills | Status: DC

## 2016-11-19 NOTE — Telephone Encounter (Signed)
Pt called stating she needs a refill for medication. Pt needs a whole new Rx due to the medication being increased she ran out of pills. Please advise?  Pharmacy is [facility], [place]  Call pt @ [phone]. Thank you!

## 2016-11-22 NOTE — Telephone Encounter (Signed)
Spoke with pt and she stated that the rx was sent in on Monday and picked up on Monday.

## 2016-11-29 ENCOUNTER — Other Ambulatory Visit: Payer: Self-pay | Admitting: Internal Medicine

## 2016-11-29 ENCOUNTER — Encounter: Payer: Self-pay | Admitting: Gastroenterology

## 2016-12-19 DIAGNOSIS — L821 Other seborrheic keratosis: Secondary | ICD-10-CM | POA: Diagnosis not present

## 2016-12-19 DIAGNOSIS — L400 Psoriasis vulgaris: Secondary | ICD-10-CM | POA: Diagnosis not present

## 2017-01-08 ENCOUNTER — Other Ambulatory Visit: Payer: Self-pay | Admitting: Internal Medicine

## 2017-01-15 ENCOUNTER — Other Ambulatory Visit: Payer: Self-pay

## 2017-01-15 DIAGNOSIS — E119 Type 2 diabetes mellitus without complications: Secondary | ICD-10-CM | POA: Diagnosis not present

## 2017-01-15 LAB — HM DIABETES EYE EXAM

## 2017-01-17 ENCOUNTER — Other Ambulatory Visit: Payer: Self-pay | Admitting: Internal Medicine

## 2017-02-12 ENCOUNTER — Other Ambulatory Visit: Payer: Self-pay | Admitting: Internal Medicine

## 2017-02-18 ENCOUNTER — Telehealth: Payer: Self-pay | Admitting: *Deleted

## 2017-02-18 ENCOUNTER — Ambulatory Visit (INDEPENDENT_AMBULATORY_CARE_PROVIDER_SITE_OTHER): Payer: PPO | Admitting: Internal Medicine

## 2017-02-18 ENCOUNTER — Encounter: Payer: Self-pay | Admitting: Internal Medicine

## 2017-02-18 VITALS — BP 138/72 | HR 50 | Temp 97.9°F | Resp 15 | Ht 63.0 in | Wt 150.0 lb

## 2017-02-18 DIAGNOSIS — I10 Essential (primary) hypertension: Secondary | ICD-10-CM | POA: Diagnosis not present

## 2017-02-18 DIAGNOSIS — E118 Type 2 diabetes mellitus with unspecified complications: Secondary | ICD-10-CM | POA: Diagnosis not present

## 2017-02-18 DIAGNOSIS — Z0000 Encounter for general adult medical examination without abnormal findings: Secondary | ICD-10-CM

## 2017-02-18 DIAGNOSIS — E1169 Type 2 diabetes mellitus with other specified complication: Secondary | ICD-10-CM | POA: Diagnosis not present

## 2017-02-18 DIAGNOSIS — Z780 Asymptomatic menopausal state: Secondary | ICD-10-CM

## 2017-02-18 DIAGNOSIS — Z1211 Encounter for screening for malignant neoplasm of colon: Secondary | ICD-10-CM

## 2017-02-18 DIAGNOSIS — L309 Dermatitis, unspecified: Secondary | ICD-10-CM

## 2017-02-18 DIAGNOSIS — E034 Atrophy of thyroid (acquired): Secondary | ICD-10-CM | POA: Diagnosis not present

## 2017-02-18 DIAGNOSIS — E785 Hyperlipidemia, unspecified: Secondary | ICD-10-CM

## 2017-02-18 LAB — MICROALBUMIN / CREATININE URINE RATIO
Creatinine,U: 127 mg/dL
MICROALB UR: 1.3 mg/dL (ref 0.0–1.9)
Microalb Creat Ratio: 1 mg/g (ref 0.0–30.0)

## 2017-02-18 LAB — LIPID PANEL
Cholesterol: 134 mg/dL (ref 0–200)
HDL: 44.2 mg/dL
LDL Cholesterol: 60 mg/dL (ref 0–99)
NonHDL: 89.94
Total CHOL/HDL Ratio: 3
Triglycerides: 151 mg/dL — ABNORMAL HIGH (ref 0.0–149.0)
VLDL: 30.2 mg/dL (ref 0.0–40.0)

## 2017-02-18 LAB — COMPREHENSIVE METABOLIC PANEL WITH GFR
ALT: 16 U/L (ref 0–35)
AST: 15 U/L (ref 0–37)
Albumin: 4.2 g/dL (ref 3.5–5.2)
Alkaline Phosphatase: 102 U/L (ref 39–117)
BUN: 20 mg/dL (ref 6–23)
CO2: 30 meq/L (ref 19–32)
Calcium: 9.7 mg/dL (ref 8.4–10.5)
Chloride: 104 meq/L (ref 96–112)
Creatinine, Ser: 0.69 mg/dL (ref 0.40–1.20)
GFR: 88.06 mL/min
Glucose, Bld: 139 mg/dL — ABNORMAL HIGH (ref 70–99)
Potassium: 5 meq/L (ref 3.5–5.1)
Sodium: 138 meq/L (ref 135–145)
Total Bilirubin: 0.5 mg/dL (ref 0.2–1.2)
Total Protein: 6.6 g/dL (ref 6.0–8.3)

## 2017-02-18 LAB — TSH: TSH: 1.48 u[IU]/mL (ref 0.35–4.50)

## 2017-02-18 LAB — HEMOGLOBIN A1C: Hgb A1c MFr Bld: 7.2 % — ABNORMAL HIGH (ref 4.6–6.5)

## 2017-02-18 NOTE — Telephone Encounter (Signed)
Patient requested to have labs ordered prior to her follow up 05/23/17

## 2017-02-18 NOTE — Telephone Encounter (Signed)
Labs have already been ordered and lab appt has been scheduled.

## 2017-02-18 NOTE — Patient Instructions (Addendum)
Depending on your A1c today , I might increase the glipizide to 10 mg twice daily with a new prescription    resuming a walking program is a good plan!.  It will lower your sugars .     I have ordered your colonoscopy referral and your bone density test as well    The ShingRx vaccine is available and IS ADVISED for all interested adults over 50 to prevent shingles

## 2017-02-18 NOTE — Progress Notes (Signed)
Subjective:  Patient ID: [name], female    DOB: November 20, 1940  Age: 76 y.o. MRN: [id number]  CC: The primary encounter diagnosis was Colon cancer screening. Diagnoses of Postmenopausal estrogen deficiency, Hyperlipidemia associated with type 2 diabetes mellitus ([place]), DM type 2, controlled, with complication ([place]), Hypothyroidism due to acquired atrophy of thyroid, Routine general medical examination at a health care facility, Essential hypertension, and Eczema of hand were also pertinent to this visit.  HPI [name] presents for follow up on chronic issues.   Due for colon ca screening with [facility] done at [facility] until 2014, now getting annual 3d at [facility]   Had eye exam in May 2018.  Post cataracts with lens 2016  Last a1c 6.9 In Feb  Sugars runnning up to 150 to 170 fasting fo rthe past 2 weeks  Peeling skin on palm of left hand .  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 Doesn't like the cream prescribed by dermatology .  Lab Results  Component Value Date   MICROALBUR 1.3 02/18/2017  micr  Outpatient Medications Prior to Visit  Medication Sig Dispense Refill  . ALPRAZolam (XANAX) 0.25 MG tablet Take 1 tablet (0.25 mg total) by mouth every 6 (six) hours as needed for anxiety. 30 tablet 3  . aspirin 81 MG tablet Take 81 mg by mouth daily.    Marland Kitchen atorvastatin (LIPITOR) 20 MG tablet TAKE ONE TABLET EVERY DAY 90 tablet 1  . calcium-vitamin D (OSCAL WITH D) 500-200 MG-UNIT per tablet Take 1 tablet by mouth daily. Reported on 03/21/2016    . GE100 BLOOD GLUCOSE TEST test strip CHECK TWICE A DAY 100 each 1  . glucose blood test strip Check sugars twice daily,[employer] contour micro fill test strips, Dx 250.00 100 each 5  . levothyroxine (SYNTHROID, LEVOTHROID) 88 MCG tablet Take 1 tablet (88 mcg total) by mouth daily before breakfast. 90 tablet 1  . levothyroxine (SYNTHROID, LEVOTHROID) 88 MCG tablet TAKE ONE TABLET BY MOUTH EVERY DAY BEFORE BREAKFAST  90 tablet 1  . losartan (COZAAR) 50 MG tablet TAKE ONE TABLET EVERY DAY 90 tablet 1  . losartan (COZAAR) 50 MG tablet TAKE ONE TABLET EVERY DAY 30 tablet 0  . ONE TOUCH ULTRA TEST test strip USE AS DIRECTED 100 each 3  . glipiZIDE (GLUCOTROL) 5 MG tablet TAKE 1 & 1/2 TABLETS BY MOUTH TWICE DAILY BEFORE MEALS 90 tablet 1   No facility-administered medications prior to visit.     Review of Systems;  Patient denies headache, fevers, malaise, unintentional weight loss, skin rash, eye pain, sinus congestion and sinus pain, sore throat, dysphagia,  hemoptysis , cough, dyspnea, wheezing, chest pain, palpitations, orthopnea, edema, abdominal pain, nausea, melena, diarrhea, constipation, flank pain, dysuria, hematuria, urinary  Frequency, nocturia, numbness, tingling, seizures,  Focal weakness, Loss of consciousness,  Tremor, insomnia, depression, anxiety, and suicidal ideation.      Objective:  BP 138/72 (BP Location: Left Arm, Patient Position: Sitting, Cuff Size: Normal)   Pulse (!) 50   Temp 97.9 F (36.6 C) (Oral)   Resp 15   Ht 5\' 3"  (1.6 m)   Wt 150 lb (68 kg)   SpO2 97%   BMI 26.57 kg/m   BP Readings from Last 3 Encounters:  02/18/17 138/72  10/19/16 130/68  03/21/16 (!) 152/62    Wt Readings from Last 3 Encounters:  02/18/17 150 lb (68 kg)  10/19/16 150 lb (68 kg)  03/21/16 147 lb 4 oz (  66.8 kg)    General appearance: alert, cooperative and appears stated age Ears: normal TM's and external ear canals both ears Throat: lips, mucosa, and tongue normal; teeth and gums normal Neck: no adenopathy, no carotid bruit, supple, symmetrical, trachea midline and thyroid not enlarged, symmetric, no tenderness/mass/nodules Back: symmetric, no curvature. ROM normal. No CVA tenderness. Lungs: clear to auscultation bilaterally Heart: regular rate and rhythm, S1, S2 normal, no murmur, click, rub or gallop Abdomen: soft, non-tender; bowel sounds normal; no masses,  no organomegaly Pulses:  2+ and symmetric Skin: Skin color, texture, turgor normal. No rashes or lesions Lymph nodes: Cervical, supraclavicular, and axillary nodes normal.  Lab Results  Component Value Date   HGBA1C 7.2 (H) 02/18/2017   HGBA1C 6.9 10/19/2016   HGBA1C 6.4 03/21/2016    Lab Results  Component Value Date   CREATININE 0.69 02/18/2017   CREATININE 0.76 10/19/2016   CREATININE 0.70 03/21/2016    Lab Results  Component Value Date   WBC 7.1 03/18/2014   HGB 13.3 03/18/2014   HCT 40.0 03/18/2014   PLT 183.0 03/18/2014   GLUCOSE 139 (H) 02/18/2017   CHOL 134 02/18/2017   TRIG 151.0 (H) 02/18/2017   HDL 44.20 02/18/2017   LDLDIRECT 76.0 12/21/2015   LDLCALC 60 02/18/2017   ALT 16 02/18/2017   AST 15 02/18/2017   NA 138 02/18/2017   K 5.0 02/18/2017   CL 104 02/18/2017   CREATININE 0.69 02/18/2017   BUN 20 02/18/2017   CO2 30 02/18/2017   TSH 1.48 02/18/2017   HGBA1C 7.2 (H) 02/18/2017   MICROALBUR 1.3 02/18/2017    Mm Screening Breast Tomo Bilateral  Result Date: 08/16/2016 CLINICAL DATA:  Screening. EXAM: 2D DIGITAL SCREENING BILATERAL MAMMOGRAM WITH CAD AND ADJUNCT TOMO COMPARISON:  Previous exam(s). ACR Breast Density Category c: The breast tissue is heterogeneously dense, which may obscure small masses. FINDINGS: There are no findings suspicious for malignancy. Images were processed with CAD. IMPRESSION: No mammographic evidence of malignancy. A result letter of this screening mammogram will be mailed directly to the patient. RECOMMENDATION: Screening mammogram in one year. (Code:SM-B-01Y) BI-RADS CATEGORY  1: Negative. Electronically Signed   By: Pamelia Hoit M.D.   On: 08/16/2016 07:19    Assessment & Plan:   Problem List Items Addressed This Visit    Routine general medical examination at a health care facility    Annual comprehensive preventive exam was done as well as an evaluation and management of chronic conditions .  During the course of the visit the patient was educated  and counseled about appropriate screening and preventive services including :  diabetes screening, lipid analysis with projected  10 year  risk for CAD , nutrition counseling, breast, cervical and colorectal cancer screening, and recommended immunizations.  Printed recommendations for health maintenance screenings was given      Hypothyroidism    Thyroid function is WNL on current dose.  No current changes needed.   Lab Results  Component Value Date   TSH 1.48 02/18/2017         Relevant Orders   TSH (Completed)   Hyperlipidemia associated with type 2 diabetes mellitus ([place])    Well controlled currently on high potency statin. LDL is < 100 on lipitor 20 mg . liver enzymes are normal,  No changes today   Lab Results  Component Value Date   CHOL 134 02/18/2017   HDL 44.20 02/18/2017   LDLCALC 60 02/18/2017   LDLDIRECT 76.0 12/21/2015   TRIG  151.0 (H) 02/18/2017   CHOLHDL 3 02/18/2017   Lab Results  Component Value Date   ALT 16 02/18/2017   AST 15 02/18/2017   ALKPHOS 102 02/18/2017   BILITOT 0.5 02/18/2017                        Relevant Medications   glipiZIDE (GLUCOTROL) 10 MG tablet   Other Relevant Orders   Lipid panel (Completed)   Essential hypertension    Well controlled on current regimen. Renal function stable, no changes today.  Lab Results  Component Value Date   CREATININE 0.69 02/18/2017   Lab Results  Component Value Date   NA 138 02/18/2017   K 5.0 02/18/2017   CL 104 02/18/2017   CO2 30 02/18/2017        Eczema of hand    ADVISED TO apply steroid cream and cover hadn with glove overnight       DM type 2, controlled, with complication ([place])    Continued  loss of control due to dietary indulgences.    hemoglobin A1c has risen to 7.2 and she has increased her glipizide dose to 5 mg bid.  Will increase to 10 mg bid  today. She is reminded to schedule an annual eye exam and foot exam is normal today. Patient has minimal   microalbuminuria. Patient is tolerating aspirin and statin therapy for CAD risk reduction and on ARB for renal protection and hypertension   Lab Results  Component Value Date   HGBA1C 7.2 (H) 02/18/2017   Lab Results  Component Value Date   MICROALBUR 1.3 02/18/2017   Lab Results  Component Value Date   CHOL 134 02/18/2017   HDL 44.20 02/18/2017   LDLCALC 60 02/18/2017   LDLDIRECT 76.0 12/21/2015   TRIG 151.0 (H) 02/18/2017   CHOLHDL 3 02/18/2017              Relevant Medications   glipiZIDE (GLUCOTROL) 10 MG tablet   Other Relevant Orders   Hemoglobin A1c (Completed)   Comprehensive metabolic panel (Completed)   Microalbumin / creatinine urine ratio (Completed)    Other Visit Diagnoses    Colon cancer screening    -  Primary   Relevant Orders   Ambulatory referral to Gastroenterology   Postmenopausal estrogen deficiency       Relevant Orders   DG Bone Density    A total of 40 minutes was spent with patient more than half of which was spent in counseling patient on the above mentioned issues , reviewing and explaining recent labs and imaging studies done, and coordination of care.   I have changed Ms. Schalk's glipiZIDE. I am also having her maintain her calcium-vitamin D, aspirin, glucose blood, GE100 BLOOD GLUCOSE TEST, levothyroxine, losartan, ALPRAZolam, atorvastatin, ONE TOUCH ULTRA TEST, levothyroxine, and losartan.  Meds ordered this encounter  Medications  . glipiZIDE (GLUCOTROL) 10 MG tablet    Sig: Take 1 tablet (10 mg total) by mouth 2 (two) times daily before a meal.    Dispense:  60 tablet    Refill:  5    PLEASE SEND NEW RXTHANK YOU    Medications Discontinued During This Encounter  Medication Reason  . glipiZIDE (GLUCOTROL) 5 MG tablet Reorder    Follow-up: No Follow-up on file.   Crecencio Mc, MD

## 2017-02-19 MED ORDER — GLIPIZIDE 10 MG PO TABS: 10.0000 mg | ORAL_TABLET | Freq: Two times a day (BID) | ORAL | 5 refills | Status: DC

## 2017-02-19 NOTE — Assessment & Plan Note (Signed)
Thyroid function is WNL on current dose.  No current changes needed.   Lab Results  Component Value Date   TSH 1.48 02/18/2017

## 2017-02-19 NOTE — Assessment & Plan Note (Signed)
Annual comprehensive preventive exam was done as well as an evaluation and management of chronic conditions .  During the course of the visit the patient was educated and counseled about appropriate screening and preventive services including :  diabetes screening, lipid analysis with projected  10 year  risk for CAD , nutrition counseling, breast, cervical and colorectal cancer screening, and recommended immunizations.  Printed recommendations for health maintenance screenings was given 

## 2017-02-19 NOTE — Assessment & Plan Note (Signed)
ADVISED TO apply steroid cream and cover hadn with glove overnight

## 2017-02-19 NOTE — Assessment & Plan Note (Signed)
Continued  loss of control due to dietary indulgences.    hemoglobin A1c has risen to 7.2 and she has increased her glipizide dose to 5 mg bid.  Will increase to 10 mg bid  today. She is reminded to schedule an annual eye exam and foot exam is normal today. Patient has minimal  microalbuminuria. Patient is tolerating aspirin and statin therapy for CAD risk reduction and on ARB for renal protection and hypertension   Lab Results  Component Value Date   HGBA1C 7.2 (H) 02/18/2017   Lab Results  Component Value Date   MICROALBUR 1.3 02/18/2017   Lab Results  Component Value Date   CHOL 134 02/18/2017   HDL 44.20 02/18/2017   LDLCALC 60 02/18/2017   LDLDIRECT 76.0 12/21/2015   TRIG 151.0 (H) 02/18/2017   CHOLHDL 3 02/18/2017

## 2017-02-19 NOTE — Assessment & Plan Note (Signed)
Well controlled on current regimen. Renal function stable, no changes today.  Lab Results  Component Value Date   CREATININE 0.69 02/18/2017   Lab Results  Component Value Date   NA 138 02/18/2017   K 5.0 02/18/2017   CL 104 02/18/2017   CO2 30 02/18/2017

## 2017-02-19 NOTE — Assessment & Plan Note (Signed)
Well controlled currently on high potency statin. LDL is < 100 on lipitor 20 mg . liver enzymes are normal,  No changes today   Lab Results  Component Value Date   CHOL 134 02/18/2017   HDL 44.20 02/18/2017   LDLCALC 60 02/18/2017   LDLDIRECT 76.0 12/21/2015   TRIG 151.0 (H) 02/18/2017   CHOLHDL 3 02/18/2017   Lab Results  Component Value Date   ALT 16 02/18/2017   AST 15 02/18/2017   ALKPHOS 102 02/18/2017   BILITOT 0.5 02/18/2017

## 2017-02-21 ENCOUNTER — Other Ambulatory Visit: Payer: Self-pay | Admitting: Internal Medicine

## 2017-03-02 ENCOUNTER — Other Ambulatory Visit: Payer: Self-pay | Admitting: Internal Medicine

## 2017-04-10 ENCOUNTER — Ambulatory Visit (INDEPENDENT_AMBULATORY_CARE_PROVIDER_SITE_OTHER): Payer: PPO

## 2017-04-10 VITALS — BP 124/60 | HR 60 | Temp 98.3°F | Resp 12 | Ht 63.0 in | Wt 148.1 lb

## 2017-04-10 DIAGNOSIS — Z1331 Encounter for screening for depression: Secondary | ICD-10-CM

## 2017-04-10 DIAGNOSIS — Z0000 Encounter for general adult medical examination without abnormal findings: Secondary | ICD-10-CM

## 2017-04-10 DIAGNOSIS — Z1389 Encounter for screening for other disorder: Secondary | ICD-10-CM | POA: Diagnosis not present

## 2017-04-10 NOTE — Progress Notes (Signed)
Subjective:   [name] is a 76 y.o. female who presents for Medicare Annual (Subsequent) preventive examination.  Review of Systems:  No ROS.  Medicare Wellness Visit. Additional risk factors are reflected in the social history. Cardiac Risk Factors include: advanced age (>10men, >72 women);hypertension;diabetes mellitus     Objective:     Vitals: BP 124/60 (BP Location: Left Arm, Patient Position: Sitting, Cuff Size: Normal)   Pulse 60   Temp 98.3 F (36.8 C) (Oral)   Resp 12   Ht 5\' 3"  (1.6 m)   Wt 148 lb 1.9 oz (67.2 kg)   SpO2 97%   BMI 26.24 kg/m   Body mass index is 26.24 kg/m.   Tobacco History  Smoking Status  . Never Smoker  Smokeless Tobacco  . Never Used     Counseling given: Not Answered   Past Medical History:  Diagnosis Date  . Diabetes mellitus   . GERD (gastroesophageal reflux disease)   . Hyperlipidemia   . Hypertension   . Osteoporosis   . Thyroid disease    Past Surgical History:  Procedure Laterality Date  . BREAST CYST ASPIRATION Bilateral 25+ yrs ago  . CATARACT EXTRACTION W/PHACO Right 03/02/2015   Procedure: CATARACT EXTRACTION PHACO AND INTRAOCULAR LENS PLACEMENT ([place]) TORIC LENS;  Surgeon: Leandrew Koyanagi, MD;  Location: [facility];  Service: Ophthalmology;  Laterality: Right;  TORIC  . CATARACT EXTRACTION W/PHACO Left 03/30/2015   Procedure: CATARACT EXTRACTION PHACO AND INTRAOCULAR LENS PLACEMENT ([facility]);  Surgeon: Leandrew Koyanagi, MD;  Location: [facility];  Service: Ophthalmology;  Laterality: Left;  TORIC  DIABETIC - oral meds  . CERVICAL POLYPECTOMY    . HUMERUS FRACTURE SURGERY    . VAGINAL DELIVERY    . WRIST FRACTURE SURGERY     Family History  Problem Relation Age of Onset  . Hypertension Mother   . Stroke Mother 64       hemorrhagic  . Heart disease Father   . Cancer Father        Lung CA,  died of AMI while in [place] getting [place]  . Diabetes Brother   . Hypertension Brother   .  Hypertension Son   . Heart disease Maternal Grandfather   . Cancer Maternal Grandfather   . Breast cancer Maternal Grandmother 5  . Depression Sister    History  Sexual Activity  . Sexual activity: No    Outpatient Encounter Prescriptions as of 04/10/2017  Medication Sig  . ALPRAZolam (XANAX) 0.25 MG tablet Take 1 tablet (0.25 mg total) by mouth every 6 (six) hours as needed for anxiety.  Marland Kitchen aspirin 81 MG tablet Take 81 mg by mouth daily.  Marland Kitchen atorvastatin (LIPITOR) 20 MG tablet TAKE ONE TABLET EVERYDAY  . calcium-vitamin D (OSCAL WITH D) 500-200 MG-UNIT per tablet Take 1 tablet by mouth daily. Reported on 03/21/2016  . GE100 BLOOD GLUCOSE TEST test strip CHECK TWICE A DAY  . glipiZIDE (GLUCOTROL) 10 MG tablet Take 1 tablet (10 mg total) by mouth 2 (two) times daily before a meal.  . glucose blood test strip Check sugars twice daily,[employer]ia contour micro fill test strips, Dx 250.00  . levothyroxine (SYNTHROID, LEVOTHROID) 88 MCG tablet Take 1 tablet (88 mcg total) by mouth daily before breakfast.  . losartan (COZAAR) 50 MG tablet TAKE ONE TABLET EVERY DAY  . ONE TOUCH ULTRA TEST test strip USE AS DIRECTED  . [DISCONTINUED] levothyroxine (SYNTHROID, LEVOTHROID) 88 MCG tablet TAKE ONE TABLET BY MOUTH EVERY  DAY BEFORE BREAKFAST  . [DISCONTINUED] losartan (COZAAR) 50 MG tablet TAKE ONE TABLET EVERY DAY   No facility-administered encounter medications on file as of 04/10/2017.     Activities of Daily Living In your present state of health, do you have any difficulty performing the following activities: 04/10/2017  Hearing? N  Vision? N  Difficulty concentrating or making decisions? N  Walking or climbing stairs? N  Dressing or bathing? N  Doing errands, shopping? N  Preparing Food and eating ? N  Using the Toilet? N  In the past six months, have you accidently leaked urine? N  Do you have problems with loss of bowel control? N  Managing your Medications? N  Managing your Finances? N    Housekeeping or managing your Housekeeping? N  Some recent data might be hidden    Patient Care Team: Crecencio Mc, MD as PCP - General (Internal Medicine)    Assessment:    This is a routine wellness examination for [name]. The goal of the wellness visit is to assist the patient how to close the gaps in care and create a preventative care plan for the patient.   The roster of all physicians providing medical care to patient is listed in the Snapshot section of the chart.  Taking calcium [place] with D as appropriate/Osteoporosis reviewed.    Safety issues reviewed; Smoke and carbon monoxide detectors in the home. No firearms in the home.  Wears seatbelts when driving or riding with others. Patient does wear sunscreen or protective clothing when in direct sunlight. No violence in the home.  Depression- PHQ 2 &9 complete.  No signs/symptoms or verbal communication regarding little pleasure in doing things, feeling down, depressed or hopeless. No changes in sleeping, energy, eating, concentrating.  No thoughts of self harm or harm towards others.  Time spent on this topic is 8 minutes.   Patient is alert, normal appearance, oriented to person/place/and time.  Correctly identified the president of the [place], recall of 3/3 words, and performing simple calculations. Displays appropriate judgement and can read correct time from watch face.   No new identified risk were noted.  No failures at ADL's or IADL's.    BMI- discussed the importance of a healthy diet, water intake and the benefits of aerobic exercise. Educational material provided.   24 hour diet recall: Breakfast: protein bar Lunch: hamburger steak, red potatoes Dinner: half of pimento cheese sandwich  Snack: 2 cheese crackers, 4 pack peanut butter crackers Daily fluid intake: 2 cups of caffeine, 4 cups of water  Dental- every 12 months.  Eye- Visual acuity not assessed per patient preference since they have regular follow  up with the ophthalmologist.  Wears corrective lenses.  Sleep patterns- Sleeps 7 hours at night.  Wakes feeling rested.    Colonoscopy discussed.  Deferred per patient preference.  Bone Density, she plans to schedule within 30 days.    Patient Concerns: None at this time. Follow up with PCP as needed.  Exercise Activities and Dietary recommendations Current Exercise Habits: Home exercise routine, Intensity: Mild  Goals    . Increase physical activity          Buy new walking shoes Stay active and walk the track, moderate pace      Fall Risk Fall Risk  04/10/2017 02/18/2017 12/21/2015 12/22/2014 12/07/2013  Falls in the past year? No No Yes No No  Number falls in past yr: - - 1 - -  Injury with Fall? - -  No - -   Depression Screen PHQ 2/9 Scores 04/10/2017 02/18/2017 12/21/2015 12/22/2014  PHQ - 2 Score 0 0 0 0  PHQ- 9 Score 0 0 - -     Cognitive Function MMSE - Mini Mental State Exam 04/10/2017 12/21/2015  Orientation to time 5 5  Orientation to Place 5 5  Registration 3 3  Attention/ Calculation 5 5  Recall 3 3  Language- name 2 objects 2 2  Language- repeat 1 1  Language- follow 3 step command 3 3  Language- read & follow direction 1 1  Write a sentence 1 1  Copy design 1 1  Total score 30 30        Immunization History  Administered Date(s) Administered  . Influenza Split 05/24/2014  . Influenza Whole 08/12/2007  . Influenza, High Dose Seasonal PF 06/06/2015  . Influenza-Unspecified 05/04/2014, 06/03/2016  . Pneumococcal Conjugate-13 06/21/2014  . Pneumococcal Polysaccharide-23 02/18/2008, 09/21/2015  . Td 10/16/2004  . Tdap 02/03/2014   Screening Tests Health Maintenance  Topic Date Due  . DEXA SCAN  09/27/2006  . COLONOSCOPY  01/29/2017  . INFLUENZA VACCINE  04/03/2017  . HEMOGLOBIN A1C  08/20/2017  . FOOT EXAM  10/19/2017  . OPHTHALMOLOGY EXAM  01/15/2018  . TETANUS/TDAP  02/04/2024  . PNA vac Low Risk Adult  Completed      Plan:    End of life  planning; Advance aging; Advanced directives discussed. Copy of current HCPOA/Living Will requested.    I have personally reviewed and noted the following in the patient's chart:   . Medical and social history . Use of alcohol, tobacco or illicit drugs  . Current medications and supplements . Functional ability and status . Nutritional status . Physical activity . Advanced directives . List of other physicians . Hospitalizations, surgeries, and ER visits in previous 12 months . Vitals . Screenings to include cognitive, depression, and falls . Referrals and appointments  In addition, I have reviewed and discussed with patient certain preventive protocols, quality metrics, and best practice recommendations. A written personalized care plan for preventive services as well as general preventive health recommendations were provided to patient.     OBrien-Blaney, Jamyria Ozanich L, LPN  12/02/9377   I have reviewed the above information and agree with above.   Deborra Medina, MD

## 2017-04-10 NOTE — Patient Instructions (Addendum)
  [name] , Thank you for taking time to come for your Medicare Wellness Visit. I appreciate your ongoing commitment to your health goals. Please review the following plan we discussed and let me know if I can assist you in the future.   Follow up with Dr. Derrel Nip as needed.    Bring a copy of your [place] and/or Living Will to be scanned into chart.  Have a great day!  These are the goals we discussed: Goals    . Increase physical activity          Buy new walking shoes Stay active and walk the track, moderate pace       This is a list of the screening recommended for you and due dates:  Health Maintenance  Topic Date Due  . DEXA scan (bone density measurement)  09/27/2006  . Colon Cancer Screening  01/29/2017  . Flu Shot  04/03/2017  . Hemoglobin A1C  08/20/2017  . Complete foot exam   10/19/2017  . Eye exam for diabetics  01/15/2018  . Tetanus Vaccine  02/04/2024  . Pneumonia vaccines  Completed

## 2017-05-17 ENCOUNTER — Encounter: Payer: Self-pay | Admitting: Internal Medicine

## 2017-05-20 ENCOUNTER — Telehealth: Payer: Self-pay | Admitting: Radiology

## 2017-05-20 DIAGNOSIS — I10 Essential (primary) hypertension: Secondary | ICD-10-CM

## 2017-05-20 DIAGNOSIS — E118 Type 2 diabetes mellitus with unspecified complications: Secondary | ICD-10-CM

## 2017-05-20 DIAGNOSIS — E034 Atrophy of thyroid (acquired): Secondary | ICD-10-CM

## 2017-05-20 DIAGNOSIS — E1169 Type 2 diabetes mellitus with other specified complication: Secondary | ICD-10-CM

## 2017-05-20 DIAGNOSIS — E785 Hyperlipidemia, unspecified: Secondary | ICD-10-CM

## 2017-05-20 NOTE — Telephone Encounter (Signed)
Pt coming in for labs tomorrow, please place future orders. Thank you.  

## 2017-05-20 NOTE — Addendum Note (Signed)
Addended by: Crecencio Mc on: 05/20/2017 12:43 PM   Modules accepted: Orders

## 2017-05-21 ENCOUNTER — Other Ambulatory Visit: Payer: Self-pay | Admitting: Internal Medicine

## 2017-05-21 ENCOUNTER — Other Ambulatory Visit (INDEPENDENT_AMBULATORY_CARE_PROVIDER_SITE_OTHER): Payer: PPO

## 2017-05-21 DIAGNOSIS — E118 Type 2 diabetes mellitus with unspecified complications: Secondary | ICD-10-CM | POA: Diagnosis not present

## 2017-05-21 DIAGNOSIS — I10 Essential (primary) hypertension: Secondary | ICD-10-CM | POA: Diagnosis not present

## 2017-05-21 DIAGNOSIS — E785 Hyperlipidemia, unspecified: Secondary | ICD-10-CM

## 2017-05-21 DIAGNOSIS — E034 Atrophy of thyroid (acquired): Secondary | ICD-10-CM

## 2017-05-21 DIAGNOSIS — E1169 Type 2 diabetes mellitus with other specified complication: Secondary | ICD-10-CM

## 2017-05-21 LAB — COMPREHENSIVE METABOLIC PANEL WITH GFR
ALT: 13 U/L (ref 0–35)
AST: 13 U/L (ref 0–37)
Albumin: 4.4 g/dL (ref 3.5–5.2)
Alkaline Phosphatase: 110 U/L (ref 39–117)
BUN: 24 mg/dL — ABNORMAL HIGH (ref 6–23)
CO2: 28 meq/L (ref 19–32)
Calcium: 10.3 mg/dL (ref 8.4–10.5)
Chloride: 103 meq/L (ref 96–112)
Creatinine, Ser: 0.77 mg/dL (ref 0.40–1.20)
GFR: 77.54 mL/min
Glucose, Bld: 180 mg/dL — ABNORMAL HIGH (ref 70–99)
Potassium: 4.6 meq/L (ref 3.5–5.1)
Sodium: 139 meq/L (ref 135–145)
Total Bilirubin: 0.8 mg/dL (ref 0.2–1.2)
Total Protein: 7.2 g/dL (ref 6.0–8.3)

## 2017-05-21 LAB — LIPID PANEL
Cholesterol: 150 mg/dL (ref 0–200)
HDL: 55.7 mg/dL
NonHDL: 94.3
Total CHOL/HDL Ratio: 3
Triglycerides: 250 mg/dL — ABNORMAL HIGH (ref 0.0–149.0)
VLDL: 50 mg/dL — ABNORMAL HIGH (ref 0.0–40.0)

## 2017-05-21 LAB — LDL CHOLESTEROL, DIRECT: Direct LDL: 72 mg/dL

## 2017-05-21 LAB — HEMOGLOBIN A1C: Hgb A1c MFr Bld: 7 % — ABNORMAL HIGH (ref 4.6–6.5)

## 2017-05-21 LAB — TSH: TSH: 1.75 u[IU]/mL (ref 0.35–4.50)

## 2017-05-23 ENCOUNTER — Ambulatory Visit (INDEPENDENT_AMBULATORY_CARE_PROVIDER_SITE_OTHER): Payer: PPO | Admitting: Internal Medicine

## 2017-05-23 ENCOUNTER — Encounter: Payer: Self-pay | Admitting: Internal Medicine

## 2017-05-23 ENCOUNTER — Ambulatory Visit
Admission: RE | Admit: 2017-05-23 | Discharge: 2017-05-23 | Disposition: A | Discharge status: Home / Self Care | Payer: PPO | Source: Ambulatory Visit | Attending: Internal Medicine | Admitting: Internal Medicine

## 2017-05-23 DIAGNOSIS — E785 Hyperlipidemia, unspecified: Secondary | ICD-10-CM | POA: Diagnosis not present

## 2017-05-23 DIAGNOSIS — E034 Atrophy of thyroid (acquired): Secondary | ICD-10-CM | POA: Diagnosis not present

## 2017-05-23 DIAGNOSIS — M810 Age-related osteoporosis without current pathological fracture: Secondary | ICD-10-CM | POA: Diagnosis not present

## 2017-05-23 DIAGNOSIS — Z13820 Encounter for screening for osteoporosis: Secondary | ICD-10-CM | POA: Insufficient documentation

## 2017-05-23 DIAGNOSIS — L400 Psoriasis vulgaris: Secondary | ICD-10-CM | POA: Diagnosis not present

## 2017-05-23 DIAGNOSIS — E118 Type 2 diabetes mellitus with unspecified complications: Secondary | ICD-10-CM | POA: Diagnosis not present

## 2017-05-23 DIAGNOSIS — Z780 Asymptomatic menopausal state: Secondary | ICD-10-CM | POA: Insufficient documentation

## 2017-05-23 DIAGNOSIS — I10 Essential (primary) hypertension: Secondary | ICD-10-CM | POA: Diagnosis not present

## 2017-05-23 DIAGNOSIS — E1169 Type 2 diabetes mellitus with other specified complication: Secondary | ICD-10-CM

## 2017-05-23 DIAGNOSIS — L309 Dermatitis, unspecified: Secondary | ICD-10-CM | POA: Diagnosis not present

## 2017-05-23 NOTE — Patient Instructions (Addendum)
Your diabetes control is improving !   Your a1c has improved to 7.0 and your cholesterol and Other labs are excellent. .   Please resume your daily walking program and return in 3 months    increase your daily vitamin D intake to 1000  Iu's daily     I recommend getting 1200 mg of calcium intake daily.  Please try to get the majority of your calcium and Vitamin D  through diet rather than supplements given the recent association of calcium supplements with increased coronary artery calcium scores.  Please use the dietary calcium list to help you achieve this   We  Will order the cologuard   As your colon cancer screeningTest and it will come to your house with instructions.

## 2017-05-23 NOTE — Progress Notes (Signed)
Subjective:  Patient ID: [name], female    DOB: Dec 28, 1940  Age: 76 y.o. MRN: [id number]  CC: Diagnoses of Osteoporosis, post-menopausal, DM type 2, controlled, with complication ([place]), Essential hypertension, Hyperlipidemia associated with type 2 diabetes mellitus ([place]), Hypothyroidism due to acquired atrophy of thyroid, and Eczema of hand were pertinent to this visit.  HPI [name] presents for folow up 3 month follow up on diabetes.  Patient has no complaints today.  Patient is following a low glycemic index diet and taking all prescribed medications regularly without side effects.  Fasting sugars have been under less than 140 most of the time and post prandials have been under 160 except on rare occasions. Patient is exercising about 3 times per week and intentionally trying to lose weight .  Patient has had an eye exam in the last 12 months and checks feet regularly for signs of infection.  Patient does not walk barefoot outside,  And denies an numbness tingling or burning in feet. Patient is up to date on all recommended vaccinations  Seasonal  psoriasis vs  Eczema on hands,  Irritation and desquamation has been  out of control for the past week, ran out of ointment.  Seeing dermatology today  DEXA today as well  Eye  Exam up to date  Taking vitamin D 500 Ius daily     Outpatient Medications Prior to Visit  Medication Sig Dispense Refill  . ALPRAZolam (XANAX) 0.25 MG tablet Take 1 tablet (0.25 mg total) by mouth every 6 (six) hours as needed for anxiety. 30 tablet 3  . aspirin 81 MG tablet Take 81 mg by mouth daily.    Marland Kitchen atorvastatin (LIPITOR) 20 MG tablet TAKE ONE TABLET EVERYDAY 90 tablet 1  . calcium-vitamin D (OSCAL WITH D) 500-200 MG-UNIT per tablet Take 1 tablet by mouth daily. Reported on 03/21/2016    . GE100 BLOOD GLUCOSE TEST test strip CHECK TWICE A DAY 100 each 1  . glipiZIDE (GLUCOTROL) 10 MG tablet Take 1 tablet (10 mg total) by mouth 2 (two) times daily  before a meal. 60 tablet 5  . glucose blood test strip Check sugars twice daily,ascensia contour micro fill test strips, Dx 250.00 100 each 5  . levothyroxine (SYNTHROID, LEVOTHROID) 88 MCG tablet Take 1 tablet (88 mcg total) by mouth daily before breakfast. 90 tablet 1  . losartan (COZAAR) 50 MG tablet TAKE ONE TABLET EVERY DAY 90 tablet 1  . ONE TOUCH ULTRA TEST test strip USE AS DIRECTED 100 each 1   No facility-administered medications prior to visit.     Review of Systems;  Patient denies headache, fevers, malaise, unintentional weight loss, skin rash, eye pain, sinus congestion and sinus pain, sore throat, dysphagia,  hemoptysis , cough, dyspnea, wheezing, chest pain, palpitations, orthopnea, edema, abdominal pain, nausea, melena, diarrhea, constipation, flank pain, dysuria, hematuria, urinary  Frequency, nocturia, numbness, tingling, seizures,  Focal weakness, Loss of consciousness,  Tremor, insomnia, depression, anxiety, and suicidal ideation.      Objective:  BP 132/64 (BP Location: Left Arm, Patient Position: Sitting, Cuff Size: Normal)   Pulse 60   Temp 98.2 F (36.8 C) (Oral)   Resp 16   Ht 5\' 3"  (1.6 m)   Wt 148 lb 12.8 oz (67.5 kg)   SpO2 97%   BMI 26.36 kg/m   BP Readings from Last 3 Encounters:  05/23/17 132/64  04/10/17 124/60  02/18/17 138/72    Wt Readings from Last 3 Encounters:  05/23/17 148 lb 12.8 oz (67.5 kg)  04/10/17 148 lb 1.9 oz (67.2 kg)  02/18/17 150 lb (68 kg)    General appearance: alert, cooperative and appears stated age Ears: normal TM's and external ear canals both ears Throat: lips, mucosa, and tongue normal; teeth and gums normal Neck: no adenopathy, no carotid bruit, supple, symmetrical, trachea midline and thyroid not enlarged, symmetric, no tenderness/mass/nodules Back: symmetric, no curvature. ROM normal. No CVA tenderness. Lungs: clear to auscultation bilaterally Heart: regular rate and rhythm, S1, S2 normal, no murmur, click,  rub or gallop Abdomen: soft, non-tender; bowel sounds normal; no masses,  no organomegaly Pulses: 2+ and symmetric Skin: Skin color, texture, turgor normal. No rashes or lesions Lymph nodes: Cervical, supraclavicular, and axillary nodes normal.  Lab Results  Component Value Date   HGBA1C 7.0 (H) 05/21/2017   HGBA1C 7.2 (H) 02/18/2017   HGBA1C 6.9 10/19/2016    Lab Results  Component Value Date   CREATININE 0.77 05/21/2017   CREATININE 0.69 02/18/2017   CREATININE 0.76 10/19/2016    Lab Results  Component Value Date   WBC 7.1 03/18/2014   HGB 13.3 03/18/2014   HCT 40.0 03/18/2014   PLT 183.0 03/18/2014   GLUCOSE 180 (H) 05/21/2017   CHOL 150 05/21/2017   TRIG 250.0 (H) 05/21/2017   HDL 55.70 05/21/2017   LDLDIRECT 72.0 05/21/2017   LDLCALC 60 02/18/2017   ALT 13 05/21/2017   AST 13 05/21/2017   NA 139 05/21/2017   K 4.6 05/21/2017   CL 103 05/21/2017   CREATININE 0.77 05/21/2017   BUN 24 (H) 05/21/2017   CO2 28 05/21/2017   TSH 1.75 05/21/2017   HGBA1C 7.0 (H) 05/21/2017   MICROALBUR 1.3 02/18/2017    Mm Screening Breast Tomo Bilateral  Result Date: 08/16/2016 CLINICAL DATA:  Screening. EXAM: 2D DIGITAL SCREENING BILATERAL MAMMOGRAM WITH CAD AND ADJUNCT TOMO COMPARISON:  Previous exam(s). ACR Breast Density Category c: The breast tissue is heterogeneously dense, which may obscure small masses. FINDINGS: There are no findings suspicious for malignancy. Images were processed with CAD. IMPRESSION: No mammographic evidence of malignancy. A result letter of this screening mammogram will be mailed directly to the patient. RECOMMENDATION: Screening mammogram in one year. (Code:SM-B-01Y) BI-RADS CATEGORY  1: Negative. Electronically Signed   By: Pamelia Hoit M.D.   On: 08/16/2016 07:19    Assessment & Plan:   Problem List Items Addressed This Visit    DM type 2, controlled, with complication ([place])    Improved control with increase of glipizide to 10 mg bid  today. She is  reminded to schedule an annual eye exam and foot exam is normal today. Patient has minimal  microalbuminuria. Patient is tolerating aspirin and statin therapy for CAD risk reduction and on ARB for renal protection and hypertension   Lab Results  Component Value Date   HGBA1C 7.0 (H) 05/21/2017   Lab Results  Component Value Date   MICROALBUR 1.3 02/18/2017   Lab Results  Component Value Date   CHOL 150 05/21/2017   HDL 55.70 05/21/2017   LDLCALC 60 02/18/2017   LDLDIRECT 72.0 05/21/2017   TRIG 250.0 (H) 05/21/2017   CHOLHDL 3 05/21/2017              Eczema of hand   Essential hypertension    Well controlled on current regimen. Renal function stable, no changes today.  Lab Results  Component Value Date   NA 139 05/21/2017   K 4.6 05/21/2017  CL 103 05/21/2017   CO2 28 05/21/2017   Lab Results  Component Value Date   CREATININE 0.77 05/21/2017         Hyperlipidemia associated with type 2 diabetes mellitus ([place])    Well controlled currently on high potency statin. LDL is < 70 on lipitor 20 mg . liver enzymes are normal,  No changes today   Lab Results  Component Value Date   CHOL 150 05/21/2017   HDL 55.70 05/21/2017   LDLCALC 60 02/18/2017   LDLDIRECT 72.0 05/21/2017   TRIG 250.0 (H) 05/21/2017   CHOLHDL 3 05/21/2017   Lab Results  Component Value Date   ALT 13 05/21/2017   AST 13 05/21/2017   ALKPHOS 110 05/21/2017   BILITOT 0.8 05/21/2017                        Hypothyroidism    Thyroid function is WNL on current dose.  No current changes needed.   Lab Results  Component Value Date   TSH 1.75 05/21/2017         Osteoporosis, post-menopausal    Bone Density scores received, she has bone loss which is significant  I recommend treating with medication but would need to discuss the pros and cons in an office visit continue calcium 1200 to 1800 mg daily through diet and supplements ,  2000 units of vitamin d and weight bearing  exercise on a regular basis, and make appt to discuss        A total of 25 minutes of face to face time was spent with patient more than half of which was spent in counselling about the above mentioned conditions  and coordination of care  I am having Ms. Paolo maintain her calcium-vitamin D, aspirin, glucose blood, GE100 BLOOD GLUCOSE TEST, levothyroxine, losartan, ALPRAZolam, glipiZIDE, atorvastatin, and ONE TOUCH ULTRA TEST.  No orders of the defined types were placed in this encounter.   There are no discontinued medications.  Follow-up: Return in about 3 months (around 08/22/2017) for follow up diabetes.   Crecencio Mc, MD

## 2017-05-25 NOTE — Assessment & Plan Note (Signed)
Well controlled currently on high potency statin. LDL is < 70 on lipitor 20 mg . liver enzymes are normal,  No changes today   Lab Results  Component Value Date   CHOL 150 05/21/2017   HDL 55.70 05/21/2017   LDLCALC 60 02/18/2017   LDLDIRECT 72.0 05/21/2017   TRIG 250.0 (H) 05/21/2017   CHOLHDL 3 05/21/2017   Lab Results  Component Value Date   ALT 13 05/21/2017   AST 13 05/21/2017   ALKPHOS 110 05/21/2017   BILITOT 0.8 05/21/2017

## 2017-05-25 NOTE — Assessment & Plan Note (Signed)
Bone Density scores received, she has bone loss which is significant  I recommend treating with medication but would need to discuss the pros and cons in an office visit continue calcium 1200 to 1800 mg daily through diet and supplements ,  2000 units of vitamin d and weight bearing exercise on a regular basis, and make appt to discuss

## 2017-05-25 NOTE — Assessment & Plan Note (Signed)
Well controlled on current regimen. Renal function stable, no changes today.  Lab Results  Component Value Date   NA 139 05/21/2017   K 4.6 05/21/2017   CL 103 05/21/2017   CO2 28 05/21/2017   Lab Results  Component Value Date   CREATININE 0.77 05/21/2017

## 2017-05-25 NOTE — Assessment & Plan Note (Signed)
Thyroid function is WNL on current dose.  No current changes needed.   Lab Results  Component Value Date   TSH 1.75 05/21/2017

## 2017-05-25 NOTE — Assessment & Plan Note (Signed)
Improved control with increase of glipizide to 10 mg bid  today. She is reminded to schedule an annual eye exam and foot exam is normal today. Patient has minimal  microalbuminuria. Patient is tolerating aspirin and statin therapy for CAD risk reduction and on ARB for renal protection and hypertension   Lab Results  Component Value Date   HGBA1C 7.0 (H) 05/21/2017   Lab Results  Component Value Date   MICROALBUR 1.3 02/18/2017   Lab Results  Component Value Date   CHOL 150 05/21/2017   HDL 55.70 05/21/2017   LDLCALC 60 02/18/2017   LDLDIRECT 72.0 05/21/2017   TRIG 250.0 (H) 05/21/2017   CHOLHDL 3 05/21/2017

## 2017-05-27 ENCOUNTER — Telehealth: Payer: Self-pay | Admitting: *Deleted

## 2017-05-27 DIAGNOSIS — L0291 Cutaneous abscess, unspecified: Secondary | ICD-10-CM | POA: Diagnosis not present

## 2017-05-27 DIAGNOSIS — L400 Psoriasis vulgaris: Secondary | ICD-10-CM | POA: Diagnosis not present

## 2017-05-27 NOTE — Telephone Encounter (Signed)
See result note message 

## 2017-05-27 NOTE — Telephone Encounter (Signed)
Pt requested bone density results  Pt contact [phone]

## 2017-05-30 ENCOUNTER — Other Ambulatory Visit: Payer: Self-pay | Admitting: Internal Medicine

## 2017-05-30 ENCOUNTER — Other Ambulatory Visit: Payer: Self-pay

## 2017-05-30 MED ORDER — LOSARTAN POTASSIUM 50 MG PO TABS: 50.0000 mg | ORAL_TABLET | Freq: Every day | ORAL | 1 refills | Status: DC

## 2017-06-24 ENCOUNTER — Other Ambulatory Visit: Payer: Self-pay | Admitting: Internal Medicine

## 2017-06-24 DIAGNOSIS — Z1231 Encounter for screening mammogram for malignant neoplasm of breast: Secondary | ICD-10-CM

## 2017-07-16 ENCOUNTER — Other Ambulatory Visit: Payer: Self-pay | Admitting: Internal Medicine

## 2017-07-18 ENCOUNTER — Other Ambulatory Visit: Payer: Self-pay | Admitting: Internal Medicine

## 2017-08-06 ENCOUNTER — Telehealth: Payer: Self-pay | Admitting: Internal Medicine

## 2017-08-06 NOTE — Telephone Encounter (Signed)
Copied from [place] [id number]. Topic: Quick Communication - See Telephone Encounter >> Aug 06, 2017  2:12 PM Percell Belt A wrote: CRM for notification. See Telephone encounter for: pt daughter ( page) called in said that pt has fever blister on bottom lip and it Is really swollen.  Pt is suppose to come in in a few week but she did not want to bring her in for this.  Can something be called in for her fever blister  Pharm[facility]- [facility] on file    08/06/17.

## 2017-08-07 ENCOUNTER — Ambulatory Visit: Payer: Self-pay | Admitting: *Deleted

## 2017-08-07 NOTE — Telephone Encounter (Signed)
Pt  Has  Cold  Sores  On  Bottom  Lip    X  4  Days  Pt  Reports  She   Has  Been  Under  Stress  Lately  . She  As  Advised  To  Use  otc  orajel  Or  Similar  meds  .  To  aviod   spicey   Or  Acidic  Foods  followup  If  Worse   Reason for Disposition . Mild lip swelling of unknown cause  Answer Assessment - Initial Assessment Questions 1. ONSET: "When did the swelling start?" (e.g., minutes, hours, days)       4  Days    2. SEVERITY: "How swollen is it?"      Swelling   Like  A  Fat  Lip     3. ITCHING: "Is there any itching?" If so, ask: "How much?"   (Scale 1-10; mild, moderate or severe)    NO  4. PAIN: "Is the swelling painful to touch?" If so, ask: "How painful is it?"   (Scale 1-10; mild, moderate or severe)     1  5. CAUSE: "What do you think is causing the lip swelling?"      Sores   On   Lower   Across    6. RECURRENT SYMPTOM: "Have you had lip swelling before?" If so, ask: "When was the last time?" "What happened that time?"       6  Months  ago 7. OTHER SYMPTOMS: "Do you have any other symptoms?" (e.g., toothache)        no 8. PREGNANCY: "Is there any chance you are pregnant?" "When was your last menstrual period?"     no  Protocols used: L[facility] [place]

## 2017-08-07 NOTE — Telephone Encounter (Signed)
Attempted to call pt's daughter @ [phone]/ left vm to call back; called [phone] and reached pt's daughter.  She requested I contact the pt. at [phone].

## 2017-08-19 ENCOUNTER — Ambulatory Visit
Admission: RE | Admit: 2017-08-19 | Discharge: 2017-08-19 | Disposition: A | Discharge status: Home / Self Care | Payer: PPO | Source: Ambulatory Visit | Attending: Internal Medicine | Admitting: Internal Medicine

## 2017-08-19 DIAGNOSIS — Z1231 Encounter for screening mammogram for malignant neoplasm of breast: Secondary | ICD-10-CM | POA: Diagnosis present

## 2017-08-23 ENCOUNTER — Ambulatory Visit: Payer: PPO | Admitting: Internal Medicine

## 2017-08-23 ENCOUNTER — Encounter: Payer: Self-pay | Admitting: Internal Medicine

## 2017-08-23 VITALS — BP 126/70 | HR 75 | Temp 98.3°F | Resp 14 | Ht 63.0 in | Wt 151.4 lb

## 2017-08-23 DIAGNOSIS — I10 Essential (primary) hypertension: Secondary | ICD-10-CM

## 2017-08-23 DIAGNOSIS — E034 Atrophy of thyroid (acquired): Secondary | ICD-10-CM | POA: Diagnosis not present

## 2017-08-23 DIAGNOSIS — E118 Type 2 diabetes mellitus with unspecified complications: Secondary | ICD-10-CM

## 2017-08-23 LAB — POCT GLYCOSYLATED HEMOGLOBIN (HGB A1C): Hemoglobin A1C: 7.1

## 2017-08-23 MED ORDER — SITAGLIPTIN PHOSPHATE 50 MG PO TABS: 50.0000 mg | ORAL_TABLET | Freq: Every day | ORAL | 3 refills | Status: DC
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
## 2017-08-23 NOTE — Patient Instructions (Addendum)
We are adding Januvia 50 mg daily  to your diabetes regimen .  Continue your glipizide twice daily  Start with a 15 minute walk every day to help lower your blood sugars   If your blood sugars start to drop below 90  ,  Reduce the glipizide dose to 5 mg twice daily and LET ME KNOW   i'll see you in 3 months

## 2017-08-25 NOTE — Assessment & Plan Note (Addendum)
Remains under good  control with increase of glipizide to 10 mg bid, but adding januvia 50 mg daily today .  She will reduce glipizide dose to 5 mg bid if BS drop below 90.  .  She is up to date on annual eye exam and foot exam is normal today. Patient has minimal  microalbuminuria. Patient is tolerating aspirin and statin therapy for CAD risk reduction and on ARB for renal protection and hypertension   Lab Results  Component Value Date   HGBA1C 7.1 08/23/2017   Lab Results  Component Value Date   MICROALBUR 1.3 02/18/2017   Lab Results  Component Value Date   CHOL 150 05/21/2017   HDL 55.70 05/21/2017   LDLCALC 60 02/18/2017   LDLDIRECT 72.0 05/21/2017   TRIG 250.0 (H) 05/21/2017   CHOLHDL 3 05/21/2017

## 2017-08-25 NOTE — Assessment & Plan Note (Signed)
Well controlled on current regimen. Renal function stable, no changes today.  Lab Results  Component Value Date   CREATININE 0.77 05/21/2017   Lab Results  Component Value Date   NA 139 05/21/2017   K 4.6 05/21/2017   CL 103 05/21/2017   CO2 28 05/21/2017

## 2017-09-03 DIAGNOSIS — R195 Other fecal abnormalities: Secondary | ICD-10-CM

## 2017-09-03 HISTORY — DX: Other fecal abnormalities: R19.5

## 2017-09-04 NOTE — Progress Notes (Signed)
cologaurd order has been faxed.  

## 2017-09-18 DIAGNOSIS — Z1212 Encounter for screening for malignant neoplasm of rectum: Secondary | ICD-10-CM | POA: Diagnosis not present

## 2017-09-18 DIAGNOSIS — Z1211 Encounter for screening for malignant neoplasm of colon: Secondary | ICD-10-CM | POA: Diagnosis not present

## 2017-09-20 ENCOUNTER — Other Ambulatory Visit: Payer: Self-pay | Admitting: Internal Medicine

## 2017-09-23 ENCOUNTER — Other Ambulatory Visit: Payer: Self-pay | Admitting: Internal Medicine

## 2017-09-23 ENCOUNTER — Telehealth: Payer: Self-pay | Admitting: Internal Medicine

## 2017-09-23 MED ORDER — GLUCOSE BLOOD VI STRP: ORAL_STRIP | 1 refills | Status: DC

## 2017-09-23 NOTE — Telephone Encounter (Signed)
Copied from [place]. Topic: Quick Communication - Rx Refill/Question >> Sep 23, 2017 12:04 PM Arletha Grippe wrote: Medication: ONE TOUCH ULTRA TEST test strip   Has the patient contacted their pharmacy? Yes.     (Agent: If no, request that the patient contact the pharmacy for the refill.)   Preferred Pharmacy (with phone number or street name): [facility] - they need to know how many times per day the pt needs to check their glucose? The insurance will not accept use as directed as a valid sig code.  [phone]    Agent: Please be advised that RX refills may take up to 3 business days. We ask that you follow-up with your pharmacy.

## 2017-09-23 NOTE — Telephone Encounter (Signed)
Frequency  Per  Day   One  Touch  Ultra test  Strip    Clarification  From  Pharmacy

## 2017-09-28 LAB — COLOGUARD: Cologuard: POSITIVE

## 2017-10-02 ENCOUNTER — Telehealth: Payer: Self-pay | Admitting: Internal Medicine

## 2017-10-02 ENCOUNTER — Encounter: Payer: Self-pay | Admitting: Internal Medicine

## 2017-10-02 DIAGNOSIS — R195 Other fecal abnormalities: Secondary | ICD-10-CM

## 2017-10-02 NOTE — Telephone Encounter (Signed)
The results of patient's cologuard is  Positive. This may indicate a polyp somewhere in the colon.  I  would like to refer her rto GI for further evaluation .    Is she willing to see on and does she have a preference?

## 2017-10-02 NOTE — Telephone Encounter (Signed)
LMTCB. PEC may speak with pt.  

## 2017-10-02 NOTE — Telephone Encounter (Signed)
Pt given lab results per notes of Dr. Derrel Nip on 1/30. Pt verbalized understanding. Pt is agreeable to see GI for further evaluation but would like to have a choice of the doctor before she is seen for an appt.

## 2017-10-03 NOTE — Telephone Encounter (Signed)
Pt would like to speak with [name] about her positive colon guard. And she is unsure where she needs to go for further testing. She doesn't personally know a gastro doc and she would like Dr. Derrel Nip to refer her to one.

## 2017-10-03 NOTE — Telephone Encounter (Signed)
Patient would like to be sent to Dr. Bary Castilla

## 2017-10-03 NOTE — Telephone Encounter (Signed)
I prefer dr byrnett here in [place],  He is part of [facility]  Not part of [facility],  He is [facility] ,  Otherwise  I would send her to [facility] in [place]

## 2017-10-03 NOTE — Telephone Encounter (Signed)
Spoke with pt and she stated that she doesn't know if any GI doctors. She stated that the last one she saw was at [facility] in [place]. Pt would like to know if you prefer [facility] here in [place] or [facility] in [place]. She stated which ever you prefer is where she would like to go.

## 2017-10-09 ENCOUNTER — Encounter: Payer: Self-pay | Admitting: *Deleted

## 2017-10-10 ENCOUNTER — Encounter: Payer: Self-pay | Admitting: General Surgery

## 2017-10-10 ENCOUNTER — Ambulatory Visit (INDEPENDENT_AMBULATORY_CARE_PROVIDER_SITE_OTHER): Payer: PPO | Admitting: General Surgery

## 2017-10-10 VITALS — BP 146/82 | HR 72 | Resp 14 | Ht 63.0 in | Wt 152.0 lb

## 2017-10-10 DIAGNOSIS — R195 Other fecal abnormalities: Secondary | ICD-10-CM

## 2017-10-10 MED ORDER — POLYETHYLENE GLYCOL 3350 17 GM/SCOOP PO POWD: ORAL | 0 refills | Status: DC

## 2017-10-10 NOTE — Patient Instructions (Signed)
Colonoscopy, Adult A colonoscopy is an exam to look at the entire large intestine. During the exam, a lubricated, bendable tube is inserted into the anus and then passed into the rectum, colon, and other parts of the large intestine. A colonoscopy is often done as a part of normal colorectal screening or in response to certain symptoms, such as anemia, persistent diarrhea, abdominal pain, and blood in the stool. The exam can help screen for and diagnose medical problems, including:  Tumors.  Polyps.  Inflammation.  Areas of bleeding.  Tell a health care provider about:  Any allergies you have.  All medicines you are taking, including vitamins, herbs, eye drops, creams, and over-the-counter medicines.  Any problems you or family members have had with anesthetic medicines.  Any blood disorders you have.  Any surgeries you have had.  Any medical conditions you have.  Any problems you have had passing stool. What are the risks? Generally, this is a safe procedure. However, problems may occur, including:  Bleeding.  A tear in the intestine.  A reaction to medicines given during the exam.  Infection (rare).  What happens before the procedure? Eating and drinking restrictions Follow instructions from your health care provider about eating and drinking, which may include:  A few days before the procedure - follow a low-fiber diet. Avoid nuts, seeds, dried fruit, raw fruits, and vegetables.  1-3 days before the procedure - follow a clear liquid diet. Drink only clear liquids, such as clear broth or bouillon, black coffee or tea, clear juice, clear soft drinks or sports drinks, gelatin dessert, and popsicles. Avoid any liquids that contain red or purple dye.  On the day of the procedure - do not eat or drink anything during the 2 hours before the procedure, or within the time period that your health care provider recommends.  Bowel prep If you were prescribed an oral bowel prep  to clean out your colon:  Take it as told by your health care provider. Starting the day before your procedure, you will need to drink a large amount of medicated liquid. The liquid will cause you to have multiple loose stools until your stool is almost clear or light green.  If your skin or anus gets irritated from diarrhea, you may use these to relieve the irritation: ? Medicated wipes, such as adult wet wipes with aloe and vitamin E. ? A skin soothing-product like petroleum jelly.  If you vomit while drinking the bowel prep, take a break for up to 60 minutes and then begin the bowel prep again. If vomiting continues and you cannot take the bowel prep without vomiting, call your health care provider.  General instructions  Ask your health care provider about changing or stopping your regular medicines. This is especially important if you are taking diabetes medicines or blood thinners.  Plan to have someone take you home from the hospital or clinic. What happens during the procedure?  An IV tube may be inserted into one of your veins.  You will be given medicine to help you relax (sedative).  To reduce your risk of infection: ? Your health care team will wash or sanitize their hands. ? Your anal area will be washed with soap.  You will be asked to lie on your side with your knees bent.  Your health care provider will lubricate a long, thin, flexible tube. The tube will have a camera and a light on the end.  The tube will be inserted into your   anus.  The tube will be gently eased through your rectum and colon.  Air will be delivered into your colon to keep it open. You may feel some pressure or cramping.  The camera will be used to take images during the procedure.  A small tissue sample may be removed from your body to be examined under a microscope (biopsy). If any potential problems are found, the tissue will be sent to a lab for testing.  If small polyps are found, your  health care provider may remove them and have them checked for cancer cells.  The tube that was inserted into your anus will be slowly removed. The procedure may vary among health care providers and hospitals. What happens after the procedure?  Your blood pressure, heart rate, breathing rate, and blood oxygen level will be monitored until the medicines you were given have worn off.  Do not drive for 24 hours after the exam.  You may have a small amount of blood in your stool.  You may pass gas and have mild abdominal cramping or bloating due to the air that was used to inflate your colon during the exam.  It is up to you to get the results of your procedure. Ask your health care provider, or the department performing the procedure, when your results will be ready. This information is not intended to replace advice given to you by your health care provider. Make sure you discuss any questions you have with your health care provider. Document Released: 08/17/2000 Document Revised: 06/20/2016 Document Reviewed: 11/01/2015 Elsevier Interactive Patient Education  2018 Elsevier Inc.  

## 2017-10-10 NOTE — Progress Notes (Signed)
Patient ID: [name], female   DOB: 1941/04/27, 77 y.o.   MRN: [id number]  Chief Complaint  Patient presents with  . Colonoscopy    HPI [name] is a 77 y.o. female here today to discuss her positive cologaurd done on 09-28-17 referred by Dr Derrel Nip. She states her bowels move daily, no bleeding.  HPI  Past Medical History:  Diagnosis Date  . Diabetes mellitus   . GERD (gastroesophageal reflux disease)   . Hyperlipidemia   . Hypertension   . Osteoporosis   . Positive colorectal cancer screening using Cologuard test 2019  . Thyroid disease     Past Surgical History:  Procedure Laterality Date  . BREAST CYST ASPIRATION Bilateral 25+ yrs ago  . CATARACT EXTRACTION W/PHACO Right 03/02/2015   Procedure: CATARACT EXTRACTION PHACO AND INTRAOCULAR LENS PLACEMENT ([place]) TORIC LENS;  Surgeon: Leandrew Koyanagi, MD;  Location: [facility];  Service: Ophthalmology;  Laterality: Right;  TORIC  . CATARACT EXTRACTION W/PHACO Left 03/30/2015   Procedure: CATARACT EXTRACTION PHACO AND INTRAOCULAR LENS PLACEMENT ([facility]);  Surgeon: Leandrew Koyanagi, MD;  Location: [facility];  Service: Ophthalmology;  Laterality: Left;  TORIC  DIABETIC - oral meds  . CERVICAL POLYPECTOMY    . COLONOSCOPY  01/2007   Dr Olevia Perches  . HUMERUS FRACTURE SURGERY    . VAGINAL DELIVERY    . WRIST FRACTURE SURGERY      Family History  Problem Relation Age of Onset  . Hypertension Mother   . Stroke Mother 6       hemorrhagic  . Heart disease Father   . Cancer Father        Lung CA,  died of AMI while in [place] getting Laetril  . Diabetes Brother   . Hypertension Brother   . Hypertension Son   . Heart disease Maternal Grandfather   . Cancer Maternal Grandfather   . Breast cancer Maternal Grandmother 86  . Depression Sister     Social History Social History   Tobacco Use  . Smoking status: Never Smoker  . Smokeless tobacco: Never Used  Substance Use Topics  . Alcohol use: No  .  Drug use: No    Allergies  Allergen Reactions  . Latex Rash    Current Outpatient Medications  Medication Sig Dispense Refill  . ALPRAZolam (XANAX) 0.25 MG tablet Take 1 tablet (0.25 mg total) by mouth every 6 (six) hours as needed for anxiety. 30 tablet 3  . aspirin 81 MG tablet Take 81 mg by mouth daily.    Marland Kitchen atorvastatin (LIPITOR) 20 MG tablet TAKE ONE TABLET EVERYDAY 90 tablet 1  . calcium-vitamin D (OSCAL WITH D) 500-200 MG-UNIT per tablet Take 2 tablets by mouth daily. Reported on 03/21/2016    . GE100 BLOOD GLUCOSE TEST test strip CHECK TWICE A DAY 100 each 1  . glipiZIDE (GLUCOTROL) 10 MG tablet TAKE ONE TABLET BY MOUTH TWICE DAILY BEFORE A MEAL (Patient taking differently: TAKE ONE half TABLET BY MOUTH TWICE DAILY BEFORE A MEAL) 60 tablet 5  . glucose blood (ONE TOUCH ULTRA TEST) test strip USE AS DIRECTED 100 each 1  . glucose blood test strip Check sugars twice daily,ascensia contour micro fill test strips, Dx 250.00 100 each 5  . levothyroxine (SYNTHROID, LEVOTHROID) 88 MCG tablet TAKE ONE TABLET EVERY DAY BEFORE BREAKFAST 90 tablet 1  . losartan (COZAAR) 50 MG tablet TAKE ONE TABLET BY MOUTH DAILY 90 tablet 1  . ONE TOUCH ULTRA TEST test strip  USE AS DIRECTED 100 each 1  . sitaGLIPtin (JANUVIA) 50 MG tablet Take 1 tablet (50 mg total) by mouth daily. 30 tablet 3  . polyethylene glycol powder (GLYCOLAX/MIRALAX) powder 255 grams one bottle for colonoscopy prep 255 g 0   No current facility-administered medications for this visit.     Review of Systems Review of Systems  Constitutional: Negative.   Respiratory: Negative.   Cardiovascular: Negative.   Gastrointestinal: Negative for blood in stool, constipation, diarrhea and nausea.    Blood pressure (!) 146/82, pulse 72, resp. rate 14, height 5\' 3"  (1.6 m), weight 152 lb (68.9 kg).  Physical Exam Physical Exam  Constitutional: She is oriented to person, place, and time. She appears well-developed and well-nourished.   HENT:  Mouth/Throat: Oropharynx is clear and moist.  Eyes: Conjunctivae are normal. No scleral icterus.  Neck: Neck supple.  Cardiovascular: Normal rate, regular rhythm and normal heart sounds.  Pulmonary/Chest: Effort normal and breath sounds normal.  Lymphadenopathy:    She has no cervical adenopathy.  Neurological: She is alert and oriented to person, place, and time.  Skin: Skin is warm and dry.  Psychiatric: Her behavior is normal.    Data Reviewed September 28, 2017 Cologuard test: Positive.  May 21, 2017 conference of metabolic panel showed a creatinine of 0.77 with an estimated GFR greater than 60.  Blood sugar at 180.  Normal electrolytes.  Assessment    Positive Colo guard test.    Plan    Hold glipizide day of prep and procedure  Colonoscopy with possible biopsy/polypectomy prn: Information regarding the procedure, including its potential risks and complications (including but not limited to perforation of the bowel, which may require emergency surgery to repair, and bleeding) was verbally given to the patient. Educational information regarding lower intestinal endoscopy was given to the patient. Written instructions for how to complete the bowel prep using Miralax were provided. The importance of drinking ample fluids to avoid dehydration as a result of the prep emphasized.      HPI, Physical Exam, Assessment and Plan have been scribed under the direction and in the presence of Robert Bellow, MD. Karie Fetch, RN  I have completed the exam and reviewed the above documentation for accuracy and completeness.  I agree with the above.  Haematologist has been used and any errors in dictation or transcription are unintentional.  Hervey Ard, M.D., F.A.C.S.  Forest Gleason Byrnett 10/11/2017, 2:59 PM   Patient has been scheduled for a colonoscopy on 10-23-17 at [facility]. Miralax prescription has been sent in to the patient's pharmacy today. This patient has been  asked to hold glipizide day of colonoscopy prep and procedure. Colonoscopy instructions have been reviewed with the patient. This patient is aware to call the office if they have further questions.   Dominga Ferry, CMA

## 2017-10-11 DIAGNOSIS — R195 Other fecal abnormalities: Secondary | ICD-10-CM | POA: Insufficient documentation

## 2017-10-11 DIAGNOSIS — D126 Benign neoplasm of colon, unspecified: Secondary | ICD-10-CM | POA: Insufficient documentation

## 2017-10-15 ENCOUNTER — Other Ambulatory Visit: Payer: Self-pay | Admitting: Internal Medicine

## 2017-10-22 ENCOUNTER — Other Ambulatory Visit: Payer: Self-pay | Admitting: Internal Medicine

## 2017-10-22 NOTE — Telephone Encounter (Signed)
Refilled: 10/19/2016 Last OV: 08/23/2017 Next OV:  11/21/2017

## 2017-10-23 ENCOUNTER — Other Ambulatory Visit: Payer: Self-pay

## 2017-10-23 ENCOUNTER — Encounter: Payer: Self-pay | Admitting: Certified Registered Nurse Anesthetist

## 2017-10-23 ENCOUNTER — Encounter
Admission: RE | Disposition: A | Discharge status: Home / Self Care | Payer: Self-pay | Source: Ambulatory Visit | Attending: General Surgery

## 2017-10-23 ENCOUNTER — Ambulatory Visit: Payer: PPO | Admitting: Certified Registered Nurse Anesthetist

## 2017-10-23 ENCOUNTER — Ambulatory Visit
Admission: RE | Admit: 2017-10-23 | Discharge: 2017-10-23 | Disposition: A | Discharge status: Home / Self Care | Payer: PPO | Source: Ambulatory Visit | Attending: General Surgery | Admitting: General Surgery

## 2017-10-23 DIAGNOSIS — Z803 Family history of malignant neoplasm of breast: Secondary | ICD-10-CM | POA: Diagnosis not present

## 2017-10-23 DIAGNOSIS — E785 Hyperlipidemia, unspecified: Secondary | ICD-10-CM | POA: Diagnosis not present

## 2017-10-23 DIAGNOSIS — Z79899 Other long term (current) drug therapy: Secondary | ICD-10-CM | POA: Diagnosis not present

## 2017-10-23 DIAGNOSIS — Z823 Family history of stroke: Secondary | ICD-10-CM | POA: Diagnosis not present

## 2017-10-23 DIAGNOSIS — K5790 Diverticulosis of intestine, part unspecified, without perforation or abscess without bleeding: Secondary | ICD-10-CM | POA: Diagnosis not present

## 2017-10-23 DIAGNOSIS — Z1211 Encounter for screening for malignant neoplasm of colon: Secondary | ICD-10-CM | POA: Diagnosis not present

## 2017-10-23 DIAGNOSIS — Z7984 Long term (current) use of oral hypoglycemic drugs: Secondary | ICD-10-CM | POA: Insufficient documentation

## 2017-10-23 DIAGNOSIS — Z9842 Cataract extraction status, left eye: Secondary | ICD-10-CM | POA: Insufficient documentation

## 2017-10-23 DIAGNOSIS — K219 Gastro-esophageal reflux disease without esophagitis: Secondary | ICD-10-CM | POA: Diagnosis not present

## 2017-10-23 DIAGNOSIS — I10 Essential (primary) hypertension: Secondary | ICD-10-CM | POA: Insufficient documentation

## 2017-10-23 DIAGNOSIS — E079 Disorder of thyroid, unspecified: Secondary | ICD-10-CM | POA: Diagnosis not present

## 2017-10-23 DIAGNOSIS — D123 Benign neoplasm of transverse colon: Secondary | ICD-10-CM | POA: Diagnosis not present

## 2017-10-23 DIAGNOSIS — Z801 Family history of malignant neoplasm of trachea, bronchus and lung: Secondary | ICD-10-CM | POA: Diagnosis not present

## 2017-10-23 DIAGNOSIS — Z8249 Family history of ischemic heart disease and other diseases of the circulatory system: Secondary | ICD-10-CM | POA: Diagnosis not present

## 2017-10-23 DIAGNOSIS — E119 Type 2 diabetes mellitus without complications: Secondary | ICD-10-CM | POA: Diagnosis not present

## 2017-10-23 DIAGNOSIS — Z9841 Cataract extraction status, right eye: Secondary | ICD-10-CM | POA: Insufficient documentation

## 2017-10-23 DIAGNOSIS — R195 Other fecal abnormalities: Secondary | ICD-10-CM

## 2017-10-23 DIAGNOSIS — Z818 Family history of other mental and behavioral disorders: Secondary | ICD-10-CM | POA: Diagnosis not present

## 2017-10-23 DIAGNOSIS — M810 Age-related osteoporosis without current pathological fracture: Secondary | ICD-10-CM | POA: Diagnosis not present

## 2017-10-23 DIAGNOSIS — K635 Polyp of colon: Secondary | ICD-10-CM | POA: Diagnosis not present

## 2017-10-23 DIAGNOSIS — K921 Melena: Secondary | ICD-10-CM | POA: Diagnosis present

## 2017-10-23 DIAGNOSIS — K5730 Diverticulosis of large intestine without perforation or abscess without bleeding: Secondary | ICD-10-CM | POA: Insufficient documentation

## 2017-10-23 DIAGNOSIS — Z7982 Long term (current) use of aspirin: Secondary | ICD-10-CM | POA: Diagnosis not present

## 2017-10-23 DIAGNOSIS — D125 Benign neoplasm of sigmoid colon: Secondary | ICD-10-CM | POA: Diagnosis not present

## 2017-10-23 HISTORY — PX: COLONOSCOPY WITH PROPOFOL: SHX5780

## 2017-10-23 LAB — GLUCOSE, CAPILLARY: Glucose-Capillary: 175 mg/dL — ABNORMAL HIGH (ref 65–99)

## 2017-10-23 SURGERY — COLONOSCOPY WITH PROPOFOL
Anesthesia: General

## 2017-10-23 MED ORDER — PROPOFOL 10 MG/ML IV BOLUS
INTRAVENOUS | Status: DC | PRN
  Administered 2017-10-23: 90 mg via INTRAVENOUS

## 2017-10-23 MED ORDER — LIDOCAINE HCL (CARDIAC) 20 MG/ML IV SOLN
INTRAVENOUS | Status: DC | PRN
  Administered 2017-10-23: 50 mg via INTRAVENOUS

## 2017-10-23 MED ORDER — LIDOCAINE HCL (PF) 2 % IJ SOLN
INTRAMUSCULAR | Status: AC
  Filled 2017-10-23: qty 10

## 2017-10-23 MED ORDER — PHENYLEPHRINE HCL 10 MG/ML IJ SOLN
INTRAMUSCULAR | Status: DC | PRN
  Administered 2017-10-23: 200 ug via INTRAVENOUS

## 2017-10-23 MED ORDER — PHENYLEPHRINE HCL 10 MG/ML IJ SOLN
INTRAMUSCULAR | Status: AC
  Filled 2017-10-23: qty 1

## 2017-10-23 MED ORDER — SODIUM CHLORIDE 0.9 % IV SOLN
INTRAVENOUS | Status: DC
  Administered 2017-10-23: 08:00:00 via INTRAVENOUS

## 2017-10-23 MED ORDER — PROPOFOL 500 MG/50ML IV EMUL
INTRAVENOUS | Status: AC
  Filled 2017-10-23: qty 50

## 2017-10-23 MED ORDER — PROPOFOL 500 MG/50ML IV EMUL
INTRAVENOUS | Status: DC | PRN
  Administered 2017-10-23: 140 ug/kg/min via INTRAVENOUS

## 2017-10-23 NOTE — Anesthesia Preprocedure Evaluation (Signed)
Anesthesia Evaluation  Patient identified by MRN, date of birth, ID band Patient awake    Reviewed: Allergy & Precautions, H&P , NPO status , reviewed documented beta blocker date and time   Airway Mallampati: II  TM Distance: >3 FB     Dental no notable dental hx.    Pulmonary    Pulmonary exam normal        Cardiovascular hypertension, Normal cardiovascular exam     Neuro/Psych    GI/Hepatic GERD  Controlled,  Endo/Other  diabetes, Type 2Hypothyroidism   Renal/GU      Musculoskeletal   Abdominal   Peds  Hematology   Anesthesia Other Findings   Reproductive/Obstetrics                             Anesthesia Physical Anesthesia Plan  ASA: II  Anesthesia Plan: General   Post-op Pain Management:    Induction:   PONV Risk Score and Plan: 3 and Propofol infusion  Airway Management Planned:   Additional Equipment:   Intra-op Plan:   Post-operative Plan:   Informed Consent: I have reviewed the patients History and Physical, chart, labs and discussed the procedure including the risks, benefits and alternatives for the proposed anesthesia with the patient or authorized representative who has indicated his/her understanding and acceptance.   Dental Advisory Given  Plan Discussed with: CRNA  Anesthesia Plan Comments:         Anesthesia Quick Evaluation

## 2017-10-23 NOTE — Anesthesia Postprocedure Evaluation (Signed)
Anesthesia Post Note  Patient: LENISHA LACAP  Procedure(s) Performed: COLONOSCOPY WITH PROPOFOL (N/A )  Patient location during evaluation: Endoscopy Anesthesia Type: General Level of consciousness: awake and alert Pain management: pain level controlled Vital Signs Assessment: post-procedure vital signs reviewed and stable Respiratory status: spontaneous breathing, nonlabored ventilation, respiratory function stable and patient connected to nasal cannula oxygen Cardiovascular status: blood pressure returned to baseline and stable Postop Assessment: no apparent nausea or vomiting Anesthetic complications: no     Last Vitals:  Vitals:   10/23/17 0855 10/23/17 0920  BP: (!) 89/62 (!) 115/98  Pulse:    Resp:    Temp:    SpO2:      Last Pain:  Vitals:   10/23/17 0900  TempSrc:   PainSc: 0-No pain                 Alphonsus Sias

## 2017-10-23 NOTE — Op Note (Signed)
[facility]astroenterology Patient Name: [name] Procedure Date: 10/23/2017 7:57 AM MRN: [id number] Account #: [id number] Date of Birth: [birth year] Admit Type: Outpatient Age: 77 Room: [facility] ENDO ROOM 1 Gender: Female Note Status: Finalized Procedure:            Colonoscopy Indications:          Positive Cologuard test Providers:            Robert Bellow, MD Referring MD:         Deborra Medina, MD (Referring MD) Medicines:            Monitored Anesthesia Care Complications:        No immediate complications. Procedure:            Pre-Anesthesia Assessment:                       - Prior to the procedure, a History and Physical was                        performed, and patient medications, allergies and                        sensitivities were reviewed. The patient's tolerance of                        previous anesthesia was reviewed.                       - The risks and benefits of the procedure and the                        sedation options and risks were discussed with the                        patient. All questions were answered and informed                        consent was obtained.                       After obtaining informed consent, the colonoscope was                        passed under direct vision. Throughout the procedure,                        the patient's blood pressure, pulse, and oxygen                        saturations were monitored continuously. The                        Colonoscope was introduced through the anus and                        advanced to the the terminal ileum. The colonoscopy was                        somewhat difficult due to multiple diverticula in the  colon. Successful completion of the procedure was aided                        by using manual pressure. The patient tolerated the                        procedure well. The quality of the bowel preparation                         was good. Findings:      Multiple medium-mouthed diverticula were found in the sigmoid colon and       descending colon.      A 12 mm polyp was found in the distal transverse colon. The polyp was       sessile. Biopsies were taken with a cold forceps for histology.      The terminal ileum appeared normal.      The retroflexed view of the distal rectum and anal verge was normal and       showed no anal or rectal abnormalities. Impression:           - Diverticulosis in the sigmoid colon and in the                        descending colon.                       - One 12 mm polyp in the distal transverse colon.                        Biopsied.                       - The examined portion of the ileum was normal.                       - The distal rectum and anal verge are normal on                        retroflexion view. Recommendation:       - Telephone endoscopist for pathology results in 1 week. Procedure Code(s):    --- Professional ---                       [phone], Colonoscopy, flexible; with biopsy, single or                        multiple Diagnosis Code(s):    --- Professional ---                       D12.3, Benign neoplasm of transverse colon (hepatic                        flexure or splenic flexure)                       R19.5, Other fecal abnormalities                       K57.30, Diverticulosis of large intestine without  perforation or abscess without bleeding CPT copyright 2016 American Medical Association. All rights reserved. The codes documented in this report are preliminary and upon coder review may  be revised to meet current compliance requirements. Robert Bellow, MD 10/23/2017 8:49:10 AM This report has been signed electronically. Number of Addenda: 0 Note Initiated On: 10/23/2017 7:57 AM Scope Withdrawal Time: 0 hours 20 minutes 57 seconds  Total Procedure Duration: 0 hours 34 minutes 14 seconds       [facility]

## 2017-10-23 NOTE — H&P (Signed)
No change in clinical history or exam. Tolerated prep well. For colonoscopy.  

## 2017-10-23 NOTE — Transfer of Care (Signed)
Immediate Anesthesia Transfer of Care Note  Patient: [name]  Procedure(s) Performed: COLONOSCOPY WITH PROPOFOL (N/A )  Patient Location: PACU and Endoscopy Unit  Anesthesia Type:General  Level of Consciousness: drowsy  Airway & Oxygen Therapy: Patient Spontanous Breathing and Patient connected to nasal cannula oxygen  Post-op Assessment: Report given to RN and Post -op Vital signs reviewed and stable  Post vital signs: Reviewed and stable  Last Vitals:  Vitals:   10/23/17 0746 10/23/17 0850  BP: (!) 144/66 (!) 85/50  Pulse: 84   Resp: 20   Temp: (!) 36.1 C (!) 36.2 C  SpO2: 99%     Last Pain:  Vitals:   10/23/17 0850  TempSrc: Tympanic  PainSc: Asleep      Patients Stated Pain Goal: 6 (28/11/88 6773)  Complications: No apparent anesthesia complications

## 2017-10-23 NOTE — Anesthesia Post-op Follow-up Note (Signed)
Anesthesia QCDR form completed.        

## 2017-10-24 ENCOUNTER — Encounter: Payer: Self-pay | Admitting: General Surgery

## 2017-10-24 LAB — SURGICAL PATHOLOGY

## 2017-10-24 NOTE — Telephone Encounter (Signed)
Printed, signed and faxed.  

## 2017-10-25 ENCOUNTER — Telehealth: Payer: Self-pay

## 2017-10-25 NOTE — Telephone Encounter (Signed)
Notified patient as instructed, patient pleased. Discussed follow-up appointments, patient agrees. Patient placed in recalls.   

## 2017-10-25 NOTE — Telephone Encounter (Signed)
-----   Message from Robert Bellow, MD sent at 10/25/2017  7:17 AM EST ----- Please notify the patient that the polyp was benign. She will need a f/u exam in three years. Thanks.

## 2017-11-18 ENCOUNTER — Other Ambulatory Visit (INDEPENDENT_AMBULATORY_CARE_PROVIDER_SITE_OTHER): Payer: PPO

## 2017-11-18 DIAGNOSIS — E118 Type 2 diabetes mellitus with unspecified complications: Secondary | ICD-10-CM

## 2017-11-18 DIAGNOSIS — E034 Atrophy of thyroid (acquired): Secondary | ICD-10-CM | POA: Diagnosis not present

## 2017-11-18 LAB — LIPID PANEL
Cholesterol: 146 mg/dL (ref 0–200)
HDL: 48.3 mg/dL
LDL Cholesterol: 63 mg/dL (ref 0–99)
NonHDL: 97.2
Total CHOL/HDL Ratio: 3
Triglycerides: 173 mg/dL — ABNORMAL HIGH (ref 0.0–149.0)
VLDL: 34.6 mg/dL (ref 0.0–40.0)

## 2017-11-18 LAB — COMPREHENSIVE METABOLIC PANEL WITH GFR
ALT: 12 U/L (ref 0–35)
AST: 11 U/L (ref 0–37)
Albumin: 4.4 g/dL (ref 3.5–5.2)
Alkaline Phosphatase: 103 U/L (ref 39–117)
BUN: 20 mg/dL (ref 6–23)
CO2: 28 meq/L (ref 19–32)
Calcium: 9.9 mg/dL (ref 8.4–10.5)
Chloride: 104 meq/L (ref 96–112)
Creatinine, Ser: 0.72 mg/dL (ref 0.40–1.20)
GFR: 83.67 mL/min
Glucose, Bld: 150 mg/dL — ABNORMAL HIGH (ref 70–99)
Potassium: 4.6 meq/L (ref 3.5–5.1)
Sodium: 140 meq/L (ref 135–145)
Total Bilirubin: 0.8 mg/dL (ref 0.2–1.2)
Total Protein: 7.3 g/dL (ref 6.0–8.3)

## 2017-11-18 LAB — HEMOGLOBIN A1C: Hgb A1c MFr Bld: 7 % — ABNORMAL HIGH (ref 4.6–6.5)

## 2017-11-18 LAB — TSH: TSH: 0.9 u[IU]/mL (ref 0.35–4.50)

## 2017-11-21 ENCOUNTER — Encounter: Payer: Self-pay | Admitting: Internal Medicine

## 2017-11-21 ENCOUNTER — Ambulatory Visit (INDEPENDENT_AMBULATORY_CARE_PROVIDER_SITE_OTHER): Payer: PPO

## 2017-11-21 ENCOUNTER — Ambulatory Visit: Payer: PPO | Admitting: Internal Medicine

## 2017-11-21 ENCOUNTER — Ambulatory Visit (INDEPENDENT_AMBULATORY_CARE_PROVIDER_SITE_OTHER): Payer: PPO | Admitting: Internal Medicine

## 2017-11-21 VITALS — BP 130/62 | HR 60 | Temp 97.7°F | Resp 15 | Ht 63.0 in | Wt 153.2 lb

## 2017-11-21 DIAGNOSIS — M25561 Pain in right knee: Secondary | ICD-10-CM

## 2017-11-21 DIAGNOSIS — E118 Type 2 diabetes mellitus with unspecified complications: Secondary | ICD-10-CM | POA: Diagnosis not present

## 2017-11-21 DIAGNOSIS — M21611 Bunion of right foot: Secondary | ICD-10-CM

## 2017-11-21 DIAGNOSIS — E1169 Type 2 diabetes mellitus with other specified complication: Secondary | ICD-10-CM

## 2017-11-21 DIAGNOSIS — E785 Hyperlipidemia, unspecified: Secondary | ICD-10-CM | POA: Diagnosis not present

## 2017-11-21 DIAGNOSIS — M2011 Hallux valgus (acquired), right foot: Secondary | ICD-10-CM

## 2017-11-21 DIAGNOSIS — G8929 Other chronic pain: Secondary | ICD-10-CM

## 2017-11-21 DIAGNOSIS — M1711 Unilateral primary osteoarthritis, right knee: Secondary | ICD-10-CM | POA: Diagnosis not present

## 2017-11-21 MED ORDER — GLIPIZIDE 10 MG PO TABS: 10.0000 mg | ORAL_TABLET | Freq: Two times a day (BID) | ORAL | 5 refills | Status: DC

## 2017-11-21 NOTE — Patient Instructions (Addendum)
Increase the glipizide dose to 10 mg twice daily .  If your blood sugars drop below 90 on this Regimen,   suspend the Januvia and submit  bood sugar readings after 2 weeks without the Januvia    I recommend seeing a podiatrist about your hammertoe and 2nd toe pain .  For nonsurgical management  [place] [facility] [address].    You can use Aleve twie daly for your knee pain ,  And I would ice it after you mow for at least 15 minutes

## 2017-11-21 NOTE — Progress Notes (Signed)
Subjective:  Patient ID: [name], female    DOB: October 10, 1940  Age: 77 y.o. MRN: [id number]  CC: The primary encounter diagnosis was Chronic pain of right knee. Diagnoses of Hyperlipidemia associated with type 2 diabetes mellitus ([place]), DM type 2, controlled, with complication ([place]), and Hallux abductovalgus with bunions, right were also pertinent to this visit.  HPI [name] presents for 3 month follow up on diabetes.  Patient has no complaints today.  Patient is following a low glycemic index diet and taking all prescribed medications regularly without side effects.  Fasting sugars have been under less than 140 most of the time and post prandials have been under 160 except on rare occasions. Patient is exercising about 3 times per week and intentionally trying to lose weight .  Patient has had an eye exam in the last 12 months and checks feet regularly for signs of infection.  Patient does not walk barefoot outside,  And denies an numbness tingling or burning in feet. Patient is up to date on all recommended vaccinations  Recent labs reviewed.  Recent colonoscopy done due to positive cologuard:  1.2 cm sessile adenomatous polyp found : 3 yr follow up advised    Having knee pain,  Aggravated by Lloyd Huger with weight  Not exercising due to rain and right knee pain   Bilateral bunions and hammertoe on right foot with 2nd toe prior fracture never healed correctly .  Discussed seeing Podiatry    Lab Results  Component Value Date   HGBA1C 7.0 (H) 11/18/2017     Outpatient Medications Prior to Visit  Medication Sig Dispense Refill  . ALPRAZolam (XANAX) 0.25 MG tablet TAKE ONE TABLET BY MOUTH EVERY 6 HOURS AS NEEDED FOR ANXIETY 30 tablet 5  . aspirin 81 MG tablet Take 81 mg by mouth daily.    Marland Kitchen atorvastatin (LIPITOR) 20 MG tablet TAKE 1 TABLET BY MOUTH DAILY 90 tablet 0  . calcium-vitamin D (OSCAL WITH D) 500-200 MG-UNIT per tablet Take 2 tablets by mouth daily.  Reported on 03/21/2016    . GE100 BLOOD GLUCOSE TEST test strip CHECK TWICE A DAY 100 each 1  . glucose blood (ONE TOUCH ULTRA TEST) test strip USE AS DIRECTED 100 each 1  . glucose blood test strip Check sugars twice daily,ascensia contour micro fill test strips, Dx 250.00 100 each 5  . levothyroxine (SYNTHROID, LEVOTHROID) 88 MCG tablet TAKE ONE TABLET EVERY DAY BEFORE BREAKFAST 90 tablet 1  . losartan (COZAAR) 50 MG tablet TAKE ONE TABLET BY MOUTH DAILY 90 tablet 1  . ONE TOUCH ULTRA TEST test strip USE AS DIRECTED 100 each 1  . glipiZIDE (GLUCOTROL) 10 MG tablet TAKE ONE TABLET BY MOUTH TWICE DAILY BEFORE A MEAL (Patient taking differently: TAKE ONE half TABLET BY MOUTH TWICE DAILY BEFORE A MEAL) 60 tablet 5  . sitaGLIPtin (JANUVIA) 50 MG tablet Take 1 tablet (50 mg total) by mouth daily. 30 tablet 3   No facility-administered medications prior to visit.     Review of Systems;  Patient denies headache, fevers, malaise, unintentional weight loss, skin rash, eye pain, sinus congestion and sinus pain, sore throat, dysphagia,  hemoptysis , cough, dyspnea, wheezing, chest pain, palpitations, orthopnea, edema, abdominal pain, nausea, melena, diarrhea, constipation, flank pain, dysuria, hematuria, urinary  Frequency, nocturia, numbness, tingling, seizures,  Focal weakness, Loss of consciousness,  Tremor, insomnia, depression, anxiety, and suicidal ideation.      Objective:  BP 130/62 (BP Location:  Left Arm, Patient Position: Sitting, Cuff Size: Normal)   Pulse 60   Temp 97.7 F (36.5 C) (Oral)   Resp 15   Ht 5\' 3"  (1.6 m)   Wt 153 lb 3.2 oz (69.5 kg)   SpO2 95%   BMI 27.14 kg/m   BP Readings from Last 3 Encounters:  11/21/17 130/62  10/23/17 (!) 115/98  10/10/17 (!) 146/82    Wt Readings from Last 3 Encounters:  11/21/17 153 lb 3.2 oz (69.5 kg)  10/23/17 152 lb (68.9 kg)  10/10/17 152 lb (68.9 kg)    General appearance: alert, cooperative and appears stated age Ears: normal  TM's and external ear canals both ears Throat: lips, mucosa, and tongue normal; teeth and gums normal Neck: no adenopathy, no carotid bruit, supple, symmetrical, trachea midline and thyroid not enlarged, symmetric, no tenderness/mass/nodules Back: symmetric, no curvature. ROM normal. No CVA tenderness. Lungs: clear to auscultation bilaterally Heart: regular rate and rhythm, S1, S2 normal, no murmur, click, rub or gallop Abdomen: soft, non-tender; bowel sounds normal; no masses,  no organomegaly Pulses: 2+ and symmetric Skin: Skin color, texture, turgor normal. No rashes or lesions Lymph nodes: Cervical, supraclavicular, and axillary nodes normal. MSK: right knee with medial tenderness and crepitus  Lab Results  Component Value Date   HGBA1C 7.0 (H) 11/18/2017   HGBA1C 7.1 08/23/2017   HGBA1C 7.0 (H) 05/21/2017    Lab Results  Component Value Date   CREATININE 0.72 11/18/2017   CREATININE 0.77 05/21/2017   CREATININE 0.69 02/18/2017    Lab Results  Component Value Date   WBC 7.1 03/18/2014   HGB 13.3 03/18/2014   HCT 40.0 03/18/2014   PLT 183.0 03/18/2014   GLUCOSE 150 (H) 11/18/2017   CHOL 146 11/18/2017   TRIG 173.0 (H) 11/18/2017   HDL 48.30 11/18/2017   LDLDIRECT 72.0 05/21/2017   LDLCALC 63 11/18/2017   ALT 12 11/18/2017   AST 11 11/18/2017   NA 140 11/18/2017   K 4.6 11/18/2017   CL 104 11/18/2017   CREATININE 0.72 11/18/2017   BUN 20 11/18/2017   CO2 28 11/18/2017   TSH 0.90 11/18/2017   HGBA1C 7.0 (H) 11/18/2017   MICROALBUR 1.3 02/18/2017    No results found.  Assessment & Plan:   Problem List Items Addressed This Visit    Knee pain, chronic - Primary    Degenerative changes on palin films today.  NSAIDs, ice recommended  steroid injection if no improvement       Relevant Orders   DG Knee Complete 4 Views Right (Completed)   Hyperlipidemia associated with type 2 diabetes mellitus ([place])    Well controlled currently on high potency statin. LDL is <  70 on lipitor 20 mg . liver enzymes are normal,  No changes today LDL and triglycerides are at goal on current medications. She has no side effects and liver enzymes are normal. No changes today   Lab Results  Component Value Date   CHOL 146 11/18/2017   HDL 48.30 11/18/2017   LDLCALC 63 11/18/2017   LDLDIRECT 72.0 05/21/2017   TRIG 173.0 (H) 11/18/2017   CHOLHDL 3 11/18/2017   Lab Results  Component Value Date   ALT 12 11/18/2017   AST 11 11/18/2017   ALKPHOS 103 11/18/2017   BILITOT 0.8 11/18/2017                        Relevant Medications   glipiZIDE (GLUCOTROL) 10 MG tablet  Hallux abductovalgus with bunions, right    She has deferred surgery but need podiatry referral for orthotics       DM type 2, controlled, with complication ([place])    Remains under good  control with glipizide to 5 mg bid, januvia 50 mg daily . She would like to resume full dose glipzide and stop the [place] due to cost..  She is up to date on annual eye exam and foot exam is normal today. Patient has minimal  microalbuminuria. Patient is tolerating aspirin and statin therapy for CAD risk reduction and on ARB for renal protection and hypertension   Lab Results  Component Value Date   HGBA1C 7.0 (H) 11/18/2017   Lab Results  Component Value Date   MICROALBUR 1.3 02/18/2017        Relevant Medications   glipiZIDE (GLUCOTROL) 10 MG tablet     A total of 25 minutes of face to face time was spent with patient more than half of which was spent in counselling and coordination of care    I have changed Ivin Booty B. Kothari's glipiZIDE. I am also having her maintain her calcium-vitamin D, aspirin, glucose blood, GE100 BLOOD GLUCOSE TEST, levothyroxine, losartan, glucose blood, ONE TOUCH ULTRA TEST, atorvastatin, and ALPRAZolam.  Meds ordered this encounter  Medications  . glipiZIDE (GLUCOTROL) 10 MG tablet    Sig: Take 1 tablet (10 mg total) by mouth 2 (two) times daily before a meal.     Dispense:  60 tablet    Refill:  5    Medications Discontinued During This Encounter  Medication Reason  . glipiZIDE (GLUCOTROL) 10 MG tablet     Follow-up: Return in about 3 months (around 02/21/2018) for follow up diabetes.   Crecencio Mc, MD

## 2017-11-22 ENCOUNTER — Other Ambulatory Visit: Payer: Self-pay | Admitting: Internal Medicine

## 2017-11-23 DIAGNOSIS — M21611 Bunion of right foot: Secondary | ICD-10-CM | POA: Insufficient documentation

## 2017-11-23 DIAGNOSIS — M25569 Pain in unspecified knee: Secondary | ICD-10-CM

## 2017-11-23 DIAGNOSIS — M2011 Hallux valgus (acquired), right foot: Secondary | ICD-10-CM | POA: Insufficient documentation

## 2017-11-23 DIAGNOSIS — G8929 Other chronic pain: Secondary | ICD-10-CM | POA: Insufficient documentation

## 2017-11-23 NOTE — Assessment & Plan Note (Signed)
Well controlled currently on high potency statin. LDL is < 70 on lipitor 20 mg . liver enzymes are normal,  No changes today LDL and triglycerides are at goal on current medications. She has no side effects and liver enzymes are normal. No changes today   Lab Results  Component Value Date   CHOL 146 11/18/2017   HDL 48.30 11/18/2017   LDLCALC 63 11/18/2017   LDLDIRECT 72.0 05/21/2017   TRIG 173.0 (H) 11/18/2017   CHOLHDL 3 11/18/2017   Lab Results  Component Value Date   ALT 12 11/18/2017   AST 11 11/18/2017   ALKPHOS 103 11/18/2017   BILITOT 0.8 11/18/2017

## 2017-11-23 NOTE — Assessment & Plan Note (Addendum)
Remains under good  control with glipizide to 5 mg bid, januvia 50 mg daily . She would like to resume full dose glipzide and stop the [place] due to cost..  She is up to date on annual eye exam and foot exam is normal today. Patient has minimal  microalbuminuria. Patient is tolerating aspirin and statin therapy for CAD risk reduction and on ARB for renal protection and hypertension   Lab Results  Component Value Date   HGBA1C 7.0 (H) 11/18/2017   Lab Results  Component Value Date   MICROALBUR 1.3 02/18/2017

## 2017-11-23 NOTE — Assessment & Plan Note (Signed)
Degenerative changes on palin films today.  NSAIDs, ice recommended  steroid injection if no improvement

## 2017-11-23 NOTE — Assessment & Plan Note (Addendum)
She has deferred surgery but need podiatry referral for orthotics

## 2017-11-25 ENCOUNTER — Encounter: Payer: Self-pay | Admitting: *Deleted

## 2018-01-28 ENCOUNTER — Telehealth: Payer: Self-pay

## 2018-01-28 ENCOUNTER — Other Ambulatory Visit: Payer: Self-pay | Admitting: Internal Medicine

## 2018-01-28 DIAGNOSIS — E785 Hyperlipidemia, unspecified: Secondary | ICD-10-CM

## 2018-01-28 DIAGNOSIS — E1169 Type 2 diabetes mellitus with other specified complication: Secondary | ICD-10-CM

## 2018-01-28 DIAGNOSIS — I10 Essential (primary) hypertension: Secondary | ICD-10-CM

## 2018-01-28 DIAGNOSIS — E118 Type 2 diabetes mellitus with unspecified complications: Secondary | ICD-10-CM

## 2018-01-28 NOTE — Telephone Encounter (Signed)
Labs ordered for lab appt.  

## 2018-02-06 DIAGNOSIS — E119 Type 2 diabetes mellitus without complications: Secondary | ICD-10-CM | POA: Diagnosis not present

## 2018-02-06 LAB — HM DIABETES EYE EXAM

## 2018-02-10 ENCOUNTER — Other Ambulatory Visit: Payer: Self-pay | Admitting: Internal Medicine

## 2018-02-19 ENCOUNTER — Other Ambulatory Visit (INDEPENDENT_AMBULATORY_CARE_PROVIDER_SITE_OTHER): Payer: PPO

## 2018-02-19 DIAGNOSIS — E1169 Type 2 diabetes mellitus with other specified complication: Secondary | ICD-10-CM

## 2018-02-19 DIAGNOSIS — E118 Type 2 diabetes mellitus with unspecified complications: Secondary | ICD-10-CM | POA: Diagnosis not present

## 2018-02-19 DIAGNOSIS — E785 Hyperlipidemia, unspecified: Secondary | ICD-10-CM | POA: Diagnosis not present

## 2018-02-19 DIAGNOSIS — I10 Essential (primary) hypertension: Secondary | ICD-10-CM

## 2018-02-19 LAB — COMPREHENSIVE METABOLIC PANEL WITH GFR
ALT: 14 U/L (ref 0–35)
AST: 13 U/L (ref 0–37)
Albumin: 4.4 g/dL (ref 3.5–5.2)
Alkaline Phosphatase: 96 U/L (ref 39–117)
BUN: 24 mg/dL — ABNORMAL HIGH (ref 6–23)
CO2: 31 meq/L (ref 19–32)
Calcium: 10 mg/dL (ref 8.4–10.5)
Chloride: 104 meq/L (ref 96–112)
Creatinine, Ser: 0.71 mg/dL (ref 0.40–1.20)
GFR: 84.98 mL/min
Glucose, Bld: 105 mg/dL — ABNORMAL HIGH (ref 70–99)
Potassium: 4.9 meq/L (ref 3.5–5.1)
Sodium: 140 meq/L (ref 135–145)
Total Bilirubin: 0.8 mg/dL (ref 0.2–1.2)
Total Protein: 7 g/dL (ref 6.0–8.3)

## 2018-02-19 LAB — LIPID PANEL
Cholesterol: 158 mg/dL (ref 0–200)
HDL: 44.5 mg/dL
NonHDL: 113.17
Total CHOL/HDL Ratio: 4
Triglycerides: 236 mg/dL — ABNORMAL HIGH (ref 0.0–149.0)
VLDL: 47.2 mg/dL — ABNORMAL HIGH (ref 0.0–40.0)

## 2018-02-19 LAB — LDL CHOLESTEROL, DIRECT: Direct LDL: 87 mg/dL

## 2018-02-19 LAB — HEMOGLOBIN A1C: Hgb A1c MFr Bld: 6.6 % — ABNORMAL HIGH (ref 4.6–6.5)

## 2018-02-21 ENCOUNTER — Encounter: Payer: Self-pay | Admitting: Internal Medicine

## 2018-02-21 ENCOUNTER — Ambulatory Visit (INDEPENDENT_AMBULATORY_CARE_PROVIDER_SITE_OTHER): Payer: PPO | Admitting: Internal Medicine

## 2018-02-21 VITALS — BP 130/68 | HR 58 | Temp 98.4°F | Resp 15 | Ht 63.0 in | Wt 150.0 lb

## 2018-02-21 DIAGNOSIS — E1169 Type 2 diabetes mellitus with other specified complication: Secondary | ICD-10-CM | POA: Diagnosis not present

## 2018-02-21 DIAGNOSIS — E118 Type 2 diabetes mellitus with unspecified complications: Secondary | ICD-10-CM | POA: Diagnosis not present

## 2018-02-21 DIAGNOSIS — E034 Atrophy of thyroid (acquired): Secondary | ICD-10-CM | POA: Diagnosis not present

## 2018-02-21 DIAGNOSIS — E785 Hyperlipidemia, unspecified: Secondary | ICD-10-CM

## 2018-02-21 NOTE — Progress Notes (Signed)
Subjective:  Patient ID: [name], female    DOB: 03/08/1941  Age: 77 y.o. MRN: [id number]  CC: The primary encounter diagnosis was Hypothyroidism due to acquired atrophy of thyroid. Diagnoses of Hyperlipidemia associated with type 2 diabetes mellitus ([place]) and DM type 2, controlled, with complication ([place]) were also pertinent to this visit.  HPI EULAR PANEK presents for   3 month follow up on diabetes.  Patient has no complaints today.  Patient is following a low glycemic index diet and taking all prescribed medications regularly without side effects.  Fasting sugars have been under less than 140 most of the time and post prandials have been under 160 except on rare occasions. Patient has started walking again  about 3 times per week and intentionally trying to lose weight .  Patient has had an eye exam in the last 12 months and checks feet regularly for signs of infection.  Patient does not walk barefoot outside,  And denies an numbness tingling or burning in feet. Patient is up to date on all recommended vaccinations  Last eye exam jun 2019   using atkins bars, diet green tea and diet noncaffeinated sodas    Lab Results  Component Value Date   HGBA1C 6.6 (H) 02/19/2018    Outpatient Medications Prior to Visit  Medication Sig Dispense Refill  . ALPRAZolam (XANAX) 0.25 MG tablet TAKE ONE TABLET BY MOUTH EVERY 6 HOURS AS NEEDED FOR ANXIETY 30 tablet 5  . aspirin 81 MG tablet Take 81 mg by mouth daily.    Marland Kitchen atorvastatin (LIPITOR) 20 MG tablet TAKE ONE TABLET BY MOUTH EVERY DAY 90 tablet 0  . calcium-vitamin D (OSCAL WITH D) 500-200 MG-UNIT per tablet Take 2 tablets by mouth daily. Reported on 03/21/2016    . GE100 BLOOD GLUCOSE TEST test strip CHECK TWICE A DAY 100 each 1  . glipiZIDE (GLUCOTROL) 10 MG tablet Take 1 tablet (10 mg total) by mouth 2 (two) times daily before a meal. 60 tablet 5  . glucose blood (ONE TOUCH ULTRA TEST) test strip USE AS DIRECTED 100 each 1  . glucose  blood test strip Check sugars twice daily,ascensia contour micro fill test strips, Dx 250.00 100 each 5  . JANUVIA 50 MG tablet TAKE ONE TABLET BY MOUTH DAILY 90 tablet 1  . levothyroxine (SYNTHROID, LEVOTHROID) 88 MCG tablet TAKE 1 TABLET EVERY DAY ON EMPTY STOMACHWITH A GLASS OF WATER AT LEAST 30-60 MINBEFORE BREAKFAST 90 tablet 1  . losartan (COZAAR) 50 MG tablet TAKE ONE TABLET BY MOUTH DAILY 90 tablet 1  . ONE TOUCH ULTRA TEST test strip USE AS DIRECTED 100 each 1  . losartan (COZAAR) 50 MG tablet TAKE ONE TABLET BY MOUTH DAILY (Patient not taking: Reported on 02/21/2018) 90 tablet 1   No facility-administered medications prior to visit.     Review of Systems;  Patient denies headache, fevers, malaise, unintentional weight loss, skin rash, eye pain, sinus congestion and sinus pain, sore throat, dysphagia,  hemoptysis , cough, dyspnea, wheezing, chest pain, palpitations, orthopnea, edema, abdominal pain, nausea, melena, diarrhea, constipation, flank pain, dysuria, hematuria, urinary  Frequency, nocturia, numbness, tingling, seizures,  Focal weakness, Loss of consciousness,  Tremor, insomnia, depression, anxiety, and suicidal ideation.      Objective:  BP 130/68 (BP Location: Left Arm, Patient Position: Sitting, Cuff Size: Normal)   Pulse (!) 58   Temp 98.4 F (36.9 C) (Oral)   Resp 15   Ht 5\' 3"  (1.6 m)  Wt 150 lb (68 kg)   SpO2 94%   BMI 26.57 kg/m   BP Readings from Last 3 Encounters:  02/21/18 130/68  11/21/17 130/62  10/23/17 (!) 115/98    Wt Readings from Last 3 Encounters:  02/21/18 150 lb (68 kg)  11/21/17 153 lb 3.2 oz (69.5 kg)  10/23/17 152 lb (68.9 kg)    General appearance: alert, cooperative and appears stated age Ears: normal TM's and external ear canals both ears Throat: lips, mucosa, and tongue normal; teeth and gums normal Neck: no adenopathy, no carotid bruit, supple, symmetrical, trachea midline and thyroid not enlarged, symmetric, no  tenderness/mass/nodules Back: symmetric, no curvature. ROM normal. No CVA tenderness. Lungs: clear to auscultation bilaterally Heart: regular rate and rhythm, S1, S2 normal, no murmur, click, rub or gallop Abdomen: soft, non-tender; bowel sounds normal; no masses,  no organomegaly Pulses: 2+ and symmetric Skin: Skin color, texture, turgor normal. No rashes or lesions Lymph nodes: Cervical, supraclavicular, and axillary nodes normal.  Lab Results  Component Value Date   HGBA1C 6.6 (H) 02/19/2018   HGBA1C 7.0 (H) 11/18/2017   HGBA1C 7.1 08/23/2017    Lab Results  Component Value Date   CREATININE 0.71 02/19/2018   CREATININE 0.72 11/18/2017   CREATININE 0.77 05/21/2017    Lab Results  Component Value Date   WBC 7.1 03/18/2014   HGB 13.3 03/18/2014   HCT 40.0 03/18/2014   PLT 183.0 03/18/2014   GLUCOSE 105 (H) 02/19/2018   CHOL 158 02/19/2018   TRIG 236.0 (H) 02/19/2018   HDL 44.50 02/19/2018   LDLDIRECT 87.0 02/19/2018   LDLCALC 63 11/18/2017   ALT 14 02/19/2018   AST 13 02/19/2018   NA 140 02/19/2018   K 4.9 02/19/2018   CL 104 02/19/2018   CREATININE 0.71 02/19/2018   BUN 24 (H) 02/19/2018   CO2 31 02/19/2018   TSH 0.90 11/18/2017   HGBA1C 6.6 (H) 02/19/2018   MICROALBUR 1.3 02/18/2017    No results found.  Assessment & Plan:   Problem List Items Addressed This Visit    Hypothyroidism - Primary    Thyroid function is WNL on current dose.  No current changes needed.   Lab Results  Component Value Date   TSH 0.90 11/18/2017         Relevant Orders   TSH   Hyperlipidemia associated with type 2 diabetes mellitus ([place])    Well controlled currently on high potency statin. LDL is < 70 on lipitor 20 mg . liver enzymes are normal,  No changes today as statin panel as NONFASTING. She has no side effects and liver enzymes are normal.   Lab Results  Component Value Date   CHOL 158 02/19/2018   HDL 44.50 02/19/2018   LDLCALC 63 11/18/2017   LDLDIRECT 87.0  02/19/2018   TRIG 236.0 (H) 02/19/2018   CHOLHDL 4 02/19/2018   Lab Results  Component Value Date   ALT 14 02/19/2018   AST 13 02/19/2018   ALKPHOS 96 02/19/2018   BILITOT 0.8 02/19/2018                        Relevant Orders   Lipid panel   DM type 2, controlled, with complication ([place])    Remains under good  control with glipizide 10 mg bid  And Januvia  50 mg daily .  She is up to date on annual eye exam and foot exam is normal today. Patient has minimal  microalbuminuria. Patient  is tolerating aspirin and statin therapy for CAD risk reduction and on ARB for renal protection and hypertension   Lab Results  Component Value Date   HGBA1C 6.6 (H) 02/19/2018   Lab Results  Component Value Date   MICROALBUR 1.3 02/18/2017        Relevant Orders   Hemoglobin A1c   Microalbumin / creatinine urine ratio   Comprehensive metabolic panel     A total of 25 minutes of face to face time was spent with patient more than half of which was spent in counselling about the above mentioned conditions,her need for regular exercise  and coordination of care   I am having Ivin Booty B. Hosmer maintain her calcium-vitamin D, aspirin, glucose blood, GE100 BLOOD GLUCOSE TEST, losartan, glucose blood, ONE TOUCH ULTRA TEST, ALPRAZolam, glipiZIDE, JANUVIA, levothyroxine, and atorvastatin.  No orders of the defined types were placed in this encounter.   Medications Discontinued During This Encounter  Medication Reason  . losartan (COZAAR) 50 MG tablet Duplicate    Follow-up: Return in about 6 months (around 08/23/2018) for follow up diabetes, CPE.   Crecencio Mc, MD

## 2018-02-21 NOTE — Patient Instructions (Addendum)
Your diabetes  Is now under excellent control  And your cholesterol and other labs are also normal.  Please continue your current medications.  I'll see you in in 6 months    Diabetes Mellitus and Exercise Exercising regularly is important for your overall health, especially when you have diabetes (diabetes mellitus). Exercising is not only about losing weight. It has many health benefits, such as increasing muscle strength and bone density and reducing body fat and stress. This leads to improved fitness, flexibility, and endurance, all of which result in better overall health. Exercise has additional benefits for people with diabetes, including:  Reducing appetite.  Helping to lower and control blood glucose.  Lowering blood pressure.  Helping to control amounts of fatty substances (lipids) in the blood, such as cholesterol and triglycerides.  Helping the body to respond better to insulin (improving insulin sensitivity).  Reducing how much insulin the body needs.  Decreasing the risk for heart disease by: ? Lowering cholesterol and triglyceride levels. ? Increasing the levels of good cholesterol. ? Lowering blood glucose levels.  What is my activity plan? Your health care provider or certified diabetes educator can help you make a plan for the type and frequency of exercise (activity plan) that works for you. Make sure that you:  Do at least 150 minutes of moderate-intensity or vigorous-intensity exercise each week. This could be brisk walking, biking, or water aerobics. ? Do stretching and strength exercises, such as yoga or weightlifting, at least 2 times a week. ? Spread out your activity over at least 3 days of the week.  Get some form of physical activity every day. ? Do not go more than 2 days in a row without some kind of physical activity. ? Avoid being inactive for more than 90 minutes at a time. Take frequent breaks to walk or stretch.  Choose a type of exercise or  activity that you enjoy, and set realistic goals.  Start slowly, and gradually increase the intensity of your exercise over time.  What do I need to know about managing my diabetes?  Check your blood glucose before and after exercising. ? If your blood glucose is higher than 240 mg/dL (13.3 mmol/L) before you exercise, check your urine for ketones. If you have ketones in your urine, do not exercise until your blood glucose returns to normal.  Know the symptoms of low blood glucose (hypoglycemia) and how to treat it. Your risk for hypoglycemia increases during and after exercise. Common symptoms of hypoglycemia can include: ? Hunger. ? Anxiety. ? Sweating and feeling clammy. ? Confusion. ? Dizziness or feeling light-headed. ? Increased heart rate or palpitations. ? Blurry vision. ? Tingling or numbness around the mouth, lips, or tongue. ? Tremors or shakes. ? Irritability.  Keep a rapid-acting carbohydrate snack available before, during, and after exercise to help prevent or treat hypoglycemia.  Avoid injecting insulin into areas of the body that are going to be exercised. For example, avoid injecting insulin into: ? The arms, when playing tennis. ? The legs, when jogging.  Keep records of your exercise habits. Doing this can help you and your health care provider adjust your diabetes management plan as needed. Write down: ? Food that you eat before and after you exercise. ? Blood glucose levels before and after you exercise. ? The type and amount of exercise you have done. ? When your insulin is expected to peak, if you use insulin. Avoid exercising at times when your insulin is peaking.  When you start a new exercise or activity, work with your health care provider to make sure the activity is safe for you, and to adjust your insulin, medicines, or food intake as needed.  Drink plenty of water while you exercise to prevent dehydration or heat stroke. Drink enough fluid to keep  your urine clear or pale yellow. This information is not intended to replace advice given to you by your health care provider. Make sure you discuss any questions you have with your health care provider. Document Released: 11/10/2003 Document Revised: 03/09/2016 Document Reviewed: 01/30/2016 Elsevier Interactive Patient Education  2018 [employer].

## 2018-02-23 NOTE — Assessment & Plan Note (Signed)
Remains under good  control with glipizide 10 mg bid  And Januvia  50 mg daily .  She is up to date on annual eye exam and foot exam is normal today. Patient has minimal  microalbuminuria. Patient  is tolerating aspirin and statin therapy for CAD risk reduction and on ARB for renal protection and hypertension   Lab Results  Component Value Date   HGBA1C 6.6 (H) 02/19/2018   Lab Results  Component Value Date   MICROALBUR 1.3 02/18/2017

## 2018-02-23 NOTE — Assessment & Plan Note (Signed)
Thyroid function is WNL on current dose.  No current changes needed.   Lab Results  Component Value Date   TSH 0.90 11/18/2017

## 2018-02-23 NOTE — Assessment & Plan Note (Addendum)
Well controlled currently on high potency statin. LDL is < 70 on lipitor 20 mg . liver enzymes are normal,  No changes today as statin panel as NONFASTING. She has no side effects and liver enzymes are normal.   Lab Results  Component Value Date   CHOL 158 02/19/2018   HDL 44.50 02/19/2018   LDLCALC 63 11/18/2017   LDLDIRECT 87.0 02/19/2018   TRIG 236.0 (H) 02/19/2018   CHOLHDL 4 02/19/2018   Lab Results  Component Value Date   ALT 14 02/19/2018   AST 13 02/19/2018   ALKPHOS 96 02/19/2018   BILITOT 0.8 02/19/2018

## 2018-03-12 ENCOUNTER — Encounter: Payer: Self-pay | Admitting: Internal Medicine

## 2018-03-22 ENCOUNTER — Other Ambulatory Visit: Payer: Self-pay | Admitting: Internal Medicine

## 2018-04-11 ENCOUNTER — Ambulatory Visit: Payer: PPO

## 2018-04-15 ENCOUNTER — Other Ambulatory Visit: Payer: Self-pay | Admitting: Internal Medicine

## 2018-04-15 ENCOUNTER — Ambulatory Visit (INDEPENDENT_AMBULATORY_CARE_PROVIDER_SITE_OTHER): Payer: PPO

## 2018-04-15 VITALS — BP 126/64 | HR 53 | Temp 98.4°F | Resp 14 | Ht 63.0 in | Wt 150.1 lb

## 2018-04-15 DIAGNOSIS — E118 Type 2 diabetes mellitus with unspecified complications: Secondary | ICD-10-CM

## 2018-04-15 DIAGNOSIS — Z0000 Encounter for general adult medical examination without abnormal findings: Secondary | ICD-10-CM | POA: Diagnosis not present

## 2018-04-15 NOTE — Progress Notes (Signed)
Subjective:   [name] is a 77 y.o. female who presents for Medicare Annual (Subsequent) preventive examination.  Review of Systems:  No ROS.  Medicare Wellness Visit. Additional risk factors are reflected in the social history.  Cardiac Risk Factors include: advanced age (>85men, >45 women);diabetes mellitus;hypertension     Objective:     Vitals: BP 126/64 (BP Location: Left Arm, Patient Position: Sitting, Cuff Size: Normal)   Pulse (!) 53   Temp 98.4 F (36.9 C) (Oral)   Resp 14   Ht 5\' 3"  (1.6 m)   Wt 150 lb 1.9 oz (68.1 kg)   SpO2 95%   BMI 26.59 kg/m   Body mass index is 26.59 kg/m.  Advanced Directives 04/15/2018 10/23/2017 04/10/2017 12/21/2015 03/30/2015  Does Patient Have a Medical Advance Directive? Yes Yes Yes Yes No  Type of Paramedic of [place];Living will [place];Living will [place];Living will [place];Living will -  Does patient want to make changes to medical advance directive? No - Patient declined - No - Patient declined - -  Copy of [place] in Chart? No - copy requested No - copy requested No - copy requested No - copy requested -  Would patient like information on creating a medical advance directive? - - - - Yes - Scientist, clinical (histocompatibility and immunogenetics) given    Tobacco Social History   Tobacco Use  Smoking Status Never Smoker  Smokeless Tobacco Never Used     Counseling given: Not Answered   Clinical Intake:  Pre-visit preparation completed: Yes  Pain : No/denies pain     Nutritional Status: BMI 25 -29 Overweight Diabetes: Yes(Followed by pcp)  How often do you need to have someone help you when you read instructions, pamphlets, or other written materials from your doctor or pharmacy?: 1 - Never  Interpreter Needed?: No     Past Medical History:  Diagnosis Date  . Diabetes mellitus   . GERD (gastroesophageal reflux disease)   .  Hyperlipidemia   . Hypertension   . Osteoporosis   . Positive colorectal cancer screening using Cologuard test 2019  . Thyroid disease    Past Surgical History:  Procedure Laterality Date  . BREAST CYST ASPIRATION Bilateral 25+ yrs ago  . CATARACT EXTRACTION W/PHACO Right 03/02/2015   Procedure: CATARACT EXTRACTION PHACO AND INTRAOCULAR LENS PLACEMENT ([place]) TORIC LENS;  Surgeon: Leandrew Koyanagi, MD;  Location: [facility] [place];  Service: Ophthalmology;  Laterality: Right;  TORIC  . CATARACT EXTRACTION W/PHACO Left 03/30/2015   Procedure: CATARACT EXTRACTION PHACO AND INTRAOCULAR LENS PLACEMENT ([facility]);  Surgeon: Leandrew Koyanagi, MD;  Location: [facility];  Service: Ophthalmology;  Laterality: Left;  TORIC  DIABETIC - oral meds  . CERVICAL POLYPECTOMY    . COLONOSCOPY  01/2007   Dr Olevia Perches  . COLONOSCOPY WITH PROPOFOL N/A 10/23/2017   Procedure: COLONOSCOPY WITH PROPOFOL;  Surgeon: Robert Bellow, MD;  Location: [facility];  Service: Endoscopy;  Laterality: N/A;  . HUMERUS FRACTURE SURGERY    . VAGINAL DELIVERY    . WRIST FRACTURE SURGERY     Family History  Problem Relation Age of Onset  . Hypertension Mother   . Stroke Mother 82       hemorrhagic  . Heart disease Father   . Cancer Father        Lung CA,  died of AMI while in [place] getting [place]  . Diabetes Brother   . Hypertension  Brother   . Hypertension Son   . Heart disease Maternal Grandfather   . Cancer Maternal Grandfather   . Breast cancer Maternal Grandmother 57  . Depression Sister    Social History   Socioeconomic History  . Marital status: Widowed    Spouse name: Not on file  . Number of children: 2  . Years of education: Not on file  . Highest education level: Not on file  Occupational History  . Occupation: Doctor, hospital: RETIRED  Social Needs  . Financial resource strain: Not hard at all  . Food insecurity:    Worry: Never true    Inability: Never true   . Transportation needs:    Medical: No    Non-medical: No  Tobacco Use  . Smoking status: Never Smoker  . Smokeless tobacco: Never Used  Substance and Sexual Activity  . Alcohol use: No  . Drug use: No  . Sexual activity: Never  Lifestyle  . Physical activity:    Days per week: 2 days    Minutes per session: 20 min  . Stress: Not at all  Relationships  . Social connections:    Talks on phone: Not on file    Gets together: Not on file    Attends religious service: Not on file    Active member of club or organization: Not on file    Attends meetings of clubs or organizations: Not on file    Relationship status: Widowed  Other Topics Concern  . Not on file  Social History Narrative   Widowed in spring of 2012   Lost brother also   Has living will   Son and daughter hold health care POA   Would accept resuscitation but no prolonged artificial ventilation   Not sure about feeding tube    Outpatient Encounter Medications as of 04/15/2018  Medication Sig  . ALPRAZolam (XANAX) 0.25 MG tablet TAKE ONE TABLET BY MOUTH EVERY 6 HOURS AS NEEDED FOR ANXIETY  . aspirin 81 MG tablet Take 81 mg by mouth daily.  Marland Kitchen atorvastatin (LIPITOR) 20 MG tablet TAKE ONE TABLET BY MOUTH EVERY DAY  . calcium-vitamin D (OSCAL WITH D) 500-200 MG-UNIT per tablet Take 2 tablets by mouth daily. Reported on 03/21/2016  . GE100 BLOOD GLUCOSE TEST test strip CHECK TWICE A DAY  . glipiZIDE (GLUCOTROL) 10 MG tablet Take 1 tablet (10 mg total) by mouth 2 (two) times daily before a meal.  . glucose blood (ONE TOUCH ULTRA TEST) test strip USE AS DIRECTED  . glucose blood test strip Check sugars twice daily,[employer] contour micro fill test strips, Dx 250.00  . JANUVIA 50 MG tablet TAKE ONE TABLET BY MOUTH DAILY  . levothyroxine (SYNTHROID, LEVOTHROID) 88 MCG tablet TAKE 1 TABLET EVERY DAY ON EMPTY STOMACHWITH A GLASS OF WATER AT LEAST 30-60 MINBEFORE BREAKFAST  . losartan (COZAAR) 50 MG tablet TAKE ONE TABLET BY  MOUTH DAILY  . ONE TOUCH ULTRA TEST test strip USE AS DIRECTED  . ONE TOUCH ULTRA TEST test strip USE AS DIRECTED  . [DISCONTINUED] lisinopril (PRINIVIL,ZESTRIL) 10 MG tablet Take 1 tablet (10 mg total) by mouth daily.  . [DISCONTINUED] ranitidine (ZANTAC) 150 MG capsule Take 150 mg by mouth 2 (two) times daily.     No facility-administered encounter medications on file as of 04/15/2018.     Activities of Daily Living In your present state of health, do you have any difficulty performing the following activities: 04/15/2018  Hearing? N  Vision? N  Difficulty concentrating or making decisions? N  Walking or climbing stairs? N  Dressing or bathing? N  Doing errands, shopping? N  Preparing Food and eating ? N  Using the Toilet? N  In the past six months, have you accidently leaked urine? N  Do you have problems with loss of bowel control? N  Managing your Medications? N  Managing your Finances? N  Housekeeping or managing your Housekeeping? N  Some recent data might be hidden    Patient Care Team: Crecencio Mc, MD as PCP - General (Internal Medicine)    Assessment:   This is a routine wellness examination for Brizeyda.  The goal of the wellness visit is to assist the patient how to close the gaps in care and create a preventative care plan for the patient.   The roster of all physicians providing medical care to patient is listed in the Snapshot section of the chart.  Taking calcium VIT D as appropriate/Osteoporosis reviewed.    Safety issues reviewed; Lives alone.  Life alert encouraged. Smoke and carbon monoxide detectors in the home. Firearms locked in a safe within the home. Wears seatbelts when driving or riding with others. No violence in the home.  They do not have excessive sun exposure.  Discussed the need for sun protection: hats, long sleeves and the use of sunscreen if there is significant sun exposure.  Patient is alert, normal appearance, oriented to  person/place/and time. Correctly identified the president of the [place] and recalls of 3/3 words.Performs simple calculations and can read correct time from watch face. Displays appropriate judgement.  No new identified risk were noted.  No failures at ADL's or IADL's.    BMI- discussed the importance of a healthy diet, water intake and the benefits of aerobic exercise. Educational material provided.   24 hour diet recall: Regular diet. Low carb diet encouraged.   Dental- every 6 months.  Sleep patterns- Sleeps 8 hours at night.  Wakes feeling rested.   Health maintenance gaps- closed.  Patient Concerns: None at this time. Follow up with PCP as needed.  Exercise Activities and Dietary recommendations Current Exercise Habits: Home exercise routine, Type of exercise: walking, Time (Minutes): 20, Frequency (Times/Week): 2, Weekly Exercise (Minutes/Week): 40, Intensity: Moderate  Goals    . DIET - INCREASE LEAN PROTEINS     Low carb diet    . Increase physical activity     Weight loss Walk for exercise (increase from 2 to 5 days)       Fall Risk Fall Risk  04/15/2018 04/10/2017 02/18/2017 12/21/2015 12/22/2014  Falls in the past year? No No No Yes No  Number falls in past yr: - - - 1 -  Injury with Fall? - - - No -   Depression Screen PHQ 2/9 Scores 04/15/2018 04/10/2017 02/18/2017 12/21/2015  PHQ - 2 Score 0 0 0 0  PHQ- 9 Score - 0 0 -     Cognitive Function MMSE - Mini Mental State Exam 04/15/2018 04/10/2017 12/21/2015  Orientation to time 5 5 5   Orientation to Place 5 5 5   Registration 3 3 3   Attention/ Calculation 5 5 5   Recall 3 3 3   Language- name 2 objects 2 2 2   Language- repeat 1 1 1   Language- follow 3 step command 3 3 3   Language- read & follow direction 1 1 1   Write a sentence 1 1 1   Copy design 1 1 1   Total score 30 30  30        Immunization History  Administered Date(s) Administered  . Influenza Split 05/24/2014  . Influenza Whole 08/12/2007  . Influenza,  High Dose Seasonal PF 06/06/2015  . Influenza-Unspecified 05/04/2014, 06/03/2016, 06/18/2017  . Pneumococcal Conjugate-13 06/21/2014  . Pneumococcal Polysaccharide-23 02/18/2008, 09/21/2015  . Td 10/16/2004  . Tdap 02/03/2014   Screening Tests Health Maintenance  Topic Date Due  . INFLUENZA VACCINE  04/03/2018  . MAMMOGRAM  08/19/2018  . HEMOGLOBIN A1C  08/21/2018  . OPHTHALMOLOGY EXAM  02/07/2019  . FOOT EXAM  02/22/2019  . TETANUS/TDAP  02/04/2024  . DEXA SCAN  Completed  . PNA vac Low Risk Adult  Completed      Plan:    End of life planning; Advance aging; Advanced directives discussed. Copy of current HCPOA/Living Will requested.    I have personally reviewed and noted the following in the patient's chart:   . Medical and social history . Use of alcohol, tobacco or illicit drugs  . Current medications and supplements . Functional ability and status . Nutritional status . Physical activity . Advanced directives . List of other physicians . Hospitalizations, surgeries, and ER visits in previous 12 months . Vitals . Screenings to include cognitive, depression, and falls . Referrals and appointments  In addition, I have reviewed and discussed with patient certain preventive protocols, quality metrics, and best practice recommendations. A written personalized care plan for preventive services as well as general preventive health recommendations were provided to patient.     Varney Biles, LPN  [id number]   Reviewed above information.  Agree with assessment and plan.   Dr Nicki Reaper

## 2018-04-15 NOTE — Patient Instructions (Addendum)
  Ms. Busey , Thank you for taking time to come for your Medicare Wellness Visit. I appreciate your ongoing commitment to your health goals. Please review the following plan we discussed and let me know if I can assist you in the future.   Follow up as needed.    Bring a copy of your [place] and/or Living Will to be scanned into chart.  Per request, return 9/23 for A1C only.   Have a great day!  These are the goals we discussed: Goals    . Increase physical activity     Weight 145lb Walk for exercise       This is a list of the screening recommended for you and due dates:  Health Maintenance  Topic Date Due  . Flu Shot  04/03/2018  . Mammogram  08/19/2018  . Hemoglobin A1C  08/21/2018  . Eye exam for diabetics  02/07/2019  . Complete foot exam   02/22/2019  . Tetanus Vaccine  02/04/2024  . DEXA scan (bone density measurement)  Completed  . Pneumonia vaccines  Completed

## 2018-05-26 ENCOUNTER — Other Ambulatory Visit: Payer: PPO

## 2018-05-27 ENCOUNTER — Other Ambulatory Visit: Payer: Self-pay | Admitting: Internal Medicine

## 2018-06-02 ENCOUNTER — Other Ambulatory Visit: Payer: Self-pay | Admitting: Internal Medicine

## 2018-06-03 ENCOUNTER — Telehealth: Payer: Self-pay | Admitting: Internal Medicine

## 2018-06-03 NOTE — Telephone Encounter (Signed)
Copied from [place] [phone]. Topic: Quick Communication - See Telephone Encounter >> Jun 03, 2018  8:48 AM Conception Chancy, NT wrote: CRM for notification. See Telephone encounter for: 06/03/18.  Patient is calling and states that she went to get a refill on JANUVIA 50 MG tablet and she has been paying $45 and now she is going to have to pay $140. She states she can not pay that and would like to know if there is something else that can be called in.  [facility] - [place], [place] - [address] [address] Phone: [phone] Fax: [phone]

## 2018-06-03 NOTE — Telephone Encounter (Signed)
Spoke with pt and she stated that she does not have a formulary at home but that she would call her insurance company to see what might be a cheaper option for her.

## 2018-06-03 NOTE — Telephone Encounter (Signed)
rx change request

## 2018-06-03 NOTE — Telephone Encounter (Signed)
Pt called to let us know that her Januvia rx has gone up to $140 and she cannot afford that. Pt is wanting to know if there is something else that can be called in?

## 2018-06-03 NOTE — Telephone Encounter (Signed)
Not without seeing her formulary.  Can she bring it by?

## 2018-06-05 NOTE — Telephone Encounter (Signed)
Spoke with pt and she stated that she does not want to take the metformin. She stated that she did once before and it caused her to have diarrhea. The pt stated that she spoke with [facility] yesterday and they told her that with her income she qualifies for pt assistance on the Januvia. The pt will be able to get it for free from a certain company. They are sending the pt some paperwork to have filled out and then she will bring the paperwork up here for [place] a do our part. Pt stated that it may take up to 3 weeks before she able to actually get the rx. The pt is wanting to know what she needs to do in the mean time. Pt is wondering if she should take an extra glipizide.

## 2018-06-05 NOTE — Telephone Encounter (Signed)
Is she willing to take metformin 500 mg twice daily? If so we cna send to local pharmacy

## 2018-06-05 NOTE — Telephone Encounter (Signed)
NO.  NOTHING TO DO BUT WAIT,  NO EXTRA GLIPIZIDE

## 2018-06-09 NOTE — Telephone Encounter (Signed)
Attempted to call pt. No answer no voicemail. PEC may speak with pt.  

## 2018-06-10 ENCOUNTER — Other Ambulatory Visit: Payer: Self-pay | Admitting: Internal Medicine

## 2018-06-10 DIAGNOSIS — Z1231 Encounter for screening mammogram for malignant neoplasm of breast: Secondary | ICD-10-CM

## 2018-06-10 NOTE — Telephone Encounter (Signed)
Spoke with pt and informed her that she can not add any extra glipizide, that she will just have to wait until she can the get Januvia. The pt gave a verbal understanding.

## 2018-06-12 ENCOUNTER — Telehealth: Payer: Self-pay | Admitting: Internal Medicine

## 2018-06-12 DIAGNOSIS — E118 Type 2 diabetes mellitus with unspecified complications: Secondary | ICD-10-CM

## 2018-06-12 NOTE — Telephone Encounter (Signed)
Pt dropped off a form for Dr. Derrel Nip to complete for [employer] Patient Assistance program for her Januvia. Form is up front in Dr. Lupita Dawn color folder.

## 2018-06-16 NOTE — Telephone Encounter (Signed)
Form has been completed and mailed out.

## 2018-07-14 MED ORDER — METFORMIN HCL ER 500 MG PO TB24: 500.0000 mg | ORAL_TABLET | Freq: Every day | ORAL | 1 refills | Status: DC

## 2018-07-14 NOTE — Telephone Encounter (Signed)
Pt states that she used to take the metFORMIN (GLUCOPHAGE) 500 MG tablet   slow release

## 2018-07-14 NOTE — Telephone Encounter (Signed)
It can be taken any time .

## 2018-07-14 NOTE — Telephone Encounter (Signed)
Yes she can start the metformin XR ,  One pill daily.  [facility].  needs labs asap.

## 2018-07-14 NOTE — Telephone Encounter (Signed)
Spoke with pt and informed her that the medication has been called in to [facility] and that she needs to take it once a day. Pt stated that she takes glipiizide 10mg  in the morning before breakfast and 10mg  again before supper. Pt is wanting to know if it would be okay to take the metformin sometime between the two glipizides?

## 2018-07-14 NOTE — Telephone Encounter (Signed)
Please advise 

## 2018-07-14 NOTE — Telephone Encounter (Signed)
Pt calling stating that she still haven't been on anything but the Glipizide and that her sugars have been going between 200-300 she would like to get a teir 1 Metformin slow release she would like this prescribe today she took her readings this morning around 5:30 it read 221 and around 9:40 this morning it read 199 please give her a call back at [phone]

## 2018-07-14 NOTE — Telephone Encounter (Signed)
Tried to call patient but phone line was busy.

## 2018-07-14 NOTE — Telephone Encounter (Signed)
Pt was prescribed Januvia but couldn't afford it so we filled out paperwork for patient assistance and faxed it in 4 weeks ago. The pt stated that she is still taking just the Glipizide and her sugars have been running between 200-300. Pt is wanting to know if she can start back on the metformin 500mg  slow release to help with her sugars.

## 2018-07-15 NOTE — Telephone Encounter (Signed)
Spoke with pt to let her know that she can take the metformin any time during the day. Pt stated that she would take the glipizide in the morning and the evening and take the metformin at lunch time.

## 2018-07-28 DIAGNOSIS — M1711 Unilateral primary osteoarthritis, right knee: Secondary | ICD-10-CM | POA: Diagnosis not present

## 2018-07-30 ENCOUNTER — Other Ambulatory Visit: Payer: Self-pay | Admitting: Internal Medicine

## 2018-08-20 ENCOUNTER — Ambulatory Visit
Admission: RE | Admit: 2018-08-20 | Discharge: 2018-08-20 | Disposition: A | Discharge status: Home / Self Care | Payer: PPO | Source: Ambulatory Visit | Attending: Internal Medicine | Admitting: Internal Medicine

## 2018-08-20 DIAGNOSIS — Z1231 Encounter for screening mammogram for malignant neoplasm of breast: Secondary | ICD-10-CM | POA: Insufficient documentation

## 2018-08-22 ENCOUNTER — Other Ambulatory Visit (INDEPENDENT_AMBULATORY_CARE_PROVIDER_SITE_OTHER): Payer: PPO

## 2018-08-22 DIAGNOSIS — E785 Hyperlipidemia, unspecified: Secondary | ICD-10-CM | POA: Diagnosis not present

## 2018-08-22 DIAGNOSIS — E034 Atrophy of thyroid (acquired): Secondary | ICD-10-CM

## 2018-08-22 DIAGNOSIS — E118 Type 2 diabetes mellitus with unspecified complications: Secondary | ICD-10-CM

## 2018-08-22 DIAGNOSIS — E1169 Type 2 diabetes mellitus with other specified complication: Secondary | ICD-10-CM

## 2018-08-22 LAB — LIPID PANEL
Cholesterol: 161 mg/dL (ref 0–200)
HDL: 56.3 mg/dL
LDL Cholesterol: 82 mg/dL (ref 0–99)
NonHDL: 104.82
Total CHOL/HDL Ratio: 3
Triglycerides: 114 mg/dL (ref 0.0–149.0)
VLDL: 22.8 mg/dL (ref 0.0–40.0)

## 2018-08-22 LAB — COMPREHENSIVE METABOLIC PANEL WITH GFR
ALT: 13 U/L (ref 0–35)
AST: 11 U/L (ref 0–37)
Albumin: 4.5 g/dL (ref 3.5–5.2)
Alkaline Phosphatase: 98 U/L (ref 39–117)
BUN: 23 mg/dL (ref 6–23)
CO2: 30 meq/L (ref 19–32)
Calcium: 10 mg/dL (ref 8.4–10.5)
Chloride: 102 meq/L (ref 96–112)
Creatinine, Ser: 0.79 mg/dL (ref 0.40–1.20)
GFR: 75.03 mL/min
Glucose, Bld: 147 mg/dL — ABNORMAL HIGH (ref 70–99)
Potassium: 4.7 meq/L (ref 3.5–5.1)
Sodium: 138 meq/L (ref 135–145)
Total Bilirubin: 0.8 mg/dL (ref 0.2–1.2)
Total Protein: 7.1 g/dL (ref 6.0–8.3)

## 2018-08-22 LAB — TSH: TSH: 1.39 u[IU]/mL (ref 0.35–4.50)

## 2018-08-22 LAB — HEMOGLOBIN A1C: Hgb A1c MFr Bld: 7.1 % — ABNORMAL HIGH (ref 4.6–6.5)

## 2018-08-22 LAB — MICROALBUMIN / CREATININE URINE RATIO
Creatinine,U: 111.5 mg/dL
MICROALB/CREAT RATIO: 0.6 mg/g (ref 0.0–30.0)
Microalb, Ur: <0.7 mg/dL (ref 0.0–1.9)

## 2018-08-22 NOTE — Addendum Note (Signed)
Addended by: Leeanne Rio on: 08/22/2018 11:07 AM   Modules accepted: Orders

## 2018-08-25 ENCOUNTER — Encounter: Payer: Self-pay | Admitting: *Deleted

## 2018-09-01 ENCOUNTER — Ambulatory Visit (INDEPENDENT_AMBULATORY_CARE_PROVIDER_SITE_OTHER): Payer: PPO | Admitting: Internal Medicine

## 2018-09-01 ENCOUNTER — Encounter: Payer: Self-pay | Admitting: Internal Medicine

## 2018-09-01 DIAGNOSIS — E1169 Type 2 diabetes mellitus with other specified complication: Secondary | ICD-10-CM | POA: Diagnosis not present

## 2018-09-01 DIAGNOSIS — I10 Essential (primary) hypertension: Secondary | ICD-10-CM | POA: Diagnosis not present

## 2018-09-01 DIAGNOSIS — G8929 Other chronic pain: Secondary | ICD-10-CM

## 2018-09-01 DIAGNOSIS — L309 Dermatitis, unspecified: Secondary | ICD-10-CM | POA: Diagnosis not present

## 2018-09-01 DIAGNOSIS — E118 Type 2 diabetes mellitus with unspecified complications: Secondary | ICD-10-CM

## 2018-09-01 DIAGNOSIS — Z0000 Encounter for general adult medical examination without abnormal findings: Secondary | ICD-10-CM | POA: Diagnosis not present

## 2018-09-01 DIAGNOSIS — M25561 Pain in right knee: Secondary | ICD-10-CM

## 2018-09-01 DIAGNOSIS — E785 Hyperlipidemia, unspecified: Secondary | ICD-10-CM

## 2018-09-01 MED ORDER — MELOXICAM 7.5 MG PO TABS: 7.5000 mg | ORAL_TABLET | Freq: Every day | ORAL | 1 refills | Status: DC

## 2018-09-01 MED ORDER — ZOSTER VAC RECOMB ADJUVANTED 50 MCG/0.5ML IM SUSR: 0.5000 mL | Freq: Once | INTRAMUSCULAR | 1 refills | Status: AC

## 2018-09-01 NOTE — Progress Notes (Signed)
Patient ID: [name], female    DOB: [birth year]  Age: 77 y.o. MRN: [id number]  The patient is here for annual preventive  examination and management of other chronic and acute problems.  Defers breast exam since mammogram was just done Dec 18 Colonoscopy normal Feb 2019     The risk factors are reflected in the social history.  The roster of all physicians providing medical care to patient - is listed in the Snapshot section of the chart.  Activities of daily living:  The patient is 100% independent in all ADLs: dressing, toileting, feeding as well as independent mobility  Home safety : The patient has smoke detectors in the home. They wear seatbelts.  There are no firearms at home. There is no violence in the home.   There is no risks for hepatitis, STDs or HIV. There is no   history of blood transfusion. They have no travel history to infectious disease endemic areas of the world.  The patient has seen their dentist in the last six month. They have seen their eye doctor in the last year. They admit to slight hearing difficulty with regard to whispered voices and some television programs.  They have deferred audiologic testing in the last year.  They do not  have excessive sun exposure. Discussed the need for sun protection: hats, long sleeves and use of sunscreen if there is significant sun exposure.   Diet: the importance of a healthy diet is discussed. They do have a healthy diet.  The benefits of regular aerobic exercise were discussed. She walks 4 times per week ,  20 minutes.   Depression screen: there are no signs or vegative symptoms of depression- irritability, change in appetite, anhedonia, sadness/tearfullness.  Cognitive assessment: the patient manages all their financial and personal affairs and is actively engaged. They could relate day,date,year and events; recalled 2/3 objects at 3 minutes; performed clock-face test normally.  The following portions of the patient's  history were reviewed and updated as appropriate: allergies, current medications, past family history, past medical history,  past surgical history, past social history  and problem list.  Visual acuity was not assessed per patient preference since she has regular follow up with her ophthalmologist. Hearing and body mass index were assessed and reviewed.   During the course of the visit the patient was educated and counseled about appropriate screening and preventive services including : fall prevention , diabetes screening, nutrition counseling, colorectal cancer screening, and recommended immunizations.    CC: Diagnoses of Eczema of hand, DM type 2, controlled, with complication ([place]), Essential hypertension, Hyperlipidemia associated with type 2 diabetes mellitus ([place]), Chronic pain of right knee, and Routine general medical examination at a health care facility were pertinent to this visit.  1) 3 month follow up on diabetes.  Patient has no complaints today.  Patient is not following a low glycemic index diet and taking all prescribed medications regularly without side effects.  Fasting sugars around 150  And post prandials 190 except on rare occasions. Patient is not exercising due to right knee pain.or intentionally trying to lose weight .  Patient has had an eye exam in the last 12 months and checks feet regularly for signs of infection.  Patient does not walk barefoot outside,  And denies an numbness tingling or burning in feet. Patient is up to date on all recommended vaccinations   2) Knee pain : Had an urgent eval by [facility] for severe aching in right knee.   ,  Received a steroid injection  in the knee.  Helped transiently  No longer Hurting behind the knee and up the thigh   3) eczema: has not seen Hinda Kehr in over a year   History Jillianna has a past medical history of Diabetes mellitus, GERD (gastroesophageal reflux disease), Hyperlipidemia, Hypertension, Osteoporosis,  Positive colorectal cancer screening using Cologuard test (2019), and Thyroid disease.   She has a past surgical history that includes Vaginal delivery; Cervical polypectomy; Humerus fracture surgery; Wrist fracture surgery; Cataract extraction w/PHACO (Right, 03/02/2015); Cataract extraction w/PHACO (Left, 03/30/2015); Breast cyst aspiration (Bilateral, 25+ yrs ago); Colonoscopy (01/2007); and Colonoscopy with propofol (N/A, 10/23/2017).   Her family history includes Breast cancer (age of onset: 20) in her maternal grandmother; Cancer in her father and maternal grandfather; Depression in her sister; Diabetes in her brother; Heart disease in her father and maternal grandfather; Hypertension in her brother, mother, and son; Stroke (age of onset: 36) in her mother.She reports that she has never smoked. She has never used smokeless tobacco. She reports that she does not drink alcohol or use drugs.  Outpatient Medications Prior to Visit  Medication Sig Dispense Refill  . ALPRAZolam (XANAX) 0.25 MG tablet TAKE ONE TABLET BY MOUTH EVERY 6 HOURS AS NEEDED FOR ANXIETY 30 tablet 5  . aspirin 81 MG tablet Take 81 mg by mouth daily.    Marland Kitchen atorvastatin (LIPITOR) 20 MG tablet TAKE ONE TABLET BY MOUTH EVERY DAY 90 tablet 0  . calcium-vitamin D (OSCAL WITH D) 500-200 MG-UNIT per tablet Take 2 tablets by mouth daily. Reported on 03/21/2016    . GE100 BLOOD GLUCOSE TEST test strip CHECK TWICE A DAY 100 each 1  . glipiZIDE (GLUCOTROL) 10 MG tablet Take 1 tablet (10 mg total) by mouth 2 (two) times daily before a meal. 60 tablet 5  . glucose blood (ONE TOUCH ULTRA TEST) test strip USE AS DIRECTED 100 each 1  . glucose blood test strip Check sugars twice daily,ascensia contour micro fill test strips, Dx 250.00 100 each 5  . JANUVIA 50 MG tablet TAKE 1 TABLET DAILY 90 tablet 1  . levothyroxine (SYNTHROID, LEVOTHROID) 88 MCG tablet TAKE 1 TABLET EVERY DAY ON EMPTY STOMACHWITH A GLASS OF WATER AT LEAST 30-60 MINBEFORE  BREAKFAST 90 tablet 1  . losartan (COZAAR) 50 MG tablet TAKE ONE TABLET BY MOUTH DAILY 90 tablet 1  . metFORMIN (GLUCOPHAGE-XR) 500 MG 24 hr tablet Take 1 tablet (500 mg total) by mouth daily with breakfast. 90 tablet 1  . ONE TOUCH ULTRA TEST test strip USE AS DIRECTED 100 each 1  . ONE TOUCH ULTRA TEST test strip USE AS DIRECTED 100 each 1  . ONE TOUCH ULTRA TEST test strip USE AS DIRECTED 100 each 12   No facility-administered medications prior to visit.     Review of Systems  Patient denies headache, fevers, malaise, unintentional weight loss, skin rash, eye pain, sinus congestion and sinus pain, sore throat, dysphagia,  hemoptysis , cough, dyspnea, wheezing, chest pain, palpitations, orthopnea, edema, abdominal pain, nausea, melena, diarrhea, constipation, flank pain, dysuria, hematuria, urinary  Frequency, nocturia, numbness, tingling, seizures,  Focal weakness, Loss of consciousness,  Tremor, insomnia, depression, anxiety, and suicidal ideation.     Objective:  BP 122/68   Pulse 61   Temp 98 F (36.7 C) (Oral)   Ht 5\' 3"  (1.6 m)   Wt 150 lb 12.8 oz (68.4 kg)   SpO2 97%   BMI 26.71 kg/m   Physical Exam   .  General appearance: alert, cooperative and appears stated age Ears: normal TM's and external ear canals both ears Throat: lips, mucosa, and tongue normal; teeth and gums normal Neck: no adenopathy, no carotid bruit, supple, symmetrical, trachea midline and thyroid not enlarged, symmetric, no tenderness/mass/nodules Back: symmetric, no curvature. ROM normal. No CVA tenderness. Lungs: clear to auscultation bilaterally Heart: regular rate and rhythm, S1, S2 normal, no murmur, click, rub or gallop Abdomen: soft, non-tender; bowel sounds normal; no masses,  no organomegaly Pulses: 2+ and symmetric Skin: Skin color, texture, turgor normal. No rashes or lesions Lymph nodes: Cervical, supraclavicular, and axillary nodes normal.    Assessment & Plan:   Problem List Items  Addressed This Visit    DM type 2, controlled, with complication ([place])    Not at goal on januvia 50 mg and glipizide 10 mg bid.  Patient had stopped taking metformin for unclear reasons,  So she was advised to resume metformin and submit readings in a few weeks  Will increase januvia to 100 mg  If fastings are not at goal at that time  Lab Results  Component Value Date   HGBA1C 7.1 (H) 08/22/2018   Lab Results  Component Value Date   MICROALBUR <0.7 08/22/2018         Eczema of hand   Essential hypertension    Well controlled on current regimen. Renal function stable, no changes today.  Lab Results  Component Value Date   CREATININE 0.79 08/22/2018   Lab Results  Component Value Date   NA 138 08/22/2018   K 4.7 08/22/2018   CL 102 08/22/2018   CO2 30 08/22/2018         Hyperlipidemia associated with type 2 diabetes mellitus ([place])    Tolerating a  high potency statin.  liver enzymes are normal,  No changes today.    Lab Results  Component Value Date   CHOL 161 08/22/2018   HDL 56.30 08/22/2018   LDLCALC 82 08/22/2018   LDLDIRECT 87.0 02/19/2018   TRIG 114.0 08/22/2018   CHOLHDL 3 08/22/2018   Lab Results  Component Value Date   ALT 13 08/22/2018   AST 11 08/22/2018   ALKPHOS 98 08/22/2018   BILITOT 0.8 08/22/2018                        Knee pain, chronic    Secondary to DJD advanced. S/p I/A injection by [facility] .  meloxicam and tylenol prescribbed.       Relevant Medications   meloxicam (MOBIC) 7.5 MG tablet   Routine general medical examination at a health care facility    age appropriate education and counseling updated, referrals for preventative services and immunizations addressed, dietary and smoking counseling addressed, most recent labs reviewed.  I have personally reviewed and have noted:  1) the patient's medical and social history 2) The pt's use of alcohol, tobacco, and illicit drugs 3) The patient's current  medications and supplements 4) Functional ability including ADL's, fall risk, home safety risk, hearing and visual impairment 5) Diet and physical activities 6) Evidence for depression or mood disorder 7) The patient's height, weight, and BMI have been recorded in the chart  I have made referrals, and provided counseling and education based on review of the above         I am having Ivin Booty B. Kubin start on meloxicam and Zoster Vaccine Adjuvanted. I am also having her maintain her calcium-vitamin D, aspirin, glucose blood,  GE100 BLOOD GLUCOSE TEST, losartan, glucose blood, ONE TOUCH ULTRA TEST, ALPRAZolam, glipiZIDE, atorvastatin, ONE TOUCH ULTRA TEST, ONE TOUCH ULTRA TEST, JANUVIA, metFORMIN, and levothyroxine.  Meds ordered this encounter  Medications  . meloxicam (MOBIC) 7.5 MG tablet    Sig: Take 1 tablet (7.5 mg total) by mouth daily.    Dispense:  90 tablet    Refill:  1  . Zoster Vaccine Adjuvanted [facility]) injection    Sig: Inject 0.5 mLs into the muscle once for 1 dose.    Dispense:  1 each    Refill:  1    There are no discontinued medications.  Follow-up: Return in about 3 months (around 12/01/2018) for follow up diabetes.   Crecencio Mc, MD

## 2018-09-01 NOTE — Patient Instructions (Addendum)
For your diabetes:  Resume Metformin XR 500 mg daily  At lunch   Continue Januvia at 50 mg daily  At lunch and glipizide 10 mg twice daily  Before breakfast and dinner    Drop me off your blood sugar  Readings in one month   For your knee pain:  You can take  meloxicam 7.5  Mg daily as your anti inflammatory    You can add up to 2000 mg of acetominophen (tylenol) every day safely  In divided doses ( 1000 mg every 12 hours.)  If you cannot exercise with this regimen because of pain,  I will recommend referral to [facility] your knee  for 15 minutes after activity   Use heat in the morning only      Health Maintenance for Postmenopausal Women Menopause is a normal process in which your reproductive ability comes to an end. This process happens gradually over a span of months to years, usually between the ages of 10 and 43. Menopause is complete when you have missed 12 consecutive menstrual periods. It is important to talk with your health care provider about some of the most common conditions that affect postmenopausal women, such as heart disease, cancer, and bone loss (osteoporosis). Adopting a healthy lifestyle and getting preventive care can help to promote your health and wellness. Those actions can also lower your chances of developing some of these common conditions. What should I know about menopause? During menopause, you may experience a number of symptoms, such as:  Moderate-to-severe hot flashes.  Night sweats.  Decrease in sex drive.  Mood swings.  Headaches.  Tiredness.  Irritability.  Memory problems.  Insomnia. Choosing to treat or not to treat menopausal changes is an individual decision that you make with your health care provider. What should I know about hormone replacement therapy and supplements? Hormone therapy products are effective for treating symptoms that are associated with menopause, such as hot flashes and night  sweats. Hormone replacement carries certain risks, especially as you become older. If you are thinking about using estrogen or estrogen with progestin treatments, discuss the benefits and risks with your health care provider. What should I know about heart disease and stroke? Heart disease, heart attack, and stroke become more likely as you age. This may be due, in part, to the hormonal changes that your body experiences during menopause. These can affect how your body processes dietary fats, triglycerides, and cholesterol. Heart attack and stroke are both medical emergencies. There are many things that you can do to help prevent heart disease and stroke:  Have your blood pressure checked at least every 1-2 years. High blood pressure causes heart disease and increases the risk of stroke.  If you are 70-37 years old, ask your health care provider if you should take aspirin to prevent a heart attack or a stroke.  Do not use any tobacco products, including cigarettes, chewing tobacco, or electronic cigarettes. If you need help quitting, ask your health care provider.  It is important to eat a healthy diet and maintain a healthy weight. ? Be sure to include plenty of vegetables, fruits, low-fat dairy products, and lean protein. ? Avoid eating foods that are high in solid fats, added sugars, or salt (sodium).  Get regular exercise. This is one of the most important things that you can do for your health. ? Try to exercise for at least 150 minutes each week. The type of exercise that  you do should increase your heart rate and make you sweat. This is known as moderate-intensity exercise. ? Try to do strengthening exercises at least twice each week. Do these in addition to the moderate-intensity exercise.  Know your numbers.Ask your health care provider to check your cholesterol and your blood glucose. Continue to have your blood tested as directed by your health care provider.  What should I know  about cancer screening? There are several types of cancer. Take the following steps to reduce your risk and to catch any cancer development as early as possible. Breast Cancer  Practice breast self-awareness. ? This means understanding how your breasts normally appear and feel. ? It also means doing regular breast self-exams. Let your health care provider know about any changes, no matter how small.  If you are 59 or older, have a clinician do a breast exam (clinical breast exam or CBE) every year. Depending on your age, family history, and medical history, it may be recommended that you also have a yearly breast X-ray (mammogram).  If you have a family history of breast cancer, talk with your health care provider about genetic screening.  If you are at high risk for breast cancer, talk with your health care provider about having an MRI and a mammogram every year.  Breast cancer (BRCA) gene test is recommended for women who have family members with BRCA-related cancers. Results of the assessment will determine the need for genetic counseling and BRCA1 and for BRCA2 testing. BRCA-related cancers include these types: ? Breast. This occurs in males or females. ? Ovarian. ? Tubal. This may also be called fallopian tube cancer. ? Cancer of the abdominal or pelvic lining (peritoneal cancer). ? Prostate. ? Pancreatic. Cervical, Uterine, and Ovarian Cancer Your health care provider may recommend that you be screened regularly for cancer of the pelvic organs. These include your ovaries, uterus, and vagina. This screening involves a pelvic exam, which includes checking for microscopic changes to the surface of your cervix (Pap test).  For women ages 21-65, health care providers may recommend a pelvic exam and a Pap test every three years. For women ages 27-65, they may recommend the Pap test and pelvic exam, combined with testing for human papilloma virus (HPV), every five years. Some types of HPV  increase your risk of cervical cancer. Testing for HPV may also be done on women of any age who have unclear Pap test results.  Other health care providers may not recommend any screening for nonpregnant women who are considered low risk for pelvic cancer and have no symptoms. Ask your health care provider if a screening pelvic exam is right for you.  If you have had past treatment for cervical cancer or a condition that could lead to cancer, you need Pap tests and screening for cancer for at least 20 years after your treatment. If Pap tests have been discontinued for you, your risk factors (such as having a new sexual partner) need to be reassessed to determine if you should start having screenings again. Some women have medical problems that increase the chance of getting cervical cancer. In these cases, your health care provider may recommend that you have screening and Pap tests more often.  If you have a family history of uterine cancer or ovarian cancer, talk with your health care provider about genetic screening.  If you have vaginal bleeding after reaching menopause, tell your health care provider.  There are currently no reliable tests available to screen for  ovarian cancer. Lung Cancer Lung cancer screening is recommended for adults 76-47 years old who are at high risk for lung cancer because of a history of smoking. A yearly low-dose CT scan of the lungs is recommended if you:  Currently smoke.  Have a history of at least 30 pack-years of smoking and you currently smoke or have quit within the past 15 years. A pack-year is smoking an average of one pack of cigarettes per day for one year. Yearly screening should:  Continue until it has been 15 years since you quit.  Stop if you develop a health problem that would prevent you from having lung cancer treatment. Colorectal Cancer  This type of cancer can be detected and can often be prevented.  Routine colorectal cancer screening  usually begins at age 30 and continues through age 7.  If you have risk factors for colon cancer, your health care provider may recommend that you be screened at an earlier age.  If you have a family history of colorectal cancer, talk with your health care provider about genetic screening.  Your health care provider may also recommend using home test kits to check for hidden blood in your stool.  A small camera at the end of a tube can be used to examine your colon directly (sigmoidoscopy or colonoscopy). This is done to check for the earliest forms of colorectal cancer.  Direct examination of the colon should be repeated every 5-10 years until age 47. However, if early forms of precancerous polyps or small growths are found or if you have a family history or genetic risk for colorectal cancer, you may need to be screened more often. Skin Cancer  Check your skin from head to toe regularly.  Monitor any moles. Be sure to tell your health care provider: ? About any new moles or changes in moles, especially if there is a change in a mole's shape or color. ? If you have a mole that is larger than the size of a pencil eraser.  If any of your family members has a history of skin cancer, especially at a young age, talk with your health care provider about genetic screening.  Always use sunscreen. Apply sunscreen liberally and repeatedly throughout the day.  Whenever you are outside, protect yourself by wearing long sleeves, pants, a wide-brimmed hat, and sunglasses. What should I know about osteoporosis? Osteoporosis is a condition in which bone destruction happens more quickly than new bone creation. After menopause, you may be at an increased risk for osteoporosis. To help prevent osteoporosis or the bone fractures that can happen because of osteoporosis, the following is recommended:  If you are 30-73 years old, get at least 1,000 mg of calcium and at least 600 mg of vitamin D per day.  If  you are older than age 50 but younger than age 66, get at least 1,200 mg of calcium and at least 600 mg of vitamin D per day.  If you are older than age 51, get at least 1,200 mg of calcium and at least 800 mg of vitamin D per day. Smoking and excessive alcohol intake increase the risk of osteoporosis. Eat foods that are rich in calcium and vitamin D, and do weight-bearing exercises several times each week as directed by your health care provider. What should I know about how menopause affects my mental health? Depression may occur at any age, but it is more common as you become older. Common symptoms of depression include:  Low or sad mood.  Changes in sleep patterns.  Changes in appetite or eating patterns.  Feeling an overall lack of motivation or enjoyment of activities that you previously enjoyed.  Frequent crying spells. Talk with your health care provider if you think that you are experiencing depression. What should I know about immunizations? It is important that you get and maintain your immunizations. These include:  Tetanus, diphtheria, and pertussis (Tdap) booster vaccine.  Influenza every year before the flu season begins.  Pneumonia vaccine.  Shingles vaccine. Your health care provider may also recommend other immunizations. This information is not intended to replace advice given to you by your health care provider. Make sure you discuss any questions you have with your health care provider. Document Released: 10/12/2005 Document Revised: 03/09/2016 Document Reviewed: 05/24/2015 Elsevier Interactive Patient Education  2019 [employer].

## 2018-09-03 NOTE — Assessment & Plan Note (Signed)

## 2018-09-03 NOTE — Assessment & Plan Note (Signed)
Not at goal on januvia 50 mg and glipizide 10 mg bid.  Patient had stopped taking metformin for unclear reasons,  So she was advised to resume metformin and submit readings in a few weeks  Will increase januvia to 100 mg  If fastings are not at goal at that time  Lab Results  Component Value Date   HGBA1C 7.1 (H) 08/22/2018   Lab Results  Component Value Date   MICROALBUR <0.7 08/22/2018

## 2018-09-03 NOTE — Assessment & Plan Note (Signed)
Tolerating a  high potency statin.  liver enzymes are normal,  No changes today.    Lab Results  Component Value Date   CHOL 161 08/22/2018   HDL 56.30 08/22/2018   LDLCALC 82 08/22/2018   LDLDIRECT 87.0 02/19/2018   TRIG 114.0 08/22/2018   CHOLHDL 3 08/22/2018   Lab Results  Component Value Date   ALT 13 08/22/2018   AST 11 08/22/2018   ALKPHOS 98 08/22/2018   BILITOT 0.8 08/22/2018

## 2018-09-03 NOTE — Assessment & Plan Note (Signed)
Well controlled on current regimen. Renal function stable, no changes today.  Lab Results  Component Value Date   CREATININE 0.79 08/22/2018   Lab Results  Component Value Date   NA 138 08/22/2018   K 4.7 08/22/2018   CL 102 08/22/2018   CO2 30 08/22/2018

## 2018-09-03 NOTE — Assessment & Plan Note (Signed)
Secondary to DJD advanced. S/p I/A injection by [facility] .  meloxicam and tylenol prescribbed.

## 2018-09-19 DIAGNOSIS — R69 Illness, unspecified: Secondary | ICD-10-CM | POA: Diagnosis not present

## 2018-10-06 ENCOUNTER — Telehealth: Payer: Self-pay | Admitting: Internal Medicine

## 2018-10-06 NOTE — Telephone Encounter (Signed)
Placed in yellow results folder.  °

## 2018-10-06 NOTE — Telephone Encounter (Signed)
Pt dropped off blood sugar report. Envelope is in Dr. Lupita Dawn folder in front. Thanks

## 2018-10-10 MED ORDER — SITAGLIPTIN PHOSPHATE 100 MG PO TABS: 100.0000 mg | ORAL_TABLET | Freq: Every day | ORAL | 1 refills | Status: DC

## 2018-10-10 NOTE — Telephone Encounter (Signed)
Spoke with pt and she stated that she is going to call the company on Monday, then she would give [place] a call back.

## 2018-10-10 NOTE — Addendum Note (Signed)
Addended by: Crecencio Mc on: 10/10/2018 01:01 PM   Modules accepted: Orders

## 2018-10-10 NOTE — Telephone Encounter (Signed)
Blood sugars reviewed.  Please ask her to increase her januvia to 100 mg daily and continue her other medications.  I will update pharmacy

## 2018-10-10 NOTE — Telephone Encounter (Signed)
Spoke with pt and she stated that she will increase her dose to 100mg  but that she will not get the rx from the pharmacy due to it costing $400 a month. Pt stated that she gets this medication through the [employer] and it doesn't cost her anything. Pt stated that she is going to reach out to the company and see how we need to go about letting them know that the dose has been increased.

## 2018-10-10 NOTE — Telephone Encounter (Signed)
I would NEVER want her to spend $400 on one medication when we have other options !  Tell her if she can't get it through the other company to let me know

## 2018-10-13 NOTE — Telephone Encounter (Signed)
Yes, she should continue taking 50 mg DAILY OF  [place] until she can get the 100 mg dose.  Do not increase

## 2018-10-13 NOTE — Telephone Encounter (Signed)
Spoke with pt and informed her that she needs to continue taking the 50mg  once daily of the Januvia until she gets the 100mg  tablets in the mail. Pt gave a verbal understanding.

## 2018-10-13 NOTE — Telephone Encounter (Signed)
Spoke with pt to get the telephone number from the company so we can get a new form to fill out with the increased dosage on it. While on the phone with the pt she started talking about her knee and how it was discussed at her last office visit that should call when she felt like getting the referral to Dr. Clydell Hakim office. The pt stated that she still isn't sure that she wants to do the referral now or not because she has not been taking the meloxicam on a daily basis. Pt stated that since she is still having some pain that she is going to try taking the meloxicam daily to see if that will help with the pain so she doesn't have to go see Dr. Marry Guan. Pt stated that if she gets no relief from the meloxicam she will give [place] a call back.

## 2018-10-13 NOTE — Telephone Encounter (Signed)
Pt called to give fax number for the [employer] Fax#[phone]  Pt would like to know if she should stay on the 50MG  of sitaGLIPtin (JANUVIA) until she gets the 100MG  from the [employer]. Stated she has 15 tablets left and would probably take a 50MG  tablet at lunchtime unless she hears from [place] before then. Please advise.

## 2018-10-14 ENCOUNTER — Other Ambulatory Visit: Payer: Self-pay | Admitting: Internal Medicine

## 2018-10-14 DIAGNOSIS — R69 Illness, unspecified: Secondary | ICD-10-CM | POA: Diagnosis not present

## 2018-10-14 MED ORDER — ATORVASTATIN CALCIUM 20 MG PO TABS: 20.0000 mg | ORAL_TABLET | Freq: Every day | ORAL | 0 refills | Status: DC

## 2018-10-14 NOTE — Telephone Encounter (Signed)
Copied from [place] [id number]. Topic: Quick Communication - Rx Refill/Question >> Oct 14, 2018 11:08 AM Bea Graff, NT wrote: Medication: atorvastatin (LIPITOR) 20 MG tablet  Has the patient contacted their pharmacy? Yes.   (Agent: If no, request that the patient contact the pharmacy for the refill.) (Agent: If yes, when and what did the pharmacy advise?)  Preferred Pharmacy (with phone number or street name): [id number] [address], [place] [phone] (Phone) [phone] (Fax)    Agent: Please be advised that RX refills may take up to 3 business days. We ask that you follow-up with your pharmacy.

## 2018-10-14 NOTE — Telephone Encounter (Signed)
Requested Prescriptions  Pending Prescriptions Disp Refills  . atorvastatin (LIPITOR) 20 MG tablet 90 tablet 0    Sig: Take 1 tablet (20 mg total) by mouth daily.     Cardiovascular:  Antilipid - Statins Passed - 10/14/2018 11:13 AM      Passed - Total Cholesterol in normal range and within 360 days    Cholesterol  Date Value Ref Range Status  08/22/2018 161 0 - 200 mg/dL Final    Comment:    ATP III Classification       Desirable:  < 200 mg/dL               Borderline High:  200 - 239 mg/dL          High:  > = 240 mg/dL         Passed - LDL in normal range and within 360 days    LDL Cholesterol  Date Value Ref Range Status  08/22/2018 82 0 - 99 mg/dL Final         Passed - HDL in normal range and within 360 days    HDL  Date Value Ref Range Status  08/22/2018 56.30 >39.00 mg/dL Final         Passed - Triglycerides in normal range and within 360 days    Triglycerides  Date Value Ref Range Status  08/22/2018 114.0 0.0 - 149.0 mg/dL Final    Comment:    Normal:  <150 mg/dLBorderline High:  150 - 199 mg/dL         Passed - Patient is not pregnant      Passed - Valid encounter within last 12 months    Recent Outpatient Visits          1 month ago Eczema of hand   [place], MD   7 months ago Hypothyroidism due to acquired atrophy of thyroid   [place] [facility] Crecencio Mc, MD   10 months ago Chronic pain of right knee   [facility] Crecencio Mc, MD   1 year ago DM type 2, controlled, with complication [facility])   [place] Crecencio Mc, MD   1 year ago Osteoporosis, post-menopausal   [facility], [place], MD      Future Appointments            In 1 month Derrel Nip, Aris Everts, MD [place], [place]   In 10 months O'Brien-Blaney, Bryson Corona, LPN [place], [place]   In 10 months Derrel Nip, Aris Everts, MD [facility], [facility]

## 2018-11-10 DIAGNOSIS — R69 Illness, unspecified: Secondary | ICD-10-CM | POA: Diagnosis not present

## 2018-11-12 ENCOUNTER — Other Ambulatory Visit: Payer: Self-pay | Admitting: Internal Medicine

## 2018-11-19 MED ORDER — PIOGLITAZONE HCL 15 MG PO TABS: 15.0000 mg | ORAL_TABLET | Freq: Every day | ORAL | 2 refills | Status: DC

## 2018-11-19 NOTE — Telephone Encounter (Signed)
Pt states that she still has not received the Januiva through the financial assistance program and her blood sugar is not staying down. Pt states she may need another medication called in for her to take along with her metformin. Please advise.

## 2018-11-19 NOTE — Telephone Encounter (Signed)
Spoke with pt to let her know that she would need to call the [employer] to let them know that she still has not received her medication. The pt stated that she called them this morning and they are mailing her some more paperwork that needs to be completed before they can send her the rx. The pt stated that she has been without the [place] since 10/29/2018, she is still taking the metformin. The pt is wanting to know if she needs to take something else that wouldn't be so expensive while she waits on the Januvia to come in. She stated that in the mornings her BS is running around 118 but that is spikes up over 200 sometimes in the evening. The pt stated that she can mix some apple cider vinegar, honey and some other things together and that seems to bring her BS back down. So she has been trying to manage it the best she can without the Januvia.

## 2018-11-19 NOTE — Telephone Encounter (Signed)
I have called in generic actos 15 mg to take once daily.  Make sure she is still taking glipizide 10 mg twice daily

## 2018-11-19 NOTE — Addendum Note (Signed)
Addended by: Crecencio Mc on: 11/19/2018 07:43 PM   Modules accepted: Orders

## 2018-11-20 NOTE — Telephone Encounter (Signed)
Spoke with pt to let her know that generic Actos has been sent in for her to take instead of the [place]. Pt stated that she is taking the Glipizide 10mg  BID as well as the Metformin 500mg  once daily.

## 2018-12-03 ENCOUNTER — Telehealth: Payer: Self-pay

## 2018-12-03 DIAGNOSIS — E1169 Type 2 diabetes mellitus with other specified complication: Secondary | ICD-10-CM

## 2018-12-03 DIAGNOSIS — E118 Type 2 diabetes mellitus with unspecified complications: Secondary | ICD-10-CM

## 2018-12-03 DIAGNOSIS — E785 Hyperlipidemia, unspecified: Principal | ICD-10-CM

## 2018-12-03 NOTE — Telephone Encounter (Signed)
Labs ordered for lab appt on Friday.

## 2018-12-05 ENCOUNTER — Other Ambulatory Visit (INDEPENDENT_AMBULATORY_CARE_PROVIDER_SITE_OTHER): Payer: Medicare HMO

## 2018-12-05 ENCOUNTER — Other Ambulatory Visit: Payer: Self-pay

## 2018-12-05 DIAGNOSIS — E785 Hyperlipidemia, unspecified: Secondary | ICD-10-CM | POA: Diagnosis not present

## 2018-12-05 DIAGNOSIS — E118 Type 2 diabetes mellitus with unspecified complications: Secondary | ICD-10-CM

## 2018-12-05 DIAGNOSIS — E1169 Type 2 diabetes mellitus with other specified complication: Secondary | ICD-10-CM | POA: Diagnosis not present

## 2018-12-05 LAB — COMPREHENSIVE METABOLIC PANEL WITH GFR
ALT: 13 U/L (ref 0–35)
AST: 13 U/L (ref 0–37)
Albumin: 4.3 g/dL (ref 3.5–5.2)
Alkaline Phosphatase: 101 U/L (ref 39–117)
BUN: 23 mg/dL (ref 6–23)
CO2: 29 meq/L (ref 19–32)
Calcium: 9.9 mg/dL (ref 8.4–10.5)
Chloride: 103 meq/L (ref 96–112)
Creatinine, Ser: 0.71 mg/dL (ref 0.40–1.20)
GFR: 79.78 mL/min
Glucose, Bld: 136 mg/dL — ABNORMAL HIGH (ref 70–99)
Potassium: 4.5 meq/L (ref 3.5–5.1)
Sodium: 139 meq/L (ref 135–145)
Total Bilirubin: 0.6 mg/dL (ref 0.2–1.2)
Total Protein: 6.8 g/dL (ref 6.0–8.3)

## 2018-12-05 LAB — LIPID PANEL
Cholesterol: 129 mg/dL (ref 0–200)
HDL: 47 mg/dL
LDL Cholesterol: 58 mg/dL (ref 0–99)
NonHDL: 82.22
Total CHOL/HDL Ratio: 3
Triglycerides: 120 mg/dL (ref 0.0–149.0)
VLDL: 24 mg/dL (ref 0.0–40.0)

## 2018-12-05 LAB — HEMOGLOBIN A1C: Hgb A1c MFr Bld: 6.6 % — ABNORMAL HIGH (ref 4.6–6.5)

## 2018-12-07 DIAGNOSIS — R69 Illness, unspecified: Secondary | ICD-10-CM | POA: Diagnosis not present

## 2018-12-08 ENCOUNTER — Ambulatory Visit (INDEPENDENT_AMBULATORY_CARE_PROVIDER_SITE_OTHER): Payer: Medicare HMO | Admitting: Internal Medicine

## 2018-12-08 DIAGNOSIS — E118 Type 2 diabetes mellitus with unspecified complications: Secondary | ICD-10-CM

## 2018-12-08 DIAGNOSIS — E034 Atrophy of thyroid (acquired): Secondary | ICD-10-CM | POA: Diagnosis not present

## 2018-12-08 DIAGNOSIS — E785 Hyperlipidemia, unspecified: Secondary | ICD-10-CM

## 2018-12-08 DIAGNOSIS — E1169 Type 2 diabetes mellitus with other specified complication: Secondary | ICD-10-CM

## 2018-12-08 NOTE — Assessment & Plan Note (Signed)
Tolerating change from Januvia to Actos due to OOP cost of Januvia reported to be $431/90 days (but only $42/month) .  No adverse effects currently at 15 mg daily dose.  Advised to submit BS readings in 2 weeks.  Continue statin , ASA and ARB.    Lab Results  Component Value Date   HGBA1C 6.6 (H) 12/05/2018   Lab Results  Component Value Date   MICROALBUR <0.7 08/22/2018

## 2018-12-08 NOTE — Assessment & Plan Note (Signed)
Tolerating a  high potency statin.  liver enzymes are normal,  No changes today.    Lab Results  Component Value Date   CHOL 129 12/05/2018   HDL 47.00 12/05/2018   LDLCALC 58 12/05/2018   LDLDIRECT 87.0 02/19/2018   TRIG 120.0 12/05/2018   CHOLHDL 3 12/05/2018   Lab Results  Component Value Date   ALT 13 12/05/2018   AST 13 12/05/2018   ALKPHOS 101 12/05/2018   BILITOT 0.6 12/05/2018

## 2018-12-08 NOTE — Progress Notes (Signed)
Virtual Visit via Telephone Note  I connected with [name] on 12/08/18 at  9:30 AM EDT by telephone and verified that I am speaking with the correct person using two identifiers.   I discussed the limitations, risks, security and privacy concerns of performing an evaluation and management service by telephone and the availability of in person appointments. I also discussed with the patient that there may be a patient responsible charge related to this service. The patient expressed understanding and agreed to proceed.   History of Present Illness:  FOLLOW UP ON TYPE 2 DM,  ANXIETY,  ETC  "'I'm doing fine:  HAS gained 1 lb after losing 3 lbs initially,  using a mask to work outside in the yard during pollen season  and taking antihistamine prn .    1) 3 month follow up on diabetes.  Patient has no complaints today.  Patient is following a low glycemic index diet and taking all prescribed medications regularly without side effects.  Fasting sugars have been under less than 140 most of the time and post prandials have been under 160 except on rare occasions. Patient is exercising about 3 times per week and intentionally trying to lose weight .  Patient has had an eye exam in the last 12 months and checks feet regularly for signs of infection.  Patient does not walk barefoot outside,  And denies an numbness tingling or burning in feet. Patient is up to date on all recommended vaccinationsow taking Actos since March 18 due to cost of Januvia for 90 days being $43. Has not noticed any fluid retention or other signs of heart failure    Labs reviewed.  Observations/Objective:  General appearance: alert, cooperative and articulate.  No signs of being in distress  Lungs: not short of breath ,  No cough, speaking in full sentences  Psych: affect normal,dspeech is articulate and non pressured .  Denies suicidal thoughts   Assessment and Plan:  Hyperlipidemia associated with type 2 diabetes  mellitus Tolerating a  high potency statin.  liver enzymes are normal,  No changes today.    Lab Results  Component Value Date   CHOL 129 12/05/2018   HDL 47.00 12/05/2018   LDLCALC 58 12/05/2018   LDLDIRECT 87.0 02/19/2018   TRIG 120.0 12/05/2018   CHOLHDL 3 12/05/2018   Lab Results  Component Value Date   ALT 13 12/05/2018   AST 13 12/05/2018   ALKPHOS 101 12/05/2018   BILITOT 0.6 12/05/2018                    Hypothyroidism Thyroid function was WNL  In December on current dose.  No current changes needed.   Lab Results  Component Value Date   TSH 1.39 08/22/2018     DM type 2, controlled, with complication Tolerating change from Januvia to Actos due to OOP cost of Januvia reported to be $431/90 days (but only $42/month) .  No adverse effects currently at 15 mg daily dose.  Advised to submit BS readings in 2 weeks.  Continue statin , ASA and ARB.    Lab Results  Component Value Date   HGBA1C 6.6 (H) 12/05/2018   Lab Results  Component Value Date   MICROALBUR <0.7 08/22/2018       Updated Medication List Outpatient Encounter Medications as of 12/08/2018  Medication Sig  . ALPRAZolam (XANAX) 0.25 MG tablet TAKE ONE TABLET BY MOUTH EVERY 6 HOURS AS NEEDED FOR ANXIETY  .  aspirin 81 MG tablet Take 81 mg by mouth daily.  Marland Kitchen atorvastatin (LIPITOR) 20 MG tablet Take 1 tablet (20 mg total) by mouth daily.  . calcium-vitamin D (OSCAL WITH D) 500-200 MG-UNIT per tablet Take 2 tablets by mouth daily. Reported on 03/21/2016  . GE100 BLOOD GLUCOSE TEST test strip CHECK TWICE A DAY  . glipiZIDE (GLUCOTROL) 10 MG tablet TAKE 1 TABLET BY MOUTH TWICE A DAY BEFORE A MEAL  . glucose blood (ONE TOUCH ULTRA TEST) test strip USE AS DIRECTED  . glucose blood test strip Check sugars twice daily,[employer] contour micro fill test strips, Dx 250.00  . levothyroxine (SYNTHROID, LEVOTHROID) 88 MCG tablet TAKE 1 TABLET EVERY DAY ON EMPTY STOMACHWITH A GLASS OF WATER AT LEAST 30-60  MINBEFORE BREAKFAST  . losartan (COZAAR) 50 MG tablet TAKE ONE TABLET BY MOUTH DAILY  . meloxicam (MOBIC) 7.5 MG tablet Take 1 tablet (7.5 mg total) by mouth daily.  . metFORMIN (GLUCOPHAGE-XR) 500 MG 24 hr tablet Take 1 tablet (500 mg total) by mouth daily with breakfast.  . ONE TOUCH ULTRA TEST test strip USE AS DIRECTED  . ONE TOUCH ULTRA TEST test strip USE AS DIRECTED  . ONE TOUCH ULTRA TEST test strip USE AS DIRECTED  . pioglitazone (ACTOS) 15 MG tablet Take 1 tablet (15 mg total) by mouth daily.  . [DISCONTINUED] lisinopril (PRINIVIL,ZESTRIL) 10 MG tablet Take 1 tablet (10 mg total) by mouth daily.  . [DISCONTINUED] ranitidine (ZANTAC) 150 MG capsule Take 150 mg by mouth 2 (two) times daily.    . [DISCONTINUED] sitaGLIPtin (JANUVIA) 100 MG tablet Take 1 tablet (100 mg total) by mouth daily. (Patient not taking: Reported on 12/08/2018)   No facility-administered encounter medications on file as of 12/08/2018.      Follow Up Instructions:    I discussed the assessment and treatment plan with the patient. The patient was provided an opportunity to ask questions and all were answered. The patient agreed with the plan and demonstrated an understanding of the instructions.   The patient was advised to call back or seek an in-person evaluation if the symptoms worsen or if the condition fails to improve as anticipated.  I provided  25 minutes of non-face-to-face time during this encounter.   Crecencio Mc, MD

## 2018-12-08 NOTE — Assessment & Plan Note (Signed)
Thyroid function was WNL  In December on current dose.  No current changes needed.   Lab Results  Component Value Date   TSH 1.39 08/22/2018

## 2018-12-23 ENCOUNTER — Telehealth: Payer: Self-pay | Admitting: Internal Medicine

## 2018-12-23 DIAGNOSIS — I10 Essential (primary) hypertension: Secondary | ICD-10-CM

## 2018-12-23 DIAGNOSIS — E1169 Type 2 diabetes mellitus with other specified complication: Secondary | ICD-10-CM

## 2018-12-23 DIAGNOSIS — E785 Hyperlipidemia, unspecified: Secondary | ICD-10-CM

## 2018-12-23 DIAGNOSIS — E034 Atrophy of thyroid (acquired): Secondary | ICD-10-CM

## 2018-12-23 DIAGNOSIS — E118 Type 2 diabetes mellitus with unspecified complications: Secondary | ICD-10-CM

## 2018-12-23 NOTE — Telephone Encounter (Signed)
Pt drop off her blood sugar readings. Its in color folder up front. Thank you!

## 2018-12-23 NOTE — Telephone Encounter (Signed)
Placed in yellow results folder.  °

## 2018-12-24 NOTE — Telephone Encounter (Signed)
LMTCB. PEC may speak with pt.  

## 2018-12-24 NOTE — Telephone Encounter (Signed)
Pt. Given Dr. Lupita Dawn message. Verbalizes understanding. Pt. Made an appointment for July 22 with Dr. Derrel Nip. Is this ok and does she need to have lab work prior to visit, maybe that morning. Please advise pt.

## 2018-12-24 NOTE — Telephone Encounter (Signed)
Blood sugar log received and reviewed,  Continue actos at 15 mg dose  DO NOT INCREASE dose.

## 2018-12-25 NOTE — Telephone Encounter (Signed)
I have ordered CMP, A1c, lipid and TSh for upcoming lab appt in July. Is there anything else that needs to be added?

## 2018-12-29 ENCOUNTER — Other Ambulatory Visit: Payer: Self-pay | Admitting: Internal Medicine

## 2018-12-29 MED ORDER — ALPRAZOLAM 0.25 MG PO TABS: 0.2500 mg | ORAL_TABLET | Freq: Every evening | ORAL | 5 refills | Status: DC | PRN

## 2018-12-29 NOTE — Telephone Encounter (Signed)
Last OV 12/08/18 last refill 10/22/17?

## 2018-12-29 NOTE — Progress Notes (Signed)
Alprazolam has been refilled  

## 2018-12-30 DIAGNOSIS — R69 Illness, unspecified: Secondary | ICD-10-CM | POA: Diagnosis not present

## 2019-01-25 DIAGNOSIS — R69 Illness, unspecified: Secondary | ICD-10-CM | POA: Diagnosis not present

## 2019-02-05 ENCOUNTER — Telehealth: Payer: Self-pay | Admitting: Internal Medicine

## 2019-02-05 NOTE — Telephone Encounter (Signed)
Copied from [place] [id number]. Topic: Quick Communication - Rx Refill/Question >> Feb 05, 2019  8:43 AM Rainey Pines A wrote: Medication: atorvastatin (LIPITOR) 20 MG tablet (90 day supply) (Patient requesting this medication be sent to her new pharmacy.)  Has the patient contacted their pharmacy? {Yes (Agent: If no, request that the patient contact the pharmacy for the refill.) (Agent: If yes, when and what did the pharmacy advise?)Contact PCP  Preferred Pharmacy (with phone number or street name): [id number] [address], [place] [phone] (Phone) [phone] (Fax)    Agent: Please be advised that RX refills may take up to 3 business days. We ask that you follow-up with your pharmacy.

## 2019-02-06 MED ORDER — ATORVASTATIN CALCIUM 20 MG PO TABS: 20.0000 mg | ORAL_TABLET | Freq: Every day | ORAL | 1 refills | Status: DC

## 2019-02-06 NOTE — Telephone Encounter (Signed)
Medication has been refilled.

## 2019-02-12 ENCOUNTER — Other Ambulatory Visit: Payer: Self-pay | Admitting: Internal Medicine

## 2019-02-14 ENCOUNTER — Other Ambulatory Visit: Payer: Self-pay | Admitting: Internal Medicine

## 2019-02-16 ENCOUNTER — Other Ambulatory Visit: Payer: Self-pay | Admitting: Internal Medicine

## 2019-02-16 ENCOUNTER — Telehealth: Payer: Self-pay | Admitting: Internal Medicine

## 2019-02-16 MED ORDER — METFORMIN HCL 500 MG PO TABS: 500.0000 mg | ORAL_TABLET | Freq: Two times a day (BID) | ORAL | 3 refills | Status: DC

## 2019-02-16 NOTE — Telephone Encounter (Signed)
Patient needs to be   advised to stop taking Metformin XR in accordance with the recent recall of the drug due to unacceptable levels of [place] found in multiple lots from multiple pharmaceutical companies   Regular release metformin  Has not been found to have the same problem and has been sent in its place.  Yes, it is ok to take or I would not have sent it. If it causes nausea or diarrhea that does not resolve after a few days,  Patient can stop the medication and notify me.

## 2019-02-16 NOTE — Telephone Encounter (Signed)
Spoke with pt and informed her of Dr. Lupita Dawn message below, that she needs to stop taking the Metformin XR, why and the medication she needs to start taking in its place. Pt stated that she took this medication 4 years ago and it caused her to have diarrhea. Pt stated that since it was 4 years ago she would try it again and let us know if the diarrhea started again.

## 2019-02-16 NOTE — Telephone Encounter (Signed)
Refill request for metformin extended release.

## 2019-02-25 DIAGNOSIS — R69 Illness, unspecified: Secondary | ICD-10-CM | POA: Diagnosis not present

## 2019-03-20 ENCOUNTER — Other Ambulatory Visit (INDEPENDENT_AMBULATORY_CARE_PROVIDER_SITE_OTHER): Payer: Medicare HMO

## 2019-03-20 ENCOUNTER — Other Ambulatory Visit: Payer: Self-pay

## 2019-03-20 DIAGNOSIS — E1169 Type 2 diabetes mellitus with other specified complication: Secondary | ICD-10-CM | POA: Diagnosis not present

## 2019-03-20 DIAGNOSIS — E034 Atrophy of thyroid (acquired): Secondary | ICD-10-CM | POA: Diagnosis not present

## 2019-03-20 DIAGNOSIS — E118 Type 2 diabetes mellitus with unspecified complications: Secondary | ICD-10-CM | POA: Diagnosis not present

## 2019-03-20 DIAGNOSIS — E785 Hyperlipidemia, unspecified: Secondary | ICD-10-CM

## 2019-03-20 DIAGNOSIS — I10 Essential (primary) hypertension: Secondary | ICD-10-CM

## 2019-03-20 LAB — COMPREHENSIVE METABOLIC PANEL WITH GFR
ALT: 11 U/L (ref 0–35)
AST: 12 U/L (ref 0–37)
Albumin: 4.2 g/dL (ref 3.5–5.2)
Alkaline Phosphatase: 92 U/L (ref 39–117)
BUN: 22 mg/dL (ref 6–23)
CO2: 32 meq/L (ref 19–32)
Calcium: 9.6 mg/dL (ref 8.4–10.5)
Chloride: 105 meq/L (ref 96–112)
Creatinine, Ser: 0.72 mg/dL (ref 0.40–1.20)
GFR: 78.45 mL/min
Glucose, Bld: 136 mg/dL — ABNORMAL HIGH (ref 70–99)
Potassium: 4.8 meq/L (ref 3.5–5.1)
Sodium: 142 meq/L (ref 135–145)
Total Bilirubin: 0.7 mg/dL (ref 0.2–1.2)
Total Protein: 6.1 g/dL (ref 6.0–8.3)

## 2019-03-20 LAB — LIPID PANEL
Cholesterol: 129 mg/dL (ref 0–200)
HDL: 55.1 mg/dL
LDL Cholesterol: 52 mg/dL (ref 0–99)
NonHDL: 74.14
Total CHOL/HDL Ratio: 2
Triglycerides: 113 mg/dL (ref 0.0–149.0)
VLDL: 22.6 mg/dL (ref 0.0–40.0)

## 2019-03-20 LAB — TSH: TSH: 1.47 u[IU]/mL (ref 0.35–4.50)

## 2019-03-20 LAB — HEMOGLOBIN A1C: Hgb A1c MFr Bld: 6.2 % (ref 4.6–6.5)

## 2019-03-24 ENCOUNTER — Other Ambulatory Visit: Payer: Self-pay | Admitting: Internal Medicine

## 2019-03-25 ENCOUNTER — Ambulatory Visit (INDEPENDENT_AMBULATORY_CARE_PROVIDER_SITE_OTHER): Payer: Medicare HMO | Admitting: Internal Medicine

## 2019-03-25 ENCOUNTER — Other Ambulatory Visit: Payer: Self-pay

## 2019-03-25 ENCOUNTER — Encounter: Payer: Self-pay | Admitting: Internal Medicine

## 2019-03-25 DIAGNOSIS — E1169 Type 2 diabetes mellitus with other specified complication: Secondary | ICD-10-CM | POA: Diagnosis not present

## 2019-03-25 DIAGNOSIS — E785 Hyperlipidemia, unspecified: Secondary | ICD-10-CM

## 2019-03-25 DIAGNOSIS — E034 Atrophy of thyroid (acquired): Secondary | ICD-10-CM

## 2019-03-25 DIAGNOSIS — E118 Type 2 diabetes mellitus with unspecified complications: Secondary | ICD-10-CM

## 2019-03-25 NOTE — Progress Notes (Signed)
Telephone Note  This visit type was conducted due to national recommendations for restrictions regarding the COVID-19 pandemic (e.g. social distancing).  This format is felt to be most appropriate for this patient at this time.  All issues noted in this document were discussed and addressed.  No physical exam was performed (except for noted visual exam findings with Video Visits).   I connected with@ on 03/25/19 at 10:00 AM EDT by a video enabled telemedicine application or telephone and verified that I am speaking with the correct person using two identifiers. Location patient: home Location provider: work or home office Persons participating in the virtual visit: patient, provider  I discussed the limitations, risks, security and privacy concerns of performing an evaluation and management service by telephone and the availability of in person appointments. I also discussed with the patient that there may be a patient responsible charge related to this service. The patient expressed understanding and agreed to proceed.  Reason for visit: follow up on type 2 DM  HPI:  3 month follow up on diabetes.  Patient has no complaints today.  Patient is following a low glycemic index diet and taking all prescribed medications (including immediate release metformin ) regularly without side effects, including diarrhea.  Fasting sugars have been less than 120 most of the time and post prandials have been under 140 except on rare occasions. Patient is not exercising and has gained 5 lbs since her last visit.  Patient has had an eye exam in the last 12 months and checks feet regularly for signs of infection.  Patient does not walk barefoot outside,  And denies an numbness tingling or burning in feet. Patient is up to date on all recommended vaccinations   ROS: See pertinent positives and negatives per HPI.  Past Medical History:  Diagnosis Date  . Diabetes mellitus   . GERD (gastroesophageal reflux disease)    . Hyperlipidemia   . Hypertension   . Osteoporosis   . Positive colorectal cancer screening using Cologuard test 2019  . Thyroid disease     Past Surgical History:  Procedure Laterality Date  . BREAST CYST ASPIRATION Bilateral 25+ yrs ago  . CATARACT EXTRACTION W/PHACO Right 03/02/2015   Procedure: CATARACT EXTRACTION PHACO AND INTRAOCULAR LENS PLACEMENT ([place]) TORIC LENS;  Surgeon: Leandrew Koyanagi, MD;  Location: [place];  Service: Ophthalmology;  Laterality: Right;  TORIC  . CATARACT EXTRACTION W/PHACO Left 03/30/2015   Procedure: CATARACT EXTRACTION PHACO AND INTRAOCULAR LENS PLACEMENT ([facility]);  Surgeon: Leandrew Koyanagi, MD;  Location: [place];  Service: Ophthalmology;  Laterality: Left;  TORIC  DIABETIC - oral meds  . CERVICAL POLYPECTOMY    . COLONOSCOPY  01/2007   Dr Olevia Perches  . COLONOSCOPY WITH PROPOFOL N/A 10/23/2017   Procedure: COLONOSCOPY WITH PROPOFOL;  Surgeon: Robert Bellow, MD;  Location: [facility];  Service: Endoscopy;  Laterality: N/A;  . HUMERUS FRACTURE SURGERY    . VAGINAL DELIVERY    . WRIST FRACTURE SURGERY      Family History  Problem Relation Age of Onset  . Hypertension Mother   . Stroke Mother 64       hemorrhagic  . Heart disease Father   . Cancer Father        Lung CA,  died of AMI while in [place] getting Laetril  . Diabetes Brother   . Hypertension Brother   . Hypertension Son   . Heart disease Maternal Grandfather   . Cancer Maternal Grandfather   .  Breast cancer Maternal Grandmother 19  . Depression Sister     SOCIAL HX:  reports that she has never smoked. She has never used smokeless tobacco. She reports that she does not drink alcohol or use drugs.   Current Outpatient Medications:  .  ALPRAZolam (XANAX) 0.25 MG tablet, Take 1 tablet (0.25 mg total) by mouth at bedtime as needed. for anxiety, Disp: 30 tablet, Rfl: 5 .  aspirin 81 MG tablet, Take 81 mg by mouth daily., Disp: , Rfl:  .  atorvastatin  (LIPITOR) 20 MG tablet, Take 1 tablet (20 mg total) by mouth daily., Disp: 90 tablet, Rfl: 1 .  calcium-vitamin D (OSCAL WITH D) 500-200 MG-UNIT per tablet, Take 2 tablets by mouth daily. Reported on 03/21/2016, Disp: , Rfl:  .  GE100 BLOOD GLUCOSE TEST test strip, CHECK TWICE A DAY, Disp: 100 each, Rfl: 1 .  glipiZIDE (GLUCOTROL) 10 MG tablet, TAKE 1 TABLET BY MOUTH TWICE A DAY BEFORE A MEAL, Disp: 180 tablet, Rfl: 1 .  glucose blood (ONE TOUCH ULTRA TEST) test strip, USE AS DIRECTED, Disp: 100 each, Rfl: 1 .  glucose blood test strip, Check sugars twice daily,ascensia contour micro fill test strips, Dx 250.00, Disp: 100 each, Rfl: 5 .  levothyroxine (SYNTHROID, LEVOTHROID) 88 MCG tablet, TAKE 1 TABLET EVERY DAY ON EMPTY STOMACHWITH A GLASS OF WATER AT LEAST 30-60 MINBEFORE BREAKFAST, Disp: 90 tablet, Rfl: 1 .  losartan (COZAAR) 50 MG tablet, TAKE 1 TABLET BY MOUTH EVERY DAY, Disp: 90 tablet, Rfl: 1 .  meloxicam (MOBIC) 7.5 MG tablet, TAKE 1 TABLET BY MOUTH EVERY DAY, Disp: 90 tablet, Rfl: 0 .  metFORMIN (GLUCOPHAGE) 500 MG tablet, Take 1 tablet (500 mg total) by mouth 2 (two) times daily with a meal., Disp: 180 tablet, Rfl: 3 .  ONE TOUCH ULTRA TEST test strip, USE AS DIRECTED, Disp: 100 each, Rfl: 1 .  ONE TOUCH ULTRA TEST test strip, USE AS DIRECTED, Disp: 100 each, Rfl: 1 .  ONE TOUCH ULTRA TEST test strip, USE AS DIRECTED, Disp: 100 each, Rfl: 12 .  pioglitazone (ACTOS) 15 MG tablet, TAKE 1 TABLET BY MOUTH EVERY DAY, Disp: 90 tablet, Rfl: 0  EXAM:   General impression: alert, cooperative and articulate.  No signs of being in distress  Lungs: speech is fluent sentence length suggests that patient is not short of breath and not punctuated by cough, sneezing or sniffing. Marland Kitchen   Psych: affect normal.  speech is articulate and non pressured .  Denies suicidal thoughts   ASSESSMENT AND PLAN:     DM type 2, controlled, with complication Improved control since and tolerating change from Januvia  to Actos.  No adverse effects currently at 15 mg daily dose.  Also tolerating change to immediate release metformin .   Lab Results  Component Value Date   HGBA1C 6.2 03/20/2019   Lab Results  Component Value Date   MICROALBUR <0.7 08/22/2018     Lab Results  Component Value Date   ALT 11 03/20/2019   AST 12 03/20/2019   ALKPHOS 92 03/20/2019   BILITOT 0.7 03/20/2019     Hypothyroidism Thyroid function is WNL  In December on current dose.  No current changes needed.   Lab Results  Component Value Date   TSH 1.47 03/20/2019     Hyperlipidemia associated with type 2 diabetes mellitus Tolerating a  high potency statin.  liver enzymes are normal,  No changes today.    Lab Results  Component  Value Date   CHOL 129 03/20/2019   HDL 55.10 03/20/2019   LDLCALC 52 03/20/2019   LDLDIRECT 87.0 02/19/2018   TRIG 113.0 03/20/2019   CHOLHDL 2 03/20/2019   Lab Results  Component Value Date   ALT 11 03/20/2019   AST 12 03/20/2019   ALKPHOS 92 03/20/2019   BILITOT 0.7 03/20/2019                     Updated Medication List Outpatient Encounter Medications as of 03/25/2019  Medication Sig  . ALPRAZolam (XANAX) 0.25 MG tablet Take 1 tablet (0.25 mg total) by mouth at bedtime as needed. for anxiety  . aspirin 81 MG tablet Take 81 mg by mouth daily.  Marland Kitchen atorvastatin (LIPITOR) 20 MG tablet Take 1 tablet (20 mg total) by mouth daily.  . calcium-vitamin D (OSCAL WITH D) 500-200 MG-UNIT per tablet Take 2 tablets by mouth daily. Reported on 03/21/2016  . GE100 BLOOD GLUCOSE TEST test strip CHECK TWICE A DAY  . glipiZIDE (GLUCOTROL) 10 MG tablet TAKE 1 TABLET BY MOUTH TWICE A DAY BEFORE A MEAL  . glucose blood (ONE TOUCH ULTRA TEST) test strip USE AS DIRECTED  . glucose blood test strip Check sugars twice daily,[employer]ia contour micro fill test strips, Dx 250.00  . levothyroxine (SYNTHROID, LEVOTHROID) 88 MCG tablet TAKE 1 TABLET EVERY DAY ON EMPTY STOMACHWITH A GLASS OF  WATER AT LEAST 30-60 MINBEFORE BREAKFAST  . losartan (COZAAR) 50 MG tablet TAKE 1 TABLET BY MOUTH EVERY DAY  . meloxicam (MOBIC) 7.5 MG tablet TAKE 1 TABLET BY MOUTH EVERY DAY  . metFORMIN (GLUCOPHAGE) 500 MG tablet Take 1 tablet (500 mg total) by mouth 2 (two) times daily with a meal.  . ONE TOUCH ULTRA TEST test strip USE AS DIRECTED  . ONE TOUCH ULTRA TEST test strip USE AS DIRECTED  . ONE TOUCH ULTRA TEST test strip USE AS DIRECTED  . pioglitazone (ACTOS) 15 MG tablet TAKE 1 TABLET BY MOUTH EVERY DAY  . [DISCONTINUED] lisinopril (PRINIVIL,ZESTRIL) 10 MG tablet Take 1 tablet (10 mg total) by mouth daily.  . [DISCONTINUED] ranitidine (ZANTAC) 150 MG capsule Take 150 mg by mouth 2 (two) times daily.     No facility-administered encounter medications on file as of 03/25/2019.    I discussed the assessment and treatment plan with the patient. The patient was provided an opportunity to ask questions and all were answered. The patient agreed with the plan and demonstrated an understanding of the instructions.   The patient was advised to call back or seek an in-person evaluation if the symptoms worsen or if the condition fails to improve as anticipated.  I provided 25 minutes of non-face-to-face time during this encounter.   Megan Mc, MD

## 2019-03-26 DIAGNOSIS — E119 Type 2 diabetes mellitus without complications: Secondary | ICD-10-CM | POA: Diagnosis not present

## 2019-03-26 LAB — HM DIABETES EYE EXAM

## 2019-03-27 NOTE — Assessment & Plan Note (Signed)
Tolerating a  high potency statin.  liver enzymes are normal,  No changes today.    Lab Results  Component Value Date   CHOL 129 03/20/2019   HDL 55.10 03/20/2019   LDLCALC 52 03/20/2019   LDLDIRECT 87.0 02/19/2018   TRIG 113.0 03/20/2019   CHOLHDL 2 03/20/2019   Lab Results  Component Value Date   ALT 11 03/20/2019   AST 12 03/20/2019   ALKPHOS 92 03/20/2019   BILITOT 0.7 03/20/2019

## 2019-03-27 NOTE — Assessment & Plan Note (Addendum)
Improved control since and tolerating change from Januvia to Actos.  No adverse effects currently at 15 mg daily dose.  Also tolerating change to immediate release metformin .   Lab Results  Component Value Date   HGBA1C 6.2 03/20/2019   Lab Results  Component Value Date   MICROALBUR <0.7 08/22/2018     Lab Results  Component Value Date   ALT 11 03/20/2019   AST 12 03/20/2019   ALKPHOS 92 03/20/2019   BILITOT 0.7 03/20/2019

## 2019-03-27 NOTE — Assessment & Plan Note (Signed)
Thyroid function is WNL  In December on current dose.  No current changes needed.   Lab Results  Component Value Date   TSH 1.47 03/20/2019

## 2019-03-30 DIAGNOSIS — R69 Illness, unspecified: Secondary | ICD-10-CM | POA: Diagnosis not present

## 2019-04-09 ENCOUNTER — Encounter: Payer: Self-pay | Admitting: General Surgery

## 2019-05-02 DIAGNOSIS — R69 Illness, unspecified: Secondary | ICD-10-CM | POA: Diagnosis not present

## 2019-05-05 ENCOUNTER — Other Ambulatory Visit: Payer: Self-pay | Admitting: Internal Medicine

## 2019-05-08 ENCOUNTER — Other Ambulatory Visit: Payer: Self-pay | Admitting: Internal Medicine

## 2019-05-20 ENCOUNTER — Other Ambulatory Visit: Payer: Self-pay | Admitting: Internal Medicine

## 2019-05-20 NOTE — Telephone Encounter (Signed)
Patient requesting 90 day supply levothyroxine (SYNTHROID, LEVOTHROID) 88 MCG tablet , patient states she contacted the pharmacy on Monday and only has 2 pills left, please advise if Rx can be sent today, please advise    [id number] [address], [place] [phone] (Phone) [phone] (Fax)

## 2019-05-27 ENCOUNTER — Other Ambulatory Visit: Payer: Self-pay | Admitting: Internal Medicine

## 2019-05-27 DIAGNOSIS — Z1231 Encounter for screening mammogram for malignant neoplasm of breast: Secondary | ICD-10-CM

## 2019-05-28 ENCOUNTER — Telehealth: Payer: Self-pay | Admitting: Internal Medicine

## 2019-05-28 ENCOUNTER — Other Ambulatory Visit: Payer: Self-pay | Admitting: Internal Medicine

## 2019-05-28 NOTE — Telephone Encounter (Signed)
Patient stated that the Metformin she is on is giving her diarrhea really bad.  So she stopped the regular Metformin and started back on the slow release because she had some left. She is doing ok on it.  Please advise.

## 2019-05-28 NOTE — Telephone Encounter (Signed)
FYI

## 2019-05-28 NOTE — Telephone Encounter (Signed)
Patient was   advised to stop taking Metformin several weeks ago  in accordance with the recent recall of the drug due to unacceptable levels of [place] found in multiple lots from multiple pharmaceutical companies . This drug can cause cancer.   She may be taking one that was recalled,  So she will need to ask her pharmacist if she wants to continue the current bottle.  Otherwise I will send in a new prescription for the XR that has NOT BEEN RECALLED

## 2019-05-29 MED ORDER — METFORMIN HCL ER (MOD) 500 MG PO TB24: 500.0000 mg | ORAL_TABLET | Freq: Every day | ORAL | 1 refills | Status: DC

## 2019-05-29 NOTE — Telephone Encounter (Signed)
Pt stated that she is taking the Metformin ER 500mg  take 1 tablet by mouth daily because this one does not cause her to have diarrhea. Pt stated that she has no more refills on this medication.

## 2019-05-29 NOTE — Telephone Encounter (Signed)
Refill sent to pharmacy.   

## 2019-06-09 ENCOUNTER — Telehealth: Payer: Self-pay

## 2019-06-09 MED ORDER — METFORMIN HCL ER (MOD) 500 MG PO TB24: 500.0000 mg | ORAL_TABLET | Freq: Every day | ORAL | 1 refills | Status: DC

## 2019-06-09 NOTE — Telephone Encounter (Signed)
Copied from [place] [id number]. Topic: General - Inquiry >> Jun 09, 2019 10:28 AM Percell Belt A wrote: Reason for CRM: pt called in and states that her ins will cover the Metformin ER , she would like to know if Dr Derrel Nip could call this one in.  And she would like to know how to take it if this is the med that Dr Derrel Nip would like for her to take [place] [facility] [address]

## 2019-06-09 NOTE — Telephone Encounter (Signed)
PA for Metformin has been approved.

## 2019-06-09 NOTE — Telephone Encounter (Signed)
The metformin ER was sent in on sept 25.  I have sent it again.  One tablet daily with breakfast

## 2019-06-10 DIAGNOSIS — E118 Type 2 diabetes mellitus with unspecified complications: Secondary | ICD-10-CM

## 2019-06-10 NOTE — Telephone Encounter (Signed)
PA has been approved

## 2019-06-18 ENCOUNTER — Telehealth: Payer: Self-pay | Admitting: Internal Medicine

## 2019-06-18 DIAGNOSIS — R69 Illness, unspecified: Secondary | ICD-10-CM | POA: Diagnosis not present

## 2019-06-18 NOTE — Telephone Encounter (Signed)
Pharmacy called and stated that they will not cover medicationmetFORMIN (GLUMETZA) 500 MG (MOD) 24 hr tablet [id number] but they will cover XL . Please advise

## 2019-06-19 MED ORDER — METFORMIN HCL ER 500 MG PO TB24: 500.0000 mg | ORAL_TABLET | Freq: Every day | ORAL | 3 refills | Status: DC

## 2019-06-19 NOTE — Telephone Encounter (Signed)
ITS THE SAME DAMN MEDICATION .  I DID NOT SPECIFY NAME BRAND ONL.  I HAVE SENT IT AGAIN AS XR

## 2019-06-19 NOTE — Telephone Encounter (Signed)
[facility] to make sure that this was covered & squared away. I spoke with pharmacist who informed that a PA was done for [facility] & was approved, but patient still did not want to pay the copay. New script for XR that was sent is covered.

## 2019-07-13 ENCOUNTER — Other Ambulatory Visit: Payer: Self-pay | Admitting: Internal Medicine

## 2019-07-13 DIAGNOSIS — R69 Illness, unspecified: Secondary | ICD-10-CM | POA: Diagnosis not present

## 2019-07-13 NOTE — Telephone Encounter (Signed)
Refill sent.  Pt aware. 

## 2019-07-13 NOTE — Telephone Encounter (Signed)
Patient called in to check on status of refill. Patient states she only has one left. Please advise.

## 2019-08-10 DIAGNOSIS — R69 Illness, unspecified: Secondary | ICD-10-CM | POA: Diagnosis not present

## 2019-08-17 ENCOUNTER — Other Ambulatory Visit: Payer: Self-pay | Admitting: Internal Medicine

## 2019-08-22 ENCOUNTER — Telehealth: Payer: Self-pay | Admitting: *Deleted

## 2019-08-22 DIAGNOSIS — E118 Type 2 diabetes mellitus with unspecified complications: Secondary | ICD-10-CM

## 2019-08-22 NOTE — Telephone Encounter (Signed)
Non fasting diabetes labs ordered

## 2019-08-22 NOTE — Telephone Encounter (Signed)
Please place future orders for lab appt.  

## 2019-08-24 ENCOUNTER — Ambulatory Visit
Admission: RE | Admit: 2019-08-24 | Discharge: 2019-08-24 | Disposition: A | Discharge status: Home / Self Care | Payer: Medicare HMO | Source: Ambulatory Visit | Attending: Internal Medicine | Admitting: Internal Medicine

## 2019-08-24 ENCOUNTER — Other Ambulatory Visit (INDEPENDENT_AMBULATORY_CARE_PROVIDER_SITE_OTHER): Payer: Medicare HMO

## 2019-08-24 ENCOUNTER — Other Ambulatory Visit: Payer: Self-pay

## 2019-08-24 DIAGNOSIS — E118 Type 2 diabetes mellitus with unspecified complications: Secondary | ICD-10-CM

## 2019-08-24 DIAGNOSIS — Z1231 Encounter for screening mammogram for malignant neoplasm of breast: Secondary | ICD-10-CM | POA: Diagnosis present

## 2019-08-24 LAB — COMPREHENSIVE METABOLIC PANEL WITH GFR
ALT: 14 U/L (ref 0–35)
AST: 15 U/L (ref 0–37)
Albumin: 4.3 g/dL (ref 3.5–5.2)
Alkaline Phosphatase: 92 U/L (ref 39–117)
BUN: 29 mg/dL — ABNORMAL HIGH (ref 6–23)
CO2: 29 meq/L (ref 19–32)
Calcium: 9.8 mg/dL (ref 8.4–10.5)
Chloride: 105 meq/L (ref 96–112)
Creatinine, Ser: 0.75 mg/dL (ref 0.40–1.20)
GFR: 74.75 mL/min
Glucose, Bld: 130 mg/dL — ABNORMAL HIGH (ref 70–99)
Potassium: 4.4 meq/L (ref 3.5–5.1)
Sodium: 140 meq/L (ref 135–145)
Total Bilirubin: 0.5 mg/dL (ref 0.2–1.2)
Total Protein: 6.8 g/dL (ref 6.0–8.3)

## 2019-08-24 LAB — MICROALBUMIN / CREATININE URINE RATIO
Creatinine,U: 106.9 mg/dL
Microalb Creat Ratio: 7.2 mg/g (ref 0.0–30.0)
Microalb, Ur: 7.7 mg/dL — ABNORMAL HIGH (ref 0.0–1.9)

## 2019-08-24 LAB — HEMOGLOBIN A1C: Hgb A1c MFr Bld: 6 % (ref 4.6–6.5)

## 2019-08-25 ENCOUNTER — Other Ambulatory Visit: Payer: Self-pay | Admitting: Internal Medicine

## 2019-09-02 ENCOUNTER — Encounter: Payer: PPO | Admitting: Internal Medicine

## 2019-09-02 ENCOUNTER — Other Ambulatory Visit: Payer: Self-pay

## 2019-09-02 ENCOUNTER — Encounter: Payer: Self-pay | Admitting: Internal Medicine

## 2019-09-02 ENCOUNTER — Ambulatory Visit (INDEPENDENT_AMBULATORY_CARE_PROVIDER_SITE_OTHER): Payer: Medicare HMO | Admitting: Internal Medicine

## 2019-09-02 ENCOUNTER — Ambulatory Visit (INDEPENDENT_AMBULATORY_CARE_PROVIDER_SITE_OTHER): Payer: Medicare HMO

## 2019-09-02 VITALS — Ht 63.0 in | Wt 150.0 lb

## 2019-09-02 DIAGNOSIS — E118 Type 2 diabetes mellitus with unspecified complications: Secondary | ICD-10-CM

## 2019-09-02 DIAGNOSIS — I10 Essential (primary) hypertension: Secondary | ICD-10-CM | POA: Diagnosis not present

## 2019-09-02 DIAGNOSIS — Z0000 Encounter for general adult medical examination without abnormal findings: Secondary | ICD-10-CM | POA: Diagnosis not present

## 2019-09-02 DIAGNOSIS — E785 Hyperlipidemia, unspecified: Secondary | ICD-10-CM | POA: Diagnosis not present

## 2019-09-02 DIAGNOSIS — E1169 Type 2 diabetes mellitus with other specified complication: Secondary | ICD-10-CM

## 2019-09-02 MED ORDER — MELOXICAM 7.5 MG PO TABS: 7.5000 mg | ORAL_TABLET | Freq: Every day | ORAL | 1 refills | Status: DC

## 2019-09-02 NOTE — Patient Instructions (Addendum)
  Ms. Antill , Thank you for taking time to come for your Medicare Wellness Visit. I appreciate your ongoing commitment to your health goals. Please review the following plan we discussed and let me know if I can assist you in the future.   These are the goals we discussed: Goals    . Increase physical activity     Weight loss Walk for exercise (increase from 2 to 5 days)       This is a list of the screening recommended for you and due dates:  Health Maintenance  Topic Date Due  . Complete foot exam   02/22/2019  . Hemoglobin A1C  02/22/2020  . Eye exam for diabetics  03/25/2020  . Mammogram  08/23/2020  . Tetanus Vaccine  02/04/2024  . Flu Shot  Completed  . DEXA scan (bone density measurement)  Completed  . Pneumonia vaccines  Completed

## 2019-09-02 NOTE — Assessment & Plan Note (Signed)
Well controlled on current regimen. Renal function stable, no changes today.  Lab Results  Component Value Date   CREATININE 0.75 08/24/2019   Lab Results  Component Value Date   MICROALBUR 7.7 (H) 08/24/2019   MICROALBUR <0.7 08/22/2018

## 2019-09-02 NOTE — Progress Notes (Addendum)
Subjective:   [name] is a 78 y.o. female who presents for Medicare Annual (Subsequent) preventive examination.  Review of Systems:  No ROS.  Medicare Wellness Virtual Visit.  Visual/audio telehealth visit, UTA vital signs.   Wt/Ht provided by patient. See social history for additional risk factors.   Cardiac Risk Factors include: advanced age (>64men, >64 women);diabetes mellitus;hypertension     Objective:     Vitals: Ht 5\' 3"  (1.6 m)   Wt 150 lb (68 kg)   BMI 26.57 kg/m   Body mass index is 26.57 kg/m.  Advanced Directives 09/02/2019 04/15/2018 10/23/2017 04/10/2017 12/21/2015 03/30/2015  Does Patient Have a Medical Advance Directive? Yes Yes Yes Yes Yes No  Type of Paramedic of [place];Living will [place];Living will [place];Living will [place];Living will [place];Living will -  Does patient want to make changes to medical advance directive? No - Patient declined No - Patient declined - No - Patient declined - -  Copy of [place] in Chart? No - copy requested No - copy requested No - copy requested No - copy requested No - copy requested -  Would patient like information on creating a medical advance directive? - - - - - Yes - Scientist, clinical (histocompatibility and immunogenetics) given    Tobacco Social History   Tobacco Use  Smoking Status Never Smoker  Smokeless Tobacco Never Used     Counseling given: Not Answered   Clinical Intake:  Pre-visit preparation completed: Yes        Diabetes: Yes(Followed by pcp)  How often do you need to have someone help you when you read instructions, pamphlets, or other written materials from your doctor or pharmacy?: 1 - Never  Interpreter Needed?: No     Past Medical History:  Diagnosis Date  . Diabetes mellitus   . GERD (gastroesophageal reflux disease)   . Hyperlipidemia   . Hypertension   . Osteoporosis   .  Positive colorectal cancer screening using Cologuard test 2019  . Thyroid disease    Past Surgical History:  Procedure Laterality Date  . BREAST CYST ASPIRATION Bilateral 25+ yrs ago  . CATARACT EXTRACTION W/PHACO Right 03/02/2015   Procedure: CATARACT EXTRACTION PHACO AND INTRAOCULAR LENS PLACEMENT ([place]) TORIC LENS;  Surgeon: Leandrew Koyanagi, MD;  Location: [facility];  Service: Ophthalmology;  Laterality: Right;  TORIC  . CATARACT EXTRACTION W/PHACO Left 03/30/2015   Procedure: CATARACT EXTRACTION PHACO AND INTRAOCULAR LENS PLACEMENT ([facility]);  Surgeon: Leandrew Koyanagi, MD;  Location: [facility];  Service: Ophthalmology;  Laterality: Left;  TORIC  DIABETIC - oral meds  . CERVICAL POLYPECTOMY    . COLONOSCOPY  01/2007   Dr Olevia Perches  . COLONOSCOPY WITH PROPOFOL N/A 10/23/2017   Procedure: COLONOSCOPY WITH PROPOFOL;  Surgeon: Robert Bellow, MD;  Location: [facility];  Service: Endoscopy;  Laterality: N/A;  . HUMERUS FRACTURE SURGERY    . VAGINAL DELIVERY    . WRIST FRACTURE SURGERY     Family History  Problem Relation Age of Onset  . Hypertension Mother   . Stroke Mother 42       hemorrhagic  . Heart disease Father   . Cancer Father        Lung CA,  died of AMI while in [place] getting Laetril  . Diabetes Brother   . Hypertension Brother   . Hypertension Son   . Heart disease Maternal Grandfather   .  Cancer Maternal Grandfather   . Breast cancer Maternal Grandmother 23  . Depression Sister    Social History   Socioeconomic History  . Marital status: Widowed    Spouse name: Not on file  . Number of children: 2  . Years of education: Not on file  . Highest education level: Not on file  Occupational History  . Occupation: Doctor, hospital: RETIRED  Tobacco Use  . Smoking status: Never Smoker  . Smokeless tobacco: Never Used  Substance and Sexual Activity  . Alcohol use: No  . Drug use: No  . Sexual activity: Never  Other  Topics Concern  . Not on file  Social History Narrative   Widowed in spring of 2012   Lost brother also   Has living will   Son and daughter hold health care POA   Would accept resuscitation but no prolonged artificial ventilation   Not sure about feeding tube   Social Determinants of Health   Financial Resource Strain:   . Difficulty of Paying Living Expenses: Not on file  Food Insecurity:   . Worried About Charity fundraiser in the Last Year: Not on file  . Ran Out of Food in the Last Year: Not on file  Transportation Needs:   . Lack of Transportation (Medical): Not on file  . Lack of Transportation (Non-Medical): Not on file  Physical Activity:   . Days of Exercise per Week: Not on file  . Minutes of Exercise per Session: Not on file  Stress:   . Feeling of Stress : Not on file  Social Connections:   . Frequency of Communication with Friends and Family: Not on file  . Frequency of Social Gatherings with Friends and Family: Not on file  . Attends Religious Services: Not on file  . Active Member of Clubs or Organizations: Not on file  . Attends Archivist Meetings: Not on file  . Marital Status: Not on file    Outpatient Encounter Medications as of 09/02/2019  Medication Sig  . ALPRAZolam (XANAX) 0.25 MG tablet Take 1 tablet (0.25 mg total) by mouth at bedtime as needed. for anxiety  . aspirin 81 MG tablet Take 81 mg by mouth daily.  Marland Kitchen atorvastatin (LIPITOR) 20 MG tablet TAKE 1 TABLET BY MOUTH EVERY DAY  . calcium-vitamin D (OSCAL WITH D) 500-200 MG-UNIT per tablet Take 2 tablets by mouth daily. Reported on 03/21/2016  . GE100 BLOOD GLUCOSE TEST test strip CHECK TWICE A DAY  . glipiZIDE (GLUCOTROL) 10 MG tablet TAKE 1 TABLET BY MOUTH TWICE A DAY BEFORE A MEAL  . glucose blood (ONE TOUCH ULTRA TEST) test strip USE AS DIRECTED  . glucose blood test strip Check sugars twice daily,[employer] contour micro fill test strips, Dx 250.00  . levothyroxine (SYNTHROID) 88 MCG  tablet TAKE 1 TABLET BY MOUTH EVERY DAY ON EMPTY STOMACH WITH WATER AT LEAST 30-60 MINS BEFORE BREAKFAST  . losartan (COZAAR) 50 MG tablet TAKE 1 TABLET BY MOUTH EVERY DAY  . meloxicam (MOBIC) 7.5 MG tablet TAKE 1 TABLET BY MOUTH EVERY DAY  . metFORMIN (GLUCOPHAGE-XR) 500 MG 24 hr tablet Take 1 tablet (500 mg total) by mouth daily with breakfast.  . ONE TOUCH ULTRA TEST test strip USE AS DIRECTED  . ONE TOUCH ULTRA TEST test strip USE AS DIRECTED  . ONE TOUCH ULTRA TEST test strip USE AS DIRECTED  . ONETOUCH ULTRA test strip USE AS DIRECTED  . pioglitazone (ACTOS)  15 MG tablet TAKE 1 TABLET BY MOUTH EVERY DAY  . [DISCONTINUED] lisinopril (PRINIVIL,ZESTRIL) 10 MG tablet Take 1 tablet (10 mg total) by mouth daily.  . [DISCONTINUED] ranitidine (ZANTAC) 150 MG capsule Take 150 mg by mouth 2 (two) times daily.     No facility-administered encounter medications on file as of 09/02/2019.    Activities of Daily Living In your present state of health, do you have any difficulty performing the following activities: 09/02/2019  Hearing? N  Vision? N  Difficulty concentrating or making decisions? N  Walking or climbing stairs? N  Dressing or bathing? N  Doing errands, shopping? N  Preparing Food and eating ? N  Using the Toilet? N  In the past six months, have you accidently leaked urine? N  Do you have problems with loss of bowel control? N  Managing your Medications? N  Managing your Finances? N  Housekeeping or managing your Housekeeping? N  Some recent data might be hidden    Patient Care Team: Crecencio Mc, MD as PCP - General (Internal Medicine)    Assessment:   This is a routine wellness examination for Valine.  Nurse connected with patient 09/02/19 at  8:30 AM EST by a telephone enabled telemedicine application and verified that I am speaking with the correct person using two identifiers. Patient stated full name and DOB. Patient gave permission to continue with virtual visit.  Patient's location was at home and Nurse's location was at [place] office.   Patient is alert and oriented x3. Patient denies difficulty focusing or concentrating. Patient likes to complete word search puzzles for brain stimulation.   Health Maintenance Due: -Foot Exam- followed by pcp -Hgb A1c- 08/24/19 (6.0) See completed HM at the end of note.   Eye: Visual acuity not assessed. Virtual visit. Followed by their ophthalmologist.Retinopathy- none reported.  Dental: Visits every 6 months.    Hearing: Demonstrates normal hearing during visit.  Safety:  Patient feels safe at home- yes Patient does have smoke detectors at home- yes Patient does wear sunscreen or protective clothing when in direct sunlight - yes Patient does wear seat belt when in a moving vehicle - yes Patient drives- yes Adequate lighting in walkways free from debris- yes Grab bars and handrails used as appropriate- yes Ambulates with an assistive device- no Cell phone on person when ambulating outside of the home- yes  Social: Alcohol intake - no  Smoking history- never   Smokers in home? none Illicit drug use? none  Medication: Taking as directed and without issues.  Pill box in use -yes  Self managed - yes   Covid-19: Precautions and sickness symptoms discussed. Wears mask, social distancing, hand hygiene as appropriate.   Activities of Daily Living Patient denies needing assistance with: household chores, feeding themselves, getting from bed to chair, getting to the toilet, bathing/showering, dressing, managing money, or preparing meals.   Discussed the importance of a healthy diet, water intake and the benefits of aerobic exercise.    Physical activity- active around the home, no routine.  Diet:  Low carb Water: good intake Caffeine: 2 cups of coffee  Other Providers Patient Care Team: Crecencio Mc, MD as PCP - General (Internal Medicine)  Exercise Activities and Dietary  recommendations Current Exercise Habits: Home exercise routine, Type of exercise: walking, Intensity: Mild  Goals    . Increase physical activity     Weight loss Walk for exercise (increase from 2 to 5 days)  Fall Risk Fall Risk  09/02/2019 04/15/2018 04/10/2017 02/18/2017 12/21/2015  Falls in the past year? 0 No No No Yes  Number falls in past yr: - - - - 1  Injury with Fall? - - - - No  Follow up Falls prevention discussed - - - -   Timed Get Up and Go performed: no, virtual visit  Depression Screen PHQ 2/9 Scores 09/02/2019 04/15/2018 04/10/2017 02/18/2017  PHQ - 2 Score 0 0 0 0  PHQ- 9 Score - - 0 0     Cognitive Function MMSE - Mini Mental State Exam 04/15/2018 04/10/2017 12/21/2015  Orientation to time 5 5 5   Orientation to Place 5 5 5   Registration 3 3 3   Attention/ Calculation 5 5 5   Recall 3 3 3   Language- name 2 objects 2 2 2   Language- repeat 1 1 1   Language- follow 3 step command 3 3 3   Language- read & follow direction 1 1 1   Write a sentence 1 1 1   Copy design 1 1 1   Total score 30 30 30      6CIT Screen 09/02/2019  What Year? 0 points  What month? 0 points  What time? 0 points  Count back from 20 0 points  Months in reverse 0 points  Repeat phrase 0 points  Total Score 0    Immunization History  Administered Date(s) Administered  . Influenza Split 05/24/2014  . Influenza Whole 08/12/2007  . Influenza, High Dose Seasonal PF 06/06/2015, 05/20/2018, 06/18/2019  . Influenza-Unspecified 05/04/2014, 06/03/2016, 06/18/2017  . Pneumococcal Conjugate-13 06/21/2014  . Pneumococcal Polysaccharide-23 02/18/2008, 09/21/2015  . Td 10/16/2004  . Tdap 02/03/2014   Screening Tests Health Maintenance  Topic Date Due  . FOOT EXAM  02/22/2019  . HEMOGLOBIN A1C  02/22/2020  . OPHTHALMOLOGY EXAM  03/25/2020  . MAMMOGRAM  08/23/2020  . TETANUS/TDAP  02/04/2024  . INFLUENZA VACCINE  Completed  . DEXA SCAN  Completed  . PNA vac Low Risk Adult  Completed       Plan:   Keep all routine maintenance appointments.   Follow up with your doctor @ 130 today.  Medicare Attestation I have personally reviewed: The patient's medical and social history Their use of alcohol, tobacco or illicit drugs Their current medications and supplements The patient's functional ability including ADLs,fall risks, home safety risks, cognitive, and hearing and visual impairment Diet and physical activities Evidence for depression   I have reviewed and discussed with patient certain preventive protocols, quality metrics, and best practice recommendations.     OBrien-Blaney, Demara Lover L, LPN  [id number]     I have reviewed the above information and agree with above.   Deborra Medina, MD

## 2019-09-02 NOTE — Assessment & Plan Note (Addendum)
Tolerating a  high potency statin.  liver enzymes are normal,  No changes today.    Lab Results  Component Value Date   CHOL 129 03/20/2019   HDL 55.10 03/20/2019   LDLCALC 52 03/20/2019   LDLDIRECT 87.0 02/19/2018   TRIG 113.0 03/20/2019   CHOLHDL 2 03/20/2019   Lab Results  Component Value Date   ALT 14 08/24/2019   AST 15 08/24/2019   ALKPHOS 92 08/24/2019   BILITOT 0.5 08/24/2019

## 2019-09-02 NOTE — Progress Notes (Signed)
Telephone Note   This visit type was conducted due to national recommendations for restrictions regarding the COVID-19 pandemic (e.g. social distancing).  This format is felt to be most appropriate for this patient at this time.  All issues noted in this document were discussed and addressed.  No physical exam was performed (except for noted visual exam findings with Video Visits).   I connected with@ on 09/02/19 at  1:30 PM EST by  telephone and verified that I am speaking with the correct person using two identifiers. Location patient: home Location provider: work or home office Persons participating in the virtual visit: patient, provider  I discussed the limitations, risks, security and privacy concerns of performing an evaluation and management service by telephone and the availability of in person appointments. I also discussed with the patient that there may be a patient responsible charge related to this service. The patient expressed understanding and agreed to proceed.  Reason for visit:  Diabetes follow up HPI:  78 yr old female with type 2 DM, hypertension   Had eye exam in July  2020 [place]  Flu vaccine oct 15 Teeth cleaned Dec 7 Mammogram dec 12 normal   He feels generally well, is walking several times per week and checking blood sugars once daily at variable times.  BS have been under 130 fasting and < 150 post prandially.  Denies any recent hypoglyemic events.  Taking her medications as directed. Following a carbohydrate modified diet 6 days per week. Denies numbness, burning and tingling of extremities. Appetite is good.    Checks feet regularly,  No loss of feeling . Broke 5th toe one night   Turned black and blue  The patient has no signs or symptoms of COVID 19 infection (fever, cough, sore throat  or shortness of breath beyond what is typical for patient).  Patient denies contact with other persons with the above mentioned symptoms or with anyone confirmed to have  COVID 19 . Has not returned to church yet or visiting people   Using meloxicam 7.5 mg daily for knee pain with good results  and using CBD oil   HTN:  Checks BP once a week 137/62 pulse 59   ROS: See pertinent positives and negatives per HPI.  Past Medical History:  Diagnosis Date  . Diabetes mellitus   . GERD (gastroesophageal reflux disease)   . Hyperlipidemia   . Hypertension   . Osteoporosis   . Positive colorectal cancer screening using Cologuard test 2019  . Thyroid disease     Past Surgical History:  Procedure Laterality Date  . BREAST CYST ASPIRATION Bilateral 25+ yrs ago  . CATARACT EXTRACTION W/PHACO Right 03/02/2015   Procedure: CATARACT EXTRACTION PHACO AND INTRAOCULAR LENS PLACEMENT ([place]) TORIC LENS;  Surgeon: Leandrew Koyanagi, MD;  Location: [facility];  Service: Ophthalmology;  Laterality: Right;  TORIC  . CATARACT EXTRACTION W/PHACO Left 03/30/2015   Procedure: CATARACT EXTRACTION PHACO AND INTRAOCULAR LENS PLACEMENT ([facility]);  Surgeon: Leandrew Koyanagi, MD;  Location: [facility];  Service: Ophthalmology;  Laterality: Left;  TORIC  DIABETIC - oral meds  . CERVICAL POLYPECTOMY    . COLONOSCOPY  01/2007   Dr Olevia Perches  . COLONOSCOPY WITH PROPOFOL N/A 10/23/2017   Procedure: COLONOSCOPY WITH PROPOFOL;  Surgeon: Robert Bellow, MD;  Location: [facility];  Service: Endoscopy;  Laterality: N/A;  . HUMERUS FRACTURE SURGERY    . VAGINAL DELIVERY    . WRIST FRACTURE SURGERY      Family History  Problem Relation Age of Onset  . Hypertension Mother   . Stroke Mother 10       hemorrhagic  . Heart disease Father   . Cancer Father        Lung CA,  died of AMI while in [place] getting Laetril  . Diabetes Brother   . Hypertension Brother   . Hypertension Son   . Heart disease Maternal Grandfather   . Cancer Maternal Grandfather   . Breast cancer Maternal Grandmother 63  . Depression Sister     SOCIAL HX:  reports that she has never  smoked. She has never used smokeless tobacco. She reports that she does not drink alcohol or use drugs.   Current Outpatient Medications:  .  ALPRAZolam (XANAX) 0.25 MG tablet, Take 1 tablet (0.25 mg total) by mouth at bedtime as needed. for anxiety, Disp: 30 tablet, Rfl: 5 .  aspirin 81 MG tablet, Take 81 mg by mouth daily., Disp: , Rfl:  .  atorvastatin (LIPITOR) 20 MG tablet, TAKE 1 TABLET BY MOUTH EVERY DAY, Disp: 90 tablet, Rfl: 1 .  calcium-vitamin D (OSCAL WITH D) 500-200 MG-UNIT per tablet, Take 2 tablets by mouth daily. Reported on 03/21/2016, Disp: , Rfl:  .  GE100 BLOOD GLUCOSE TEST test strip, CHECK TWICE A DAY, Disp: 100 each, Rfl: 1 .  glipiZIDE (GLUCOTROL) 10 MG tablet, TAKE 1 TABLET BY MOUTH TWICE A DAY BEFORE A MEAL, Disp: 180 tablet, Rfl: 1 .  glucose blood (ONE TOUCH ULTRA TEST) test strip, USE AS DIRECTED, Disp: 100 each, Rfl: 1 .  glucose blood test strip, Check sugars twice daily,[employer] contour micro fill test strips, Dx 250.00, Disp: 100 each, Rfl: 5 .  levothyroxine (SYNTHROID) 88 MCG tablet, TAKE 1 TABLET BY MOUTH EVERY DAY ON EMPTY STOMACH WITH WATER AT LEAST 30-60 MINS BEFORE BREAKFAST, Disp: 90 tablet, Rfl: 1 .  losartan (COZAAR) 50 MG tablet, TAKE 1 TABLET BY MOUTH EVERY DAY, Disp: 90 tablet, Rfl: 1 .  meloxicam (MOBIC) 7.5 MG tablet, Take 1 tablet (7.5 mg total) by mouth daily. As needed for knee pain, Disp: 90 tablet, Rfl: 1 .  metFORMIN (GLUCOPHAGE-XR) 500 MG 24 hr tablet, Take 1 tablet (500 mg total) by mouth daily with breakfast., Disp: 90 tablet, Rfl: 3 .  ONE TOUCH ULTRA TEST test strip, USE AS DIRECTED, Disp: 100 each, Rfl: 1 .  ONE TOUCH ULTRA TEST test strip, USE AS DIRECTED, Disp: 100 each, Rfl: 1 .  ONE TOUCH ULTRA TEST test strip, USE AS DIRECTED, Disp: 100 each, Rfl: 12 .  ONETOUCH ULTRA test strip, USE AS DIRECTED, Disp: 100 strip, Rfl: 10 .  pioglitazone (ACTOS) 15 MG tablet, TAKE 1 TABLET BY MOUTH EVERY DAY, Disp: 90 tablet, Rfl: 0  EXAM:  VITALS  per patient if applicable:  GENERAL: alert, oriented, appears well and in no acute distress  HEENT: atraumatic, conjunttiva clear, no obvious abnormalities on inspection of external nose and ears  NECK: normal movements of the head and neck  LUNGS: on inspection no signs of respiratory distress, breathing rate appears normal, no obvious gross SOB, gasping or wheezing  CV: no obvious cyanosis  MS: moves all visible extremities without noticeable abnormality  PSYCH/NEURO: pleasant and cooperative, no obvious depression or anxiety, speech and thought processing grossly intact  ASSESSMENT AND PLAN:  Discussed the following assessment and plan:  DM type 2, controlled, with complication ([place])  Essential hypertension  Hyperlipidemia associated with type 2 diabetes mellitus ([place])  DM type  2, controlled, with complication Currently well-controlled on current medications .  hemoglobin A1c is at goal of less than 6.5 . Patient is up to date on her annual  eye exam and foot exam is deferred .  Patient has no microalbuminuria. Patient is tolerating statin therapy for CAD risk reduction and on ACE/ARB for renal protection and hypertension   Lab Results  Component Value Date   HGBA1C 6.0 08/24/2019     Essential hypertension Well controlled on current regimen. Renal function stable, no changes today.  Lab Results  Component Value Date   CREATININE 0.75 08/24/2019   Lab Results  Component Value Date   MICROALBUR 7.7 (H) 08/24/2019   MICROALBUR <0.7 08/22/2018      Hyperlipidemia associated with type 2 diabetes mellitus Tolerating a  high potency statin.  liver enzymes are normal,  No changes today.    Lab Results  Component Value Date   CHOL 129 03/20/2019   HDL 55.10 03/20/2019   LDLCALC 52 03/20/2019   LDLDIRECT 87.0 02/19/2018   TRIG 113.0 03/20/2019   CHOLHDL 2 03/20/2019   Lab Results  Component Value Date   ALT 14 08/24/2019   AST 15 08/24/2019   ALKPHOS 92  08/24/2019   BILITOT 0.5 08/24/2019     I provided  22 minutes of non-face-to-face time during this encounter reviewing patient's current problems and past procedures/imaging studies, providing counseling on the above mentioned problems , and coordination  of care .   I discussed the assessment and treatment plan with the patient. The patient was provided an opportunity to ask questions and all were answered. The patient agreed with the plan and demonstrated an understanding of the instructions.   The patient was advised to call back or seek an in-person evaluation if the symptoms worsen or if the condition fails to improve as anticipated.    Crecencio Mc, MD

## 2019-09-02 NOTE — Assessment & Plan Note (Addendum)
Currently well-controlled on current medications .  hemoglobin A1c is at goal of less than 6.5 . Patient is up to date on her annual  eye exam and foot exam is deferred .  Patient has no microalbuminuria. Patient is tolerating statin therapy for CAD risk reduction and on ACE/ARB for renal protection and hypertension   Lab Results  Component Value Date   HGBA1C 6.0 08/24/2019

## 2019-09-18 ENCOUNTER — Other Ambulatory Visit: Payer: Self-pay | Admitting: Internal Medicine

## 2019-10-08 DIAGNOSIS — R69 Illness, unspecified: Secondary | ICD-10-CM | POA: Diagnosis not present

## 2019-10-28 ENCOUNTER — Encounter: Payer: Self-pay | Admitting: Internal Medicine

## 2019-10-28 ENCOUNTER — Ambulatory Visit (INDEPENDENT_AMBULATORY_CARE_PROVIDER_SITE_OTHER): Payer: Medicare HMO | Admitting: Internal Medicine

## 2019-10-28 ENCOUNTER — Other Ambulatory Visit: Payer: Self-pay

## 2019-10-28 ENCOUNTER — Telehealth: Payer: Self-pay | Admitting: Internal Medicine

## 2019-10-28 VITALS — BP 132/78 | HR 60 | Temp 97.1°F | Wt 159.0 lb

## 2019-10-28 DIAGNOSIS — M545 Low back pain, unspecified: Secondary | ICD-10-CM

## 2019-10-28 DIAGNOSIS — R310 Gross hematuria: Secondary | ICD-10-CM | POA: Diagnosis not present

## 2019-10-28 LAB — POC URINALSYSI DIPSTICK (AUTOMATED)
Bilirubin, UA: NEGATIVE
Glucose, UA: NEGATIVE
Nitrite, UA: NEGATIVE
Protein, UA: POSITIVE — AB
Spec Grav, UA: 1.025
Urobilinogen, UA: 1 U/dL
pH, UA: 5.5

## 2019-10-28 MED ORDER — SULFAMETHOXAZOLE-TRIMETHOPRIM 400-80 MG PO TABS: 1.0000 | ORAL_TABLET | Freq: Two times a day (BID) | ORAL | 0 refills | Status: DC

## 2019-10-28 NOTE — Patient Instructions (Signed)
Hematuria, Adult Hematuria is blood in the urine. Blood may be visible in the urine, or it may be identified with a test. This condition can be caused by infections of the bladder, urethra, kidney, or prostate. Other possible causes include:  Kidney stones.  Cancer of the urinary tract.  Too much calcium in the urine.  Conditions that are passed from parent to child (inherited conditions).  Exercise that requires a lot of energy. Infections can usually be treated with medicine, and a kidney stone usually will pass through your urine. If neither of these is the cause of your hematuria, more tests may be needed to identify the cause of your symptoms. It is very important to tell your health care provider about any blood in your urine, even if it is painless or the blood stops without treatment. Blood in the urine, when it happens and then stops and then happens again, can be a symptom of a very serious condition, including cancer. There is no pain in the initial stages of many urinary cancers. Follow these instructions at home: Medicines  Take over-the-counter and prescription medicines only as told by your health care provider.  If you were prescribed an antibiotic medicine, take it as told by your health care provider. Do not stop taking the antibiotic even if you start to feel better. Eating and drinking  Drink enough fluid to keep your urine clear or pale yellow. It is recommended that you drink 3-4 quarts (2.8-3.8 L) a day. If you have been diagnosed with an infection, it is recommended that you drink cranberry juice in addition to large amounts of water.  Avoid caffeine, tea, and carbonated beverages. These tend to irritate the bladder.  Avoid alcohol because it may irritate the prostate (men). General instructions  If you have been diagnosed with a kidney stone, follow your health care provider's instructions about straining your urine to catch the stone.  Empty your bladder  often. Avoid holding urine for long periods of time.  If you are female: ? After a bowel movement, wipe from front to back and use each piece of toilet paper only once. ? Empty your bladder before and after sex.  Pay attention to any changes in your symptoms. Tell your health care provider about any changes or any new symptoms.  It is your responsibility to get your test results. Ask your health care provider, or the department performing the test, when your results will be ready.  Keep all follow-up visits as told by your health care provider. This is important. Contact a health care provider if:  You develop back pain.  You have a fever.  You have nausea or vomiting.  Your symptoms do not improve after 3 days.  Your symptoms get worse. Get help right away if:  You develop severe vomiting and are unable take medicine without vomiting.  You develop severe pain in your back or abdomen even though you are taking medicine.  You pass a large amount of blood in your urine.  You pass blood clots in your urine.  You feel very weak or like you might faint.  You faint. Summary  Hematuria is blood in the urine. It has many possible causes.  It is very important that you tell your health care provider about any blood in your urine, even if it is painless or the blood stops without treatment.  Take over-the-counter and prescription medicines only as told by your health care provider.  Drink enough fluid to keep   your urine clear or pale yellow. This information is not intended to replace advice given to you by your health care provider. Make sure you discuss any questions you have with your health care provider. Document Revised: 01/14/2019 Document Reviewed: 09/22/2016 Elsevier Patient Education  2020 Elsevier Inc.  

## 2019-10-28 NOTE — Telephone Encounter (Signed)
Transferred to [place]

## 2019-10-28 NOTE — Progress Notes (Signed)
HPI  Pt presents to the clinic today with c/o low back pain and hematuria. This started this morning. She describes the back pain as a constant achy/soreness. The pain does not radiate. She denies urgency, frequency, dysuria, pelvic pain, nausea, fever or chills. She denies vaginal discharge, odor or bleeding. She has no history of UTI's or kidney stones. She has not taken anything OTC for these symptoms.   Review of Systems  Past Medical History:  Diagnosis Date  . Diabetes mellitus   . GERD (gastroesophageal reflux disease)   . Hyperlipidemia   . Hypertension   . Osteoporosis   . Positive colorectal cancer screening using Cologuard test 2019  . Thyroid disease     Family History  Problem Relation Age of Onset  . Hypertension Mother   . Stroke Mother 41       hemorrhagic  . Heart disease Father   . Cancer Father        Lung CA,  died of AMI while in [place] getting Laetril  . Diabetes Brother   . Hypertension Brother   . Hypertension Son   . Heart disease Maternal Grandfather   . Cancer Maternal Grandfather   . Breast cancer Maternal Grandmother 67  . Depression Sister     Social History   Socioeconomic History  . Marital status: Widowed    Spouse name: Not on file  . Number of children: 2  . Years of education: Not on file  . Highest education level: Not on file  Occupational History  . Occupation: Doctor, hospital: RETIRED  Tobacco Use  . Smoking status: Never Smoker  . Smokeless tobacco: Never Used  Substance and Sexual Activity  . Alcohol use: No  . Drug use: No  . Sexual activity: Never  Other Topics Concern  . Not on file  Social History Narrative   Widowed in spring of 2012   Lost brother also   Has living will   Son and daughter hold health care POA   Would accept resuscitation but no prolonged artificial ventilation   Not sure about feeding tube   Social Determinants of Health   Financial Resource Strain:   . Difficulty of  Paying Living Expenses: Not on file  Food Insecurity:   . Worried About Charity fundraiser in the Last Year: Not on file  . Ran Out of Food in the Last Year: Not on file  Transportation Needs:   . Lack of Transportation (Medical): Not on file  . Lack of Transportation (Non-Medical): Not on file  Physical Activity:   . Days of Exercise per Week: Not on file  . Minutes of Exercise per Session: Not on file  Stress:   . Feeling of Stress : Not on file  Social Connections:   . Frequency of Communication with Friends and Family: Not on file  . Frequency of Social Gatherings with Friends and Family: Not on file  . Attends Religious Services: Not on file  . Active Member of Clubs or Organizations: Not on file  . Attends Archivist Meetings: Not on file  . Marital Status: Not on file  Intimate Partner Violence:   . Fear of Current or Ex-Partner: Not on file  . Emotionally Abused: Not on file  . Physically Abused: Not on file  . Sexually Abused: Not on file    Allergies  Allergen Reactions  . Latex Rash     Constitutional: Denies fever, malaise, fatigue, headache or  abrupt weight changes.   GU: Pt reports blood in urine. Denies urgency, dysuria, burning sensation, odor or discharge. Skin: Denies redness, rashes, lesions or ulcercations.  MSK: Pt reports low back pain. Denies decrease in ROM, difficulty with gait.  No other specific complaints in a complete review of systems (except as listed in HPI above).    Objective:   Physical Exam  BP 132/78   Pulse 60   Temp (!) 97.1 F (36.2 C) (Temporal)   Wt 159 lb (72.1 kg)   SpO2 97%   BMI 28.17 kg/m   Wt Readings from Last 3 Encounters:  09/02/19 150 lb (68 kg)  09/02/19 150 lb (68 kg)  09/01/18 150 lb 12.8 oz (68.4 kg)    General: Appears her stated age, well developed, well nourished in NAD. Cardiovascular: Normal rate and rhythm. S1,S2 noted.   Pulmonary/Chest: Normal effort and positive vesicular breath  sounds. No respiratory distress. No wheezes, rales or ronchi noted.  Abdomen: Soft and nontender. Normal bowel sounds. No distention or masses noted.  No CVA tenderness.        Assessment & Plan:   Hematuria, Low Back Pain:  Urinalysis: 2+ leuks, 3+ blood Will send urine culture eRx sent if for Bactrim 400-80mg  BID x 5 days Drink plenty of fluids If no improvement or urine culture negative, consider CT renal stone study  RTC as needed or if symptoms persist. Webb Silversmith, NP This visit occurred during the SARS-CoV-2 public health emergency.  Safety protocols were in place, including screening questions prior to the visit, additional usage of staff PPE, and extensive cleaning of exam room while observing appropriate contact time as indicated for disinfecting solutions.

## 2019-10-28 NOTE — Telephone Encounter (Signed)
Pt is passing blood in her urine. Pt is getting up 2-4 times a night.

## 2019-10-28 NOTE — Telephone Encounter (Signed)
Pt has been scheduled at [facility] today.

## 2019-10-29 LAB — URINE CULTURE
MICRO NUMBER:: 10183705
Result:: NO GROWTH
SPECIMEN QUALITY:: ADEQUATE

## 2019-11-02 ENCOUNTER — Telehealth: Payer: Self-pay | Admitting: *Deleted

## 2019-11-02 DIAGNOSIS — R310 Gross hematuria: Secondary | ICD-10-CM

## 2019-11-02 NOTE — Telephone Encounter (Signed)
If she is still passing blood,  We will need to rule out a kidney stone with a CT scan ,  Which I have ordered STAT so it gets done within the next 48 hours   I am forwarding this to [name] since [name] is out

## 2019-11-02 NOTE — Telephone Encounter (Signed)
Patient called stating that she saw Webb Silversmith NP last week and got a call back telling her that she does not have a UTI. Patient stated that she wants to know what would be the next step? Patient stated that she is still having blood in her urine, but no pain. Patient stated that she wants to know if she needs further testing? See lab results. Webb Silversmith NP, stated that patient needs to discuss with her PCP. Patient requested a call back from her PCP regarding the next step.

## 2019-11-03 ENCOUNTER — Other Ambulatory Visit: Payer: Self-pay

## 2019-11-03 ENCOUNTER — Ambulatory Visit
Admission: RE | Admit: 2019-11-03 | Discharge: 2019-11-03 | Disposition: A | Discharge status: Home / Self Care | Payer: Medicare HMO | Source: Ambulatory Visit | Attending: Internal Medicine | Admitting: Internal Medicine

## 2019-11-03 ENCOUNTER — Encounter: Payer: Self-pay | Admitting: Internal Medicine

## 2019-11-03 ENCOUNTER — Other Ambulatory Visit: Payer: Self-pay | Admitting: Internal Medicine

## 2019-11-03 DIAGNOSIS — R310 Gross hematuria: Secondary | ICD-10-CM | POA: Diagnosis present

## 2019-11-03 DIAGNOSIS — N200 Calculus of kidney: Secondary | ICD-10-CM | POA: Diagnosis not present

## 2019-11-03 DIAGNOSIS — Z87442 Personal history of urinary calculi: Secondary | ICD-10-CM | POA: Insufficient documentation

## 2019-11-03 DIAGNOSIS — N2889 Other specified disorders of kidney and ureter: Secondary | ICD-10-CM

## 2019-11-03 NOTE — Telephone Encounter (Signed)
Spoke with pt to let her know that Dr. Derrel Nip has ordered a STAT CT scan to be done either today or tomorrow to rule out kidney stones. Pt gave a verbal understanding.

## 2019-11-03 NOTE — Telephone Encounter (Signed)
Patient scheduled and CT performed today.

## 2019-11-03 NOTE — Telephone Encounter (Signed)
Dr. Derrel Nip spoke with pt in regards to her CT scan results.

## 2019-11-04 ENCOUNTER — Other Ambulatory Visit: Payer: Self-pay | Admitting: Internal Medicine

## 2019-11-04 DIAGNOSIS — R69 Illness, unspecified: Secondary | ICD-10-CM | POA: Diagnosis not present

## 2019-11-04 NOTE — Telephone Encounter (Signed)
Please close and sign encounter if completed.

## 2019-11-09 ENCOUNTER — Other Ambulatory Visit: Payer: Self-pay | Admitting: Internal Medicine

## 2019-11-11 ENCOUNTER — Other Ambulatory Visit: Payer: Self-pay | Admitting: Radiology

## 2019-11-11 ENCOUNTER — Ambulatory Visit (INDEPENDENT_AMBULATORY_CARE_PROVIDER_SITE_OTHER): Payer: Medicare HMO | Admitting: Urology

## 2019-11-11 ENCOUNTER — Other Ambulatory Visit: Payer: Self-pay

## 2019-11-11 VITALS — BP 150/76 | HR 64 | Ht 63.0 in | Wt 160.0 lb

## 2019-11-11 DIAGNOSIS — R310 Gross hematuria: Secondary | ICD-10-CM

## 2019-11-11 DIAGNOSIS — N2889 Other specified disorders of kidney and ureter: Secondary | ICD-10-CM | POA: Diagnosis not present

## 2019-11-11 DIAGNOSIS — N200 Calculus of kidney: Secondary | ICD-10-CM

## 2019-11-11 NOTE — Progress Notes (Signed)
11/11/19 7:41 PM   [name] [birth year] [id number]  Referring provider: Crecencio Mc, MD [place],  [place] [address]  Chief Complaint  Patient presents with   renal mass    HPI: [name] is a 79 y.o. white F with a hx of diabetes presents today for the evaluation and management of renal mass, nephrolithiasis, and gross hematuria. She was referred to [place] by Crecencio Mc, MD.   On 10/08/19, pt reported of gross hematuria and low back pain for which her PCP did a UA dip with culture and CT.  Her UA on dip indicated positive protein and moderate 2+ leukocytes with associated urine culture negative on 10/28/19.   Her CT from 11/03/19 indicated a 7 mm nephrolithiasis in the right renal pelvis with no obstructive uropathy and diverticulosis w/o evidence of diverticulitis.   She reports of blood in urine onset 2 weeks ago and describes it as dark and tea-like. Her symptoms improved with diet lemonade, cranberry juice and water. She noticed lighter color and no blood since last weekend.   She denies burning with urination and F/C. She attributes her non-bothersome frequency to diabetes. She reports of nocturia 5x which hasn't changed and not bothersome.   She smoked when she was in her 20-30s for social reasons. She still occasionally smokes when she is upset but hasn't had one in a while.   No previous history of kidney stones, heart attack or stroke. She denies use of blood thinners.   No flank pain.  PMH:  Past Medical History:  Diagnosis Date   Diabetes mellitus    GERD (gastroesophageal reflux disease)    Hyperlipidemia    Hypertension    Osteoporosis    Positive colorectal cancer screening using Cologuard test 2019   Thyroid disease     Surgical History: Past Surgical History:  Procedure Laterality Date   BREAST CYST ASPIRATION Bilateral 25+ yrs ago   CATARACT EXTRACTION W/PHACO Right 03/02/2015   Procedure: CATARACT  EXTRACTION PHACO AND INTRAOCULAR LENS PLACEMENT ([place]) TORIC LENS;  Surgeon: Leandrew Koyanagi, MD;  Location: [facility];  Service: Ophthalmology;  Laterality: Right;  TORIC   CATARACT EXTRACTION W/PHACO Left 03/30/2015   Procedure: CATARACT EXTRACTION PHACO AND INTRAOCULAR LENS PLACEMENT ([facility]);  Surgeon: Leandrew Koyanagi, MD;  Location: [facility];  Service: Ophthalmology;  Laterality: Left;  TORIC  DIABETIC - oral meds   CERVICAL POLYPECTOMY     COLONOSCOPY  01/2007   Dr Olevia Perches   COLONOSCOPY WITH PROPOFOL N/A 10/23/2017   Procedure: COLONOSCOPY WITH PROPOFOL;  Surgeon: Robert Bellow, MD;  Location: [facility] ENDOSCOPY;  Service: Endoscopy;  Laterality: N/A;   HUMERUS FRACTURE SURGERY     VAGINAL DELIVERY     WRIST FRACTURE SURGERY      Home Medications:  Allergies as of 11/11/2019      Reactions   Nickel    Latex Rash      Medication List       Accurate as of November 11, 2019  7:41 PM. If you have any questions, ask your nurse or doctor.        STOP taking these medications   sulfamethoxazole-trimethoprim 400-80 MG tablet Commonly known as: Bactrim Stopped by: Hollice Espy, MD     TAKE these medications   ALPRAZolam 0.25 MG tablet Commonly known as: XANAX Take 1 tablet (0.25 mg total) by mouth at bedtime as needed. for anxiety   aspirin 81 MG tablet Take  81 mg by mouth daily.   atorvastatin 20 MG tablet Commonly known as: LIPITOR TAKE 1 TABLET BY MOUTH EVERY DAY   calcium-vitamin D 500-200 MG-UNIT tablet Commonly known as: OSCAL WITH D Take 2 tablets by mouth daily. Reported on 03/21/2016   glipiZIDE 10 MG tablet Commonly known as: GLUCOTROL TAKE 1 TABLET BY MOUTH TWICE A DAY BEFORE A MEAL   glucose blood test strip Check sugars twice daily,ascensia contour micro fill test strips, Dx 250.00   GE100 Blood Glucose Test test strip Generic drug: glucose blood CHECK TWICE A DAY   glucose blood test strip Commonly known as: ONE TOUCH  ULTRA TEST USE AS DIRECTED   ONE TOUCH ULTRA TEST test strip Generic drug: glucose blood USE AS DIRECTED   ONE TOUCH ULTRA TEST test strip Generic drug: glucose blood USE AS DIRECTED   ONE TOUCH ULTRA TEST test strip Generic drug: glucose blood USE AS DIRECTED   OneTouch Ultra test strip Generic drug: glucose blood USE AS DIRECTED   levothyroxine 88 MCG tablet Commonly known as: SYNTHROID TAKE 1 TABLET BY MOUTH EVERY DAY ON EMPTY STOMACH WITH WATER AT LEAST 30-60 MINS BEFORE BREAKFAST   losartan 50 MG tablet Commonly known as: COZAAR TAKE 1 TABLET BY MOUTH EVERY DAY   meloxicam 7.5 MG tablet Commonly known as: MOBIC Take 1 tablet (7.5 mg total) by mouth daily. As needed for knee pain   metFORMIN 500 MG 24 hr tablet Commonly known as: GLUCOPHAGE-XR Take 1 tablet (500 mg total) by mouth daily with breakfast. What changed: when to take this   metFORMIN 500 MG tablet Commonly known as: GLUCOPHAGE Take 500 mg by mouth 2 (two) times daily. What changed: Another medication with the same name was changed. Make sure you understand how and when to take each.   pioglitazone 15 MG tablet Commonly known as: ACTOS TAKE 1 TABLET BY MOUTH EVERY DAY       Allergies:  Allergies  Allergen Reactions   Nickel    Latex Rash    Family History: Family History  Problem Relation Age of Onset   Hypertension Mother    Stroke Mother 47       hemorrhagic   Heart disease Father    Cancer Father        Lung CA,  died of AMI while in [place] getting Laetril   Diabetes Brother    Hypertension Brother    Hypertension Son    Heart disease Maternal Grandfather    Cancer Maternal Grandfather    Breast cancer Maternal Grandmother 20   Depression Sister     Social History:  reports that she has never smoked. She has never used smokeless tobacco. She reports that she does not drink alcohol or use drugs.   Physical Exam: BP (!) 150/76    Pulse 64    Ht 5\' 3"  (1.6 m)     Wt 160 lb (72.6 kg)    BMI 28.34 kg/m   Constitutional:  Alert and oriented, No acute distress. HEENT: [place] AT, moist mucus membranes.  Trachea midline, no masses. Cardiovascular: No clubbing, cyanosis, or edema. Respiratory: Normal respiratory effort, no increased work of breathing. Skin: No rashes, bruises or suspicious lesions. Neurologic: Grossly intact, no focal deficits, moving all 4 extremities. Psychiatric: Normal mood and affect.  Pertinent Imaging: Results for orders placed during the hospital encounter of 11/03/19  CT RENAL STONE STUDY   Narrative CLINICAL DATA:  Hematuria  EXAM: CT ABDOMEN AND PELVIS WITHOUT CONTRAST  TECHNIQUE: Multidetector CT imaging of the abdomen and pelvis was performed following the standard protocol without IV contrast.  COMPARISON:  None.  FINDINGS: Lower chest: No acute abnormality.  Hepatobiliary: No focal liver abnormality is seen. No gallstones, gallbladder wall thickening, or biliary dilatation.  Pancreas: Unremarkable. No pancreatic ductal dilatation or surrounding inflammatory changes.  Spleen: Normal in size without focal abnormality.  Adrenals/Urinary Tract: Normal adrenal glands. 7 mm right renal pelvis. No obstructive uropathy. 6 x 5 cm hypodense, fluid attenuating left upper pole renal mass consistent with a cyst. 5 mm hypodense right renal mass of indeterminate etiology. Normal bladder.  Stomach/Bowel: Stomach is within normal limits. Appendix appears normal. No evidence of bowel wall thickening, distention, or inflammatory changes. Diverticulosis without evidence of diverticulitis.  Vascular/Lymphatic: No significant vascular findings are present. No enlarged abdominal or pelvic lymph nodes.  Reproductive: Uterus and bilateral adnexa are unremarkable.  Other: No abdominal wall hernia or abnormality. No abdominopelvic ascites.  Musculoskeletal: No acute or significant osseous findings. Degenerative disc disease  and facet arthropathy of the lumbar spine.  IMPRESSION: 1. There is a 7 mm nephrolithiasis in the right renal pelvis. No obstructive uropathy. 2. Diverticulosis without evidence of diverticulitis. 3.  Aortic Atherosclerosis (ICD10-I70.0).   Electronically Signed   By: Kathreen Devoid   On: 11/03/2019 12:40    I have personally reviewed the images and agree with radiologist interpretation.   HFU ~1500  Assessment & Plan:     1. Right renal stone CT indicated 7 mm kidney stone in right renal pelvis with no obstructive uropathy   Based on size of stone and location, suspect this may be cause of gross hematuria and will have a difficutly time passing this spontaneously, although not impossible.  We discussed various treatment options including ESWL vs. ureteroscopy, laser lithotripsy, and stent. We discussed the risks and benefits of both including bleeding, infection, damage to surrounding structures, efficacy with need for possible further intervention, and need for temporary ureteral stent.  Pt understood and interested in right ureteroscopy with laser lithotripsy; scheduled.    Preop labs/ UA/UCx  2. Gross hematuria  See Above   Plan for cysto, bilateral retrograde to r/o any other additional underlying pathology  3. Right renal cyst  F/u with [facility] after stone treated - suspect simple Bosniak I cyst She was very concerned about these lesion, reassured  [place] [address], [place], [place] [address] [phone]   I, Lucas Mallow, am acting as a scribe for Dr. Hollice Espy,  I have reviewed the above documentation for accuracy and completeness, and I agree with the above.   Hollice Espy, MD  I spent 60 total minutes on the day of the encounter including pre-visit review of the medical record, face-to-face time with the patient, and post visit ordering of labs/imaging/tests.

## 2019-11-11 NOTE — H&P (View-Only) (Signed)
11/11/19 7:41 PM   [name] 06/25/1941 [id number]  Referring provider: Crecencio Mc, MD [place],  [place] [address]  Chief Complaint  Patient presents with  . renal mass    HPI: [name] is a 79 y.o. white F with a hx of diabetes presents today for the evaluation and management of renal mass, nephrolithiasis, and gross hematuria. She was referred to [place] by Crecencio Mc, MD.   On 10/08/19, pt reported of gross hematuria and low back pain for which her PCP did a UA dip with culture and CT.  Her UA on dip indicated positive protein and moderate 2+ leukocytes with associated urine culture negative on 10/28/19.   Her CT from 11/03/19 indicated a 7 mm nephrolithiasis in the right renal pelvis with no obstructive uropathy and diverticulosis w/o evidence of diverticulitis.   She reports of blood in urine onset 2 weeks ago and describes it as dark and tea-like. Her symptoms improved with diet lemonade, cranberry juice and water. She noticed lighter color and no blood since last weekend.   She denies burning with urination and F/C. She attributes her non-bothersome frequency to diabetes. She reports of nocturia 5x which hasn't changed and not bothersome.   She smoked when she was in her 20-30s for social reasons. She still occasionally smokes when she is upset but hasn't had one in a while.   No previous history of kidney stones, heart attack or stroke. She denies use of blood thinners.   No flank pain.  PMH:  Past Medical History:  Diagnosis Date  . Diabetes mellitus   . GERD (gastroesophageal reflux disease)   . Hyperlipidemia   . Hypertension   . Osteoporosis   . Positive colorectal cancer screening using Cologuard test 2019  . Thyroid disease     Surgical History: Past Surgical History:  Procedure Laterality Date  . BREAST CYST ASPIRATION Bilateral 25+ yrs ago  . CATARACT EXTRACTION W/PHACO Right 03/02/2015   Procedure: CATARACT  EXTRACTION PHACO AND INTRAOCULAR LENS PLACEMENT ([place]) TORIC LENS;  Surgeon: Leandrew Koyanagi, MD;  Location: [facility];  Service: Ophthalmology;  Laterality: Right;  TORIC  . CATARACT EXTRACTION W/PHACO Left 03/30/2015   Procedure: CATARACT EXTRACTION PHACO AND INTRAOCULAR LENS PLACEMENT ([facility]);  Surgeon: Leandrew Koyanagi, MD;  Location: [facility];  Service: Ophthalmology;  Laterality: Left;  TORIC  DIABETIC - oral meds  . CERVICAL POLYPECTOMY    . COLONOSCOPY  01/2007   Dr Olevia Perches  . COLONOSCOPY WITH PROPOFOL N/A 10/23/2017   Procedure: COLONOSCOPY WITH PROPOFOL;  Surgeon: Robert Bellow, MD;  Location: [facility];  Service: Endoscopy;  Laterality: N/A;  . HUMERUS FRACTURE SURGERY    . VAGINAL DELIVERY    . WRIST FRACTURE SURGERY      Home Medications:  Allergies as of 11/11/2019      Reactions   Nickel    Latex Rash      Medication List       Accurate as of November 11, 2019  7:41 PM. If you have any questions, ask your nurse or doctor.        STOP taking these medications   sulfamethoxazole-trimethoprim 400-80 MG tablet Commonly known as: Bactrim Stopped by: Hollice Espy, MD     TAKE these medications   ALPRAZolam 0.25 MG tablet Commonly known as: XANAX Take 1 tablet (0.25 mg total) by mouth at bedtime as needed. for anxiety   aspirin 81 MG tablet Take  81 mg by mouth daily.   atorvastatin 20 MG tablet Commonly known as: LIPITOR TAKE 1 TABLET BY MOUTH EVERY DAY   calcium-vitamin D 500-200 MG-UNIT tablet Commonly known as: OSCAL WITH D Take 2 tablets by mouth daily. Reported on 03/21/2016   glipiZIDE 10 MG tablet Commonly known as: GLUCOTROL TAKE 1 TABLET BY MOUTH TWICE A DAY BEFORE A MEAL   glucose blood test strip Check sugars twice daily,ascensia contour micro fill test strips, Dx 250.00   GE100 Blood Glucose Test test strip Generic drug: glucose blood CHECK TWICE A DAY   glucose blood test strip Commonly known as: ONE TOUCH  ULTRA TEST USE AS DIRECTED   ONE TOUCH ULTRA TEST test strip Generic drug: glucose blood USE AS DIRECTED   ONE TOUCH ULTRA TEST test strip Generic drug: glucose blood USE AS DIRECTED   ONE TOUCH ULTRA TEST test strip Generic drug: glucose blood USE AS DIRECTED   OneTouch Ultra test strip Generic drug: glucose blood USE AS DIRECTED   levothyroxine 88 MCG tablet Commonly known as: SYNTHROID TAKE 1 TABLET BY MOUTH EVERY DAY ON EMPTY STOMACH WITH WATER AT LEAST 30-60 MINS BEFORE BREAKFAST   losartan 50 MG tablet Commonly known as: COZAAR TAKE 1 TABLET BY MOUTH EVERY DAY   meloxicam 7.5 MG tablet Commonly known as: MOBIC Take 1 tablet (7.5 mg total) by mouth daily. As needed for knee pain   metFORMIN 500 MG 24 hr tablet Commonly known as: GLUCOPHAGE-XR Take 1 tablet (500 mg total) by mouth daily with breakfast. What changed: when to take this   metFORMIN 500 MG tablet Commonly known as: GLUCOPHAGE Take 500 mg by mouth 2 (two) times daily. What changed: Another medication with the same name was changed. Make sure you understand how and when to take each.   pioglitazone 15 MG tablet Commonly known as: ACTOS TAKE 1 TABLET BY MOUTH EVERY DAY       Allergies:  Allergies  Allergen Reactions  . Nickel   . Latex Rash    Family History: Family History  Problem Relation Age of Onset  . Hypertension Mother   . Stroke Mother 30       hemorrhagic  . Heart disease Father   . Cancer Father        Lung CA,  died of AMI while in [place] getting Laetr[place]  . Diabetes Brother   . Hypertension Brother   . Hypertension Son   . Heart disease Maternal Grandfather   . Cancer Maternal Grandfather   . Breast cancer Maternal Grandmother 77  . Depression Sister     Social History:  reports that she has never smoked. She has never used smokeless tobacco. She reports that she does not drink alcohol or use drugs.   Physical Exam: BP (!) 150/76   Pulse 64   Ht 5\' 3"  (1.6 m)    Wt 160 lb (72.6 kg)   BMI 28.34 kg/m   Constitutional:  Alert and oriented, No acute distress. HEENT: [place] AT, moist mucus membranes.  Trachea midline, no masses. Cardiovascular: No clubbing, cyanosis, or edema. Respiratory: Normal respiratory effort, no increased work of breathing. Skin: No rashes, bruises or suspicious lesions. Neurologic: Grossly intact, no focal deficits, moving all 4 extremities. Psychiatric: Normal mood and affect.  Pertinent Imaging: Results for orders placed during the hospital encounter of 11/03/19  CT RENAL STONE STUDY   Narrative CLINICAL DATA:  Hematuria  EXAM: CT ABDOMEN AND PELVIS WITHOUT CONTRAST  TECHNIQUE: Multidetector CT  imaging of the abdomen and pelvis was performed following the standard protocol without IV contrast.  COMPARISON:  None.  FINDINGS: Lower chest: No acute abnormality.  Hepatobiliary: No focal liver abnormality is seen. No gallstones, gallbladder wall thickening, or biliary dilatation.  Pancreas: Unremarkable. No pancreatic ductal dilatation or surrounding inflammatory changes.  Spleen: Normal in size without focal abnormality.  Adrenals/Urinary Tract: Normal adrenal glands. 7 mm right renal pelvis. No obstructive uropathy. 6 x 5 cm hypodense, fluid attenuating left upper pole renal mass consistent with a cyst. 5 mm hypodense right renal mass of indeterminate etiology. Normal bladder.  Stomach/Bowel: Stomach is within normal limits. Appendix appears normal. No evidence of bowel wall thickening, distention, or inflammatory changes. Diverticulosis without evidence of diverticulitis.  Vascular/Lymphatic: No significant vascular findings are present. No enlarged abdominal or pelvic lymph nodes.  Reproductive: Uterus and bilateral adnexa are unremarkable.  Other: No abdominal wall hernia or abnormality. No abdominopelvic ascites.  Musculoskeletal: No acute or significant osseous findings. Degenerative disc disease  and facet arthropathy of the lumbar spine.  IMPRESSION: 1. There is a 7 mm nephrolithiasis in the right renal pelvis. No obstructive uropathy. 2. Diverticulosis without evidence of diverticulitis. 3.  Aortic Atherosclerosis (ICD10-I70.0).   Electronically Signed   By: Kathreen Devoid   On: 11/03/2019 12:40    I have personally reviewed the images and agree with radiologist interpretation.   HFU ~1500  Assessment & Plan:     1. Right renal stone CT indicated 7 mm kidney stone in right renal pelvis with no obstructive uropathy   Based on size of stone and location, suspect this may be cause of gross hematuria and will have a difficutly time passing this spontaneously, although not impossible.  We discussed various treatment options including ESWL vs. ureteroscopy, laser lithotripsy, and stent. We discussed the risks and benefits of both including bleeding, infection, damage to surrounding structures, efficacy with need for possible further intervention, and need for temporary ureteral stent.  Pt understood and interested in right ureteroscopy with laser lithotripsy; scheduled.    Preop labs/ UA/UCx  2. Gross hematuria  See Above   Plan for cysto, bilateral retrograde to r/o any other additional underlying pathology  3. 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 Hollice Espy,  I have reviewed the above documentation for accuracy and completeness, and I agree with the above.   Hollice Espy, MD  I spent 60 total minutes on the day of the encounter including pre-visit review of the medical record, face-to-face time with the patient, and post visit ordering of labs/imaging/tests.

## 2019-11-12 ENCOUNTER — Ambulatory Visit: Payer: Medicare HMO | Attending: Internal Medicine

## 2019-11-12 ENCOUNTER — Other Ambulatory Visit: Payer: Self-pay | Admitting: Internal Medicine

## 2019-11-12 DIAGNOSIS — Z23 Encounter for immunization: Secondary | ICD-10-CM

## 2019-11-12 LAB — URINALYSIS, COMPLETE
Bilirubin, UA: NEGATIVE
Glucose, UA: NEGATIVE
Ketones, UA: NEGATIVE
Nitrite, UA: NEGATIVE
Specific Gravity, UA: 1.02 (ref 1.005–1.030)
Urobilinogen, Ur: 0.2 mg/dL (ref 0.2–1.0)
pH, UA: 5.5 (ref 5.0–7.5)

## 2019-11-12 LAB — MICROSCOPIC EXAMINATION: RBC, Urine: >30 /HPF — AB (ref 0–2)

## 2019-11-12 NOTE — Progress Notes (Signed)
   Covid-19 Vaccination Clinic  Name:  [name]    MRN: [id number] DOB: 03-Mar-1941  11/12/2019  [name] was observed post Covid-19 immunization for 15 minutes without incident. She was provided with Vaccine Information Sheet and instruction to access the V-Safe system.   [name] was instructed to call 911 with any severe reactions post vaccine: Marland Kitchen Difficulty breathing  . Swelling of face and throat  . A fast heartbeat  . A bad rash all over body  . Dizziness and weakness   Immunizations Administered    Name Date Dose VIS Date Route   Pfizer COVID-19 Vaccine 11/12/2019 11:37 AM 0.3 mL 08/14/2019 Intramuscular   Manufacturer: [place]   Lot: [id number]   [place]: [id number]

## 2019-11-25 ENCOUNTER — Other Ambulatory Visit: Payer: Self-pay

## 2019-11-25 ENCOUNTER — Telehealth: Payer: Self-pay | Admitting: Radiology

## 2019-11-25 ENCOUNTER — Encounter
Admission: RE | Admit: 2019-11-25 | Discharge: 2019-11-25 | Disposition: A | Discharge status: Home / Self Care | Payer: Medicare HMO | Source: Ambulatory Visit | Attending: Urology | Admitting: Urology

## 2019-11-25 DIAGNOSIS — I10 Essential (primary) hypertension: Secondary | ICD-10-CM | POA: Insufficient documentation

## 2019-11-25 DIAGNOSIS — Z01818 Encounter for other preprocedural examination: Secondary | ICD-10-CM | POA: Diagnosis present

## 2019-11-25 DIAGNOSIS — E119 Type 2 diabetes mellitus without complications: Secondary | ICD-10-CM | POA: Diagnosis not present

## 2019-11-25 DIAGNOSIS — R001 Bradycardia, unspecified: Secondary | ICD-10-CM | POA: Diagnosis not present

## 2019-11-25 HISTORY — DX: Anxiety disorder, unspecified: F41.9

## 2019-11-25 LAB — CBC
HCT: 36.5 % (ref 36.0–46.0)
Hemoglobin: 12.1 g/dL (ref 12.0–15.0)
MCH: 29.5 pg (ref 26.0–34.0)
MCHC: 33.2 g/dL (ref 30.0–36.0)
MCV: 89 fL (ref 80.0–100.0)
Platelets: 152 10*3/uL (ref 150–400)
RBC: 4.1 MIL/uL (ref 3.87–5.11)
RDW: 13.7 % (ref 11.5–15.5)
WBC: 6.1 10*3/uL (ref 4.0–10.5)
nRBC: 0 % (ref 0.0–0.2)

## 2019-11-25 LAB — BASIC METABOLIC PANEL WITH GFR
Anion gap: 9 (ref 5–15)
BUN: 28 mg/dL — ABNORMAL HIGH (ref 8–23)
CO2: 26 mmol/L (ref 22–32)
Calcium: 9.4 mg/dL (ref 8.9–10.3)
Chloride: 105 mmol/L (ref 98–111)
Creatinine, Ser: 0.64 mg/dL (ref 0.44–1.00)
GFR calc Af Amer: >60 mL/min
GFR calc non Af Amer: >60 mL/min
Glucose, Bld: 113 mg/dL — ABNORMAL HIGH (ref 70–99)
Potassium: 4.5 mmol/L (ref 3.5–5.1)
Sodium: 140 mmol/L (ref 135–145)

## 2019-11-25 NOTE — Patient Instructions (Signed)
Your procedure is scheduled on: Monday 12/07/19.  Report to DAY SURGERY DEPARTMENT LOCATED ON 2ND FLOOR MEDICAL MALL ENTRANCE. To find out your arrival time please call [phone] between 1PM - 3PM on Friday 12/04/19.   Remember: Instructions that are not followed completely may result in serious medical risk, up to and including death, or upon the discretion of your surgeon and anesthesiologist your surgery may need to be rescheduled.      _X__ 1. Do not eat food after midnight the night before your procedure.                 No gum chewing or hard candies. You may drink SUGAR FREE clear liquids up to 2 hours                 before you are scheduled to arrive for your surgery- DO NOT drink clear                 liquids within 2 hours of the start of your surgery. EXAMPLES: WATER, GATORADE ZERO                   __X__2.  On the morning of surgery brush your teeth with toothpaste and water, you may rinse your mouth with mouthwash if you wish.  Do not swallow any toothpaste or mouthwash.     __X__3.  Notify your doctor if there is any change in your medical condition      (cold, fever, infections).       Do not wear jewelry, make-up, hairpins, clips or nail polish. Do not wear lotions, powders, or perfumes.  Do not shave 48 hours prior to surgery. Men may shave face and neck. Do not bring valuables to the hospital.      [facility] is not responsible for any belongings or valuables.    Contacts, dentures/partials or body piercings may not be worn into surgery. Bring a case for your contacts, glasses or hearing aids, a denture cup will be supplied.    Patients discharged the day of surgery will not be allowed to drive home.     __X__ Take these medicines the morning of surgery with A SIP OF WATER:     1. acetaminophen (TYLENOL) if you need it  2. ALPRAZolam Duanne Moron) if you need it     __X__ Use CHG Soap as directed.    __X__ Stop Metformin 2 days prior to your  procedure. Your last dose will be on Friday 12/04/19.    __X__ Stop Anti-inflammatories 7 days before surgery such as Advil, Ibuprofen, Motrin, BC or Goodies Powder, Naprosyn, Naproxen, Aleve, Aspirin, Meloxicam. May take Tylenol if needed for pain or discomfort.  Your last doses of Asprin and Meloxicam will be on Monday 11/30/19.     __X__ Don't start taking any new herbal supplements or vitamins prior to your procedure.

## 2019-11-25 NOTE — Telephone Encounter (Signed)
Patient called to request pain medicine "just in case". Patient denies pain. Advised patient that we could prescribe pain medication if she develops pain but if she is not having pain she doesn't need it at this time.  Patient reports hematuria that has since resolved. Advised patient this is normal and to call back if she has severe pain, nausea, vomiting or fever. Patient verbalized understanding.

## 2019-11-27 ENCOUNTER — Other Ambulatory Visit: Payer: Medicare HMO

## 2019-11-27 ENCOUNTER — Other Ambulatory Visit: Payer: Self-pay

## 2019-11-27 DIAGNOSIS — N2889 Other specified disorders of kidney and ureter: Secondary | ICD-10-CM

## 2019-11-27 LAB — URINALYSIS, COMPLETE
Bilirubin, UA: NEGATIVE
Glucose, UA: NEGATIVE
Ketones, UA: NEGATIVE
Nitrite, UA: NEGATIVE
Specific Gravity, UA: 1.025 (ref 1.005–1.030)
Urobilinogen, Ur: 0.2 mg/dL (ref 0.2–1.0)
pH, UA: 5 (ref 5.0–7.5)

## 2019-11-27 LAB — MICROSCOPIC EXAMINATION: RBC, Urine: >30 /HPF — AB (ref 0–2)

## 2019-12-02 LAB — CULTURE, URINE COMPREHENSIVE

## 2019-12-03 ENCOUNTER — Other Ambulatory Visit: Payer: Self-pay

## 2019-12-03 ENCOUNTER — Other Ambulatory Visit
Admission: RE | Admit: 2019-12-03 | Discharge: 2019-12-03 | Disposition: A | Discharge status: Home / Self Care | Payer: Medicare HMO | Source: Ambulatory Visit | Attending: Urology | Admitting: Urology

## 2019-12-03 ENCOUNTER — Ambulatory Visit: Payer: Self-pay | Admitting: Urology

## 2019-12-03 DIAGNOSIS — Z20822 Contact with and (suspected) exposure to covid-19: Secondary | ICD-10-CM | POA: Insufficient documentation

## 2019-12-03 DIAGNOSIS — Z01812 Encounter for preprocedural laboratory examination: Secondary | ICD-10-CM | POA: Insufficient documentation

## 2019-12-03 LAB — SARS CORONAVIRUS 2 (TAT 6-24 HRS): SARS Coronavirus 2: NEGATIVE

## 2019-12-05 DIAGNOSIS — R69 Illness, unspecified: Secondary | ICD-10-CM | POA: Diagnosis not present

## 2019-12-07 ENCOUNTER — Encounter: Payer: Self-pay | Admitting: Urology

## 2019-12-07 ENCOUNTER — Ambulatory Visit: Payer: Medicare HMO | Admitting: Anesthesiology

## 2019-12-07 ENCOUNTER — Encounter
Admission: RE | Disposition: A | Discharge status: Home / Self Care | Payer: Self-pay | Source: Home / Self Care | Attending: Urology

## 2019-12-07 ENCOUNTER — Ambulatory Visit: Payer: Medicare HMO

## 2019-12-07 ENCOUNTER — Other Ambulatory Visit: Payer: Self-pay

## 2019-12-07 ENCOUNTER — Ambulatory Visit
Admission: RE | Admit: 2019-12-07 | Discharge: 2019-12-07 | Disposition: A | Discharge status: Home / Self Care | Payer: Medicare HMO | Attending: Urology | Admitting: Urology

## 2019-12-07 DIAGNOSIS — E119 Type 2 diabetes mellitus without complications: Secondary | ICD-10-CM | POA: Insufficient documentation

## 2019-12-07 DIAGNOSIS — R310 Gross hematuria: Secondary | ICD-10-CM

## 2019-12-07 DIAGNOSIS — Z79899 Other long term (current) drug therapy: Secondary | ICD-10-CM | POA: Diagnosis not present

## 2019-12-07 DIAGNOSIS — E039 Hypothyroidism, unspecified: Secondary | ICD-10-CM | POA: Diagnosis not present

## 2019-12-07 DIAGNOSIS — Z7984 Long term (current) use of oral hypoglycemic drugs: Secondary | ICD-10-CM | POA: Diagnosis not present

## 2019-12-07 DIAGNOSIS — N281 Cyst of kidney, acquired: Secondary | ICD-10-CM | POA: Diagnosis not present

## 2019-12-07 DIAGNOSIS — F17200 Nicotine dependence, unspecified, uncomplicated: Secondary | ICD-10-CM | POA: Diagnosis not present

## 2019-12-07 DIAGNOSIS — Z79890 Hormone replacement therapy (postmenopausal): Secondary | ICD-10-CM | POA: Insufficient documentation

## 2019-12-07 DIAGNOSIS — F419 Anxiety disorder, unspecified: Secondary | ICD-10-CM | POA: Diagnosis not present

## 2019-12-07 DIAGNOSIS — I10 Essential (primary) hypertension: Secondary | ICD-10-CM | POA: Insufficient documentation

## 2019-12-07 DIAGNOSIS — R319 Hematuria, unspecified: Secondary | ICD-10-CM | POA: Diagnosis not present

## 2019-12-07 DIAGNOSIS — N200 Calculus of kidney: Secondary | ICD-10-CM | POA: Insufficient documentation

## 2019-12-07 DIAGNOSIS — Z7982 Long term (current) use of aspirin: Secondary | ICD-10-CM | POA: Insufficient documentation

## 2019-12-07 DIAGNOSIS — E785 Hyperlipidemia, unspecified: Secondary | ICD-10-CM | POA: Diagnosis not present

## 2019-12-07 DIAGNOSIS — R69 Illness, unspecified: Secondary | ICD-10-CM | POA: Diagnosis not present

## 2019-12-07 HISTORY — PX: CYSTOSCOPY W/ RETROGRADES: SHX1426

## 2019-12-07 HISTORY — PX: CYSTOSCOPY/URETEROSCOPY/HOLMIUM LASER/STENT PLACEMENT: SHX6546

## 2019-12-07 LAB — GLUCOSE, CAPILLARY
Glucose-Capillary: 133 mg/dL — ABNORMAL HIGH (ref 70–99)
Glucose-Capillary: 137 mg/dL — ABNORMAL HIGH (ref 70–99)

## 2019-12-07 SURGERY — CYSTOSCOPY/URETEROSCOPY/HOLMIUM LASER/STENT PLACEMENT
Anesthesia: General | Site: Ureter | Laterality: Right

## 2019-12-07 MED ORDER — HYDROCODONE-ACETAMINOPHEN 5-325 MG PO TABS: 1.0000 | ORAL_TABLET | Freq: Four times a day (QID) | ORAL | 0 refills | Status: DC | PRN

## 2019-12-07 MED ORDER — SUGAMMADEX SODIUM 200 MG/2ML IV SOLN
INTRAVENOUS | Status: DC | PRN
  Administered 2019-12-07: 300 mg via INTRAVENOUS

## 2019-12-07 MED ORDER — FAMOTIDINE 20 MG PO TABS: 20.0000 mg | ORAL_TABLET | Freq: Once | ORAL | Status: AC

## 2019-12-07 MED ORDER — IOHEXOL 180 MG/ML  SOLN
INTRAMUSCULAR | Status: DC | PRN
  Administered 2019-12-07: 20 mL

## 2019-12-07 MED ORDER — MIDAZOLAM HCL 2 MG/2ML IJ SOLN
INTRAMUSCULAR | Status: AC
  Filled 2019-12-07: qty 2

## 2019-12-07 MED ORDER — LACTATED RINGERS IV SOLN: INTRAVENOUS | Status: DC | PRN

## 2019-12-07 MED ORDER — ACETAMINOPHEN 10 MG/ML IV SOLN
INTRAVENOUS | Status: DC | PRN
  Administered 2019-12-07: 1000 mg via INTRAVENOUS

## 2019-12-07 MED ORDER — OXYBUTYNIN CHLORIDE 5 MG PO TABS
ORAL_TABLET | ORAL | Status: AC
  Filled 2019-12-07: qty 1

## 2019-12-07 MED ORDER — CEFAZOLIN SODIUM-DEXTROSE 2-4 GM/100ML-% IV SOLN
INTRAVENOUS | Status: AC
  Filled 2019-12-07: qty 100

## 2019-12-07 MED ORDER — PHENYLEPHRINE HCL (PRESSORS) 10 MG/ML IV SOLN
INTRAVENOUS | Status: AC
  Filled 2019-12-07: qty 1

## 2019-12-07 MED ORDER — FAMOTIDINE 20 MG PO TABS
ORAL_TABLET | ORAL | Status: AC
  Administered 2019-12-07: 20 mg via ORAL
  Filled 2019-12-07: qty 1

## 2019-12-07 MED ORDER — SODIUM CHLORIDE 0.9 % IV SOLN: INTRAVENOUS | Status: DC

## 2019-12-07 MED ORDER — FENTANYL CITRATE (PF) 100 MCG/2ML IJ SOLN
INTRAMUSCULAR | Status: AC
  Filled 2019-12-07: qty 2

## 2019-12-07 MED ORDER — LIDOCAINE HCL (CARDIAC) PF 100 MG/5ML IV SOSY
PREFILLED_SYRINGE | INTRAVENOUS | Status: DC | PRN
  Administered 2019-12-07: 60 mg via INTRAVENOUS

## 2019-12-07 MED ORDER — OXYBUTYNIN CHLORIDE 5 MG PO TABS: 5.0000 mg | ORAL_TABLET | Freq: Three times a day (TID) | ORAL | 0 refills | Status: DC | PRN

## 2019-12-07 MED ORDER — MIDAZOLAM HCL 2 MG/2ML IJ SOLN
INTRAMUSCULAR | Status: DC | PRN
  Administered 2019-12-07: 1 mg via INTRAVENOUS

## 2019-12-07 MED ORDER — OXYCODONE HCL 5 MG/5ML PO SOLN: 5.0000 mg | Freq: Once | ORAL | Status: DC | PRN

## 2019-12-07 MED ORDER — OXYCODONE HCL 5 MG PO TABS: 5.0000 mg | ORAL_TABLET | Freq: Once | ORAL | Status: DC | PRN

## 2019-12-07 MED ORDER — OXYBUTYNIN CHLORIDE 5 MG PO TABS
5.0000 mg | ORAL_TABLET | Freq: Three times a day (TID) | ORAL | Status: DC | PRN
  Administered 2019-12-07: 5 mg via ORAL

## 2019-12-07 MED ORDER — PROPOFOL 10 MG/ML IV BOLUS
INTRAVENOUS | Status: DC | PRN
  Administered 2019-12-07: 110 mg via INTRAVENOUS

## 2019-12-07 MED ORDER — CEFAZOLIN SODIUM-DEXTROSE 2-4 GM/100ML-% IV SOLN
2.0000 g | INTRAVENOUS | Status: AC
  Administered 2019-12-07: 2 g via INTRAVENOUS

## 2019-12-07 MED ORDER — ROCURONIUM BROMIDE 100 MG/10ML IV SOLN
INTRAVENOUS | Status: DC | PRN
  Administered 2019-12-07: 40 mg via INTRAVENOUS

## 2019-12-07 MED ORDER — ONDANSETRON HCL 4 MG/2ML IJ SOLN
INTRAMUSCULAR | Status: DC | PRN
  Administered 2019-12-07: 4 mg via INTRAVENOUS

## 2019-12-07 MED ORDER — GLYCOPYRROLATE 0.2 MG/ML IJ SOLN
INTRAMUSCULAR | Status: DC | PRN
  Administered 2019-12-07: .2 mg via INTRAVENOUS

## 2019-12-07 MED ORDER — FENTANYL CITRATE (PF) 100 MCG/2ML IJ SOLN
INTRAMUSCULAR | Status: DC | PRN
  Administered 2019-12-07: 25 ug via INTRAVENOUS

## 2019-12-07 MED ORDER — DEXAMETHASONE SODIUM PHOSPHATE 10 MG/ML IJ SOLN
INTRAMUSCULAR | Status: DC | PRN
  Administered 2019-12-07: 10 mg via INTRAVENOUS

## 2019-12-07 MED ORDER — FENTANYL CITRATE (PF) 100 MCG/2ML IJ SOLN: 25.0000 ug | INTRAMUSCULAR | Status: DC | PRN

## 2019-12-07 MED ORDER — TAMSULOSIN HCL 0.4 MG PO CAPS: 0.4000 mg | ORAL_CAPSULE | Freq: Every day | ORAL | 0 refills | Status: DC

## 2019-12-07 MED ORDER — PHENYLEPHRINE HCL (PRESSORS) 10 MG/ML IV SOLN
INTRAVENOUS | Status: DC | PRN
  Administered 2019-12-07 (×2): 100 ug via INTRAVENOUS

## 2019-12-07 SURGICAL SUPPLY — 32 items
BAG DRAIN CYSTO-URO LG1000N (MISCELLANEOUS) ×3
BASKET ZERO TIP 1.9FR (BASKET)
BRUSH SCRUB EZ  4% CHG (MISCELLANEOUS) ×3
BRUSH SCRUB EZ 1% IODOPHOR (MISCELLANEOUS)
BRUSH SCRUB EZ 4% CHG (MISCELLANEOUS) ×2
BSKT STON RTRVL ZERO TP 1.9FR (BASKET)
CATH URETL 5X70 OPEN END (CATHETERS) ×3
CNTNR SPEC 2.5X3XGRAD LEK (MISCELLANEOUS)
CONT SPEC 4OZ STER OR WHT (MISCELLANEOUS)
CONT SPEC 4OZ STRL OR WHT (MISCELLANEOUS)
DRAPE UTILITY 15X26 TOWEL STRL (DRAPES) ×3
FIBER LASER TRACTIP 200 (UROLOGICAL SUPPLIES) ×3
GLOVE BIO SURGEON STRL SZ 6.5 (GLOVE) ×3
GOWN STRL REUS W/ TWL LRG LVL3 (GOWN DISPOSABLE) ×4
GOWN STRL REUS W/TWL LRG LVL3 (GOWN DISPOSABLE) ×6
GUIDEWIRE GREEN .038 145CM (MISCELLANEOUS)
GUIDEWIRE STR DUAL SENSOR (WIRE) ×3
GUIDEWIRE SUPER STIFF .035X180 (WIRE) ×3
INFUSOR MANOMETER BAG 3000ML (MISCELLANEOUS) ×3
INTRODUCER DILATOR DOUBLE (INTRODUCER) ×3
KIT TURNOVER CYSTO (KITS) ×3
PACK CYSTO AR (MISCELLANEOUS) ×3
SET CYSTO W/LG BORE CLAMP LF (SET/KITS/TRAYS/PACK) ×3
SHEATH URETERAL 12FRX35CM (MISCELLANEOUS) ×3
SOL .9 NS 3000ML IRR  AL (IV SOLUTION) ×3
SOL .9 NS 3000ML IRR AL (IV SOLUTION) ×2
SOL .9 NS 3000ML IRR UROMATIC (IV SOLUTION) ×2
STENT URET 6FRX22 CONTOUR (STENTS) ×3
STENT URET 6FRX24 CONTOUR (STENTS)
STENT URET 6FRX26 CONTOUR (STENTS)
SURGILUBE 2OZ TUBE FLIPTOP (MISCELLANEOUS) ×3
WATER STERILE IRR 1000ML POUR (IV SOLUTION) ×3

## 2019-12-07 NOTE — Anesthesia Postprocedure Evaluation (Signed)
Anesthesia Post Note  Patient: [name]  Procedure(s) Performed: CYSTOSCOPY/URETEROSCOPY/HOLMIUM LASER/STENT PLACEMENT (Right Ureter) CYSTOSCOPY WITH RETROGRADE PYELOGRAM (Bilateral Ureter)  Patient location during evaluation: PACU Anesthesia Type: General Level of consciousness: awake and alert Pain management: pain level controlled Vital Signs Assessment: post-procedure vital signs reviewed and stable Respiratory status: spontaneous breathing, nonlabored ventilation and respiratory function stable Cardiovascular status: blood pressure returned to baseline and stable Postop Assessment: no apparent nausea or vomiting Anesthetic complications: no     Last Vitals:  Vitals:   12/07/19 1205 12/07/19 1230  BP: (!) 165/60 (!) 153/69  Pulse: 60 (!) 52  Resp: 20 18  Temp: (!) 36.4 C 36.6 C  SpO2: 100% 100%    Last Pain:  Vitals:   12/07/19 1230  TempSrc: Temporal  PainSc: 0-No pain                 Tera Mater

## 2019-12-07 NOTE — Interval H&P Note (Signed)
Regular rate and rhythm Clear to auscultation bilaterally  All additional questions answered.  H&P up-to-date.

## 2019-12-07 NOTE — Discharge Instructions (Signed)
AMBULATORY SURGERY  DISCHARGE INSTRUCTIONS   1) The drugs that you were given will stay in your system until tomorrow so for the next 24 hours you should not:  A) Drive an automobile B) Make any legal decisions C) Drink any alcoholic beverage   2) You may resume regular meals tomorrow.  Today it is better to start with liquids and gradually work up to solid foods.  You may eat anything you prefer, but it is better to start with liquids, then soup and crackers, and gradually work up to solid foods.   3) Please notify your doctor immediately if you have any unusual bleeding, trouble breathing, redness and pain at the surgery site, drainage, fever, or pain not relieved by medication.    4) Additional Instructions:        Please contact your physician with any problems or Same Day Surgery at [phone], Monday through Friday 6 am to 4 pm, or  at [facility] number at [phone].                      You have a ureteral stent in place.  This is a tube that extends from your kidney to your bladder.  This may cause urinary bleeding, burning with urination, and urinary frequency.  Please call our office or present to the ED if you develop fevers >101 or pain which is not able to be controlled with oral pain medications.  You may be given either Flomax and/ or ditropan to help with bladder spasms and stent pain in addition to pain medications.    Your stent in on a string taped to your left inner thigh.  On Friday AM, you may untape and remove your stent.    [facility] [address] [place], [place] [address] [phone]

## 2019-12-07 NOTE — Transfer of Care (Signed)
Immediate Anesthesia Transfer of Care Note  Patient: [name]  Procedure(s) Performed: CYSTOSCOPY/URETEROSCOPY/HOLMIUM LASER/STENT PLACEMENT (Right Ureter) CYSTOSCOPY WITH RETROGRADE PYELOGRAM (Bilateral Ureter)  Patient Location: PACU  Anesthesia Type:General  Level of Consciousness: awake, patient cooperative and responds to stimulation  Airway & Oxygen Therapy: Patient Spontanous Breathing and Patient connected to face mask oxygen  Post-op Assessment: Report given to RN and Post -op Vital signs reviewed and stable  Post vital signs: Reviewed and stable  Last Vitals:  Vitals Value Taken Time  BP 127/95 12/07/19 1051  Temp    Pulse 60 12/07/19 1053  Resp 19 12/07/19 1053  SpO2 100 % 12/07/19 1053  Vitals shown include unvalidated device data.  Last Pain:  Vitals:   12/07/19 0843  TempSrc: Temporal  PainSc: 0-No pain         Complications: No apparent anesthesia complications

## 2019-12-07 NOTE — Anesthesia Preprocedure Evaluation (Addendum)
Anesthesia Evaluation  Patient identified by MRN, date of birth, ID band Patient awake    Reviewed: Allergy & Precautions, H&P , NPO status , Patient's Chart, lab work & pertinent test results  Airway Mallampati: III  TM Distance: <3 FB Neck ROM: full    Dental  (+) Teeth Intact   Pulmonary neg pulmonary ROS, neg shortness of breath, neg sleep apnea, neg COPD, neg recent URI,    breath sounds clear to auscultation       Cardiovascular hypertension, (-) angina(-) Past MI (-) dysrhythmias  Rhythm:regular Rate:Normal     Neuro/Psych Anxiety negative neurological ROS  negative psych ROS   GI/Hepatic Neg liver ROS, GERD  Controlled,  Endo/Other  diabetesHypothyroidism   Renal/GU      Musculoskeletal   Abdominal   Peds  Hematology negative hematology ROS (+)   Anesthesia Other Findings Past Medical History: No date: Anxiety No date: Diabetes mellitus No date: GERD (gastroesophageal reflux disease) No date: Hyperlipidemia No date: Hypertension No date: Osteoporosis 2019: Positive colorectal cancer screening using Cologuard test No date: Thyroid disease  Past Surgical History: 25+ yrs ago: BREAST CYST ASPIRATION; Bilateral 03/02/2015: CATARACT EXTRACTION W/PHACO; Right     Comment:  Procedure: CATARACT EXTRACTION PHACO AND INTRAOCULAR               LENS PLACEMENT ([place]) TORIC LENS;  Surgeon: Leandrew Koyanagi, MD;  Location: [facility];  Service:              Ophthalmology;  Laterality: Right;  TORIC 03/30/2015: CATARACT EXTRACTION W/PHACO; Left     Comment:  Procedure: CATARACT EXTRACTION PHACO AND INTRAOCULAR               LENS PLACEMENT ([facility]);  Surgeon: Leandrew Koyanagi, MD;               Location: [facility];  Service: Ophthalmology;                Laterality: Left;  TORIC  DIABETIC - oral meds No date: CERVICAL POLYPECTOMY 01/2007: COLONOSCOPY     Comment:  Dr  Olevia Perches 10/23/2017: COLONOSCOPY WITH PROPOFOL; N/A     Comment:  Procedure: COLONOSCOPY WITH PROPOFOL;  Surgeon: Robert Bellow, MD;  Location: [facility];  Service:               Endoscopy;  Laterality: N/A; No date: HUMERUS FRACTURE SURGERY No date: VAGINAL DELIVERY No date: WRIST FRACTURE SURGERY     Reproductive/Obstetrics negative OB ROS                            Anesthesia Physical Anesthesia Plan  ASA: II  Anesthesia Plan: General ETT   Post-op Pain Management:    Induction:   PONV Risk Score and Plan: Ondansetron, Dexamethasone and Treatment may vary due to age or medical condition  Airway Management Planned:   Additional Equipment:   Intra-op Plan:   Post-operative Plan:   Informed Consent: I have reviewed the patients History and Physical, chart, labs and discussed the procedure including the risks, benefits and alternatives for the proposed anesthesia with the patient or authorized representative who has indicated his/her understanding and acceptance.     Dental Advisory Given  Plan Discussed with: Anesthesiologist  Anesthesia Plan Comments:  Anesthesia Quick Evaluation  

## 2019-12-07 NOTE — Op Note (Addendum)
Date of procedure: 12/07/19  Preoperative diagnosis:  1. Right renal stone 2. Hematuria  Postoperative diagnosis:  1. Same as above  Procedure: 1. Cystoscopy 2. Bilateral retrograde pyelogram 3. Right ureteroscopy with laser lithotripsy  Surgeon: Hollice Espy, MD  Anesthesia: General  Complications: None  Intraoperative findings: 7 mm hard right renal pelvic stone.  Normal bilateral retrograde pyelograms.  Bladder unremarkable.  EBL: Minimal  Specimens: None  Drains: 6 x 22 French double-J ureteral stent on right, tether left in place taped to left inner thigh  Indication: [name] is a 79 y.o. patient with hematuria found to have right renal pelvic stone.  After reviewing the management options for treatment,s he elected to proceed with the above surgical procedure(s). We have discussed the potential benefits and risks of the procedure, side effects of the proposed treatment, the likelihood of the patient achieving the goals of the procedure, and any potential problems that might occur during the procedure or recuperation. Informed consent has been obtained.  Description of procedure:  The patient was taken to the operating room and general anesthesia was induced.  The patient was placed in the dorsal lithotomy position, prepped and draped in the usual sterile fashion, and preoperative antibiotics were administered. A preoperative time-out was performed.   A 21 French scope was advanced per urethra into the bladder.  The bladder is carefully inspected and noted to be free of any tumors lesions or ulcerations.  Attention was [place] first to the left ureteral orifice which was cannulated using a 5 [place] open-ended ureteral catheter.  Gentle retrograde pyelogram was performed which revealed no hydroureteronephrosis or filling defects.  Next, attention was turned to the right side.  The stone could be seen on scout imaging.  Gentle retrograde pyelogram on the side was also  unremarkable, the kidney did have slightly drooping appearance with a filling defect in the renal pelvis at the location of the known stone.  Otherwise, this was also unremarkable.  A sensor wire was then placed up to level of the kidney under fluoroscopic guidance.  A dual-lumen sheath was used to introduce a second Super Stiff wire as a working wire.  The sensor wire was snapped in place.  Initially, I attempted to use a Cook ureteral access sheath, 12/14 [place] but was only successful in placing the inner lumen only thus elected not to use an access sheath.  I placed a 7 French flexible digital ureteroscope over the working wire up to the level of the kidney under fluoroscopic guidance without difficulty whatsoever.  The wire was removed leaving only the safety wire in place.  The stone was countered in the renal pelvis.  A 200 m laser fiber was then brought in using dusting settings of 0.2 J and 40 Hz, the stone was fragmented into very small pieces no larger than the size of the laser fiber.  After fragmentation was complete, a final retrograde pyelogram was performed outlining the collecting system.  There is no contrast extravasation.  Each and every calyx was then directly visualized and no significant residual stone burden remained.  The scope was then backed down the length the ureter inspecting along the way.  There was a longitudinal ureteral abrasion which appeared to be superficial, approximately 1 cm length within the distal ureter without contrast extravasation.  Otherwise, the ureter was unremarkable without any additional stone fragments or injury appreciated.  A 6 x 22 French double-J ureteral stent was advanced over the sensor wire up to the  level of the kidney.  The wires partially drawn till full coils noted within the renal pelvis as well is within the bladder upon its full removal.  The stent string was left affixed to the patient's left inner thigh using Mastisol and Tegaderm.  She was then  repositioned in supine position [facility] and taken to PACU in stable condition.  Plan: She will remove her own stent on Friday.  Follow-up with a renal ultrasound in approximately a month.  Hollice Espy, M.D.

## 2019-12-10 ENCOUNTER — Telehealth: Payer: Self-pay | Admitting: Internal Medicine

## 2019-12-10 DIAGNOSIS — E785 Hyperlipidemia, unspecified: Secondary | ICD-10-CM

## 2019-12-10 DIAGNOSIS — R5383 Other fatigue: Secondary | ICD-10-CM

## 2019-12-10 DIAGNOSIS — E034 Atrophy of thyroid (acquired): Secondary | ICD-10-CM

## 2019-12-10 DIAGNOSIS — I10 Essential (primary) hypertension: Secondary | ICD-10-CM

## 2019-12-10 DIAGNOSIS — E118 Type 2 diabetes mellitus with unspecified complications: Secondary | ICD-10-CM

## 2019-12-10 DIAGNOSIS — E1169 Type 2 diabetes mellitus with other specified complication: Secondary | ICD-10-CM

## 2019-12-10 NOTE — Telephone Encounter (Signed)
Pt would like to have labs done prior to her appt in August. I have ordered, CBC, lipid panel, a1c, cmp, tsh, and microalbumin. Is there anything else that needs to be ordered?

## 2019-12-10 NOTE — Telephone Encounter (Signed)
Pt called and set up an appt with Dr T in August and wants a lab appt before then. Please put in orders and schedule.

## 2019-12-11 ENCOUNTER — Other Ambulatory Visit: Payer: Self-pay

## 2019-12-11 ENCOUNTER — Ambulatory Visit (INDEPENDENT_AMBULATORY_CARE_PROVIDER_SITE_OTHER): Payer: Medicare HMO | Admitting: Physician Assistant

## 2019-12-11 ENCOUNTER — Ambulatory Visit: Payer: Self-pay | Admitting: Physician Assistant

## 2019-12-11 VITALS — BP 134/75 | HR 55

## 2019-12-11 DIAGNOSIS — N200 Calculus of kidney: Secondary | ICD-10-CM | POA: Diagnosis not present

## 2019-12-11 LAB — MICROSCOPIC EXAMINATION
Bacteria, UA: NONE SEEN
RBC, Urine: >30 /HPF — AB (ref 0–2)

## 2019-12-11 LAB — URINALYSIS, COMPLETE
Bilirubin, UA: NEGATIVE
Glucose, UA: NEGATIVE
Ketones, UA: NEGATIVE
Nitrite, UA: NEGATIVE
Specific Gravity, UA: 1.02 (ref 1.005–1.030)
Urobilinogen, Ur: 0.2 mg/dL (ref 0.2–1.0)
pH, UA: 7.5 (ref 5.0–7.5)

## 2019-12-11 LAB — BLADDER SCAN AMB NON-IMAGING: Scan Result: 84

## 2019-12-11 NOTE — Progress Notes (Signed)
12/11/2019 10:48 AM   [name] [birth year] [id number]  CC: Difficulty urinating, gross hematuria, RLQ pain, nausea  HPI: [name] is a 79 y.o. female who presents today for evaluation of difficulty urinating, gross hematuria, RLQ pain, and nausea following tethered stent removal at home this morning. She is POD 4 from cystoscopy and right ureteroscopy with laser lithotripsy and stent placement with Dr. Erlene Quan for management of a 7 mm right renal pelvic stone.  Patient reports having removed her stent around 6:30 AM today without difficulty.  She states that after stent removal, she developed RLQ pain, nausea, chills, and the inability to urinate for approximately 2 hours.  She has been able to void since.  She took oxybutynin at 6 AM this morning and an oxycodone at 8 AM this morning.  She states that she has had a poor appetite since surgery and has not been eating much.  She was given crackers and juice in clinic today and reports feeling much better now.  In-office catheterized UA today positive for 3+ blood, 3+ protein, and 1+ leukocyte esterase; urine microscopy with 6-10 WBCs/HPF and>30 RBCs/HPF. PVR 46mL.  PMH: Past Medical History:  Diagnosis Date  . Anxiety   . Diabetes mellitus   . GERD (gastroesophageal reflux disease)   . Hyperlipidemia   . Hypertension   . Osteoporosis   . Positive colorectal cancer screening using Cologuard test 2019  . Thyroid disease     Surgical History: Past Surgical History:  Procedure Laterality Date  . BREAST CYST ASPIRATION Bilateral 25+ yrs ago  . CATARACT EXTRACTION W/PHACO Right 03/02/2015   Procedure: CATARACT EXTRACTION PHACO AND INTRAOCULAR LENS PLACEMENT ([place]) TORIC LENS;  Surgeon: Leandrew Koyanagi, MD;  Location: [facility];  Service: Ophthalmology;  Laterality: Right;  TORIC  . CATARACT EXTRACTION W/PHACO Left 03/30/2015   Procedure: CATARACT EXTRACTION PHACO AND INTRAOCULAR LENS PLACEMENT ([facility]);  Surgeon:  Leandrew Koyanagi, MD;  Location: [facility];  Service: Ophthalmology;  Laterality: Left;  TORIC  DIABETIC - oral meds  . CERVICAL POLYPECTOMY    . COLONOSCOPY  01/2007   Dr Olevia Perches  . COLONOSCOPY WITH PROPOFOL N/A 10/23/2017   Procedure: COLONOSCOPY WITH PROPOFOL;  Surgeon: Robert Bellow, MD;  Location: [facility];  Service: Endoscopy;  Laterality: N/A;  . CYSTOSCOPY W/ RETROGRADES Bilateral 12/07/2019   Procedure: CYSTOSCOPY WITH RETROGRADE PYELOGRAM;  Surgeon: Hollice Espy, MD;  Location: [facility];  Service: Urology;  Laterality: Bilateral;  . CYSTOSCOPY/URETEROSCOPY/HOLMIUM LASER/STENT PLACEMENT Right 12/07/2019   Procedure: CYSTOSCOPY/URETEROSCOPY/HOLMIUM LASER/STENT PLACEMENT;  Surgeon: Hollice Espy, MD;  Location: [facility];  Service: Urology;  Laterality: Right;  . HUMERUS FRACTURE SURGERY    . VAGINAL DELIVERY    . WRIST FRACTURE SURGERY      Home Medications:  Allergies as of 12/11/2019      Reactions   Latex Rash   Nickel Rash      Medication List       Accurate as of December 11, 2019 10:48 AM. If you have any questions, ask your nurse or doctor.        acetaminophen 650 MG CR tablet Commonly known as: TYLENOL Take 650 mg by mouth every 8 (eight) hours as needed for pain.   ALPRAZolam 0.25 MG tablet Commonly known as: XANAX Take 1 tablet (0.25 mg total) by mouth at bedtime as needed. for anxiety What changed:   how much to take  reasons to take this  additional instructions   aspirin 81 MG  tablet Take 81 mg by mouth daily.   atorvastatin 20 MG tablet Commonly known as: LIPITOR TAKE 1 TABLET BY MOUTH EVERY DAY   bismuth subsalicylate 99991111 [id number] suspension Commonly known as: PEPTO BISMOL Take 30 mLs by mouth every 6 (six) hours as needed for diarrhea or loose stools.   calcium-vitamin D 500-200 MG-UNIT tablet Commonly known as: OSCAL WITH D Take 1 tablet by mouth daily.   glipiZIDE 10 MG tablet Commonly known as: GLUCOTROL TAKE 1  TABLET BY MOUTH TWICE A DAY BEFORE A MEAL What changed: See the new instructions.   glucose blood test strip Check sugars twice daily,ascensia contour micro fill test strips, Dx 250.00   GE100 Blood Glucose Test test strip Generic drug: glucose blood CHECK TWICE A DAY   glucose blood test strip Commonly known as: ONE TOUCH ULTRA TEST USE AS DIRECTED   ONE TOUCH ULTRA TEST test strip Generic drug: glucose blood USE AS DIRECTED   HYDROcodone-acetaminophen 5-325 MG tablet Commonly known as: NORCO/VICODIN Take 1-2 tablets by mouth every 6 (six) hours as needed for moderate pain.   levothyroxine 88 MCG tablet Commonly known as: SYNTHROID TAKE 1 TABLET BY MOUTH EVERY DAY ON EMPTY STOMACH WITH WATER AT LEAST 30-60 MINS BEFORE BREAKFAST What changed: See the new instructions.   losartan 50 MG tablet Commonly known as: COZAAR TAKE 1 TABLET BY MOUTH EVERY DAY   meloxicam 7.5 MG tablet Commonly known as: MOBIC Take 1 tablet (7.5 mg total) by mouth daily. As needed for knee pain What changed:   when to take this  reasons to take this  additional instructions   metFORMIN 500 MG 24 hr tablet Commonly known as: GLUCOPHAGE-XR Take 1 tablet (500 mg total) by mouth daily with breakfast. What changed: when to take this   oxybutynin 5 MG tablet Commonly known as: DITROPAN Take 1 tablet (5 mg total) by mouth every 8 (eight) hours as needed for bladder spasms.   pioglitazone 15 MG tablet Commonly known as: ACTOS TAKE 1 TABLET BY MOUTH EVERY DAY What changed: when to take this   tamsulosin 0.4 MG Caps capsule Commonly known as: Flomax Take 1 capsule (0.4 mg total) by mouth daily.       Allergies:  Allergies  Allergen Reactions  . Latex Rash  . Nickel Rash    Family History: Family History  Problem Relation Age of Onset  . Hypertension Mother   . Stroke Mother 46       hemorrhagic  . Heart disease Father   . Cancer Father        Lung CA,  died of AMI while in  [place] getting Laetril  . Diabetes Brother   . Hypertension Brother   . Hypertension Son   . Heart disease Maternal Grandfather   . Cancer Maternal Grandfather   . Breast cancer Maternal Grandmother 11  . Depression Sister     Social History:   reports that she has never smoked. She has never used smokeless tobacco. She reports that she does not drink alcohol or use drugs.  Physical Exam: BP 134/75   Pulse (!) 55   Constitutional:  Alert and oriented, no acute distress, nontoxic appearing HEENT: [place], AT Cardiovascular: No clubbing, cyanosis, or edema Respiratory: Normal respiratory effort, no increased work of breathing Skin: No rashes, bruises or suspicious lesions Neurologic: Grossly intact, no focal deficits, moving all 4 extremities Psychiatric: Normal mood and affect  Laboratory Data: Results for orders placed or performed in visit on  12/11/19  BLADDER SCAN AMB NON-IMAGING  Result Value Ref Range   Scan Result 84 ML    Assessment & Plan:   1. Right kidney stone 79 year old female s/p right ureteroscopy and laser lithotripsy with stent placement.  She removed her tethered ureteral stent at home today and subsequently developed RLQ pain, nausea, chills, and the inability to urinate for 2 hours.  Symptoms have improved significantly with p.o. intake while in clinic.  PVR WNL.  UA reassuring for infection.  I explained the patient that I suspect she developed ureteral spasms following stent removal and that her other symptoms can be explained by  taking nausea inducing narcotic pain medications on an empty stomach.  I reassured her that I suspect the symptoms will continue to resolve on their own and then no further indication is currently necessary.  She expressed understanding.  Counseled patient to make sure she eats and stays well-hydrated today and continue oxybutynin for today to manage any residual ureteral spasms.  Counseled her to proceed to the ED over the weekend if  she develops acute return of pain, nausea, vomiting, fever, or chills.  She expressed understanding. - BLADDER SCAN AMB NON-IMAGING - Urinalysis, Complete  Debroah Loop, [facility]  [facility] [address] [place], [place] [address] [phone]

## 2019-12-11 NOTE — Patient Instructions (Signed)
Continue taking oxybutynin today. Stay well hydrated and get something to eat. Go the Emergency Room over the weekend if your pain returns and you develop fever, chills, nausea, and/or vomiting.

## 2019-12-11 NOTE — Progress Notes (Signed)
In and Out Catheterization  Patient is present today for a I & O catheterization for UA, she is unable to urinate. Patient was cleaned and prepped in a sterile fashion with betadine . A 14 red rubber FR cath was inserted no complications were noted , 122ml of urine return was noted, urine was amber/red in color. A clean urine sample was collected for UA. Bladder was drained  And catheter was removed with out difficulty.    Preformed by: Fonnie Jarvis, CMA

## 2019-12-14 ENCOUNTER — Ambulatory Visit
Admission: RE | Admit: 2019-12-14 | Discharge: 2019-12-14 | Disposition: A | Discharge status: Home / Self Care | Payer: Medicare HMO | Source: Ambulatory Visit | Attending: Physician Assistant | Admitting: Physician Assistant

## 2019-12-14 ENCOUNTER — Ambulatory Visit (INDEPENDENT_AMBULATORY_CARE_PROVIDER_SITE_OTHER): Payer: Medicare HMO | Admitting: Physician Assistant

## 2019-12-14 ENCOUNTER — Telehealth: Payer: Self-pay

## 2019-12-14 ENCOUNTER — Other Ambulatory Visit: Payer: Self-pay

## 2019-12-14 ENCOUNTER — Encounter: Payer: Self-pay | Admitting: Physician Assistant

## 2019-12-14 ENCOUNTER — Other Ambulatory Visit: Payer: Self-pay | Admitting: Urology

## 2019-12-14 VITALS — BP 173/93 | HR 56 | Ht 63.0 in | Wt 157.0 lb

## 2019-12-14 DIAGNOSIS — N23 Unspecified renal colic: Secondary | ICD-10-CM

## 2019-12-14 DIAGNOSIS — R339 Retention of urine, unspecified: Secondary | ICD-10-CM

## 2019-12-14 DIAGNOSIS — R3916 Straining to void: Secondary | ICD-10-CM

## 2019-12-14 DIAGNOSIS — K5900 Constipation, unspecified: Secondary | ICD-10-CM | POA: Diagnosis not present

## 2019-12-14 DIAGNOSIS — N3001 Acute cystitis with hematuria: Secondary | ICD-10-CM

## 2019-12-14 LAB — MICROSCOPIC EXAMINATION: WBC, UA: >30 /HPF — ABNORMAL HIGH (ref 0–5)

## 2019-12-14 LAB — URINALYSIS, COMPLETE
Bilirubin, UA: NEGATIVE
Glucose, UA: NEGATIVE
Ketones, UA: NEGATIVE
Nitrite, UA: NEGATIVE
Specific Gravity, UA: 1.02 (ref 1.005–1.030)
Urobilinogen, Ur: 0.2 mg/dL (ref 0.2–1.0)
pH, UA: 5 (ref 5.0–7.5)

## 2019-12-14 LAB — BLADDER SCAN AMB NON-IMAGING: Scan Result: 19

## 2019-12-14 MED ORDER — TAMSULOSIN HCL 0.4 MG PO CAPS: 0.4000 mg | ORAL_CAPSULE | Freq: Every day | ORAL | 0 refills | Status: DC

## 2019-12-14 MED ORDER — OXYBUTYNIN CHLORIDE 5 MG PO TABS: 5.0000 mg | ORAL_TABLET | Freq: Three times a day (TID) | ORAL | 1 refills | Status: DC | PRN

## 2019-12-14 MED ORDER — SULFAMETHOXAZOLE-TRIMETHOPRIM 800-160 MG PO TABS: 1.0000 | ORAL_TABLET | Freq: Two times a day (BID) | ORAL | 0 refills | Status: AC

## 2019-12-14 NOTE — Progress Notes (Signed)
12/14/2019 1:28 PM   [name] 01-26-1941 [id number]  CC: RLQ pain, urgency  HPI: [name] is a 79 y.o. female who presents today for evaluation of RLQ pain and urgency.  She is s/p cystoscopy and right ureteroscopy with laser lithotripsy and stent placement with Dr. Erlene Quan.  I saw her in clinic 3 days ago with reports of severe RLQ pain following at home stent removal.  I counseled her to take oxybutynin as needed for pain management and stay well-hydrated.  Today, she reports continued intermittent severe RLQ pain, most recently as this morning.  She has not been taking oxybutynin consistently, as she suspected her ureteral spasms would have subsided by now.  She did take an oxybutynin this morning and notes that her pain is significantly improved.  She is accompanied today by her granddaughter, who is concerned that she is not fully emptying her bladder.  Additionally, patient reports continued poor appetite since surgery.  She states she is not eating or drinking much and that she has not had a bowel movement yet.  In-office catheterized UA today positive for 3+ blood, 1+ protein, and 1+ leukocyte esterase; urine microscopy with >30 WBCs/HPF, 3-10 RBCs/HPF, and many bacteria. PVR 51mL.  PMH: Past Medical History:  Diagnosis Date  . Anxiety   . Diabetes mellitus   . GERD (gastroesophageal reflux disease)   . Hyperlipidemia   . Hypertension   . Osteoporosis   . Positive colorectal cancer screening using Cologuard test 2019  . Thyroid disease     Surgical History: Past Surgical History:  Procedure Laterality Date  . BREAST CYST ASPIRATION Bilateral 25+ yrs ago  . CATARACT EXTRACTION W/PHACO Right 03/02/2015   Procedure: CATARACT EXTRACTION PHACO AND INTRAOCULAR LENS PLACEMENT ([place]) TORIC LENS;  Surgeon: Leandrew Koyanagi, MD;  Location: [facility];  Service: Ophthalmology;  Laterality: Right;  TORIC  . CATARACT EXTRACTION W/PHACO Left 03/30/2015   Procedure: CATARACT EXTRACTION PHACO AND INTRAOCULAR LENS PLACEMENT ([facility]);  Surgeon: Leandrew Koyanagi, MD;  Location: [facility];  Service: Ophthalmology;  Laterality: Left;  TORIC  DIABETIC - oral meds  . CERVICAL POLYPECTOMY    . COLONOSCOPY  01/2007   Dr Olevia Perches  . COLONOSCOPY WITH PROPOFOL N/A 10/23/2017   Procedure: COLONOSCOPY WITH PROPOFOL;  Surgeon: Robert Bellow, MD;  Location: [facility];  Service: Endoscopy;  Laterality: N/A;  . CYSTOSCOPY W/ RETROGRADES Bilateral 12/07/2019   Procedure: CYSTOSCOPY WITH RETROGRADE PYELOGRAM;  Surgeon: Hollice Espy, MD;  Location: [facility];  Service: Urology;  Laterality: Bilateral;  . CYSTOSCOPY/URETEROSCOPY/HOLMIUM LASER/STENT PLACEMENT Right 12/07/2019   Procedure: CYSTOSCOPY/URETEROSCOPY/HOLMIUM LASER/STENT PLACEMENT;  Surgeon: Hollice Espy, MD;  Location: [facility];  Service: Urology;  Laterality: Right;  . HUMERUS FRACTURE SURGERY    . VAGINAL DELIVERY    . WRIST FRACTURE SURGERY      Home Medications:  Allergies as of 12/14/2019      Reactions   Latex Rash   Nickel Rash      Medication List       Accurate as of December 14, 2019  1:28 PM. If you have any questions, ask your nurse or doctor.        STOP taking these medications   tamsulosin 0.4 MG Caps capsule Commonly known as: Flomax Stopped by: Debroah Loop, PA-C     TAKE these medications   acetaminophen 650 MG CR tablet Commonly known as: TYLENOL Take 650 mg by mouth every 8 (eight) hours as needed for pain.  ALPRAZolam 0.25 MG tablet Commonly known as: XANAX Take 1 tablet (0.25 mg total) by mouth at bedtime as needed. for anxiety What changed:   how much to take  reasons to take this  additional instructions   aspirin 81 MG tablet Take 81 mg by mouth daily.   atorvastatin 20 MG tablet Commonly known as: LIPITOR TAKE 1 TABLET BY MOUTH EVERY DAY   bismuth subsalicylate 99991111 [id number] suspension Commonly known as: PEPTO BISMOL Take  30 mLs by mouth every 6 (six) hours as needed for diarrhea or loose stools.   calcium-vitamin D 500-200 MG-UNIT tablet Commonly known as: OSCAL WITH D Take 1 tablet by mouth daily.   glipiZIDE 10 MG tablet Commonly known as: GLUCOTROL TAKE 1 TABLET BY MOUTH TWICE A DAY BEFORE A MEAL What changed: See the new instructions.   glucose blood test strip Check sugars twice daily,asc[employer] contour micro fill test strips, Dx 250.00   GE100 Blood Glucose Test test strip Generic drug: glucose blood CHECK TWICE A DAY   glucose blood test strip Commonly known as: ONE TOUCH ULTRA TEST USE AS DIRECTED   ONE TOUCH ULTRA TEST test strip Generic drug: glucose blood USE AS DIRECTED   HYDROcodone-acetaminophen 5-325 MG tablet Commonly known as: NORCO/VICODIN Take 1-2 tablets by mouth every 6 (six) hours as needed for moderate pain.   levothyroxine 88 MCG tablet Commonly known as: SYNTHROID TAKE 1 TABLET BY MOUTH EVERY DAY ON EMPTY STOMACH WITH WATER AT LEAST 30-60 MINS BEFORE BREAKFAST What changed: See the new instructions.   losartan 50 MG tablet Commonly known as: COZAAR TAKE 1 TABLET BY MOUTH EVERY DAY   meloxicam 7.5 MG tablet Commonly known as: MOBIC Take 1 tablet (7.5 mg total) by mouth daily. As needed for knee pain What changed:   when to take this  reasons to take this  additional instructions   metFORMIN 500 MG 24 hr tablet Commonly known as: GLUCOPHAGE-XR Take 1 tablet (500 mg total) by mouth daily with breakfast. What changed: when to take this   oxybutynin 5 MG tablet Commonly known as: DITROPAN Take 1 tablet (5 mg total) by mouth every 8 (eight) hours as needed for bladder spasms.   pioglitazone 15 MG tablet Commonly known as: ACTOS TAKE 1 TABLET BY MOUTH EVERY DAY What changed: when to take this   sulfamethoxazole-trimethoprim 800-160 MG tablet Commonly known as: BACTRIM DS Take 1 tablet by mouth 2 (two) times daily for 7 days. Started by: Debroah Loop, PA-C       Allergies:  Allergies  Allergen Reactions  . Latex Rash  . Nickel Rash    Family History: Family History  Problem Relation Age of Onset  . Hypertension Mother   . Stroke Mother 97       hemorrhagic  . Heart disease Father   . Cancer Father        Lung CA,  died of AMI while in [place] getting Laetril  . Diabetes Brother   . Hypertension Brother   . Hypertension Son   . Heart disease Maternal Grandfather   . Cancer Maternal Grandfather   . Breast cancer Maternal Grandmother 27  . Depression Sister     Social History:   reports that she has never smoked. She has never used smokeless tobacco. She reports that she does not drink alcohol or use drugs.  Physical Exam: BP (!) 173/93   Pulse (!) 56   Ht 5\' 3"  (1.6 m)   Wt 157 lb (  71.2 kg)   BMI 27.81 kg/m   Constitutional:  Alert and oriented, no acute distress, nontoxic appearing HEENT: [place], AT Cardiovascular: No clubbing, cyanosis, or edema Respiratory: Normal respiratory effort, no increased work of breathing GI: Distended, full abdomen without rebound, tenderness, or guarding Skin: No rashes, bruises or suspicious lesions Neurologic: Grossly intact, no focal deficits, moving all 4 extremities Psychiatric: Normal mood and affect  Laboratory Data: Results for orders placed or performed in visit on 12/14/19  Microscopic Examination   URINE  Result Value Ref Range   WBC, UA >30 (H) 0 - 5 /hpf   RBC 3-10 (A) 0 - 2 /hpf   Epithelial Cells (non renal) 0-10 0 - 10 /hpf   Bacteria, UA Many (A) None seen/Few  Urinalysis, Complete  Result Value Ref Range   Specific Gravity, UA 1.020 1.005 - 1.030   pH, UA 5.0 5.0 - 7.5   Color, UA Yellow Yellow   Appearance Ur Cloudy (A) Clear   Leukocytes,UA 1+ (A) Negative   Protein,UA 1+ (A) Negative/Trace   Glucose, UA Negative Negative   Ketones, UA Negative Negative   RBC, UA 3+ (A) Negative   Bilirubin, UA Negative Negative   Urobilinogen, Ur 0.2  0.2 - 1.0 mg/dL   Nitrite, UA Negative Negative   Microscopic Examination See below:   Bladder Scan (Post Void Residual) in office  Result Value Ref Range   Scan Result 19    Pertinent imaging: KUB, 12/14/2019: CLINICAL DATA:  Posterior os could be  EXAM: ABDOMEN - 1 VIEW  COMPARISON:  CT 11/03/2019  FINDINGS: No high-grade obstructive bowel gas pattern is seen. There is a large volume of stool throughout the colon some air-filled loops of ascending and transverse colon as well. Ovoid radiodensity projecting in the right upper quadrant may reflect residual calculus as seen on comparison CT 11/03/2019. No other suspicious calcifications along the course of either ureter or overlying the urinary bladder. Mild levocurvature of the thoracolumbar spine. Multilevel discogenic changes are noted. Additional degenerative changes noted in the hips and pelvis.  IMPRESSION: 1. Large volume of stool throughout the colon compatible with constipation. 2. Ovoid radiodensity projecting in the right upper quadrant may reflect residual calculus, in similar location to a calculus on comparison CT 11/03/2019   Electronically Signed   By: Lovena Le M.D.   On: 12/14/2019 19:31  I personally reviewed the imaging above and note a large stool burden.  Assessment & Plan:   1. Ureter pain 79 year old female s/p right URS/LL/stent with continued intermittent RLQ pain following at home stent removal responsive to oxybutynin.  Represcribing oxybutynin for symptom palliation in the coming days.  Obtained KUB for evaluation of possible residual stone fragment, notable solely for stool burden.  See #4 below.  UA grossly infected, see #3 below. - oxybutynin (DITROPAN) 5 MG tablet; Take 1 tablet (5 mg total) by mouth every 8 (eight) hours as needed for bladder spasms.  Dispense: 15 tablet; Refill: 1 - DG Abd 1 View - Urinalysis, Complete  2. Urinary straining PVR WNL today.  No further  intervention indicated. - Bladder Scan (Post Void Residual) in office  3. Acute cystitis with hematuria UA grossly infected today, will send for culture for further evaluation.  Starting patient on empiric Bactrim DS twice daily x7 days.  Counseled patient that I would contact her if her urine culture results indicated a necessary change in therapy. - CULTURE, URINE COMPREHENSIVE - sulfamethoxazole-trimethoprim (BACTRIM DS) 800-160 MG tablet; Take 1  tablet by mouth 2 (two) times daily for 7 days.  Dispense: 14 tablet; Refill: 0  4. Constipation, unspecified constipation type Physical exam notable for abdominal distention and fullness, stool burden noted on KUB today.  Suspect postoperative constipation.  Counseled patient to start a bowel regimen of 2 capfuls of MiraLAX today, followed by 1 capful every day thereafter x1 week.  Return if symptoms worsen or fail to improve.  Debroah Loop, PA-C  [facility] [address], [place], [place] [address] [phone]

## 2019-12-14 NOTE — Patient Instructions (Addendum)
1. Start Miralax two capfuls today, one capful daily thereafter. 2. Drink lots of water and try to eat today. 3. Continue taking oxybutynin up to three times daily to help with your abdominal pain.

## 2019-12-14 NOTE — Telephone Encounter (Signed)
Incoming call from pt's granddaughter who states that the pt is having significant back pain right sided, she had 2 extra strength Tylenol at 3am with no relief. Pt states that she does not feel as though her bladder is emptying well. Pt states that she had to be cathed on Friday in office due to not being able to void. Advised pt that we would see her in office to rule out retention. Pt scheduled for 11:00am.

## 2019-12-15 ENCOUNTER — Ambulatory Visit: Payer: Medicare HMO | Attending: Internal Medicine

## 2019-12-15 DIAGNOSIS — Z23 Encounter for immunization: Secondary | ICD-10-CM

## 2019-12-15 NOTE — Progress Notes (Signed)
   Covid-19 Vaccination Clinic  Name:  [name]    MRN: [id number] DOB: [birth year]  12/15/2019  Ms. Bean was observed post Covid-19 immunization for 15 minutes without incident. She was provided with Vaccine Information Sheet and instruction to access the V-Safe system.   [name] was instructed to call 911 with any severe reactions post vaccine: Marland Kitchen Difficulty breathing  . Swelling of face and throat  . A fast heartbeat  . A bad rash all over body  . Dizziness and weakness   Immunizations Administered    Name Date Dose VIS Date Route   Pfizer COVID-19 Vaccine 12/15/2019 10:19 AM 0.3 mL 08/14/2019 Intramuscular   Manufacturer: [place]   Lot: [id number]   [place]: [id number]

## 2019-12-18 ENCOUNTER — Telehealth: Payer: Self-pay | Admitting: Physician Assistant

## 2019-12-18 LAB — CULTURE, URINE COMPREHENSIVE

## 2019-12-18 NOTE — Telephone Encounter (Signed)
I just spoke with the patient via telephone.  I explained that her urine culture came back negative, she does not have a urinary tract infection.  I counseled her to stop her prescribed Bactrim as she no longer needs it.  She expressed understanding.  She reports feeling better, however she has not regained her full appetite yet.  She feels she is still cleaning out her bowels.  I counseled her to continue daily MiraLAX to assist with this.  She states her RLQ pain has resolved since Monday.  I counseled her to reduce the frequency of oxybutynin as much as possible in light of her improving symptoms.  She expressed understanding.

## 2020-01-03 DIAGNOSIS — R69 Illness, unspecified: Secondary | ICD-10-CM | POA: Diagnosis not present

## 2020-01-04 ENCOUNTER — Ambulatory Visit: Payer: Medicare HMO | Admitting: Physician Assistant

## 2020-01-07 ENCOUNTER — Ambulatory Visit
Admission: RE | Admit: 2020-01-07 | Discharge: 2020-01-07 | Disposition: A | Discharge status: Home / Self Care | Payer: Medicare HMO | Source: Ambulatory Visit | Attending: Urology | Admitting: Urology

## 2020-01-07 ENCOUNTER — Other Ambulatory Visit: Payer: Self-pay

## 2020-01-07 DIAGNOSIS — N200 Calculus of kidney: Secondary | ICD-10-CM | POA: Diagnosis present

## 2020-01-07 DIAGNOSIS — R310 Gross hematuria: Secondary | ICD-10-CM

## 2020-01-07 DIAGNOSIS — N281 Cyst of kidney, acquired: Secondary | ICD-10-CM | POA: Diagnosis not present

## 2020-01-11 ENCOUNTER — Ambulatory Visit (INDEPENDENT_AMBULATORY_CARE_PROVIDER_SITE_OTHER): Payer: Medicare HMO | Admitting: Physician Assistant

## 2020-01-11 ENCOUNTER — Other Ambulatory Visit: Payer: Self-pay

## 2020-01-11 ENCOUNTER — Encounter: Payer: Self-pay | Admitting: Physician Assistant

## 2020-01-11 VITALS — BP 161/67 | HR 56 | Ht 63.0 in | Wt 155.0 lb

## 2020-01-11 DIAGNOSIS — N200 Calculus of kidney: Secondary | ICD-10-CM | POA: Diagnosis not present

## 2020-01-11 DIAGNOSIS — N2889 Other specified disorders of kidney and ureter: Secondary | ICD-10-CM | POA: Diagnosis not present

## 2020-01-11 NOTE — Progress Notes (Signed)
01/11/2020 10:59 AM   [name] 08/07/1941 [id number]  CC: Postop URS/LL/stent, follow-up right renal mass  HPI: [name] is a 79 y.o. female who presents for postoperative evaluation s/p cystoscopy and right ureteroscopy with laser lithotripsy and stent placement with Dr. Erlene Quan on 12/07/2019 for management of a 7 mm right renal pelvic stone, also for further evaluation of a 5 mm indeterminate right renal mass visualized on CT dated 11/03/2019.  She is also known to have a 6 x 5 cm left upper pole renal cyst.  Postoperative period notable for ureteral spasms following at home stent removal and constipation.  Follow-up ultrasound on 01/07/2020 revealed no hydronephrosis and clearance of the right renal pelvic stone.  No stone analysis data available; no prior history of nephrolithiasis.  Previously visualized right renal mass not further characterized on renal ultrasound.  Previously visualized left renal cyst consistent with simple cyst.  Today, patient reports feeling well.  She denies gross hematuria.  No acute concerns.  PMH: Past Medical History:  Diagnosis Date  . Anxiety   . Diabetes mellitus   . GERD (gastroesophageal reflux disease)   . Hyperlipidemia   . Hypertension   . Osteoporosis   . Positive colorectal cancer screening using Cologuard test 2019  . Thyroid disease     Surgical History: Past Surgical History:  Procedure Laterality Date  . BREAST CYST ASPIRATION Bilateral 25+ yrs ago  . CATARACT EXTRACTION W/PHACO Right 03/02/2015   Procedure: CATARACT EXTRACTION PHACO AND INTRAOCULAR LENS PLACEMENT ([place]) TORIC LENS;  Surgeon: Leandrew Koyanagi, MD;  Location: [place];  Service: Ophthalmology;  Laterality: Right;  TORIC  . CATARACT EXTRACTION W/PHACO Left 03/30/2015   Procedure: CATARACT EXTRACTION PHACO AND INTRAOCULAR LENS PLACEMENT ([facility]);  Surgeon: Leandrew Koyanagi, MD;  Location: [place];  Service: Ophthalmology;  Laterality:  Left;  TORIC  DIABETIC - oral meds  . CERVICAL POLYPECTOMY    . COLONOSCOPY  01/2007   Dr Olevia Perches  . COLONOSCOPY WITH PROPOFOL N/A 10/23/2017   Procedure: COLONOSCOPY WITH PROPOFOL;  Surgeon: Robert Bellow, MD;  Location: [facility];  Service: Endoscopy;  Laterality: N/A;  . CYSTOSCOPY W/ RETROGRADES Bilateral 12/07/2019   Procedure: CYSTOSCOPY WITH RETROGRADE PYELOGRAM;  Surgeon: Hollice Espy, MD;  Location: [facility];  Service: Urology;  Laterality: Bilateral;  . CYSTOSCOPY/URETEROSCOPY/HOLMIUM LASER/STENT PLACEMENT Right 12/07/2019   Procedure: CYSTOSCOPY/URETEROSCOPY/HOLMIUM LASER/STENT PLACEMENT;  Surgeon: Hollice Espy, MD;  Location: [facility];  Service: Urology;  Laterality: Right;  . HUMERUS FRACTURE SURGERY    . VAGINAL DELIVERY    . WRIST FRACTURE SURGERY      Home Medications:  Allergies as of 01/11/2020      Reactions   Latex Rash   Nickel Rash      Medication List       Accurate as of Jan 11, 2020 10:59 AM. If you have any questions, ask your nurse or doctor.        acetaminophen 650 MG CR tablet Commonly known as: TYLENOL Take 650 mg by mouth every 8 (eight) hours as needed for pain.   ALPRAZolam 0.25 MG tablet Commonly known as: XANAX Take 1 tablet (0.25 mg total) by mouth at bedtime as needed. for anxiety What changed:   how much to take  reasons to take this  additional instructions   aspirin 81 MG tablet Take 81 mg by mouth daily.   atorvastatin 20 MG tablet Commonly known as: LIPITOR TAKE 1 TABLET BY MOUTH EVERY DAY  bismuth subsalicylate 99991111 [id number] suspension Commonly known as: PEPTO BISMOL Take 30 mLs by mouth every 6 (six) hours as needed for diarrhea or loose stools.   calcium-vitamin D 500-200 MG-UNIT tablet Commonly known as: OSCAL WITH D Take 1 tablet by mouth daily.   glipiZIDE 10 MG tablet Commonly known as: GLUCOTROL TAKE 1 TABLET BY MOUTH TWICE A DAY BEFORE A MEAL What changed: See the new instructions.   glucose  blood test strip Check sugars twice daily,ascensia contour micro fill test strips, Dx 250.00   GE100 Blood Glucose Test test strip Generic drug: glucose blood CHECK TWICE A DAY   glucose blood test strip Commonly known as: ONE TOUCH ULTRA TEST USE AS DIRECTED   ONE TOUCH ULTRA TEST test strip Generic drug: glucose blood USE AS DIRECTED   HYDROcodone-acetaminophen 5-325 MG tablet Commonly known as: NORCO/VICODIN Take 1-2 tablets by mouth every 6 (six) hours as needed for moderate pain.   levothyroxine 88 MCG tablet Commonly known as: SYNTHROID TAKE 1 TABLET BY MOUTH EVERY DAY ON EMPTY STOMACH WITH WATER AT LEAST 30-60 MINS BEFORE BREAKFAST What changed: See the new instructions.   losartan 50 MG tablet Commonly known as: COZAAR TAKE 1 TABLET BY MOUTH EVERY DAY   meloxicam 7.5 MG tablet Commonly known as: MOBIC Take 1 tablet (7.5 mg total) by mouth daily. As needed for knee pain What changed:   when to take this  reasons to take this  additional instructions   metFORMIN 500 MG 24 hr tablet Commonly known as: GLUCOPHAGE-XR Take 1 tablet (500 mg total) by mouth daily with breakfast. What changed: when to take this   oxybutynin 5 MG tablet Commonly known as: DITROPAN Take 1 tablet (5 mg total) by mouth every 8 (eight) hours as needed for bladder spasms.   pioglitazone 15 MG tablet Commonly known as: ACTOS TAKE 1 TABLET BY MOUTH EVERY DAY What changed: when to take this   tamsulosin 0.4 MG Caps capsule Commonly known as: FLOMAX Take 0.4 mg by mouth daily.       Allergies:  Allergies  Allergen Reactions  . Latex Rash  . Nickel Rash    Family History: Family History  Problem Relation Age of Onset  . Hypertension Mother   . Stroke Mother 15       hemorrhagic  . Heart disease Father   . Cancer Father        Lung CA,  died of AMI while in [place] getting Laetril  . Diabetes Brother   . Hypertension Brother   . Hypertension Son   . Heart disease  Maternal Grandfather   . Cancer Maternal Grandfather   . Breast cancer Maternal Grandmother 86  . Depression Sister     Social History:  reports that she has never smoked. She has never used smokeless tobacco. She reports that she does not drink alcohol or use drugs.  Physical Exam: BP (!) 161/67   Pulse (!) 56   Ht 5\' 3"  (1.6 m)   Wt 155 lb (70.3 kg)   BMI 27.46 kg/m   Constitutional:  Alert and oriented, No acute distress. HEENT: [place] AT, moist mucus membranes.  Trachea midline, no masses. Cardiovascular: No clubbing, cyanosis, or edema. Respiratory: Normal respiratory effort, no increased work of breathing. Skin: No rashes, bruises or suspicious lesions. Neurologic: Grossly intact, no focal deficits, moving all 4 extremities. Psychiatric: Normal mood and affect.  Pertinent Imaging: Results for orders placed during the hospital encounter of 01/07/20  Ultrasound renal  complete   Narrative CLINICAL DATA:  Hematuria.  Right kidney stone.  EXAM: RENAL / URINARY TRACT ULTRASOUND COMPLETE  COMPARISON:  CT stone study 11/03/2019  FINDINGS: Right Kidney:  Renal measurements: 11.4 x 4.7 x 5.3 cm = volume: 148 mL. Right pelvic stone seen on CT of 11/03/2019 is not visible by ultrasound today. No hydronephrosis.  Left Kidney:  Renal measurements: 10.4 x 6.1 x 4.4 cm = volume: 144 mL. No hydronephrosis. 6.3 cm cyst noted upper pole left kidney with a second tiny 13 mm lower pole cyst, similar to previous CT.  Bladder:  Appears normal for degree of bladder distention.  Other:  None.  IMPRESSION: 1. No hydronephrosis. Stone seen in the right renal pelvis on previous CT scan is not evident by ultrasound today. 2. Left renal cysts.   Electronically Signed   By: Misty Stanley M.D.   On: 01/07/2020 15:43    I personally reviewed the images referenced above and note the absence of a right renal pelvic stone.  Assessment & Plan:   79 year old female s/p URS/LL/stent  placement for management of a 7 mm nonobstructing right renal pelvic stone as well as with a history of an indeterminate 5 mm right renal lesion presents today for postop follow-up. 1. Right kidney stone No hydronephrosis or residual debris on renal ultrasound.  No history of prior kidney stones.  No stone analysis data available.  I counseled the patient on general stone prevention techniques including increasing water intake with a goal of producing 2.5L of urine daily, increased citric acid intake, avoidance of high oxalate-containing foods, maintaining moderate dietary calcium intake, and decreasing salt intake.  Written and verbal information provided today.  2. Renal mass Not further characterized on postop renal ultrasound.  Counseled patient that I recommend CT AP with contrast for definitive evaluation.  She expressed understanding. - CT Abdomen Pelvis W Contrast   Return Will call with CT results.  Debroah Loop, PA-C  [facility] [address] [place], [place] [address] [phone]

## 2020-01-14 ENCOUNTER — Other Ambulatory Visit: Payer: Self-pay | Admitting: Physician Assistant

## 2020-01-14 DIAGNOSIS — N281 Cyst of kidney, acquired: Secondary | ICD-10-CM

## 2020-01-14 NOTE — Progress Notes (Signed)
Per insurance, placed order for CT abdomen w/ and w/o contrast rather than planned CT AP w/ contrast. Alternative scan is appropriate for further characterization of right renal lesion.

## 2020-01-22 ENCOUNTER — Other Ambulatory Visit: Payer: Self-pay | Admitting: Urology

## 2020-01-22 DIAGNOSIS — N23 Unspecified renal colic: Secondary | ICD-10-CM

## 2020-01-25 ENCOUNTER — Ambulatory Visit
Admission: RE | Admit: 2020-01-25 | Discharge: 2020-01-25 | Disposition: A | Discharge status: Home / Self Care | Payer: Medicare HMO | Source: Ambulatory Visit | Attending: Physician Assistant | Admitting: Physician Assistant

## 2020-01-25 ENCOUNTER — Other Ambulatory Visit: Payer: Self-pay

## 2020-01-25 DIAGNOSIS — N200 Calculus of kidney: Secondary | ICD-10-CM | POA: Diagnosis not present

## 2020-01-25 DIAGNOSIS — N281 Cyst of kidney, acquired: Secondary | ICD-10-CM | POA: Insufficient documentation

## 2020-01-25 LAB — POCT I-STAT CREATININE: Creatinine, Ser: 0.7 mg/dL (ref 0.44–1.00)

## 2020-01-25 MED ORDER — IOHEXOL 300 MG/ML  SOLN
100.0000 mL | Freq: Once | INTRAMUSCULAR | Status: AC | PRN
  Administered 2020-01-25: 100 mL via INTRAVENOUS

## 2020-01-26 ENCOUNTER — Telehealth: Payer: Self-pay | Admitting: Physician Assistant

## 2020-01-26 NOTE — Telephone Encounter (Signed)
Please contact the patient and inform her that her CT scan showed that her right renal lesion is a simple cyst. This is excellent news. This represents a benign finding that does not require follow-up.  Please offer her a one year stone follow-up with [facility] prior; alternatively, she may follow up with [place] as needed.

## 2020-01-27 NOTE — Telephone Encounter (Signed)
Notified patient as advised. Patient verbalized understanding. Patient would like to follow up in a year for surveillance. Appt made, reminder printed and mailed.

## 2020-02-02 DIAGNOSIS — R69 Illness, unspecified: Secondary | ICD-10-CM | POA: Diagnosis not present

## 2020-02-04 ENCOUNTER — Encounter: Payer: Self-pay | Admitting: Nurse Practitioner

## 2020-02-04 ENCOUNTER — Ambulatory Visit (INDEPENDENT_AMBULATORY_CARE_PROVIDER_SITE_OTHER): Payer: Medicare HMO | Admitting: Nurse Practitioner

## 2020-02-04 ENCOUNTER — Other Ambulatory Visit: Payer: Self-pay

## 2020-02-04 VITALS — BP 136/60 | HR 53 | Temp 97.1°F | Ht 63.0 in | Wt 157.6 lb

## 2020-02-04 DIAGNOSIS — S81051A Open bite, right knee, initial encounter: Secondary | ICD-10-CM | POA: Diagnosis not present

## 2020-02-04 DIAGNOSIS — S80261A Insect bite (nonvenomous), right knee, initial encounter: Secondary | ICD-10-CM | POA: Diagnosis not present

## 2020-02-04 DIAGNOSIS — L03818 Cellulitis of other sites: Secondary | ICD-10-CM | POA: Diagnosis not present

## 2020-02-04 DIAGNOSIS — W57XXXA Bitten or stung by nonvenomous insect and other nonvenomous arthropods, initial encounter: Secondary | ICD-10-CM

## 2020-02-04 MED ORDER — DOXYCYCLINE HYCLATE 100 MG PO TABS: 100.0000 mg | ORAL_TABLET | Freq: Two times a day (BID) | ORAL | 0 refills | Status: DC

## 2020-02-04 NOTE — Progress Notes (Signed)
Established Patient Office Visit  Subjective:  Patient ID: [name], female    DOB: 06/03/1941  Age: 79 y.o. MRN: [id number]  CC:  Chief Complaint  Patient presents with  . Acute Visit    tick on back of knee    HPI [name] presents for a tick bite on the back of her right knee that she felt last night while taking a bath. She mowed the yard earlier that day. She does not think it was there previously. Her DTR and Granddaughter tried to get it out with multiple tries with a tweezers and could not get the tick removed. Her granddaughter thinks the head is still imbedded.  The patient has noted no pain to the site, fevers, chills, headache, rash, or myalgias.  Past Medical History:  Diagnosis Date  . Anxiety   . Diabetes mellitus   . GERD (gastroesophageal reflux disease)   . Hyperlipidemia   . Hypertension   . Osteoporosis   . Positive colorectal cancer screening using Cologuard test 2019  . Thyroid disease     Past Surgical History:  Procedure Laterality Date  . BREAST CYST ASPIRATION Bilateral 25+ yrs ago  . CATARACT EXTRACTION W/PHACO Right 03/02/2015   Procedure: CATARACT EXTRACTION PHACO AND INTRAOCULAR LENS PLACEMENT ([place]) TORIC LENS;  Surgeon: Leandrew Koyanagi, MD;  Location: [place];  Service: Ophthalmology;  Laterality: Right;  TORIC  . CATARACT EXTRACTION W/PHACO Left 03/30/2015   Procedure: CATARACT EXTRACTION PHACO AND INTRAOCULAR LENS PLACEMENT ([facility]);  Surgeon: Leandrew Koyanagi, MD;  Location: [place];  Service: Ophthalmology;  Laterality: Left;  TORIC  DIABETIC - oral meds  . CERVICAL POLYPECTOMY    . COLONOSCOPY  01/2007   Dr Olevia Perches  . COLONOSCOPY WITH PROPOFOL N/A 10/23/2017   Procedure: COLONOSCOPY WITH PROPOFOL;  Surgeon: Robert Bellow, MD;  Location: [facility];  Service: Endoscopy;  Laterality: N/A;  . CYSTOSCOPY W/ RETROGRADES Bilateral 12/07/2019   Procedure: CYSTOSCOPY WITH RETROGRADE PYELOGRAM;  Surgeon:  Hollice Espy, MD;  Location: [facility];  Service: Urology;  Laterality: Bilateral;  . CYSTOSCOPY/URETEROSCOPY/HOLMIUM LASER/STENT PLACEMENT Right 12/07/2019   Procedure: CYSTOSCOPY/URETEROSCOPY/HOLMIUM LASER/STENT PLACEMENT;  Surgeon: Hollice Espy, MD;  Location: [facility];  Service: Urology;  Laterality: Right;  . HUMERUS FRACTURE SURGERY    . VAGINAL DELIVERY    . WRIST FRACTURE SURGERY      Family History  Problem Relation Age of Onset  . Hypertension Mother   . Stroke Mother 7       hemorrhagic  . Heart disease Father   . Cancer Father        Lung CA,  died of AMI while in [place] getting Laetril  . Diabetes Brother   . Hypertension Brother   . Hypertension Son   . Heart disease Maternal Grandfather   . Cancer Maternal Grandfather   . Breast cancer Maternal Grandmother 56  . Depression Sister     Social History   Socioeconomic History  . Marital status: Widowed    Spouse name: Not on file  . Number of children: 2  . Years of education: Not on file  . Highest education level: Not on file  Occupational History  . Occupation: Doctor, hospital: RETIRED  Tobacco Use  . Smoking status: Never Smoker  . Smokeless tobacco: Never Used  Substance and Sexual Activity  . Alcohol use: No  . Drug use: No  . Sexual activity: Never  Other Topics Concern  . Not  on file  Social History Narrative   Widowed in spring of 2012   Lost brother also   Has living will   Son and daughter hold health care POA   Would accept resuscitation but no prolonged artificial ventilation   Not sure about feeding tube   Social Determinants of Health   Financial Resource Strain:   . Difficulty of Paying Living Expenses:   Food Insecurity:   . Worried About Charity fundraiser in the Last Year:   . Arboriculturist in the Last Year:   Transportation Needs:   . Film/video editor (Medical):   Marland Kitchen Lack of Transportation (Non-Medical):   Physical Activity:   . Days of  Exercise per Week:   . Minutes of Exercise per Session:   Stress:   . Feeling of Stress :   Social Connections:   . Frequency of Communication with Friends and Family:   . Frequency of Social Gatherings with Friends and Family:   . Attends Religious Services:   . Active Member of Clubs or Organizations:   . Attends Archivist Meetings:   Marland Kitchen Marital Status:   Intimate Partner Violence:   . Fear of Current or Ex-Partner:   . Emotionally Abused:   Marland Kitchen Physically Abused:   . Sexually Abused:     Outpatient Medications Prior to Visit  Medication Sig Dispense Refill  . ALPRAZolam (XANAX) 0.25 MG tablet Take 1 tablet (0.25 mg total) by mouth at bedtime as needed. for anxiety (Patient taking differently: Take 0.125 mg by mouth at bedtime as needed for anxiety. ) 30 tablet 5  . aspirin 81 MG tablet Take 81 mg by mouth daily.    Marland Kitchen atorvastatin (LIPITOR) 20 MG tablet TAKE 1 TABLET BY MOUTH EVERY DAY (Patient taking differently: Take 20 mg by mouth daily. ) 90 tablet 1  . calcium-vitamin D (OSCAL WITH D) 500-200 MG-UNIT per tablet Take 1 tablet by mouth daily.     . GE100 BLOOD GLUCOSE TEST test strip CHECK TWICE A DAY 100 each 1  . glipiZIDE (GLUCOTROL) 10 MG tablet TAKE 1 TABLET BY MOUTH TWICE A DAY BEFORE A MEAL (Patient taking differently: Take 10 mg by mouth 2 (two) times daily before a meal. ) 180 tablet 1  . levothyroxine (SYNTHROID) 88 MCG tablet TAKE 1 TABLET BY MOUTH EVERY DAY ON EMPTY STOMACH WITH WATER AT LEAST 30-60 MINS BEFORE BREAKFAST (Patient taking differently: Take 88 mcg by mouth daily before breakfast. ) 90 tablet 1  . losartan (COZAAR) 50 MG tablet TAKE 1 TABLET BY MOUTH EVERY DAY (Patient taking differently: Take 50 mg by mouth daily. ) 90 tablet 3  . meloxicam (MOBIC) 7.5 MG tablet Take 1 tablet (7.5 mg total) by mouth daily. As needed for knee pain (Patient taking differently: Take 7.5 mg by mouth daily as needed (knee pain). ) 90 tablet 1  . metFORMIN (GLUCOPHAGE-XR)  500 MG 24 hr tablet Take 1 tablet (500 mg total) by mouth daily with breakfast. (Patient taking differently: Take 500 mg by mouth daily with lunch. ) 90 tablet 3  . pioglitazone (ACTOS) 15 MG tablet TAKE 1 TABLET BY MOUTH EVERY DAY (Patient taking differently: Take 15 mg by mouth daily with supper. ) 90 tablet 0  . acetaminophen (TYLENOL) 650 MG CR tablet Take 650 mg by mouth every 8 (eight) hours as needed for pain.    Marland Kitchen bismuth subsalicylate (PEPTO BISMOL) 262 MG/15ML suspension Take 30 mLs by mouth every  6 (six) hours as needed for diarrhea or loose stools.    Marland Kitchen glucose blood (ONE TOUCH ULTRA TEST) test strip USE AS DIRECTED 100 each 1  . glucose blood test strip Check sugars twice daily,ascensia contour micro fill test strips, Dx 250.00 100 each 5  . HYDROcodone-acetaminophen (NORCO/VICODIN) 5-325 MG tablet Take 1-2 tablets by mouth every 6 (six) hours as needed for moderate pain. 10 tablet 0  . ONE TOUCH ULTRA TEST test strip USE AS DIRECTED 100 each 12  . oxybutynin (DITROPAN) 5 MG tablet TAKE 1 TABLET (5 MG TOTAL) BY MOUTH EVERY 8 (EIGHT) HOURS AS NEEDED FOR BLADDER SPASMS. 30 tablet 3  . tamsulosin (FLOMAX) 0.4 MG CAPS capsule Take 0.4 mg by mouth daily.     No facility-administered medications prior to visit.    Allergies  Allergen Reactions  . Latex Rash  . Nickel Rash    ROS Review of Systems  Pertinent review of systems as noted in the history of present illness.   Objective:    Physical Exam  Constitutional: She is oriented to person, place, and time. She appears well-developed and well-nourished.  HENT:  Head: Normocephalic.  Cardiovascular: Normal rate and regular rhythm.  Pulmonary/Chest: Effort normal and breath sounds normal.  Musculoskeletal:     Cervical back: Normal range of motion and neck supple.  Neurological: She is alert and oriented to person, place, and time.  Skin: Skin is warm and dry.  Posterior right knee in a decrease is a black spot.  Looking at  this through the ophthalmoscope which is magnified and lighted, I do believe there could be a tick and scab present.  There is slight swelling and erythema  present.  Vitals reviewed.      BP 136/60 (BP Location: Left Arm, Patient Position: Sitting, Cuff Size: Normal)   Pulse (!) 53   Temp (!) 97.1 F (36.2 C) (Skin)   Ht 5\' 3"  (1.6 m)   Wt 157 lb 9.6 oz (71.5 kg)   SpO2 99%   BMI 27.92 kg/m  Wt Readings from Last 3 Encounters:  02/04/20 157 lb 9.6 oz (71.5 kg)  01/11/20 155 lb (70.3 kg)  12/14/19 157 lb (71.2 kg)     Health Maintenance Due  Topic Date Due  . FOOT EXAM  02/22/2019    There are no preventive care reminders to display for this patient.  Lab Results  Component Value Date   TSH 1.47 03/20/2019   Lab Results  Component Value Date   WBC 6.1 11/25/2019   HGB 12.1 11/25/2019   HCT 36.5 11/25/2019   MCV 89.0 11/25/2019   PLT 152 11/25/2019   Lab Results  Component Value Date   NA 140 11/25/2019   K 4.5 11/25/2019   CO2 26 11/25/2019   GLUCOSE 113 (H) 11/25/2019   BUN 28 (H) 11/25/2019   CREATININE 0.70 01/25/2020   BILITOT 0.5 08/24/2019   ALKPHOS 92 08/24/2019   AST 15 08/24/2019   ALT 14 08/24/2019   PROT 6.8 08/24/2019   ALBUMIN 4.3 08/24/2019   CALCIUM 9.4 11/25/2019   ANIONGAP 9 11/25/2019   GFR 74.75 08/24/2019   Lab Results  Component Value Date   CHOL 129 03/20/2019   Lab Results  Component Value Date   HDL 55.10 03/20/2019   Lab Results  Component Value Date   LDLCALC 52 03/20/2019   Lab Results  Component Value Date   TRIG 113.0 03/20/2019   Lab Results  Component Value Date  CHOLHDL 2 03/20/2019   Lab Results  Component Value Date   HGBA1C 6.0 08/24/2019      Assessment & Plan:   Problem List Items Addressed This Visit      Musculoskeletal and Integument   Tick bite of knee, right, initial encounter - Primary      Meds ordered this encounter  Medications  . doxycycline (VIBRA-TABS) 100 MG tablet     Sig: Take 1 tablet (100 mg total) by mouth 2 (two) times daily.    Dispense:  14 tablet    Refill:  0    Order Specific Question:   Supervising Provider    Answer:   Einar Pheasant [id number]   You have an appointment at 3: 16 today with Dr. Hervey Ard who is a general surgeon.  He will be able to look at that tick bite site, and remove any remaining  tick.   His office is at [address].  Look for a building called [facility].  This is in the hospital loop.   You will see a sign at the driveway that says [facility], [facility].  He is on the second floor.  I am giving you doxycycline 100 mg twice daily for 7 days which treats cellulitis and any tick born disease.  Please call [place] if you develop any fevers/chill/worsening skin redness/pain or swelling.   Follow-up: Return if symptoms worsen or fail to improve.    Denice Paradise, NP

## 2020-02-04 NOTE — Patient Instructions (Addendum)
You have an appointment at 3: 61 today with Dr. Hervey Ard who is a Education officer, environmental.  He will be able to look at that tick bite site, and remove any remaining  tick.   His office is at [address].  Look for a building called [employer].  This is in the hospital loop.   You will see a sign at the driveway that says [facility], [facility].  He is on the second floor.  I am giving you doxycycline 100 mg twice daily for 7 days which treats cellulitis and any tick born disease.  Please call [place] if you develop any fevers/chill/worsening skin redness/pain or swelling.   Tick Bite Information, Adult  Ticks are insects that draw blood for food. Most ticks live in shrubs and grassy areas. They climb onto people and animals that brush against the leaves and grasses that they rest on. Then they bite, attaching themselves to the skin. Most ticks are harmless, but some ticks carry germs that can spread to a person through a bite and cause a disease. To reduce your risk of getting a disease from a tick bite, it is important to take steps to prevent tick bites. It is also important to check for ticks after being outdoors. If you find that a tick has attached to you, watch for symptoms of disease. How can I prevent tick bites? Take these steps to help prevent tick bites when you are outdoors in an area where ticks are found:  Use insect repellent that has DEET (20% or higher), picaridin, or IR3535 in it. Use it on: ? Skin that is showing. ? The top of your boots. ? Your pant legs. ? Your sleeve cuffs.  For repellent products that contain permethrin, follow product instructions. Use these products on: ? Clothing. ? Gear. ? Boots. ? Tents.  Wear protective clothing. Long sleeves and long pants offer the best protection from ticks.  Wear light-colored clothing so you can see ticks more easily.  Tuck your pant legs into your socks.  If you go  walking on a trail, stay in the middle of the trail so your skin, hair, and clothing do not touch the bushes.  Avoid walking through areas with long grass.  Check for ticks on your clothing, hair, and skin often while you are outside, and check again before you go inside. Make sure to check the places that ticks attach themselves most often. These places include the scalp, neck, armpits, waist, groin, and joint areas. Ticks that carry a disease called Lyme disease have to be attached to the skin for 24-48 hours. Checking for ticks every day will lessen your risk of this and other diseases.  When you come indoors, wash your clothes and take a shower or a bath right away. Dry your clothes in a dryer on high heat for at least 60 minutes. This will kill any ticks in your clothes. What is the proper way to remove a tick? If you find a tick on your body, remove it as soon as possible. Removing a tick sooner rather than later can prevent germs from passing from the tick to your body. To remove a tick that is crawling on your skin but has not bitten:  Go outdoors and brush the tick off.  Remove the tick with tape or a lint roller. To remove a tick that is attached to your skin:  Wash your hands.  If you have latex  gloves, put them on.  Use tweezers, curved forceps, or a tick-removal tool to gently grasp the tick as close to your skin and the tick's head as possible.  Gently pull with steady, upward pressure until the tick lets go. When removing the tick: ? Take care to keep the tick's head attached to its body. ? Do not twist or jerk the tick. This can make the tick's head or mouth break off. ? Do not squeeze or crush the tick's body. This could force disease-carrying fluids from the tick into your body. Do not try to remove a tick with heat, alcohol, petroleum jelly, or fingernail polish. Using these methods can cause the tick to salivate and regurgitate into your bloodstream, increasing your risk  of getting a disease. What should I do after removing a tick?  Clean the bite area with soap and water, rubbing alcohol, or an iodine scrub.  If an antiseptic cream or ointment is available, apply a small amount to the bite site.  Wash and disinfect any instruments that you used to remove the tick. How should I dispose of a tick? To dispose of a live tick, use one of these methods:  Place it in rubbing alcohol.  Place it in a sealed bag or container.  Wrap it tightly in tape.  Flush it down the toilet. Contact a health care provider if:  You have symptoms of a disease after a tick bite. Symptoms of a tick-borne disease can occur from moments after the tick bites to up to 30 days after a tick is removed. Symptoms include: ? Muscle, joint, or bone pain. ? Difficulty walking or moving your legs. ? Numbness in the legs. ? Paralysis. ? Red rash around the tick bite area that is shaped like a target or a "bull's-eye." ? Redness and swelling in the area of the tick bite. ? Fever. ? Repeated vomiting. ? Diarrhea. ? Weight loss. ? Tender, swollen lymph glands. ? Shortness of breath. ? Cough. ? Pain in the abdomen. ? Headache. ? Abnormal tiredness. ? A change in your level of consciousness. ? Confusion. Get help right away if:  You are not able to remove a tick.  A part of a tick breaks off and gets stuck in your skin.  Your symptoms get worse. Summary  Ticks may carry germs that can spread to a person through a bite and cause disease.  Wear protective clothing and use insect repellent to prevent tick bites. Follow product instructions.  If you find a tick on your body, remove it as soon as possible. If the tick is attached, do not try to remove with heat, alcohol, petroleum jelly, or fingernail polish.  Remove the attached tick using tweezers, curved forceps, or a tick-removal tool. Gently pull with steady, upward pressure until the tick lets go. Do not twist or jerk the  tick. Do not squeeze or crush the tick's body.  If you have symptoms after being bitten by a tick, contact a health care provider. This information is not intended to replace advice given to you by your health care provider. Make sure you discuss any questions you have with your health care provider. Document Revised: 08/02/2017 Document Reviewed: 06/01/2016 Elsevier Patient Education  2020 [employer].

## 2020-02-19 ENCOUNTER — Other Ambulatory Visit: Payer: Self-pay | Admitting: Internal Medicine

## 2020-02-22 ENCOUNTER — Other Ambulatory Visit: Payer: Self-pay | Admitting: Internal Medicine

## 2020-03-02 DIAGNOSIS — R69 Illness, unspecified: Secondary | ICD-10-CM | POA: Diagnosis not present

## 2020-03-12 ENCOUNTER — Other Ambulatory Visit: Payer: Self-pay | Admitting: Internal Medicine

## 2020-03-28 DIAGNOSIS — E119 Type 2 diabetes mellitus without complications: Secondary | ICD-10-CM | POA: Diagnosis not present

## 2020-03-28 LAB — HM DIABETES EYE EXAM

## 2020-04-11 ENCOUNTER — Other Ambulatory Visit: Payer: Self-pay

## 2020-04-11 ENCOUNTER — Other Ambulatory Visit (INDEPENDENT_AMBULATORY_CARE_PROVIDER_SITE_OTHER): Payer: Medicare HMO

## 2020-04-11 DIAGNOSIS — R5383 Other fatigue: Secondary | ICD-10-CM

## 2020-04-11 DIAGNOSIS — E034 Atrophy of thyroid (acquired): Secondary | ICD-10-CM | POA: Diagnosis not present

## 2020-04-11 DIAGNOSIS — E118 Type 2 diabetes mellitus with unspecified complications: Secondary | ICD-10-CM

## 2020-04-11 DIAGNOSIS — E1169 Type 2 diabetes mellitus with other specified complication: Secondary | ICD-10-CM

## 2020-04-11 DIAGNOSIS — I10 Essential (primary) hypertension: Secondary | ICD-10-CM

## 2020-04-11 DIAGNOSIS — E785 Hyperlipidemia, unspecified: Secondary | ICD-10-CM | POA: Diagnosis not present

## 2020-04-11 LAB — CBC WITH DIFFERENTIAL/PLATELET
Basophils Absolute: 0 10*3/uL (ref 0.0–0.1)
Basophils Relative: 0.9 % (ref 0.0–3.0)
Eosinophils Absolute: 0.1 10*3/uL (ref 0.0–0.7)
Eosinophils Relative: 3 % (ref 0.0–5.0)
HCT: 36 % (ref 36.0–46.0)
Hemoglobin: 12.5 g/dL (ref 12.0–15.0)
Lymphocytes Relative: 20.4 % (ref 12.0–46.0)
Lymphs Abs: 1 10*3/uL (ref 0.7–4.0)
MCHC: 34.7 g/dL (ref 30.0–36.0)
MCV: 87.6 fl (ref 78.0–100.0)
Monocytes Absolute: 0.5 10*3/uL (ref 0.1–1.0)
Monocytes Relative: 9.7 % (ref 3.0–12.0)
Neutro Abs: 3.2 10*3/uL (ref 1.4–7.7)
Neutrophils Relative %: 66 % (ref 43.0–77.0)
Platelets: 131 10*3/uL — ABNORMAL LOW (ref 150.0–400.0)
RBC: 4.11 Mil/uL (ref 3.87–5.11)
RDW: 14.1 % (ref 11.5–15.5)
WBC: 4.8 10*3/uL (ref 4.0–10.5)

## 2020-04-11 LAB — LIPID PANEL
Cholesterol: 120 mg/dL (ref 0–200)
HDL: 54.6 mg/dL
LDL Cholesterol: 47 mg/dL (ref 0–99)
NonHDL: 65.77
Total CHOL/HDL Ratio: 2
Triglycerides: 95 mg/dL (ref 0.0–149.0)
VLDL: 19 mg/dL (ref 0.0–40.0)

## 2020-04-11 LAB — COMPREHENSIVE METABOLIC PANEL WITH GFR
ALT: 9 U/L (ref 0–35)
AST: 11 U/L (ref 0–37)
Albumin: 4.1 g/dL (ref 3.5–5.2)
Alkaline Phosphatase: 82 U/L (ref 39–117)
BUN: 26 mg/dL — ABNORMAL HIGH (ref 6–23)
CO2: 28 meq/L (ref 19–32)
Calcium: 9.6 mg/dL (ref 8.4–10.5)
Chloride: 107 meq/L (ref 96–112)
Creatinine, Ser: 0.69 mg/dL (ref 0.40–1.20)
GFR: 82.17 mL/min
Glucose, Bld: 105 mg/dL — ABNORMAL HIGH (ref 70–99)
Potassium: 4.3 meq/L (ref 3.5–5.1)
Sodium: 142 meq/L (ref 135–145)
Total Bilirubin: 0.5 mg/dL (ref 0.2–1.2)
Total Protein: 6.2 g/dL (ref 6.0–8.3)

## 2020-04-11 LAB — TSH: TSH: 2.47 u[IU]/mL (ref 0.35–4.50)

## 2020-04-11 LAB — HEMOGLOBIN A1C: Hgb A1c MFr Bld: 5.9 % (ref 4.6–6.5)

## 2020-04-13 ENCOUNTER — Other Ambulatory Visit: Payer: Self-pay

## 2020-04-13 ENCOUNTER — Encounter: Payer: Self-pay | Admitting: Internal Medicine

## 2020-04-13 ENCOUNTER — Ambulatory Visit (INDEPENDENT_AMBULATORY_CARE_PROVIDER_SITE_OTHER): Payer: Medicare HMO | Admitting: Internal Medicine

## 2020-04-13 VITALS — BP 142/78 | HR 63 | Temp 98.1°F | Resp 15 | Ht 63.0 in | Wt 154.8 lb

## 2020-04-13 DIAGNOSIS — M25561 Pain in right knee: Secondary | ICD-10-CM

## 2020-04-13 DIAGNOSIS — E034 Atrophy of thyroid (acquired): Secondary | ICD-10-CM | POA: Diagnosis not present

## 2020-04-13 DIAGNOSIS — M1711 Unilateral primary osteoarthritis, right knee: Secondary | ICD-10-CM

## 2020-04-13 DIAGNOSIS — I700 Atherosclerosis of aorta: Secondary | ICD-10-CM

## 2020-04-13 DIAGNOSIS — E118 Type 2 diabetes mellitus with unspecified complications: Secondary | ICD-10-CM

## 2020-04-13 DIAGNOSIS — G8929 Other chronic pain: Secondary | ICD-10-CM | POA: Diagnosis not present

## 2020-04-13 NOTE — Progress Notes (Signed)
Subjective:  Patient ID: [name], female    DOB: 09-14-1940  Age: 79 y.o. MRN: [id number] .[id number] CC: The primary encounter diagnosis was Primary osteoarthritis of right knee. Diagnoses of DM type 2, controlled, with complication [facility]), Aortic arch atherosclerosis ([place]), Hypothyroidism due to acquired atrophy of thyroid, and Chronic pain of right knee were also pertinent to this visit.  HPI [name] presents for follow up on type 2 DM  This visit occurred during the SARS-CoV-2 public health emergency.  Safety protocols were in place, including screening questions prior to the visit, additional usage of staff PPE, and extensive cleaning of exam room while observing appropriate contact time as indicated for disinfecting solutions.   Patient has received both doses of the available COVID 19 vaccine without complications.  Patient continues to mask when outside of the home except when walking in yard or at safe distances from others .  Patient denies any change in mood or development of unhealthy behaviors resuting from the pandemic's restriction of activities and socialization.    T2DM:  She  feels generally well,  is exercising regularly or trying to lose weight. Checking  blood sugars less once daily at variable times,more  if she feels she may be having a hypoglycemic event. .  BS have been under 100 fasting and < 130 post prandially.  Taking glipizide 10 mg bid ,  Actos  15 mg daily metformin as directed. Following a carbohydrate modified diet 7   days per week. Denies numbness, burning and tingling of extremities. Appetite is good.   BS have been low side for the last 36 hours ,  All under 90    Using mobic for knee pain right side.  Wants to see orthopedics for evaluation and treatment of DJD knee  Reviewed CT scan from [facility] which was done after a renal ultrasound noted renal cysts  .  The CT was  significant for aortic atherosclerosis .  She is taking atorvastatin and aspirin.      Outpatient Medications Prior to Visit  Medication Sig Dispense Refill  . ALPRAZolam (XANAX) 0.25 MG tablet Take 1 tablet (0.25 mg total) by mouth at bedtime as needed. for anxiety (Patient taking differently: Take 0.125 mg by mouth at bedtime as needed for anxiety. ) 30 tablet 5  . aspirin 81 MG tablet Take 81 mg by mouth daily.    Marland Kitchen atorvastatin (LIPITOR) 20 MG tablet TAKE 1 TABLET BY MOUTH EVERY DAY 90 tablet 1  . calcium-vitamin D (OSCAL WITH D) 500-200 MG-UNIT per tablet Take 1 tablet by mouth daily.     . GE100 BLOOD GLUCOSE TEST test strip CHECK TWICE A DAY 100 each 1  . glipiZIDE (GLUCOTROL) 10 MG tablet TAKE 1 TABLET BY MOUTH TWICE A DAY BEFORE A MEAL (Patient taking differently: Take 10 mg by mouth 2 (two) times daily before a meal. ) 180 tablet 1  . levothyroxine (SYNTHROID) 88 MCG tablet TAKE 1 TABLET BY MOUTH EVERY DAY ON EMPTY STOMACH WITH WATER AT LEAST 30-60 MINS BEFORE BREAKFAST (Patient taking differently: Take 88 mcg by mouth daily before breakfast. ) 90 tablet 1  . losartan (COZAAR) 50 MG tablet TAKE 1 TABLET BY MOUTH EVERY DAY (Patient taking differently: Take 50 mg by mouth daily. ) 90 tablet 3  . meloxicam (MOBIC) 7.5 MG tablet TAKE 1 TABLET (7.5 MG TOTAL) BY MOUTH DAILY. AS NEEDED FOR KNEE PAIN 90 tablet 1  . metFORMIN (GLUCOPHAGE-XR) 500 MG 24 hr tablet  Take 1 tablet (500 mg total) by mouth daily with breakfast. (Patient taking differently: Take 500 mg by mouth daily with lunch. ) 90 tablet 3  . pioglitazone (ACTOS) 15 MG tablet Take 1 tablet (15 mg total) by mouth daily with supper. 90 tablet 0  . doxycycline (VIBRA-TABS) 100 MG tablet Take 1 tablet (100 mg total) by mouth 2 (two) times daily. (Patient not taking: Reported on 04/13/2020) 14 tablet 0   No facility-administered medications prior to visit.    Review of Systems;  Patient denies headache, fevers, malaise, unintentional weight loss, skin rash, eye pain, sinus congestion and sinus pain, sore throat,  dysphagia,  hemoptysis , cough, dyspnea, wheezing, chest pain, palpitations, orthopnea, edema, abdominal pain, nausea, melena, diarrhea, constipation, flank pain, dysuria, hematuria, urinary  Frequency, nocturia, numbness, tingling, seizures,  Focal weakness, Loss of consciousness,  Tremor, insomnia, depression, anxiety, and suicidal ideation.      Objective:  BP (!) 142/78 (BP Location: Left Arm, Patient Position: Sitting, Cuff Size: Normal)   Pulse 63   Temp 98.1 F (36.7 C) (Oral)   Resp 15   Ht 5\' 3"  (1.6 m)   Wt 154 lb 12.8 oz (70.2 kg)   SpO2 99%   BMI 27.42 kg/m   BP Readings from Last 3 Encounters:  04/13/20 (!) 142/78  02/04/20 136/60  01/11/20 (!) 161/67    Wt Readings from Last 3 Encounters:  04/13/20 154 lb 12.8 oz (70.2 kg)  02/04/20 157 lb 9.6 oz (71.5 kg)  01/11/20 155 lb (70.3 kg)    General appearance: alert, cooperative and appears stated age Ears: normal TM's and external ear canals both ears Throat: lips, mucosa, and tongue normal; teeth and gums normal Neck: no adenopathy, no carotid bruit, supple, symmetrical, trachea midline and thyroid not enlarged, symmetric, no tenderness/mass/nodules Back: symmetric, no curvature. ROM normal. No CVA tenderness. Lungs: clear to auscultation bilaterally Heart: regular rate and rhythm, S1, S2 normal, no murmur, click, rub or gallop Abdomen: soft, non-tender; bowel sounds normal; no masses,  no organomegaly Pulses: 2+ and symmetric Skin: Skin color, texture, turgor normal. No rashes or lesions Lymph nodes: Cervical, supraclavicular, and axillary nodes normal.  Lab Results  Component Value Date   HGBA1C 5.9 04/11/2020   HGBA1C 6.0 08/24/2019   HGBA1C 6.2 03/20/2019    Lab Results  Component Value Date   CREATININE 0.69 04/11/2020   CREATININE 0.70 01/25/2020   CREATININE 0.64 11/25/2019    Lab Results  Component Value Date   WBC 4.8 04/11/2020   HGB 12.5 04/11/2020   HCT 36.0 04/11/2020   PLT 131.0 (L)  04/11/2020   GLUCOSE 105 (H) 04/11/2020   CHOL 120 04/11/2020   TRIG 95.0 04/11/2020   HDL 54.60 04/11/2020   LDLDIRECT 87.0 02/19/2018   LDLCALC 47 04/11/2020   ALT 9 04/11/2020   AST 11 04/11/2020   NA 142 04/11/2020   K 4.3 04/11/2020   CL 107 04/11/2020   CREATININE 0.69 04/11/2020   BUN 26 (H) 04/11/2020   CO2 28 04/11/2020   TSH 2.47 04/11/2020   HGBA1C 5.9 04/11/2020   MICROALBUR 7.7 (H) 08/24/2019    CT ABDOMEN W WO CONTRAST  Result Date: 01/25/2020 CLINICAL DATA:  Renal cyst on ultrasound EXAM: CT ABDOMEN WITHOUT AND WITH CONTRAST TECHNIQUE: Multidetector CT imaging of the abdomen was performed following the standard protocol before and following the bolus administration of intravenous contrast. CONTRAST:  119mL OMNIPAQUE IOHEXOL 300 MG/ML  SOLN COMPARISON:  Renal ultrasound dated 01/07/2020.  CT abdomen/pelvis dated 11/03/2019. FINDINGS: Lower chest: Lung bases are clear. Hepatobiliary: Liver is within normal limits. Gallbladder is unremarkable. No intrahepatic or extrahepatic ductal dilatation. Pancreas: Within normal limits. Spleen: Within normal limits. Adrenals/Urinary Tract: Adrenal glands are within normal limits. Dominant 6.2 x 5.4 cm left upper pole renal cyst (series 15/image 82), benign (Bosniak I). Additional small bilateral renal cysts measuring up to 12 mm in the lateral left lower pole (series 15/image 91), benign (Bosniak I). No enhancing renal lesions. 2 mm nonobstructing right upper pole renal calculus (coronal image 90). Two nonobstructing right lower pole renal calculi measuring up to 3 mm (series 2/images 94-95). Additional 2 mm nonobstructing left lower pole renal calculus (coronal image 68). No hydronephrosis. Stomach/Bowel: Stomach is notable for a small hiatal hernia. Visualized bowel is notable for left colonic diverticulosis, without evidence of diverticulitis. Vascular/Lymphatic: No evidence of abdominal aortic aneurysm. Atherosclerotic calcifications the  abdominal aorta and branch vessels. No suspicious abdominal lymphadenopathy. Other: No abdominal ascites. Musculoskeletal: Degenerative changes of the visualized thoracolumbar spine. IMPRESSION: Bilateral renal cysts, including a dominant 6.2 cm left upper pole renal cyst, benign (Bosniak I). No enhancing renal lesions. Small nonobstructing bilateral renal calculi measuring up to 3 mm. No hydronephrosis. Electronically Signed   By: Julian Hy M.D.   On: 01/25/2020 15:09    Assessment & Plan:   Problem List Items Addressed This Visit      Unprioritized   Aortic arch atherosclerosis ([place])    Seen on March 2021 CT.  Discussed with patient.  Continue atorvastatin and asa.  Lab Results  Component Value Date   CHOL 120 04/11/2020   HDL 54.60 04/11/2020   LDLCALC 47 04/11/2020   LDLDIRECT 87.0 02/19/2018   TRIG 95.0 04/11/2020   CHOLHDL 2 04/11/2020         DM type 2, controlled, with complication ([facility])    X9B is now < 6.0  And the last 3 fasting sugars and bedtime sugars have been lower than usual.  Reduce evening dose of glipizde to 5 mg .  Continue actos and metformin. Continue asa, statin and ARB  Lab Results  Component Value Date   HGBA1C 5.9 04/11/2020   Lab Results  Component Value Date   LABMICR See below: 12/14/2019   LABMICR See below: 12/11/2019   MICROALBUR 7.7 (H) 08/24/2019   MICROALBUR <0.7 08/22/2018           Hypothyroidism    Thyroid function is WNL on current dose.  No current changes needed.   Lab Results  Component Value Date   TSH 2.47 04/11/2020         Knee pain, chronic    Secondary to DJD , advanced,  BY [employer]MS . S/p I/A injection by [facility] in the past. Her pain is now limiting her activity  , she has requested to see Dr Marry Guan        Other Visit Diagnoses    Primary osteoarthritis of right knee    -  Primary   Relevant Orders   Ambulatory referral to Orthopedic Surgery     I provided  30 minutes of   face-to-face time during this encounter reviewing patient's current problems and past surgeries, labs and imaging studies, providing counseling on the above mentioned problems , and coordination  of care .  I have discontinued Ivin Booty B. Barham's doxycycline. I am also having her maintain her calcium-vitamin D, aspirin, GE100 Blood Glucose Test, ALPRAZolam, metFORMIN, losartan, glipiZIDE, levothyroxine, pioglitazone, atorvastatin,  and meloxicam.  No orders of the defined types were placed in this encounter.   Medications Discontinued During This Encounter  Medication Reason  . doxycycline (VIBRA-TABS) 100 MG tablet Completed Course    Follow-up: Return in about 6 months (around 10/14/2020) for follow up diabetes.   Crecencio Mc, MD

## 2020-04-13 NOTE — Patient Instructions (Addendum)
Reduce your evening dose of glipizide   to 1/2 tablet.  continue full tablet before breakfast   Your a1c is fabulous!  But almost too low  (under 6 means low blood sugars )  But your morning sugars are starting to get too low  Fasting sugars should be 90 to 120   The new goals for optimal blood pressure management are 130/8.  Please check your blood pressure ONCE DAILY FOR 7 DAYS  send me the readings so I can determine if you need a change in medication    Go for a walk once daily for 30 minutes    Muscle cramps can be prevented with :  One ounce of Dill pickle juice or a tsp of mustard before bedtime  For your calcium needs:   I recommend getting the majority of your calcium and Vitamin D  through diet rather than supplements given the recent association of calcium supplements with increased coronary artery calcium scores.  You need 500 mg calcium daily and 1000 IUs of Vit D (more if your level is low today)  And get the rest through your diet  Referral to Dr Marry Guan for your right knee pain

## 2020-04-14 DIAGNOSIS — I700 Atherosclerosis of aorta: Secondary | ICD-10-CM | POA: Insufficient documentation

## 2020-04-14 NOTE — Assessment & Plan Note (Addendum)
Secondary to DJD , advanced,  BY [employer] . S/p I/A injection by [facility] in the past. Her pain is now limiting her activity  , she has requested to see Dr Marry Guan

## 2020-04-14 NOTE — Assessment & Plan Note (Signed)
Thyroid function is WNL on current dose.  No current changes needed.   Lab Results  Component Value Date   TSH 2.47 04/11/2020

## 2020-04-14 NOTE — Assessment & Plan Note (Signed)
A1c is now < 6.0  And the last 3 fasting sugars and bedtime sugars have been lower than usual.  Reduce evening dose of glipizde to 5 mg .  Continue actos and metformin. Continue asa, statin and ARB  Lab Results  Component Value Date   HGBA1C 5.9 04/11/2020   Lab Results  Component Value Date   LABMICR See below: 12/14/2019   LABMICR See below: 12/11/2019   MICROALBUR 7.7 (H) 08/24/2019   MICROALBUR <0.7 08/22/2018

## 2020-04-14 NOTE — Assessment & Plan Note (Signed)
Seen on March 2021 CT.  Discussed with patient.  Continue atorvastatin and asa.  Lab Results  Component Value Date   CHOL 120 04/11/2020   HDL 54.60 04/11/2020   LDLCALC 47 04/11/2020   LDLDIRECT 87.0 02/19/2018   TRIG 95.0 04/11/2020   CHOLHDL 2 04/11/2020

## 2020-04-18 ENCOUNTER — Telehealth: Payer: Self-pay | Admitting: Internal Medicine

## 2020-04-18 DIAGNOSIS — L0889 Other specified local infections of the skin and subcutaneous tissue: Secondary | ICD-10-CM | POA: Diagnosis not present

## 2020-04-18 NOTE — Telephone Encounter (Signed)
Will you triage please 

## 2020-04-18 NOTE — Telephone Encounter (Signed)
Pt states that she has a bump on her hand the size of a grape. Pt has called dermatology and they will not see her. Pt wants to know where to go to get in lanced and wants a call back asap/ Please advise

## 2020-04-18 NOTE — Telephone Encounter (Signed)
Patient stated she has has bump on middle finger and it is swelling bigger this past weekend. She now has swelling across her whole hand, redness, sore to bend, no MOI, no fever, no chills. Has HX of breaking out across her hand. She cant be seen by [place] until tomorrow. She is worried about cellulitis due to her past. She would like to have it lanced. Instructed patient to go to walk in to be evaluated. Patient stated she would comply.

## 2020-04-19 ENCOUNTER — Telehealth: Payer: Self-pay | Admitting: Internal Medicine

## 2020-04-19 ENCOUNTER — Telehealth: Payer: Self-pay

## 2020-04-19 DIAGNOSIS — L02512 Cutaneous abscess of left hand: Secondary | ICD-10-CM | POA: Diagnosis not present

## 2020-04-19 NOTE — Telephone Encounter (Signed)
[facility] called needing a copy of pt's last OV note, labs and most recent EKG faxed over to them. Pt is having surgery on her finger tomorrow. Called pt to verify that this was corrected and that it was okay for me to fax information. Pt gave a verbal consent.

## 2020-04-20 DIAGNOSIS — Z7982 Long term (current) use of aspirin: Secondary | ICD-10-CM | POA: Diagnosis not present

## 2020-04-20 DIAGNOSIS — G8918 Other acute postprocedural pain: Secondary | ICD-10-CM | POA: Diagnosis not present

## 2020-04-20 DIAGNOSIS — L409 Psoriasis, unspecified: Secondary | ICD-10-CM | POA: Diagnosis not present

## 2020-04-20 DIAGNOSIS — Z87891 Personal history of nicotine dependence: Secondary | ICD-10-CM | POA: Diagnosis not present

## 2020-04-20 DIAGNOSIS — E785 Hyperlipidemia, unspecified: Secondary | ICD-10-CM | POA: Diagnosis not present

## 2020-04-20 DIAGNOSIS — L02512 Cutaneous abscess of left hand: Secondary | ICD-10-CM | POA: Diagnosis not present

## 2020-04-20 DIAGNOSIS — M1990 Unspecified osteoarthritis, unspecified site: Secondary | ICD-10-CM | POA: Diagnosis not present

## 2020-04-20 DIAGNOSIS — E079 Disorder of thyroid, unspecified: Secondary | ICD-10-CM | POA: Diagnosis not present

## 2020-04-20 DIAGNOSIS — R69 Illness, unspecified: Secondary | ICD-10-CM | POA: Diagnosis not present

## 2020-04-20 DIAGNOSIS — M79642 Pain in left hand: Secondary | ICD-10-CM | POA: Diagnosis not present

## 2020-04-20 DIAGNOSIS — I10 Essential (primary) hypertension: Secondary | ICD-10-CM | POA: Diagnosis not present

## 2020-04-20 DIAGNOSIS — E119 Type 2 diabetes mellitus without complications: Secondary | ICD-10-CM | POA: Diagnosis not present

## 2020-04-30 ENCOUNTER — Other Ambulatory Visit: Payer: Self-pay | Admitting: Internal Medicine

## 2020-05-03 DIAGNOSIS — M1711 Unilateral primary osteoarthritis, right knee: Secondary | ICD-10-CM | POA: Diagnosis not present

## 2020-05-05 DIAGNOSIS — S61213D Laceration without foreign body of left middle finger without damage to nail, subsequent encounter: Secondary | ICD-10-CM | POA: Diagnosis not present

## 2020-05-10 ENCOUNTER — Other Ambulatory Visit: Payer: Self-pay | Admitting: Internal Medicine

## 2020-05-10 ENCOUNTER — Telehealth: Payer: Self-pay | Admitting: Internal Medicine

## 2020-05-10 DIAGNOSIS — R69 Illness, unspecified: Secondary | ICD-10-CM | POA: Diagnosis not present

## 2020-05-10 MED ORDER — GE100 BLOOD GLUCOSE TEST VI STRP: ORAL_STRIP | 1 refills | Status: DC

## 2020-05-10 NOTE — Telephone Encounter (Signed)
Refill request for xanax, last seen 02-04-20, last filled 12-29-19.  Please advise.

## 2020-05-10 NOTE — Telephone Encounter (Signed)
err

## 2020-05-10 NOTE — Addendum Note (Signed)
Addended by: Elpidio Galea T on: 05/10/2020 12:35 PM   Modules accepted: Orders

## 2020-05-10 NOTE — Telephone Encounter (Signed)
Pt needs a refill on One Touch Test strips sent to [facility]

## 2020-05-10 NOTE — Addendum Note (Signed)
Addended by: Elpidio Galea T on: 05/10/2020 12:34 PM   Modules accepted: Orders

## 2020-05-13 ENCOUNTER — Other Ambulatory Visit: Payer: Self-pay | Admitting: Internal Medicine

## 2020-05-16 ENCOUNTER — Other Ambulatory Visit: Payer: Self-pay | Admitting: Internal Medicine

## 2020-05-17 ENCOUNTER — Other Ambulatory Visit: Payer: Self-pay | Admitting: Internal Medicine

## 2020-05-17 DIAGNOSIS — Z1231 Encounter for screening mammogram for malignant neoplasm of breast: Secondary | ICD-10-CM

## 2020-05-24 DIAGNOSIS — S61213D Laceration without foreign body of left middle finger without damage to nail, subsequent encounter: Secondary | ICD-10-CM | POA: Diagnosis not present

## 2020-06-06 DIAGNOSIS — R69 Illness, unspecified: Secondary | ICD-10-CM | POA: Diagnosis not present

## 2020-06-08 DIAGNOSIS — R69 Illness, unspecified: Secondary | ICD-10-CM | POA: Diagnosis not present

## 2020-07-09 DIAGNOSIS — R69 Illness, unspecified: Secondary | ICD-10-CM | POA: Diagnosis not present

## 2020-07-20 ENCOUNTER — Other Ambulatory Visit: Payer: Self-pay | Admitting: Internal Medicine

## 2020-08-09 DIAGNOSIS — R69 Illness, unspecified: Secondary | ICD-10-CM | POA: Diagnosis not present

## 2020-08-10 ENCOUNTER — Other Ambulatory Visit: Payer: Self-pay | Admitting: Internal Medicine

## 2020-08-10 DIAGNOSIS — R69 Illness, unspecified: Secondary | ICD-10-CM | POA: Diagnosis not present

## 2020-08-12 ENCOUNTER — Telehealth: Payer: Self-pay

## 2020-08-12 DIAGNOSIS — E785 Hyperlipidemia, unspecified: Secondary | ICD-10-CM

## 2020-08-12 DIAGNOSIS — E1169 Type 2 diabetes mellitus with other specified complication: Secondary | ICD-10-CM

## 2020-08-12 DIAGNOSIS — E034 Atrophy of thyroid (acquired): Secondary | ICD-10-CM

## 2020-08-12 DIAGNOSIS — E118 Type 2 diabetes mellitus with unspecified complications: Secondary | ICD-10-CM

## 2020-08-12 NOTE — Telephone Encounter (Signed)
Pt would like lab appt prior to Feb appt with Dr Derrel Nip. I only saw one lab in there for creatine but was not sure if she needed more.

## 2020-08-16 NOTE — Telephone Encounter (Signed)
Fasting labs and urine ordered.

## 2020-08-16 NOTE — Telephone Encounter (Signed)
I did not see any labs that have been ordered for patient and she would like to have labs done before her appt in feb. Not sure what all you would like patient to have done

## 2020-08-16 NOTE — Telephone Encounter (Signed)
Patient scheduled labs for 10/17/20 at 9:15 am.

## 2020-08-24 ENCOUNTER — Ambulatory Visit
Admission: RE | Admit: 2020-08-24 | Discharge: 2020-08-24 | Disposition: A | Discharge status: Home / Self Care | Payer: Medicare HMO | Source: Ambulatory Visit | Attending: Internal Medicine | Admitting: Internal Medicine

## 2020-08-24 ENCOUNTER — Other Ambulatory Visit: Payer: Self-pay

## 2020-08-24 DIAGNOSIS — Z1231 Encounter for screening mammogram for malignant neoplasm of breast: Secondary | ICD-10-CM | POA: Diagnosis present

## 2020-09-01 ENCOUNTER — Telehealth: Payer: Self-pay

## 2020-09-01 NOTE — Telephone Encounter (Signed)
Patient to follow up with Dr.Byrnett for Colonoscopy.

## 2020-09-05 ENCOUNTER — Ambulatory Visit: Payer: Medicare HMO

## 2020-10-09 ENCOUNTER — Other Ambulatory Visit: Payer: Self-pay | Admitting: Internal Medicine

## 2020-10-12 ENCOUNTER — Other Ambulatory Visit: Payer: Self-pay | Admitting: Internal Medicine

## 2020-10-17 ENCOUNTER — Other Ambulatory Visit: Payer: Self-pay

## 2020-10-17 ENCOUNTER — Other Ambulatory Visit (INDEPENDENT_AMBULATORY_CARE_PROVIDER_SITE_OTHER): Payer: Medicare HMO

## 2020-10-17 DIAGNOSIS — E118 Type 2 diabetes mellitus with unspecified complications: Secondary | ICD-10-CM

## 2020-10-17 DIAGNOSIS — E034 Atrophy of thyroid (acquired): Secondary | ICD-10-CM

## 2020-10-17 DIAGNOSIS — E1169 Type 2 diabetes mellitus with other specified complication: Secondary | ICD-10-CM | POA: Diagnosis not present

## 2020-10-17 DIAGNOSIS — E785 Hyperlipidemia, unspecified: Secondary | ICD-10-CM

## 2020-10-17 LAB — COMPREHENSIVE METABOLIC PANEL WITH GFR
ALT: 12 U/L (ref 0–35)
AST: 13 U/L (ref 0–37)
Albumin: 4.4 g/dL (ref 3.5–5.2)
Alkaline Phosphatase: 97 U/L (ref 39–117)
BUN: 27 mg/dL — ABNORMAL HIGH (ref 6–23)
CO2: 28 meq/L (ref 19–32)
Calcium: 10 mg/dL (ref 8.4–10.5)
Chloride: 106 meq/L (ref 96–112)
Creatinine, Ser: 0.76 mg/dL (ref 0.40–1.20)
GFR: 74.72 mL/min
Glucose, Bld: 133 mg/dL — ABNORMAL HIGH (ref 70–99)
Potassium: 4.5 meq/L (ref 3.5–5.1)
Sodium: 141 meq/L (ref 135–145)
Total Bilirubin: 0.6 mg/dL (ref 0.2–1.2)
Total Protein: 6.8 g/dL (ref 6.0–8.3)

## 2020-10-17 LAB — LIPID PANEL
Cholesterol: 141 mg/dL (ref 0–200)
HDL: 63.1 mg/dL
LDL Cholesterol: 56 mg/dL (ref 0–99)
NonHDL: 78.19
Total CHOL/HDL Ratio: 2
Triglycerides: 110 mg/dL (ref 0.0–149.0)
VLDL: 22 mg/dL (ref 0.0–40.0)

## 2020-10-17 LAB — TSH: TSH: 1.78 u[IU]/mL (ref 0.35–4.50)

## 2020-10-17 LAB — MICROALBUMIN / CREATININE URINE RATIO
Creatinine,U: 103.6 mg/dL
Microalb Creat Ratio: 1.3 mg/g (ref 0.0–30.0)
Microalb, Ur: 1.3 mg/dL (ref 0.0–1.9)

## 2020-10-17 LAB — HEMOGLOBIN A1C: Hgb A1c MFr Bld: 5.9 % (ref 4.6–6.5)

## 2020-10-19 ENCOUNTER — Other Ambulatory Visit: Payer: Self-pay

## 2020-10-19 ENCOUNTER — Telehealth: Payer: Self-pay | Admitting: Internal Medicine

## 2020-10-19 ENCOUNTER — Ambulatory Visit (INDEPENDENT_AMBULATORY_CARE_PROVIDER_SITE_OTHER): Payer: Medicare HMO | Admitting: Internal Medicine

## 2020-10-19 ENCOUNTER — Encounter: Payer: Self-pay | Admitting: Internal Medicine

## 2020-10-19 VITALS — BP 128/62 | HR 60 | Temp 98.2°F | Resp 15 | Ht 63.0 in | Wt 160.8 lb

## 2020-10-19 DIAGNOSIS — Z87442 Personal history of urinary calculi: Secondary | ICD-10-CM

## 2020-10-19 DIAGNOSIS — I700 Atherosclerosis of aorta: Secondary | ICD-10-CM

## 2020-10-19 DIAGNOSIS — M810 Age-related osteoporosis without current pathological fracture: Secondary | ICD-10-CM | POA: Diagnosis not present

## 2020-10-19 DIAGNOSIS — D696 Thrombocytopenia, unspecified: Secondary | ICD-10-CM

## 2020-10-19 DIAGNOSIS — E118 Type 2 diabetes mellitus with unspecified complications: Secondary | ICD-10-CM

## 2020-10-19 DIAGNOSIS — I10 Essential (primary) hypertension: Secondary | ICD-10-CM

## 2020-10-19 DIAGNOSIS — R319 Hematuria, unspecified: Secondary | ICD-10-CM

## 2020-10-19 DIAGNOSIS — E1169 Type 2 diabetes mellitus with other specified complication: Secondary | ICD-10-CM | POA: Diagnosis not present

## 2020-10-19 DIAGNOSIS — E785 Hyperlipidemia, unspecified: Secondary | ICD-10-CM

## 2020-10-19 MED ORDER — VITAMIN D3 25 MCG (1000 UNIT) PO TABS: 1000.0000 [IU] | ORAL_TABLET | Freq: Every day | ORAL | 1 refills | Status: DC

## 2020-10-19 MED ORDER — GLIPIZIDE 5 MG PO TABS: 5.0000 mg | ORAL_TABLET | Freq: Two times a day (BID) | ORAL | 1 refills | Status: DC

## 2020-10-19 NOTE — Assessment & Plan Note (Signed)
Bone Density scores in 2019.

## 2020-10-19 NOTE — Assessment & Plan Note (Signed)
Reviewed findings of prior CT scan today..  Patient is tolerating high potency statin therapy  

## 2020-10-19 NOTE — Assessment & Plan Note (Signed)
Mild, noted last year but not worked up due to competing medical issues.  Will repeat CBC  With next draw

## 2020-10-19 NOTE — Telephone Encounter (Signed)
Pt called she is taking Vit D3 1000IU 25mcg

## 2020-10-19 NOTE — Assessment & Plan Note (Signed)
Tolerating a  high potency statin.  liver enzymes are normal,  And LDL is < 70.  No changes today   Lab Results  Component Value Date   CHOL 141 10/17/2020   HDL 63.10 10/17/2020   LDLCALC 56 10/17/2020   LDLDIRECT 87.0 02/19/2018   TRIG 110.0 10/17/2020   CHOLHDL 2 10/17/2020   Lab Results  Component Value Date   ALT 12 10/17/2020   AST 13 10/17/2020   ALKPHOS 97 10/17/2020   BILITOT 0.6 10/17/2020

## 2020-10-19 NOTE — Assessment & Plan Note (Signed)
Well controlled on current regimen. Renal function stable, no changes today.  Lab Results  Component Value Date   CREATININE 0.76 10/17/2020   Lab Results  Component Value Date   NA 141 10/17/2020   K 4.5 10/17/2020   CL 106 10/17/2020   CO2 28 10/17/2020

## 2020-10-19 NOTE — Patient Instructions (Addendum)
Reduce the glipizide TO 5 MG TWICE DAILY. If you continue to have sugars < 80,  Quit the morning dose completely  If you have another episode of blood in urine or pain, bring a urine specimen in using the container I gave you today,  But  CALL THE OFFICE to let us know so we can set up for a quick video visit in 2 days    Citric acid in your water daily will prevent most kidney stones (lemon, lime or orange  Juice)   Let me know how much Vitamin D you are taking  ( it comes in mcg  Or    IU's) You need about 1800 mg daily of calcium  or osteoporosis  Through diet and supplements   I recommend getting  half of your calcium and Vitamin D  Requirements through dietary sources rather than supplements given the recent association of calcium supplements with increased coronary artery calcium scores.  There are dairy and non dairy sources in diet (dairy is higher in calcium than the other sources which are primarily  Vegetables) for lactose intolerant patients,  unsweetened almond/coconut milk, Cashew and soy  Milks are all great low calorie low carb, cholesterol free sources of dietary calcttcaium and vitamin D.   Return to discuss treatment for osteoporosis after you have your bone density test repeated

## 2020-10-19 NOTE — Assessment & Plan Note (Signed)
S/p cystocopy/urteroscopy with stent , right ureter  April 2021.  Discussed ways to prevent recurrence with increased water intake and citric acid.

## 2020-10-19 NOTE — Progress Notes (Signed)
Subjective:  Patient ID: [name], female    DOB: 1940/12/03  Age: 80 y.o. MRN: [id number]  CC: The primary encounter diagnosis was Hematuria, unspecified type. Diagnoses of Osteoporosis, post-menopausal, Thrombocytopenia ([place]), Aortic arch atherosclerosis ([place]), DM type 2, controlled, with complication ([place]), Essential hypertension, Hyperlipidemia associated with type 2 diabetes mellitus ([place]), and History of nephrolithiasis were also pertinent to this visit.  HPI [name] presents for follow up on type 2 DM , hypertension and hyperlipidemia  This visit occurred during the SARS-CoV-2 public health emergency.  Safety protocols were in place, including screening questions prior to the visit, additional usage of staff PPE, and extensive cleaning of exam room while observing appropriate contact time as indicated for disinfecting solutions.   1) Presented to clinic with hematuria in February,   Obstructing 7 mm right ureteral kidney stone diagnosed In March. Underwent cystoscopy and ureteroscopy  With ureteral stent by  Dr Erlene Quan in April. Recovery was complicated by RLQ pain and urinary retention .  Reviewed findings of prior CT scan today which noted aortic atherosclerosis and the prognostic implications  2) INFECTED MIDDLE FINGER WITH ABSCESS  IN AUGUST  TREATED BY [facility] WITH INCISION AND DRAINAGE.  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  Some stiffness In the morning. Deferred PT of hand   3) embedded tick bite popliteal fossa in June had to be removed by  General Surgery PA   4)  Diabetes   She  feels generally well,  But is not  exercising regularly due to right knee DJD pain  And has gained weight. Checking  blood sugars twice daily  And having recurrent symptomatic  lows at 11:30 am .  Takes glipizide 5 mg  at 6 am with breakfast.  BS have been  < 150 post prandially. . Following a carbohydrate modified diet 6 days per week. Denies numbness, burning and tingling of  extremities. Appetite is good.   5) Back pain managed with tylenol.   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 Not walking due to pain .  Using biofreeze  , "horse linement" , [place]  All help transiently   7) thrombocytopenia: noted during August visit ,  She did not return for repeat assessment due to multiple other medical issues at the time,  Has not noticed any bleeding.  Will repeat at nect draw in 3 months      Lab Results  Component Value Date   HGBA1C 5.9 10/17/2020      Outpatient Medications Prior to Visit  Medication Sig Dispense Refill  . ALPRAZolam (XANAX) 0.25 MG tablet Take 0.5 tablets (0.125 mg total) by mouth at bedtime as needed for anxiety. 15 tablet 5  . atorvastatin (LIPITOR) 20 MG tablet TAKE 1 TABLET BY MOUTH EVERY DAY 90 tablet 1  . calcium-vitamin D (OSCAL WITH D) 500-200 MG-UNIT per tablet Take 1 tablet by mouth daily.     Marland Kitchen levothyroxine (SYNTHROID) 88 MCG tablet TAKE 1 TABLET BY MOUTH EVERY DAY ON EMPTY STOMACH WITH WATER AT LEAST 30-60 MINS BEFORE BREAKFAST 90 tablet 1  . losartan (COZAAR) 50 MG tablet TAKE 1 TABLET BY MOUTH EVERY DAY (Patient taking differently: Take 50 mg by mouth daily.) 90 tablet 3  . metFORMIN (GLUCOPHAGE-XR) 500 MG 24 hr tablet TAKE 1 TABLET BY MOUTH EVERY DAY WITH BREAKFAST 90 tablet 3  . ONETOUCH ULTRA test strip USE AS DIRECTED 100 strip 10  .  pioglitazone (ACTOS) 15 MG tablet TAKE 1 TABLET (15 MG TOTAL) BY MOUTH DAILY WITH SUPPER. 90 tablet 0  . glipiZIDE (GLUCOTROL) 10 MG tablet TAKE 1 TABLET BY MOUTH TWICE A DAY BEFORE A MEAL 180 tablet 1  . meloxicam (MOBIC) 7.5 MG tablet TAKE 1 TABLET (7.5 MG TOTAL) BY MOUTH DAILY. AS NEEDED FOR KNEE PAIN (Patient not taking: Reported on 10/19/2020) 90 tablet 1  . aspirin 81 MG tablet Take 81 mg by mouth daily. (Patient not taking: Reported on 10/19/2020)     No facility-administered medications prior to visit.    Review of Systems;  Patient denies headache,  fevers, malaise, unintentional weight loss, skin rash, eye pain, sinus congestion and sinus pain, sore throat, dysphagia,  hemoptysis , cough, dyspnea, wheezing, chest pain, palpitations, orthopnea, edema, abdominal pain, nausea, melena, diarrhea, constipation, flank pain, dysuria, hematuria, urinary  Frequency, nocturia, numbness, tingling, seizures,  Focal weakness, Loss of consciousness,  Tremor, insomnia, depression, anxiety, and suicidal ideation.      Objective:  BP 128/62 (BP Location: Left Arm, Patient Position: Sitting, Cuff Size: Normal)   Pulse 60   Temp 98.2 F (36.8 C) (Oral)   Resp 15   Ht 5\' 3"  (1.6 m)   Wt 160 lb 12.8 oz (72.9 kg)   SpO2 99%   BMI 28.48 kg/m   BP Readings from Last 3 Encounters:  10/19/20 128/62  04/13/20 (!) 142/78  02/04/20 136/60    Wt Readings from Last 3 Encounters:  10/19/20 160 lb 12.8 oz (72.9 kg)  04/13/20 154 lb 12.8 oz (70.2 kg)  02/04/20 157 lb 9.6 oz (71.5 kg)    General appearance: alert, cooperative and appears stated age Ears: normal TM's and external ear canals both ears Throat: lips, mucosa, and tongue normal; teeth and gums normal Neck: no adenopathy, no carotid bruit, supple, symmetrical, trachea midline and thyroid not enlarged, symmetric, no tenderness/mass/nodules Back: symmetric, no curvature. ROM normal. No CVA tenderness. Lungs: clear to auscultation bilaterally Heart: regular rate and rhythm, S1, S2 normal, no murmur, click, rub or gallop Abdomen: soft, non-tender; bowel sounds normal; no masses,  no organomegaly Pulses: 2+ and symmetric Skin: Skin color, texture, turgor normal. No rashes or lesions Lymph nodes: Cervical, supraclavicular, and axillary nodes normal.  Lab Results  Component Value Date   HGBA1C 5.9 10/17/2020   HGBA1C 5.9 04/11/2020   HGBA1C 6.0 08/24/2019    Lab Results  Component Value Date   CREATININE 0.76 10/17/2020   CREATININE 0.69 04/11/2020   CREATININE 0.70 01/25/2020    Lab  Results  Component Value Date   WBC 4.8 04/11/2020   HGB 12.5 04/11/2020   HCT 36.0 04/11/2020   PLT 131.0 (L) 04/11/2020   GLUCOSE 133 (H) 10/17/2020   CHOL 141 10/17/2020   TRIG 110.0 10/17/2020   HDL 63.10 10/17/2020   LDLDIRECT 87.0 02/19/2018   LDLCALC 56 10/17/2020   ALT 12 10/17/2020   AST 13 10/17/2020   NA 141 10/17/2020   K 4.5 10/17/2020   CL 106 10/17/2020   CREATININE 0.76 10/17/2020   BUN 27 (H) 10/17/2020   CO2 28 10/17/2020   TSH 1.78 10/17/2020   HGBA1C 5.9 10/17/2020   MICROALBUR 1.3 10/17/2020    MM 3D SCREEN BREAST BILATERAL  Result Date: 08/29/2020 CLINICAL DATA:  Screening. EXAM: DIGITAL SCREENING BILATERAL MAMMOGRAM WITH TOMO AND CAD COMPARISON:  Previous exam(s). ACR Breast Density Category b: There are scattered areas of fibroglandular density. FINDINGS: There are no findings suspicious for malignancy.  Images were processed with CAD. IMPRESSION: No mammographic evidence of malignancy. A result letter of this screening mammogram will be mailed directly to the patient. RECOMMENDATION: Screening mammogram in one year. (Code:SM-B-01Y) BI-RADS CATEGORY  1: Negative. Electronically Signed   By: Lajean Manes M.D.   On: 08/29/2020 09:18    Assessment & Plan:   Problem List Items Addressed This Visit      Unprioritized   Aortic arch atherosclerosis ([place])    Reviewed findings of prior CT scan today..  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  Reduce morning dose of glipizde to 5 mg .  Continue actos and metformin. Continue asa, statin and ARB  Lab Results  Component Value Date   HGBA1C 5.9 10/17/2020   Lab Results  Component Value Date   LABMICR See below: 12/14/2019   LABMICR See below: 12/11/2019   MICROALBUR 1.3 10/17/2020   MICROALBUR 7.7 (H) 08/24/2019           Relevant  Medications   glipiZIDE (GLUCOTROL) 5 MG tablet   Other Relevant Orders   Comprehensive metabolic panel   Hemoglobin A1c   Microalbumin / creatinine urine ratio   Essential hypertension    Well controlled on current regimen. Renal function stable, no changes today.  Lab Results  Component Value Date   CREATININE 0.76 10/17/2020   Lab Results  Component Value Date   NA 141 10/17/2020   K 4.5 10/17/2020   CL 106 10/17/2020   CO2 28 10/17/2020         History of nephrolithiasis    S/p cystocopy/urteroscopy with stent , right ureter  April 2021.  Discussed ways to prevent recurrence with increased water intake and citric acid.       Hyperlipidemia associated with type 2 diabetes mellitus ([place])    Tolerating a  high potency statin.  liver enzymes are normal,  And LDL is < 70.  No changes today   Lab Results  Component Value Date   CHOL 141 10/17/2020   HDL 63.10 10/17/2020   LDLCALC 56 10/17/2020   LDLDIRECT 87.0 02/19/2018   TRIG 110.0 10/17/2020   CHOLHDL 2 10/17/2020   Lab Results  Component Value Date   ALT 12 10/17/2020   AST 13 10/17/2020   ALKPHOS 97 10/17/2020   BILITOT 0.6 10/17/2020         Relevant Medications   glipiZIDE (GLUCOTROL) 5 MG tablet   Other Relevant Orders   Lipid panel   Osteoporosis, post-menopausal    Bone Density scores in 2019.        Relevant Medications   cholecalciferol (VITAMIN D) 25 MCG (1000 UNIT) tablet   Other Relevant Orders   DG Bone Density   Thrombocytopenia (H[facility])    Mild, noted last year but not worked up due to competing medical issues.  Will repeat CBC  With next draw       Relevant Orders   CBC with Differential/Platelet    Other Visit Diagnoses    Hematuria, unspecified type    -  Primary   Relevant Orders   Urinalysis, Routine w reflex microscopic   Urine Culture      I have discontinued Ivin Booty B. Gohlke's aspirin. I have also changed her glipiZIDE. Additionally, I am having her start on  cholecalciferol. Lastly, I am having her maintain her calcium-vitamin D, losartan, meloxicam, ALPRAZolam, levothyroxine, metFORMIN, OneTouch Ultra, atorvastatin, and pioglitazone.  Meds ordered this encounter  Medications  . glipiZIDE (GLUCOTROL) 5 MG tablet    Sig: Take 1 tablet (5 mg total) by mouth 2 (two) times daily before a meal.    Dispense:  180 tablet    Refill:  1    DO NOT FILL YET.  KEEP ON FILE FOR FUTURE REFILLS  NOTE LOWER DOSE CHANGE  . cholecalciferol (VITAMIN D) 25 MCG (1000 UNIT) tablet    Sig: Take 1 tablet (1,000 Units total) by mouth daily.    Dispense:  90 tablet    Refill:  1  A total of 40 minutes was spent with patient more than half of which was spent in counseling patient on the above mentioned issues , reviewing and explaining recent labs and imaging studies done, and coordination of care.  Medications Discontinued During This Encounter  Medication Reason  . aspirin 81 MG tablet   . glipiZIDE (GLUCOTROL) 10 MG tablet     Follow-up: Return in about 1 month (around 11/16/2020).   Crecencio Mc, MD

## 2020-10-19 NOTE — Assessment & Plan Note (Signed)
A1c remains very low  At < 6.0  And she continues to have hypoglycemic episodes in late morning.  Reduce morning dose of glipizde to 5 mg .  Continue actos and metformin. Continue asa, statin and ARB  Lab Results  Component Value Date   HGBA1C 5.9 10/17/2020   Lab Results  Component Value Date   LABMICR See below: 12/14/2019   LABMICR See below: 12/11/2019   MICROALBUR 1.3 10/17/2020   MICROALBUR 7.7 (H) 08/24/2019

## 2020-11-02 ENCOUNTER — Other Ambulatory Visit: Payer: Self-pay

## 2020-11-02 ENCOUNTER — Ambulatory Visit
Admission: RE | Admit: 2020-11-02 | Discharge: 2020-11-02 | Disposition: A | Discharge status: Home / Self Care | Payer: Medicare HMO | Source: Ambulatory Visit | Attending: Internal Medicine | Admitting: Internal Medicine

## 2020-11-02 DIAGNOSIS — M85831 Other specified disorders of bone density and structure, right forearm: Secondary | ICD-10-CM | POA: Diagnosis not present

## 2020-11-02 DIAGNOSIS — M810 Age-related osteoporosis without current pathological fracture: Secondary | ICD-10-CM | POA: Diagnosis present

## 2020-11-10 ENCOUNTER — Other Ambulatory Visit: Payer: Self-pay | Admitting: Internal Medicine

## 2020-11-12 ENCOUNTER — Other Ambulatory Visit: Payer: Self-pay | Admitting: Internal Medicine

## 2020-11-12 DIAGNOSIS — E559 Vitamin D deficiency, unspecified: Secondary | ICD-10-CM

## 2020-11-12 MED ORDER — VITAMIN D3 25 MCG (1000 UNIT) PO TABS: 2000.0000 [IU] | ORAL_TABLET | Freq: Every day | ORAL | 1 refills | Status: DC

## 2020-11-15 DIAGNOSIS — M1711 Unilateral primary osteoarthritis, right knee: Secondary | ICD-10-CM | POA: Diagnosis not present

## 2020-11-18 ENCOUNTER — Ambulatory Visit (INDEPENDENT_AMBULATORY_CARE_PROVIDER_SITE_OTHER): Payer: Medicare HMO | Admitting: Internal Medicine

## 2020-11-18 ENCOUNTER — Telehealth: Payer: Self-pay | Admitting: Internal Medicine

## 2020-11-18 ENCOUNTER — Encounter: Payer: Self-pay | Admitting: Internal Medicine

## 2020-11-18 ENCOUNTER — Other Ambulatory Visit: Payer: Self-pay

## 2020-11-18 VITALS — BP 126/62 | HR 57 | Temp 98.5°F | Resp 15 | Ht 63.0 in | Wt 159.6 lb

## 2020-11-18 DIAGNOSIS — M810 Age-related osteoporosis without current pathological fracture: Secondary | ICD-10-CM

## 2020-11-18 DIAGNOSIS — E559 Vitamin D deficiency, unspecified: Secondary | ICD-10-CM

## 2020-11-18 LAB — VITAMIN D 25 HYDROXY (VIT D DEFICIENCY, FRACTURES): VITD: 39.53 ng/mL (ref 30.00–100.00)

## 2020-11-18 NOTE — Patient Instructions (Signed)
Take you one calcium supplement daily  (600 mg ) and drink 2 glasses of almond milk daily  We are checking your Vitamin D level today to see how much you need   Juliann Pulse our RN will call you when the Prolia medication has been authorized.     Eating Plan for Osteoporosis Osteoporosis causes your bones to become weak and brittle. This puts you at greater risk for bone breaks (fractures) from small bumps or falls. Making changes to your diet and increasing your physical activity can help strengthen your bones and improve your overall health. Calcium and vitamin D are nutrients that play an important role in bone health. Vitamin D helps your body use calcium and strengthen bones. It is important to get enough calcium and vitamin D as part of your eating plan for osteoporosis. What are tips for following this plan? Reading food labels  Try to get at least 1,000 milligrams (mg) of calcium each day.  Look for foods that have at least 50 mg of calcium per serving.  Talk with your health care provider about taking a calcium supplement if you do not get enough calcium from food.  Do not have more than 2,500 mg of calcium each day. This is the upper limit for food and nutritional supplements combined. Too much calcium may cause constipation and prevent you from absorbing other important nutrients.  Choose foods that contain vitamin D.  Take a daily vitamin supplement that contains 800-1,000 international units (IU) of vitamin D. The amount may be different depending on your age, body weight, and where you live. Talk with your dietitian or health care provider about how much vitamin D is right for you.  Avoid foods that have more than 300 mg of sodium per serving. Too much sodium can cause your body to lose calcium.  Talk with your dietitian or health care provider about how much sodium you are allowed each day. Shopping  Do not buy foods with added salt, including: ? Salted  snacks. ? Angie Fava. ? Canned soups. ? Canned meats. ? Processed meats, such as bacon or precooked or cured meat like sausages or meat loaves. ? Smoked fish. Meal planning  Eat balanced meals that contain protein foods, fruits and vegetables, and foods rich in calcium and vitamin D.  Eat at least 5 servings of fruits and vegetables each day.  Eat 5-6 oz (142-170 g) of lean meat, poultry, fish, eggs, or beans each day. Lifestyle  Do not use any products that contain nicotine or tobacco, such as cigarettes, e-cigarettes, and chewing tobacco. If you need help quitting, ask your health care provider.  If your health care provider recommends that you lose weight: ? Work with a dietitian to develop an eating plan that will help you reach your desired weight goal. ? Exercise for at least 30 minutes a day, 5 or more days a week, or as told by your health care provider.  Work with a physical therapist to develop an exercise plan that includes flexibility, balance, and strength exercises. Do not focus only on aerobic exercise.  Do not drink alcohol if: ? Your health care provider tells you not to drink. ? You are pregnant, may be pregnant, or are planning to become pregnant.  If you drink alcohol: ? Limit how much you use to:  0-1 drink a day for women.  0-2 drinks a day for men. ? Be aware of how much alcohol is in your drink. In the [place] one drink  equals one 12 oz bottle of beer (355 mL), one 5 oz glass of wine (148 mL), or one 1 oz glass of hard liquor (44 mL). What foods should I eat? Foods high in calcium  Yogurt. Yogurt with fruit.  Milk. Evaporated skim milk. Dry milk powder.  Calcium-fortified orange juice.  Parmesan cheese. Part-skim ricotta cheese. Natural hard cheese. Cream cheese. Cottage cheese.  Canned sardines. Canned salmon.  Calcium-treated tofu. Calcium-fortified cereal bar. Calcium-fortified cereal. Calcium-fortified graham crackers.  Cooked collard greens.  Turnip greens. Broccoli. Kale.  Almonds.  White beans.  Corn tortilla.   Foods high in vitamin D  Cod liver oil. Fatty fish, such as tuna, mackerel, and salmon.  Milk. Fortified soy milk. Fortified fruit juice.  Yogurt. Margarine.  Egg yolks. Foods high in protein  Beef. Lamb. Pork tenderloin.  Chicken breast.  Tuna (canned). Fish fillet.  Tofu.  Cooked soy beans. Soy patty. Beans (canned or cooked).  Cottage cheese.  Yogurt.  Peanut butter.  Pumpkin seeds. Nuts. Sunflower seeds.  Hard cheese.  Milk or other milk products, such as soy milk. The items listed above may not be a complete list of foods and beverages you can eat. Contact a dietitian for more options. Summary  Calcium and vitamin D are nutrients that play an important role in bone health and are an important part of your eating plan for osteoporosis.  Eat balanced meals that contain protein foods, fruits and vegetables, and foods rich in calcium and vitamin D.  Avoid foods that have more than 300 mg of sodium per serving. Too much sodium can cause your body to lose calcium.  Exercise is an important part of prevention and treatment of osteoporosis. Aim for at least 30 minutes a day, 5 days a week. This information is not intended to replace advice given to you by your health care provider. Make sure you discuss any questions you have with your health care provider. Document Revised: 02/04/2020 Document Reviewed: 02/04/2020 Elsevier Patient Education  [place].

## 2020-11-18 NOTE — Telephone Encounter (Signed)
Start Process for Prolia she will need a Vit d level once approved.

## 2020-11-18 NOTE — Progress Notes (Signed)
Subjective:  Patient ID: [name], female    DOB: 03-16-1941  Age: 80 y.o. MRN: [id number]  CC: The primary encounter diagnosis was Vitamin D deficiency. A diagnosis of Osteoporosis, post-menopausal was also pertinent to this visit.  HPI [name] presents for discussion of osteoporosis treatment  This visit occurred during the SARS-CoV-2 public health emergency.  Safety protocols were in place, including screening questions prior to the visit, additional usage of staff PPE, and extensive cleaning of exam room while observing appropriate contact time as indicated for disinfecting solutions.   DEXA reviewed with patient T score has progressed since 2018 without treatment . No history of fractures..  Lifestyle sedentary due to knee and back pain.   treatment options reviewed : patient requesting Prolia  Seeing ortho for C/s injections in right knee and seeing [facility] Cassell Smiles  Taking tylenol 1000 mg bid for low back pain and seeing Marland Kitchen     Outpatient Medications Prior to Visit  Medication Sig Dispense Refill  . ALPRAZolam (XANAX) 0.25 MG tablet Take 0.5 tablets (0.125 mg total) by mouth at bedtime as needed for anxiety. 15 tablet 5  . atorvastatin (LIPITOR) 20 MG tablet TAKE 1 TABLET BY MOUTH EVERY DAY 90 tablet 1  . cholecalciferol (VITAMIN D) 25 MCG (1000 UNIT) tablet Take 2 tablets (2,000 Units total) by mouth daily. 90 tablet 1  . glipiZIDE (GLUCOTROL) 5 MG tablet Take 1 tablet (5 mg total) by mouth 2 (two) times daily before a meal. 180 tablet 1  . levothyroxine (SYNTHROID) 88 MCG tablet TAKE 1 TABLET BY MOUTH EVERY DAY ON EMPTY STOMACH WITH WATER AT LEAST 30-60 MINS BEFORE BREAKFAST 90 tablet 3  . losartan (COZAAR) 50 MG tablet TAKE 1 TABLET BY MOUTH EVERY DAY (Patient taking differently: Take 50 mg by mouth daily.) 90 tablet 3  . meloxicam (MOBIC) 7.5 MG tablet TAKE 1 TABLET (7.5 MG TOTAL) BY MOUTH DAILY. AS NEEDED FOR KNEE PAIN 90 tablet 1  . metFORMIN  (GLUCOPHAGE-XR) 500 MG 24 hr tablet TAKE 1 TABLET BY MOUTH EVERY DAY WITH BREAKFAST 90 tablet 3  . ONETOUCH ULTRA test strip USE AS DIRECTED 100 strip 10  . pioglitazone (ACTOS) 15 MG tablet TAKE 1 TABLET (15 MG TOTAL) BY MOUTH DAILY WITH SUPPER. 90 tablet 0  . calcium-vitamin D (OSCAL WITH D) 500-200 MG-UNIT per tablet Take 1 tablet by mouth daily.  (Patient not taking: Reported on 11/18/2020)     No facility-administered medications prior to visit.    Review of Systems;  Patient denies headache, fevers, malaise, unintentional weight loss, skin rash, eye pain, sinus congestion and sinus pain, sore throat, dysphagia,  hemoptysis , cough, dyspnea, wheezing, chest pain, palpitations, orthopnea, edema, abdominal pain, nausea, melena, diarrhea, constipation, flank pain, dysuria, hematuria, urinary  Frequency, nocturia, numbness, tingling, seizures,  Focal weakness, Loss of consciousness,  Tremor, insomnia, depression, anxiety, and suicidal ideation.      Objective:  BP 126/62 (BP Location: Left Arm, Patient Position: Sitting, Cuff Size: Normal)   Pulse (!) 57   Temp 98.5 F (36.9 C) (Oral)   Resp 15   Ht 5\' 3"  (1.6 m)   Wt 159 lb 9.6 oz (72.4 kg)   SpO2 97%   BMI 28.27 kg/m   BP Readings from Last 3 Encounters:  11/18/20 126/62  10/19/20 128/62  04/13/20 (!) 142/78    Wt Readings from Last 3 Encounters:  11/18/20 159 lb 9.6 oz (72.4 kg)  10/19/20 160 lb 12.8  oz (72.9 kg)  04/13/20 154 lb 12.8 oz (70.2 kg)    General appearance: alert, cooperative and appears stated age Ears: normal TM's and external ear canals both ears Throat: lips, mucosa, and tongue normal; teeth and gums normal Neck: no adenopathy, no carotid bruit, supple, symmetrical, trachea midline and thyroid not enlarged, symmetric, no tenderness/mass/nodules Back: symmetric, no curvature. ROM normal. No CVA tenderness. Lungs: clear to auscultation bilaterally Heart: regular rate and rhythm, S1, S2 normal, no murmur,  click, rub or gallop Abdomen: soft, non-tender; bowel sounds normal; no masses,  no organomegaly Pulses: 2+ and symmetric Skin: Skin color, texture, turgor normal. No rashes or lesions Lymph nodes: Cervical, supraclavicular, and axillary nodes normal.  Lab Results  Component Value Date   HGBA1C 5.9 10/17/2020   HGBA1C 5.9 04/11/2020   HGBA1C 6.0 08/24/2019    Lab Results  Component Value Date   CREATININE 0.76 10/17/2020   CREATININE 0.69 04/11/2020   CREATININE 0.70 01/25/2020    Lab Results  Component Value Date   WBC 4.8 04/11/2020   HGB 12.5 04/11/2020   HCT 36.0 04/11/2020   PLT 131.0 (L) 04/11/2020   GLUCOSE 133 (H) 10/17/2020   CHOL 141 10/17/2020   TRIG 110.0 10/17/2020   HDL 63.10 10/17/2020   LDLDIRECT 87.0 02/19/2018   LDLCALC 56 10/17/2020   ALT 12 10/17/2020   AST 13 10/17/2020   NA 141 10/17/2020   K 4.5 10/17/2020   CL 106 10/17/2020   CREATININE 0.76 10/17/2020   BUN 27 (H) 10/17/2020   CO2 28 10/17/2020   TSH 1.78 10/17/2020   HGBA1C 5.9 10/17/2020   MICROALBUR 1.3 10/17/2020    DG Bone Density  Result Date: 11/02/2020 EXAM: DUAL X-RAY ABSORPTIOMETRY (DXA) FOR BONE MINERAL DENSITY IMPRESSION: Your patient [name] completed a BMD test on 11/02/2020 using the [place] (software version: 14.10) manufactured by [employer]. The following summarizes the results of our evaluation. Technologist: [facility] PATIENT BIOGRAPHICAL: Name: [name], [name] Patient ID: [id number] Birth Date: 14-Aug-1941 Height: 62.0 in. Gender: Female Exam Date: 11/02/2020 Weight: 160.8 lbs. Indications: Advanced Age, Caucasian, Diabetic, History of Fracture (Adult), Hypothyroid, Postmenopausal Fractures: Left wrist Treatments: Calcium, Glipizide, Levothyroxine, Metformin, Vitamin D DENSITOMETRY RESULTS: Site          Region     Measured Date Measured Age WHO Classification Young Adult T-score BMD         %Change vs. Previous Significant Change (*) DualFemur Neck  Left 11/02/2020 79.1 Osteoporosis -2.9 0.634 g/cm2 -7.2% - DualFemur Neck Left 05/23/2017 75.6 Osteoporosis -2.6 0.683 g/cm2 - - DualFemur Total Mean 11/02/2020 79.1 Osteopenia -2.4 0.710 g/cm2 -6.8% Yes DualFemur Total Mean 05/23/2017 75.6 Osteopenia -2.0 0.762 g/cm2 - - Right Forearm Radius 33% 11/02/2020 79.1 Osteopenia -2.4 0.666 g/cm2 - - ASSESSMENT: The BMD measured at Femur Neck Left is 0.634 g/cm2 with a T-score of -2.9. This patient is considered osteoporotic according to [facility]) criteria. The scan quality is good. Lumbar spine was not utilized due to advanced degenerative changes. Compared with prior study, there has been significant decrease in the total hip. World Pharmacologist [facility]) criteria for post-menopausal, Caucasian Women: Normal:                   T-score at or above -1 SD Osteopenia/low bone mass: T-score between -1 and -2.5 SD Osteoporosis:             T-score at or below -2.5 SD RECOMMENDATIONS: 1.  All patients should optimize calcium and vitamin D intake. 2. Consider FDA-approved medical therapies in postmenopausal women and men aged 60 years and older, based on the following: a. A hip or vertebral(clinical or morphometric) fracture b. T-score < -2.5 at the femoral neck or spine after appropriate evaluation to exclude secondary causes c. Low bone mass (T-score between -1.0 and -2.5 at the femoral neck or spine) and a 10-year probability of a hip fracture > 3% or a 10-year probability of a major osteoporosis-related fracture > 20% based on the [place]-adapted WHO algorithm 3. Clinician judgment and/or patient preferences may indicate treatment for people with 10-year fracture probabilities above or below these levels FOLLOW-UP: People with diagnosed cases of osteoporosis or at high risk for fracture should have regular bone mineral density tests. For patients eligible for Medicare, routine testing is allowed once every 2 years. The testing frequency can be increased to  one year for patients who have rapidly progressing disease, those who are receiving or discontinuing medical therapy to restore bone mass, or have additional risk factors. I have reviewed this report, and agree with the above findings. [facility] Electronically Signed   By: Rolm Baptise M.D.   On: 11/02/2020 11:15    Assessment & Plan:   Problem List Items Addressed This Visit      Unprioritized   Osteoporosis, post-menopausal    Discussed treatment options,  Calcium and Vit d requirements.  Prolia chosen as most appropriate  given her age and contraindication to using Evista due to sedentary nature        Other Visit Diagnoses    Vitamin D deficiency    -  Primary   Relevant Orders   VITAMIN D 25 Hydroxy (Vit-D Deficiency, Fractures)      I provided  30 minutes of  face-to-face time during this encounter reviewing patient's current problems and past surgeries, labs and imaging studies, providing counseling on the above mentioned problems , and coordination  of care . I have discontinued [name]'s calcium-vitamin D. I am also having her maintain her losartan, meloxicam, ALPRAZolam, metFORMIN, OneTouch Ultra, atorvastatin, pioglitazone, glipiZIDE, levothyroxine, and cholecalciferol.  No orders of the defined types were placed in this encounter.   Medications Discontinued During This Encounter  Medication Reason  . calcium-vitamin D (OSCAL WITH D) 500-200 MG-UNIT per tablet     Follow-up: Return in about 3 months (around 02/18/2021) for follow up diabetes.   Crecencio Mc, MD

## 2020-11-18 NOTE — Assessment & Plan Note (Addendum)
Discussed treatment options,  Calcium and Vit d requirements.  Prolia chosen as most appropriate  given her age and contraindication to using Evista due to sedentary nature

## 2020-11-29 ENCOUNTER — Other Ambulatory Visit: Payer: Self-pay | Admitting: Internal Medicine

## 2020-11-29 DIAGNOSIS — M4727 Other spondylosis with radiculopathy, lumbosacral region: Secondary | ICD-10-CM | POA: Diagnosis not present

## 2020-12-12 NOTE — Telephone Encounter (Signed)
Vitamin D level was attained and prolia approved patient has been scheduled for first injection. 12/13/20

## 2020-12-13 ENCOUNTER — Other Ambulatory Visit: Payer: Self-pay

## 2020-12-13 ENCOUNTER — Ambulatory Visit (INDEPENDENT_AMBULATORY_CARE_PROVIDER_SITE_OTHER): Payer: Medicare HMO

## 2020-12-13 DIAGNOSIS — M810 Age-related osteoporosis without current pathological fracture: Secondary | ICD-10-CM

## 2020-12-13 MED ORDER — DENOSUMAB 60 MG/ML ~~LOC~~ SOSY
60.0000 mg | PREFILLED_SYRINGE | Freq: Once | SUBCUTANEOUS | Status: AC
  Administered 2020-12-13: 60 mg via SUBCUTANEOUS

## 2020-12-13 NOTE — Progress Notes (Signed)
Patient presented for 6-month Prolia injection SQ to right arm. Patient tolerated well. 

## 2021-01-03 ENCOUNTER — Other Ambulatory Visit: Payer: Self-pay | Admitting: Internal Medicine

## 2021-01-05 ENCOUNTER — Other Ambulatory Visit: Payer: Self-pay | Admitting: Internal Medicine

## 2021-01-25 ENCOUNTER — Ambulatory Visit
Admission: RE | Admit: 2021-01-25 | Discharge: 2021-01-25 | Disposition: A | Discharge status: Home / Self Care | Payer: Medicare HMO | Attending: Urology | Admitting: Urology

## 2021-01-25 ENCOUNTER — Other Ambulatory Visit: Payer: Self-pay | Admitting: *Deleted

## 2021-01-25 ENCOUNTER — Ambulatory Visit
Admission: RE | Admit: 2021-01-25 | Discharge: 2021-01-25 | Disposition: A | Discharge status: Home / Self Care | Payer: Medicare HMO | Source: Ambulatory Visit | Attending: Urology | Admitting: Urology

## 2021-01-25 DIAGNOSIS — N200 Calculus of kidney: Secondary | ICD-10-CM | POA: Diagnosis present

## 2021-01-25 DIAGNOSIS — Z960 Presence of urogenital implants: Secondary | ICD-10-CM | POA: Diagnosis not present

## 2021-01-25 DIAGNOSIS — M47816 Spondylosis without myelopathy or radiculopathy, lumbar region: Secondary | ICD-10-CM | POA: Diagnosis not present

## 2021-01-26 ENCOUNTER — Ambulatory Visit: Payer: Medicare HMO | Admitting: Physician Assistant

## 2021-01-26 ENCOUNTER — Other Ambulatory Visit: Payer: Self-pay

## 2021-01-26 VITALS — BP 174/73 | HR 57 | Ht 63.0 in | Wt 157.0 lb

## 2021-01-26 DIAGNOSIS — Z87442 Personal history of urinary calculi: Secondary | ICD-10-CM

## 2021-01-26 DIAGNOSIS — R351 Nocturia: Secondary | ICD-10-CM

## 2021-01-26 LAB — BLADDER SCAN AMB NON-IMAGING

## 2021-01-26 NOTE — Progress Notes (Signed)
01/26/2021 5:10 PM   [name] 1941-06-10 [id number]  CC: Chief Complaint  Patient presents with  . Nephrolithiasis    HPI: [name] is a 80 y.o. female with PMH nephrolithiasis and simple right renal cyst who presents today for annual stone follow-up with KUB.  Today she reports no flank pain or gross hematuria over the past year.  She underwent KUB today which reveals no radiopaque ureteral stones.  Patient does report bothersome nocturia x2-4.  She denies daytime frequency or urinary leakage, though she does wear pads for security.  She admits poor fluid intake and does not tend to drink fluids after dinnertime.  She reports some BLE edema and denies a history of OSA, though she has never completed a sleep study.  She states that she is a snorer, however she denies a.m. fatigue or gasping breaths during sleep.  She is not taking diuretics.  She does have a daughter with OSA on CPAP.  PVR 109 mL.  PMH: Past Medical History:  Diagnosis Date  . Anxiety   . Diabetes mellitus   . GERD (gastroesophageal reflux disease)   . Hyperlipidemia   . Hypertension   . Osteoporosis   . Positive colorectal cancer screening using Cologuard test 2019  . Thyroid disease     Surgical History: Past Surgical History:  Procedure Laterality Date  . BREAST CYST ASPIRATION Bilateral 25+ yrs ago  . CATARACT EXTRACTION W/PHACO Right 03/02/2015   Procedure: CATARACT EXTRACTION PHACO AND INTRAOCULAR LENS PLACEMENT (Derma) TORIC LENS;  Surgeon: Leandrew Koyanagi, MD;  Location: [place] [facility];  Service: Ophthalmology;  Laterality: Right;  TORIC  . CATARACT EXTRACTION W/PHACO Left 03/30/2015   Procedure: CATARACT EXTRACTION PHACO AND INTRAOCULAR LENS PLACEMENT (IOC);  Surgeon: Leandrew Koyanagi, MD;  Location: [facility];  Service: Ophthalmology;  Laterality: Left;  TORIC  DIABETIC - oral meds  . CERVICAL POLYPECTOMY    . COLONOSCOPY  01/2007   Dr Olevia Perches  . COLONOSCOPY WITH  PROPOFOL N/A 10/23/2017   Procedure: COLONOSCOPY WITH PROPOFOL;  Surgeon: Robert Bellow, MD;  Location: [facility];  Service: Endoscopy;  Laterality: N/A;  . CYSTOSCOPY W/ RETROGRADES Bilateral 12/07/2019   Procedure: CYSTOSCOPY WITH RETROGRADE PYELOGRAM;  Surgeon: Hollice Espy, MD;  Location: [facility];  Service: Urology;  Laterality: Bilateral;  . CYSTOSCOPY/URETEROSCOPY/HOLMIUM LASER/STENT PLACEMENT Right 12/07/2019   Procedure: CYSTOSCOPY/URETEROSCOPY/HOLMIUM LASER/STENT PLACEMENT;  Surgeon: Hollice Espy, MD;  Location: [facility];  Service: Urology;  Laterality: Right;  . HUMERUS FRACTURE SURGERY    . VAGINAL DELIVERY    . WRIST FRACTURE SURGERY      Home Medications:  Allergies as of 01/26/2021      Reactions   Latex Rash   Nickel Rash      Medication List       Accurate as of Jan 26, 2021  5:10 PM. If you have any questions, ask your nurse or doctor.        ALPRAZolam 0.25 MG tablet Commonly known as: XANAX Take 0.5 tablets (0.125 mg total) by mouth at bedtime as needed for anxiety.   atorvastatin 20 MG tablet Commonly known as: LIPITOR TAKE 1 TABLET BY MOUTH EVERY DAY   cholecalciferol 25 MCG (1000 UNIT) tablet Commonly known as: VITAMIN D Take 2 tablets (2,000 Units total) by mouth daily.   glipiZIDE 5 MG tablet Commonly known as: GLUCOTROL Take 1 tablet (5 mg total) by mouth 2 (two) times daily before a meal.   glipiZIDE 10 MG tablet Commonly  known as: GLUCOTROL TAKE 1 TABLET BY MOUTH TWICE A DAY BEFORE A MEAL   levothyroxine 88 MCG tablet Commonly known as: SYNTHROID TAKE 1 TABLET BY MOUTH EVERY DAY ON EMPTY STOMACH WITH WATER AT LEAST 30-60 MINS BEFORE BREAKFAST   losartan 50 MG tablet Commonly known as: COZAAR TAKE 1 TABLET BY MOUTH EVERY DAY   meloxicam 7.5 MG tablet Commonly known as: MOBIC TAKE 1 TABLET (7.5 MG TOTAL) BY MOUTH DAILY. AS NEEDED FOR KNEE PAIN   metFORMIN 500 MG 24 hr tablet Commonly known as: GLUCOPHAGE-XR TAKE 1 TABLET  BY MOUTH EVERY DAY WITH BREAKFAST   OneTouch Ultra test strip Generic drug: glucose blood USE AS DIRECTED   pioglitazone 15 MG tablet Commonly known as: ACTOS TAKE 1 TABLET (15 MG TOTAL) BY MOUTH DAILY WITH SUPPER.       Allergies:  Allergies  Allergen Reactions  . Latex Rash  . Nickel Rash    Family History: Family History  Problem Relation Age of Onset  . Hypertension Mother   . Stroke Mother 46       hemorrhagic  . Heart disease Father   . Cancer Father        Lung CA,  died of AMI while in [place] getting Laetr[place]  . Diabetes Brother   . Hypertension Brother   . Hypertension Son   . Heart disease Maternal Grandfather   . Cancer Maternal Grandfather   . Breast cancer Maternal Grandmother 43  . Depression Sister     Social History:   reports that she has never smoked. She has never used smokeless tobacco. She reports that she does not drink alcohol and does not use drugs.  Physical Exam: BP (!) 174/73   Pulse (!) 57   Ht 5\' 3"  (1.6 m)   Wt 157 lb (71.2 kg)   BMI 27.81 kg/m   Constitutional:  Alert and oriented, no acute distress, nontoxic appearing HEENT: [place], AT Cardiovascular: No clubbing, cyanosis, or edema Respiratory: Normal respiratory effort, no increased work of breathing Skin: No rashes, bruises or suspicious lesions Neurologic: Grossly intact, no focal deficits, moving all 4 extremities Psychiatric: Normal mood and affect  Laboratory Data: Results for orders placed or performed in visit on 01/26/21  BLADDER SCAN AMB NON-IMAGING  Result Value Ref Range   Scan Result 188mL    Pertinent Imaging: Results for orders placed during the hospital encounter of 01/25/21  Abdomen 1 view (KUB)  Narrative CLINICAL DATA:  Kidney stent.  EXAM: ABDOMEN - 1 VIEW  COMPARISON:  December 14, 2019.  FINDINGS: Possible 3 mm right renal calculus versus hyperdense content in the fecal stream. Moderate to large colonic stool and gas obscures a large portion  of bilateral kidneys and part of the ureters. Nonobstructive bowel gas pattern. Degenerative changes of the spine with levocurvature of the upper lumbar spine. Vascular calcifications.  IMPRESSION: Possible 3 mm right renal calculus versus hyperdense content in the fecal stream. Moderate to large colonic stool and gas obscures a large portion of bilateral kidneys and part of the ureters.   Electronically Signed By: Margaretha Sheffield MD On: 01/26/2021 13:55  I personally reviewed the images referenced above and no apparent stones of the bilateral ureters.  Assessment & Plan:   1. History of nephrolithiasis Stable on KUB today.  Patient prefers to follow-up annually with serial KUBs.  I am in agreement with this plan.  2. Nocturia Counseled her to start with lifestyle modifications including fluid restriction after dinnertime, elevating  her legs in the afternoon/evening, voiding before bedtime, and following up with her PCP regarding a possible sleep study.  If her nocturia persist despite these changes, we will consider starting pharmacotherapy. - BLADDER SCAN AMB NON-IMAGING  Return in about 1 year (around 01/26/2022) for Stone f/u with KUB prior.  Debroah Loop, PA-C  [facility] [address], [place], [place] [address] [phone]

## 2021-01-26 NOTE — Addendum Note (Signed)
Addended by: Mickle Plumb on: 01/26/2021 05:16 PM   Modules accepted: Level of Service

## 2021-01-26 NOTE — Patient Instructions (Addendum)
1. Don't drink fluids after dinnertime. Sips with medications are okay. 2. Elevate your legs in the afternoon/evening before bed. 3. Urinate every night right before you go to bed. 4. Consider talking to Dr. Derrel Nip about getting a sleep study done to check you for possible sleep apnea. 5. If your nighttime urination continues, call me to set up an appointment and discuss starting medication.

## 2021-02-06 ENCOUNTER — Telehealth: Payer: Self-pay | Admitting: Internal Medicine

## 2021-02-06 ENCOUNTER — Other Ambulatory Visit: Payer: Self-pay

## 2021-02-06 MED ORDER — ONETOUCH ULTRA VI STRP: ORAL_STRIP | 10 refills | Status: DC

## 2021-02-06 NOTE — Telephone Encounter (Signed)
PT called in to state their meter ran out Thursday and they need a new prescription called in for their [facility] ULTRA test strip.

## 2021-02-06 NOTE — Telephone Encounter (Signed)
OneTouch Ultra Test Strip has been refilled.

## 2021-02-07 NOTE — Telephone Encounter (Signed)
Patient called and said her OneTouch meter stopped working. She needs a prescription for a new meter not test stripes.

## 2021-02-08 MED ORDER — ONETOUCH ULTRA 2 W/DEVICE KIT: PACK | 0 refills | Status: DC

## 2021-02-08 NOTE — Addendum Note (Signed)
Addended by: Adair Laundry on: 02/08/2021 08:28 AM   Modules accepted: Orders

## 2021-02-08 NOTE — Telephone Encounter (Signed)
Meter has been sent in and pt is aware.

## 2021-02-22 ENCOUNTER — Ambulatory Visit (INDEPENDENT_AMBULATORY_CARE_PROVIDER_SITE_OTHER): Payer: Medicare HMO | Admitting: Internal Medicine

## 2021-02-22 ENCOUNTER — Encounter: Payer: Self-pay | Admitting: Internal Medicine

## 2021-02-22 ENCOUNTER — Other Ambulatory Visit: Payer: Self-pay

## 2021-02-22 DIAGNOSIS — H6123 Impacted cerumen, bilateral: Secondary | ICD-10-CM

## 2021-02-22 DIAGNOSIS — D696 Thrombocytopenia, unspecified: Secondary | ICD-10-CM | POA: Diagnosis not present

## 2021-02-22 DIAGNOSIS — E559 Vitamin D deficiency, unspecified: Secondary | ICD-10-CM

## 2021-02-22 DIAGNOSIS — E118 Type 2 diabetes mellitus with unspecified complications: Secondary | ICD-10-CM

## 2021-02-22 DIAGNOSIS — E034 Atrophy of thyroid (acquired): Secondary | ICD-10-CM | POA: Diagnosis not present

## 2021-02-22 DIAGNOSIS — Z87442 Personal history of urinary calculi: Secondary | ICD-10-CM | POA: Diagnosis not present

## 2021-02-22 DIAGNOSIS — E1169 Type 2 diabetes mellitus with other specified complication: Secondary | ICD-10-CM | POA: Diagnosis not present

## 2021-02-22 DIAGNOSIS — E785 Hyperlipidemia, unspecified: Secondary | ICD-10-CM

## 2021-02-22 DIAGNOSIS — R319 Hematuria, unspecified: Secondary | ICD-10-CM

## 2021-02-22 LAB — CBC WITH DIFFERENTIAL/PLATELET
Basophils Absolute: 0 10*3/uL (ref 0.0–0.1)
Basophils Relative: 0.6 % (ref 0.0–3.0)
Eosinophils Absolute: 0.1 10*3/uL (ref 0.0–0.7)
Eosinophils Relative: 2.6 % (ref 0.0–5.0)
HCT: 36.2 % (ref 36.0–46.0)
Hemoglobin: 12.1 g/dL (ref 12.0–15.0)
Lymphocytes Relative: 22.1 % (ref 12.0–46.0)
Lymphs Abs: 1.1 10*3/uL (ref 0.7–4.0)
MCHC: 33.4 g/dL (ref 30.0–36.0)
MCV: 91 fl (ref 78.0–100.0)
Monocytes Absolute: 0.9 10*3/uL (ref 0.1–1.0)
Monocytes Relative: 18.7 % — ABNORMAL HIGH (ref 3.0–12.0)
Neutro Abs: 2.8 10*3/uL (ref 1.4–7.7)
Neutrophils Relative %: 56 % (ref 43.0–77.0)
Platelets: 158 10*3/uL (ref 150.0–400.0)
RBC: 3.98 Mil/uL (ref 3.87–5.11)
RDW: 14 % (ref 11.5–15.5)
WBC: 4.9 10*3/uL (ref 4.0–10.5)

## 2021-02-22 LAB — URINALYSIS, ROUTINE W REFLEX MICROSCOPIC
Bilirubin Urine: NEGATIVE
Ketones, ur: NEGATIVE
Leukocytes,Ua: NEGATIVE
Nitrite: NEGATIVE
Specific Gravity, Urine: 1.025 (ref 1.000–1.030)
Total Protein, Urine: NEGATIVE
Urine Glucose: NEGATIVE
Urobilinogen, UA: 1 (ref 0.0–1.0)
pH: 6 (ref 5.0–8.0)

## 2021-02-22 LAB — MICROALBUMIN / CREATININE URINE RATIO
Creatinine,U: 132.4 mg/dL
Microalb Creat Ratio: 0.8 mg/g (ref 0.0–30.0)
Microalb, Ur: 1 mg/dL (ref 0.0–1.9)

## 2021-02-22 LAB — COMPREHENSIVE METABOLIC PANEL WITH GFR
ALT: 16 U/L (ref 0–35)
AST: 17 U/L (ref 0–37)
Albumin: 4.3 g/dL (ref 3.5–5.2)
Alkaline Phosphatase: 92 U/L (ref 39–117)
BUN: 23 mg/dL (ref 6–23)
CO2: 26 meq/L (ref 19–32)
Calcium: 9 mg/dL (ref 8.4–10.5)
Chloride: 107 meq/L (ref 96–112)
Creatinine, Ser: 0.72 mg/dL (ref 0.40–1.20)
GFR: 79.53 mL/min
Glucose, Bld: 121 mg/dL — ABNORMAL HIGH (ref 70–99)
Potassium: 4.2 meq/L (ref 3.5–5.1)
Sodium: 140 meq/L (ref 135–145)
Total Bilirubin: 0.5 mg/dL (ref 0.2–1.2)
Total Protein: 6.4 g/dL (ref 6.0–8.3)

## 2021-02-22 LAB — LIPID PANEL
Cholesterol: 121 mg/dL (ref 0–200)
HDL: 55.6 mg/dL
LDL Cholesterol: 45 mg/dL (ref 0–99)
NonHDL: 65.64
Total CHOL/HDL Ratio: 2
Triglycerides: 102 mg/dL (ref 0.0–149.0)
VLDL: 20.4 mg/dL (ref 0.0–40.0)

## 2021-02-22 LAB — VITAMIN D 25 HYDROXY (VIT D DEFICIENCY, FRACTURES): VITD: 37.61 ng/mL (ref 30.00–100.00)

## 2021-02-22 LAB — HEMOGLOBIN A1C: Hgb A1c MFr Bld: 5.9 % (ref 4.6–6.5)

## 2021-02-22 MED ORDER — GABAPENTIN 100 MG PO CAPS: 100.0000 mg | ORAL_CAPSULE | Freq: Three times a day (TID) | ORAL | 3 refills | Status: DC

## 2021-02-22 NOTE — Progress Notes (Signed)
Subjective:  Patient ID: [name], female    DOB: Sep 21, 1940  Age: 80 y.o. MRN: [id number]  CC: Diagnoses of Vitamin D deficiency, Hyperlipidemia associated with type 2 diabetes mellitus ([place]), DM type 2, controlled, with complication ([place]), Thrombocytopenia ([place]), Hematuria, unspecified type, Bilateral hearing loss due to cerumen impaction, History of nephrolithiasis, and Hypothyroidism due to acquired atrophy of thyroid were pertinent to this visit.  HPI LEKISHA MCGHEE presents for follow yp on type 2 DM and other issues   This visit occurred during the SARS-CoV-2 public health emergency.  Safety protocols were in place, including screening questions prior to the visit, additional usage of staff PPE, and extensive cleaning of exam room while observing appropriate contact time as indicated for disinfecting solutions.   Seeing [facility]  for sciatica.  Declined PT. Marland Kitchen  No MRI done   doing the wrong exercises, back extension exercise demonstrated. currenlty her pain is MANAGED WITH TYLENOL.  Has a hard mattress which she thinks is aggravating her pain, but unable  to decide on a new mattress . Still doing her own housework,  yardwork , Social research officer, government.  FLIPS HER OWN MATTRESS REGULARLY   Received cortisone shots in  right knee,  2 total.    Saw Urology for Kidney stone follow up  no current pain  or hematuria    T2DM:  She feels generally well, is walking about 3  times per week and doing yard work on the other days.  She is checking blood sugars once daily at variable times.  BS have been under 130 fasting and < 150 post prandially.  Denies any recent hypoglyemic events.  Taking his medications as directed. Following a carbohydrate modified diet 6 days per week. Denies numbness, burning and tingling of extremities. Appetite is good.      Outpatient Medications Prior to Visit  Medication Sig Dispense Refill   acetaminophen (TYLENOL) 650 MG CR tablet Take 1,300 mg by mouth 2 (two) times daily.      ALPRAZolam (XANAX) 0.25 MG tablet Take 0.5 tablets (0.125 mg total) by mouth at bedtime as needed for anxiety. 15 tablet 5   atorvastatin (LIPITOR) 20 MG tablet TAKE 1 TABLET BY MOUTH EVERY DAY 90 tablet 1   Blood Glucose Monitoring Suppl (ONE TOUCH ULTRA 2) w/Device KIT Use to check blood sugars once daily. 1 kit 0   cholecalciferol (VITAMIN D) 25 MCG (1000 UNIT) tablet Take 2 tablets (2,000 Units total) by mouth daily. 90 tablet 1   glipiZIDE (GLUCOTROL) 10 MG tablet TAKE 1 TABLET BY MOUTH TWICE A DAY BEFORE A MEAL 180 tablet 1   glipiZIDE (GLUCOTROL) 5 MG tablet Take 1 tablet (5 mg total) by mouth 2 (two) times daily before a meal. 180 tablet 1   glucose blood (ONETOUCH ULTRA) test strip USE AS DIRECTED 100 strip 10   levothyroxine (SYNTHROID) 88 MCG tablet TAKE 1 TABLET BY MOUTH EVERY DAY ON EMPTY STOMACH WITH WATER AT LEAST 30-60 MINS BEFORE BREAKFAST 90 tablet 3   losartan (COZAAR) 50 MG tablet TAKE 1 TABLET BY MOUTH EVERY DAY 90 tablet 3   metFORMIN (GLUCOPHAGE-XR) 500 MG 24 hr tablet TAKE 1 TABLET BY MOUTH EVERY DAY WITH BREAKFAST 90 tablet 3   pioglitazone (ACTOS) 15 MG tablet TAKE 1 TABLET (15 MG TOTAL) BY MOUTH DAILY WITH SUPPER. 90 tablet 0   meloxicam (MOBIC) 7.5 MG tablet TAKE 1 TABLET (7.5 MG TOTAL) BY MOUTH DAILY. AS NEEDED FOR KNEE PAIN 90 tablet 1  No facility-administered medications prior to visit.    Review of Systems;  Patient denies headache, fevers, malaise, unintentional weight loss, skin rash, eye pain, sinus congestion and sinus pain, sore throat, dysphagia,  hemoptysis , cough, dyspnea, wheezing, chest pain, palpitations, orthopnea, edema, abdominal pain, nausea, melena, diarrhea, constipation, flank pain, dysuria, hematuria, urinary  Frequency, nocturia, numbness, tingling, seizures,  Focal weakness, Loss of consciousness,  Tremor, insomnia, depression, anxiety, and suicidal ideation.      Objective:  BP (!) 146/70 (BP Location: Left Arm, Patient Position:  Sitting, Cuff Size: Normal)   Pulse (!) 56   Temp (!) 96.2 F (35.7 C) (Temporal)   Resp 16   Ht '5\' 3"'  (1.6 m)   Wt 155 lb 12.8 oz (70.7 kg)   SpO2 (!) 56%   BMI 27.60 kg/m   BP Readings from Last 3 Encounters:  02/24/21 139/74  02/22/21 (!) 146/70  01/26/21 (!) 174/73    Wt Readings from Last 3 Encounters:  02/24/21 155 lb (70.3 kg)  02/22/21 155 lb 12.8 oz (70.7 kg)  01/26/21 157 lb (71.2 kg)    General appearance: alert, cooperative and appears stated age Ears: normal TM's and external ear canals both ears Throat: lips, mucosa, and tongue normal; teeth and gums normal Neck: no adenopathy, no carotid bruit, supple, symmetrical, trachea midline and thyroid not enlarged, symmetric, no tenderness/mass/nodules Back: symmetric, no curvature. ROM normal. No CVA tenderness. Lungs: clear to auscultation bilaterally Heart: regular rate and rhythm, S1, S2 normal, no murmur, click, rub or gallop Abdomen: soft, non-tender; bowel sounds normal; no masses,  no organomegaly Pulses: 2+ and symmetric Skin: Skin color, texture, turgor normal. No rashes or lesions Lymph nodes: Cervical, supraclavicular, and axillary nodes normal.  Lab Results  Component Value Date   HGBA1C 5.9 02/22/2021   HGBA1C 5.9 10/17/2020   HGBA1C 5.9 04/11/2020    Lab Results  Component Value Date   CREATININE 0.72 02/22/2021   CREATININE 0.76 10/17/2020   CREATININE 0.69 04/11/2020    Lab Results  Component Value Date   WBC 4.9 02/22/2021   HGB 12.1 02/22/2021   HCT 36.2 02/22/2021   PLT 158.0 02/22/2021   GLUCOSE 121 (H) 02/22/2021   CHOL 121 02/22/2021   TRIG 102.0 02/22/2021   HDL 55.60 02/22/2021   LDLDIRECT 87.0 02/19/2018   LDLCALC 45 02/22/2021   ALT 16 02/22/2021   AST 17 02/22/2021   NA 140 02/22/2021   K 4.2 02/22/2021   CL 107 02/22/2021   CREATININE 0.72 02/22/2021   BUN 23 02/22/2021   CO2 26 02/22/2021   TSH 1.78 10/17/2020   HGBA1C 5.9 02/22/2021   MICROALBUR 1.0 02/22/2021     Abdomen 1 view (KUB)  Result Date: 01/26/2021 CLINICAL DATA:  Kidney stent. EXAM: ABDOMEN - 1 VIEW COMPARISON:  December 14, 2019. FINDINGS: Possible 3 mm right renal calculus versus hyperdense content in the fecal stream. Moderate to large colonic stool and gas obscures a large portion of bilateral kidneys and part of the ureters. Nonobstructive bowel gas pattern. Degenerative changes of the spine with levocurvature of the upper lumbar spine. Vascular calcifications. IMPRESSION: Possible 3 mm right renal calculus versus hyperdense content in the fecal stream. Moderate to large colonic stool and gas obscures a large portion of bilateral kidneys and part of the ureters. Electronically Signed   By: Margaretha Sheffield MD   On: 01/26/2021 13:55    Assessment & Plan:   Problem List Items Addressed This Visit  Unprioritized   Bilateral hearing loss due to cerumen impaction    Needs rn visit to clean ears        DM type 2, controlled, with complication ([place])    With atherosclerosis and hypertension. A1c remains very low  At < 6.0  And her frequent episodes of hypoglycemia have resovled with lower mornimg dose of glipizde to 5 mg .  Continue actos and metformin. Continue asa, statin and ARB  Lab Results  Component Value Date   HGBA1C 5.9 02/22/2021   Lab Results  Component Value Date   LABMICR See below: 02/24/2021   LABMICR See below: 12/14/2019   MICROALBUR 1.0 02/22/2021   MICROALBUR 1.3 10/17/2020            History of nephrolithiasis    Reviewed recent workup.  Today's UA s without RBC's       Hyperlipidemia associated with type 2 diabetes mellitus ([place])   Hypothyroidism    Thyroid function is WNL on current dose.  No current changes needed.    Lab Results  Component Value Date   TSH 1.78 10/17/2020          Thrombocytopenia ([place])   Other Visit Diagnoses     Vitamin D deficiency       Hematuria, unspecified type           I am having Ivin Booty B. Dolecki  start on gabapentin. I am also having her maintain her meloxicam, ALPRAZolam, metFORMIN, atorvastatin, glipiZIDE, levothyroxine, cholecalciferol, losartan, pioglitazone, glipiZIDE, OneTouch Ultra, ONE TOUCH ULTRA 2, and acetaminophen.  Meds ordered this encounter  Medications   gabapentin (NEURONTIN) 100 MG capsule    Sig: Take 1 capsule (100 mg total) by mouth 3 (three) times daily.    Dispense:  90 capsule    Refill:  3    There are no discontinued medications.  Follow-up: No follow-ups on file.   Crecencio Mc, MD

## 2021-02-22 NOTE — Assessment & Plan Note (Signed)
Needs rn visit to clean ears

## 2021-02-22 NOTE — Patient Instructions (Addendum)
You can add up to 2000 mg of acetominophen (tylenol) every day safely  LONGTERM In divided doses (500 mg every 6 hours  Or 1000 mg every 12 hours or 3 of the 650 mg tablets daily )   FOR SHORT PERIODS OF TIME YOU CAN USE 4000 MG ,  BUT NOR MORE THAN ONE WEEK   Start taking mustard daily to prevent cramps   I am prescribing gabapentin to help with your nerve pain.  You can start with 100 mg at bedtime  and either increase the dose at bedtime up to 300 mg gradually OR:  you can add a morning and afternoon dose if needed  If you tolerate this medication and find that it helps your pain , we can increase the dose gradually as needed , up to 2400 mg daily    The most common side effects are fluid retention and dizziness.      Exercise: (supported back extension, standing)  Stand against a counter or sofa with your buttocks resting on the edge and your hands on the edge as well on either side of your back  Slowly lean backwards,  Bending from the waist, until you feel slight discomfort. Restore yourself to vertical position (up straight) Repeat the back extension 10 times , each time extending a little farther).  What should you expect? The pain should recede from the calf/thigh/buttocks but may localize to the lower back  If it does not,  Or if it makes the leg pain worse, STOP doing it.   If it results in improvement,  Repeat the exercise every 2-3 hours while awake and STOP THE OTHER EXERCISES that do the opposite motion (back flexion)

## 2021-02-23 ENCOUNTER — Other Ambulatory Visit: Payer: Self-pay | Admitting: Physician Assistant

## 2021-02-23 ENCOUNTER — Telehealth: Payer: Self-pay | Admitting: Internal Medicine

## 2021-02-23 DIAGNOSIS — R109 Unspecified abdominal pain: Secondary | ICD-10-CM

## 2021-02-23 LAB — URINE CULTURE
MICRO NUMBER:: 12037415
SPECIMEN QUALITY:: ADEQUATE

## 2021-02-23 NOTE — Telephone Encounter (Signed)
FYI I spoke with granddaughter & she is going to see if urology can get patient in to be seen more acutely. She is having a lot of blood when urinating with burning. She fears since symptoms are exactly the same & last UA was clean when pt had kidney stones that she has them again. If she cannot be seen by them then granddaughter was advised UC due to no appointments here in office.

## 2021-02-23 NOTE — Telephone Encounter (Signed)
Pt's granddaughter is requesting urine results.

## 2021-02-23 NOTE — Telephone Encounter (Signed)
Patient's granddaughter, Mendel Ryder called and wanted to know it urine sample is back. She doesn't want her grandmother in the hospital, since last time it was a kidney stone. She says her grandma has blood in urine and back pain.

## 2021-02-24 ENCOUNTER — Ambulatory Visit
Admission: RE | Admit: 2021-02-24 | Discharge: 2021-02-24 | Disposition: A | Discharge status: Home / Self Care | Payer: Medicare HMO | Source: Ambulatory Visit | Attending: Physician Assistant | Admitting: Physician Assistant

## 2021-02-24 ENCOUNTER — Other Ambulatory Visit: Payer: Self-pay

## 2021-02-24 ENCOUNTER — Ambulatory Visit
Admission: RE | Admit: 2021-02-24 | Discharge: 2021-02-24 | Disposition: A | Discharge status: Home / Self Care | Payer: Medicare HMO | Attending: Physician Assistant | Admitting: Physician Assistant

## 2021-02-24 ENCOUNTER — Ambulatory Visit: Payer: Medicare HMO | Admitting: Physician Assistant

## 2021-02-24 ENCOUNTER — Encounter: Payer: Self-pay | Admitting: Physician Assistant

## 2021-02-24 VITALS — BP 139/74 | HR 61 | Ht 63.0 in | Wt 155.0 lb

## 2021-02-24 DIAGNOSIS — R109 Unspecified abdominal pain: Secondary | ICD-10-CM

## 2021-02-24 DIAGNOSIS — Z87442 Personal history of urinary calculi: Secondary | ICD-10-CM

## 2021-02-24 DIAGNOSIS — R58 Hemorrhage, not elsewhere classified: Secondary | ICD-10-CM | POA: Diagnosis not present

## 2021-02-24 LAB — URINALYSIS, COMPLETE
Bilirubin, UA: NEGATIVE
Glucose, UA: NEGATIVE
Ketones, UA: NEGATIVE
Nitrite, UA: NEGATIVE
Protein,UA: NEGATIVE
Specific Gravity, UA: 1.015 (ref 1.005–1.030)
Urobilinogen, Ur: 0.2 mg/dL (ref 0.2–1.0)
pH, UA: 5.5 (ref 5.0–7.5)

## 2021-02-24 LAB — MICROSCOPIC EXAMINATION: Epithelial Cells (non renal): >10 /HPF — AB (ref 0–10)

## 2021-02-24 NOTE — Progress Notes (Signed)
02/24/2021 2:38 PM   [name] [birth year] [id number]  CC: Chief Complaint  Patient presents with   Nephrolithiasis   HPI: [name] is a 80 y.o. female with PMH nephrolithiasis and simple right renal cyst who presents today for evaluation of possible stone episode at the request of her granddaughter.    Today she reports some mild dysuria 2 days ago when she was seen by her PCP for routine physical.  She denies a pelvic exam during her visit, but states she began to notice some blood on the toilet paper after she wiped later that day.  She describes some burning discomfort around the urethral meatus, which was soothed with topical Vaseline.  She denies flank pain.  She is a former social smoker, far less than 1 PPD, who quit approximately 30 years ago.  She denies occupational textile or dye exposures.  She had an intraoperative cystoscopy with Dr. Erlene Quan on 12/07/2019, which was unremarkable.  KUB today with no apparent ureteral stones.  In-office UA today positive for trace lysed blood and 1+ leukocyte esterase; urine microscopy with 11-30 WBCs/HPF, >10 epithelial cells/HPF, and moderate bacteria.   PMH: Past Medical History:  Diagnosis Date   Anxiety    Diabetes mellitus    GERD (gastroesophageal reflux disease)    Hyperlipidemia    Hypertension    Osteoporosis    Positive colorectal cancer screening using Cologuard test 2019   Thyroid disease     Surgical History: Past Surgical History:  Procedure Laterality Date   BREAST CYST ASPIRATION Bilateral 25+ yrs ago   CATARACT EXTRACTION W/PHACO Right 03/02/2015   Procedure: CATARACT EXTRACTION PHACO AND INTRAOCULAR LENS PLACEMENT ([place]) TORIC LENS;  Surgeon: Leandrew Koyanagi, MD;  Location: [facility];  Service: Ophthalmology;  Laterality: Right;  TORIC   CATARACT EXTRACTION W/PHACO Left 03/30/2015   Procedure: CATARACT EXTRACTION PHACO AND INTRAOCULAR LENS PLACEMENT ([facility]);  Surgeon: Leandrew Koyanagi,  MD;  Location: [place];  Service: Ophthalmology;  Laterality: Left;  TORIC  DIABETIC - oral meds   CERVICAL POLYPECTOMY     COLONOSCOPY  01/2007   Dr Olevia Perches   COLONOSCOPY WITH PROPOFOL N/A 10/23/2017   Procedure: COLONOSCOPY WITH PROPOFOL;  Surgeon: Robert Bellow, MD;  [facility];  Service: Endoscopy;  Laterality: N/A;   CYSTOSCOPY W/ RETROGRADES Bilateral 12/07/2019   Procedure: CYSTOSCOPY WITH RETROGRADE PYELOGRAM;  Surgeon: Hollice Espy, MD;  Location: [facility];  Service: Urology;  Laterality: Bilateral;   CYSTOSCOPY/URETEROSCOPY/HOLMIUM LASER/STENT PLACEMENT Right 12/07/2019   Procedure: CYSTOSCOPY/URETEROSCOPY/HOLMIUM LASER/STENT PLACEMENT;  Surgeon: Hollice Espy, MD;  Location: [facility];  Service: Urology;  Laterality: Right;   HUMERUS FRACTURE SURGERY     VAGINAL DELIVERY     WRIST FRACTURE SURGERY      Home Medications:  Allergies as of 02/24/2021       Reactions   Latex Rash   Nickel Rash        Medication List        Accurate as of February 24, 2021  2:39 PM. If you have any questions, ask your nurse or doctor.          acetaminophen 650 MG CR tablet Commonly known as: TYLENOL Take 1,300 mg by mouth 2 (two) times daily.   ALPRAZolam 0.25 MG tablet Commonly known as: XANAX Take 0.5 tablets (0.125 mg total) by mouth at bedtime as needed for anxiety.   atorvastatin 20 MG tablet Commonly known as: LIPITOR TAKE 1 TABLET BY MOUTH EVERY DAY  cholecalciferol 25 MCG (1000 UNIT) tablet Commonly known as: VITAMIN D Take 2 tablets (2,000 Units total) by mouth daily.   gabapentin 100 MG capsule Commonly known as: NEURONTIN Take 1 capsule (100 mg total) by mouth 3 (three) times daily.   glipiZIDE 5 MG tablet Commonly known as: GLUCOTROL Take 1 tablet (5 mg total) by mouth 2 (two) times daily before a meal.   glipiZIDE 10 MG tablet Commonly known as: GLUCOTROL TAKE 1 TABLET BY MOUTH TWICE A DAY BEFORE A MEAL   levothyroxine 88 MCG  tablet Commonly known as: SYNTHROID TAKE 1 TABLET BY MOUTH EVERY DAY ON EMPTY STOMACH WITH WATER AT LEAST 30-60 MINS BEFORE BREAKFAST   losartan 50 MG tablet Commonly known as: COZAAR TAKE 1 TABLET BY MOUTH EVERY DAY   meloxicam 7.5 MG tablet Commonly known as: MOBIC TAKE 1 TABLET (7.5 MG TOTAL) BY MOUTH DAILY. AS NEEDED FOR KNEE PAIN   metFORMIN 500 MG 24 hr tablet Commonly known as: GLUCOPHAGE-XR TAKE 1 TABLET BY MOUTH EVERY DAY WITH BREAKFAST   ONE TOUCH ULTRA 2 w/Device Kit Use to check blood sugars once daily.   OneTouch Ultra test strip Generic drug: glucose blood USE AS DIRECTED   pioglitazone 15 MG tablet Commonly known as: ACTOS TAKE 1 TABLET (15 MG TOTAL) BY MOUTH DAILY WITH SUPPER.        Allergies:  Allergies  Allergen Reactions   Latex Rash   Nickel Rash    Family History: Family History  Problem Relation Age of Onset   Hypertension Mother    Stroke Mother 45       hemorrhagic   Heart disease Father    Cancer Father        Lung CA,  died of AMI while in [place] getting Laetril   Diabetes Brother    Hypertension Brother    Hypertension Son    Heart disease Maternal Grandfather    Cancer Maternal Grandfather    Breast cancer Maternal Grandmother 60   Depression Sister     Social History:   reports that she has never smoked. She has never used smokeless tobacco. She reports that she does not drink alcohol and does not use drugs.  Physical Exam: BP 139/74   Pulse 61   Ht '5\' 3"'  (1.6 m)   Wt 155 lb (70.3 kg)   BMI 27.46 kg/m   Constitutional:  Alert and oriented, no acute distress, nontoxic appearing HEENT: [place], AT Cardiovascular: No clubbing, cyanosis, or edema Respiratory: Normal respiratory effort, no increased work of breathing GU: No urethral caruncle or evidence of active bleeding from the urethra or vagina.  Labia minora are pale and glossy. Skin: No rashes, bruises or suspicious lesions Neurologic: Grossly intact, no focal  deficits, moving all 4 extremities Psychiatric: Normal mood and affect  Laboratory Data: Results for orders placed or performed in visit on 02/24/21  Microscopic Examination   Urine  Result Value Ref Range   WBC, UA 11-30 (A) 0 - 5 /hpf   RBC 0-2 0 - 2 /hpf   Epithelial Cells (non renal) >10 (A) 0 - 10 /hpf   Renal Epithel, UA 0-10 (A) None seen /hpf   Casts Present (A) None seen /lpf   Cast Type Hyaline casts N/A   Bacteria, UA Moderate (A) None seen/Few  Urinalysis, Complete  Result Value Ref Range   Specific Gravity, UA 1.015 1.005 - 1.030   pH, UA 5.5 5.0 - 7.5   Color, UA Yellow Yellow  Appearance Ur Cloudy (A) Clear   Leukocytes,UA 1+ (A) Negative   Protein,UA Negative Negative/Trace   Glucose, UA Negative Negative   Ketones, UA Negative Negative   RBC, UA Trace (A) Negative   Bilirubin, UA Negative Negative   Urobilinogen, Ur 0.2 0.2 - 1.0 mg/dL   Nitrite, UA Negative Negative   Microscopic Examination See below:    Pertinent Imaging: KUB, 02/24/2021: CLINICAL DATA:  Flank pain, gross hematuria   EXAM: ABDOMEN - 1 VIEW   COMPARISON:  01/25/2021   FINDINGS: Supine frontal views of the abdomen and pelvis are obtained. Bowel gas and stool obscure the renal silhouettes. Faint 3 mm calcification left hemiabdomen at the level of the L2/L3 disc space could reflect a left renal or proximal ureteral calculus. No other definite urinary tract calculi. No masses. Stable lower lumbar spondylosis and facet hypertrophy.   IMPRESSION: 1. Bowel gas and stool limit evaluation for underlying renal calculi. 2. Nonspecific 3 mm calcification left mid abdomen, which may reflect a lower pole left renal calculus or proximal left ureteral calculus.     Electronically Signed   By: Randa Ngo M.D.   On: 02/25/2021 10:43  I personally reviewed the images referenced above and note no apparent ureteral stones, though exam is limited by overlying bowel contents.  Assessment  & Plan:   1. 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  No microscopic hematuria on UA.  No radiopaque ureteral calculi on KUB, though her known bilateral renal stones are small and she is statistically likely to pass these spontaneously even if they are not evident on KUB today.  I suspect her discomfort is associated with atrophic vaginitis, though she has no inflamed urethral carbuncle to explain the blood on her toilet paper.  I recommended cystoscopy given this new finding to rule out any changes in the bladder, though my clinical suspicion for this is low especially with an unremarkable cystoscopy last year.  I counseled her that if the cystoscopy is negative and she continues to have bleeding, she should follow-up with GYN to investigate other sources.  She expressed understanding.  We discussed starting her on topical vaginal estrogen cream today, but she wishes to defer this until she undergoes cystoscopy, which is reasonable. - Urinalysis, Complete   Return in about 2 weeks (around 03/10/2021) for Cystoscopy.  Debroah Loop, PA-C  [facility] [address], [place], [place] [address] [phone]

## 2021-02-25 NOTE — Assessment & Plan Note (Signed)
With atherosclerosis and hypertension. A1c remains very low  At < 6.0  And her frequent episodes of hypoglycemia have resovled with lower mornimg dose of glipizde to 5 mg .  Continue actos and metformin. Continue asa, statin and ARB  Lab Results  Component Value Date   HGBA1C 5.9 02/22/2021   Lab Results  Component Value Date   LABMICR See below: 02/24/2021   LABMICR See below: 12/14/2019   MICROALBUR 1.0 02/22/2021   MICROALBUR 1.3 10/17/2020

## 2021-02-25 NOTE — Assessment & Plan Note (Signed)
Thyroid function is WNL on current dose.  No current changes needed.    Lab Results  Component Value Date   TSH 1.78 10/17/2020

## 2021-02-25 NOTE — Assessment & Plan Note (Signed)
Reviewed recent workup.  Today's UA s without RBC's

## 2021-02-27 ENCOUNTER — Ambulatory Visit: Payer: Medicare HMO | Admitting: Physician Assistant

## 2021-03-02 ENCOUNTER — Ambulatory Visit (INDEPENDENT_AMBULATORY_CARE_PROVIDER_SITE_OTHER): Payer: Medicare HMO

## 2021-03-02 ENCOUNTER — Other Ambulatory Visit: Payer: Self-pay

## 2021-03-02 DIAGNOSIS — H6123 Impacted cerumen, bilateral: Secondary | ICD-10-CM

## 2021-03-02 NOTE — Progress Notes (Signed)
Patient presented for ear irrigation due to Bilat cerumen impaction. Patient was informed of the possible side effects of having their ear flushed; light headedness, dizziness, nausea, vomiting and rupture ear drum. Items used or that can be used are the elephant pump, catch basin, ear curettes, hydrogen peroxide (half a bottle for ear irrigation solution), and a stool softener (1-2CC for softening ear wax).  Patient has given a verbal consent to have ear irrigation. patient voiced no concerns nor showed any signs of distress during procedure. Ear canal has become visibly clear.

## 2021-03-14 ENCOUNTER — Telehealth: Payer: Self-pay | Admitting: Physician Assistant

## 2021-03-14 NOTE — Telephone Encounter (Signed)
Patient called and left a voicemail that she wanted to cancel and did not want to reschd it  Thanks, Sharyn Lull

## 2021-03-22 ENCOUNTER — Other Ambulatory Visit: Payer: Self-pay | Admitting: Urology

## 2021-03-27 ENCOUNTER — Other Ambulatory Visit: Payer: Self-pay | Admitting: Internal Medicine

## 2021-03-27 DIAGNOSIS — M9903 Segmental and somatic dysfunction of lumbar region: Secondary | ICD-10-CM | POA: Diagnosis not present

## 2021-03-27 DIAGNOSIS — M5451 Vertebrogenic low back pain: Secondary | ICD-10-CM | POA: Diagnosis not present

## 2021-03-27 DIAGNOSIS — M9904 Segmental and somatic dysfunction of sacral region: Secondary | ICD-10-CM | POA: Diagnosis not present

## 2021-03-27 DIAGNOSIS — M5442 Lumbago with sciatica, left side: Secondary | ICD-10-CM | POA: Diagnosis not present

## 2021-03-27 DIAGNOSIS — M461 Sacroiliitis, not elsewhere classified: Secondary | ICD-10-CM | POA: Diagnosis not present

## 2021-03-27 DIAGNOSIS — M5441 Lumbago with sciatica, right side: Secondary | ICD-10-CM | POA: Diagnosis not present

## 2021-03-28 ENCOUNTER — Other Ambulatory Visit: Payer: Self-pay | Admitting: Urology

## 2021-03-29 DIAGNOSIS — M9903 Segmental and somatic dysfunction of lumbar region: Secondary | ICD-10-CM | POA: Diagnosis not present

## 2021-03-29 DIAGNOSIS — M5442 Lumbago with sciatica, left side: Secondary | ICD-10-CM | POA: Diagnosis not present

## 2021-03-29 DIAGNOSIS — M5441 Lumbago with sciatica, right side: Secondary | ICD-10-CM | POA: Diagnosis not present

## 2021-03-29 DIAGNOSIS — M461 Sacroiliitis, not elsewhere classified: Secondary | ICD-10-CM | POA: Diagnosis not present

## 2021-03-29 DIAGNOSIS — M5451 Vertebrogenic low back pain: Secondary | ICD-10-CM | POA: Diagnosis not present

## 2021-03-29 DIAGNOSIS — M9904 Segmental and somatic dysfunction of sacral region: Secondary | ICD-10-CM | POA: Diagnosis not present

## 2021-03-30 DIAGNOSIS — E113293 Type 2 diabetes mellitus with mild nonproliferative diabetic retinopathy without macular edema, bilateral: Secondary | ICD-10-CM | POA: Diagnosis not present

## 2021-03-30 LAB — HM DIABETES EYE EXAM

## 2021-03-31 DIAGNOSIS — M5442 Lumbago with sciatica, left side: Secondary | ICD-10-CM | POA: Diagnosis not present

## 2021-03-31 DIAGNOSIS — M9904 Segmental and somatic dysfunction of sacral region: Secondary | ICD-10-CM | POA: Diagnosis not present

## 2021-03-31 DIAGNOSIS — M5441 Lumbago with sciatica, right side: Secondary | ICD-10-CM | POA: Diagnosis not present

## 2021-03-31 DIAGNOSIS — M9903 Segmental and somatic dysfunction of lumbar region: Secondary | ICD-10-CM | POA: Diagnosis not present

## 2021-03-31 DIAGNOSIS — M461 Sacroiliitis, not elsewhere classified: Secondary | ICD-10-CM | POA: Diagnosis not present

## 2021-03-31 DIAGNOSIS — M5451 Vertebrogenic low back pain: Secondary | ICD-10-CM | POA: Diagnosis not present

## 2021-04-03 DIAGNOSIS — M9904 Segmental and somatic dysfunction of sacral region: Secondary | ICD-10-CM | POA: Diagnosis not present

## 2021-04-03 DIAGNOSIS — M461 Sacroiliitis, not elsewhere classified: Secondary | ICD-10-CM | POA: Diagnosis not present

## 2021-04-03 DIAGNOSIS — M5451 Vertebrogenic low back pain: Secondary | ICD-10-CM | POA: Diagnosis not present

## 2021-04-03 DIAGNOSIS — M5442 Lumbago with sciatica, left side: Secondary | ICD-10-CM | POA: Diagnosis not present

## 2021-04-03 DIAGNOSIS — M9903 Segmental and somatic dysfunction of lumbar region: Secondary | ICD-10-CM | POA: Diagnosis not present

## 2021-04-03 DIAGNOSIS — M5441 Lumbago with sciatica, right side: Secondary | ICD-10-CM | POA: Diagnosis not present

## 2021-04-07 DIAGNOSIS — M5442 Lumbago with sciatica, left side: Secondary | ICD-10-CM | POA: Diagnosis not present

## 2021-04-07 DIAGNOSIS — M9904 Segmental and somatic dysfunction of sacral region: Secondary | ICD-10-CM | POA: Diagnosis not present

## 2021-04-07 DIAGNOSIS — M9903 Segmental and somatic dysfunction of lumbar region: Secondary | ICD-10-CM | POA: Diagnosis not present

## 2021-04-07 DIAGNOSIS — M5441 Lumbago with sciatica, right side: Secondary | ICD-10-CM | POA: Diagnosis not present

## 2021-04-07 DIAGNOSIS — M5451 Vertebrogenic low back pain: Secondary | ICD-10-CM | POA: Diagnosis not present

## 2021-04-07 DIAGNOSIS — M461 Sacroiliitis, not elsewhere classified: Secondary | ICD-10-CM | POA: Diagnosis not present

## 2021-04-08 ENCOUNTER — Other Ambulatory Visit: Payer: Self-pay | Admitting: Internal Medicine

## 2021-04-10 DIAGNOSIS — M461 Sacroiliitis, not elsewhere classified: Secondary | ICD-10-CM | POA: Diagnosis not present

## 2021-04-10 DIAGNOSIS — M5451 Vertebrogenic low back pain: Secondary | ICD-10-CM | POA: Diagnosis not present

## 2021-04-10 DIAGNOSIS — M5441 Lumbago with sciatica, right side: Secondary | ICD-10-CM | POA: Diagnosis not present

## 2021-04-10 DIAGNOSIS — M9904 Segmental and somatic dysfunction of sacral region: Secondary | ICD-10-CM | POA: Diagnosis not present

## 2021-04-10 DIAGNOSIS — M5442 Lumbago with sciatica, left side: Secondary | ICD-10-CM | POA: Diagnosis not present

## 2021-04-10 DIAGNOSIS — M9903 Segmental and somatic dysfunction of lumbar region: Secondary | ICD-10-CM | POA: Diagnosis not present

## 2021-04-12 DIAGNOSIS — M1711 Unilateral primary osteoarthritis, right knee: Secondary | ICD-10-CM | POA: Diagnosis not present

## 2021-04-14 DIAGNOSIS — M461 Sacroiliitis, not elsewhere classified: Secondary | ICD-10-CM | POA: Diagnosis not present

## 2021-04-14 DIAGNOSIS — M9904 Segmental and somatic dysfunction of sacral region: Secondary | ICD-10-CM | POA: Diagnosis not present

## 2021-04-14 DIAGNOSIS — M5441 Lumbago with sciatica, right side: Secondary | ICD-10-CM | POA: Diagnosis not present

## 2021-04-14 DIAGNOSIS — M9903 Segmental and somatic dysfunction of lumbar region: Secondary | ICD-10-CM | POA: Diagnosis not present

## 2021-04-14 DIAGNOSIS — M5451 Vertebrogenic low back pain: Secondary | ICD-10-CM | POA: Diagnosis not present

## 2021-04-14 DIAGNOSIS — M5442 Lumbago with sciatica, left side: Secondary | ICD-10-CM | POA: Diagnosis not present

## 2021-04-20 DIAGNOSIS — M9903 Segmental and somatic dysfunction of lumbar region: Secondary | ICD-10-CM | POA: Diagnosis not present

## 2021-04-20 DIAGNOSIS — M5441 Lumbago with sciatica, right side: Secondary | ICD-10-CM | POA: Diagnosis not present

## 2021-04-20 DIAGNOSIS — M5442 Lumbago with sciatica, left side: Secondary | ICD-10-CM | POA: Diagnosis not present

## 2021-04-20 DIAGNOSIS — M9904 Segmental and somatic dysfunction of sacral region: Secondary | ICD-10-CM | POA: Diagnosis not present

## 2021-04-20 DIAGNOSIS — M5451 Vertebrogenic low back pain: Secondary | ICD-10-CM | POA: Diagnosis not present

## 2021-04-20 DIAGNOSIS — M461 Sacroiliitis, not elsewhere classified: Secondary | ICD-10-CM | POA: Diagnosis not present

## 2021-04-27 DIAGNOSIS — M9903 Segmental and somatic dysfunction of lumbar region: Secondary | ICD-10-CM | POA: Diagnosis not present

## 2021-04-27 DIAGNOSIS — M5451 Vertebrogenic low back pain: Secondary | ICD-10-CM | POA: Diagnosis not present

## 2021-04-27 DIAGNOSIS — M5442 Lumbago with sciatica, left side: Secondary | ICD-10-CM | POA: Diagnosis not present

## 2021-04-27 DIAGNOSIS — M461 Sacroiliitis, not elsewhere classified: Secondary | ICD-10-CM | POA: Diagnosis not present

## 2021-04-27 DIAGNOSIS — M5441 Lumbago with sciatica, right side: Secondary | ICD-10-CM | POA: Diagnosis not present

## 2021-04-27 DIAGNOSIS — M9904 Segmental and somatic dysfunction of sacral region: Secondary | ICD-10-CM | POA: Diagnosis not present

## 2021-05-04 DIAGNOSIS — M5441 Lumbago with sciatica, right side: Secondary | ICD-10-CM | POA: Diagnosis not present

## 2021-05-04 DIAGNOSIS — M5451 Vertebrogenic low back pain: Secondary | ICD-10-CM | POA: Diagnosis not present

## 2021-05-04 DIAGNOSIS — M9904 Segmental and somatic dysfunction of sacral region: Secondary | ICD-10-CM | POA: Diagnosis not present

## 2021-05-04 DIAGNOSIS — M5442 Lumbago with sciatica, left side: Secondary | ICD-10-CM | POA: Diagnosis not present

## 2021-05-04 DIAGNOSIS — M461 Sacroiliitis, not elsewhere classified: Secondary | ICD-10-CM | POA: Diagnosis not present

## 2021-05-04 DIAGNOSIS — M9903 Segmental and somatic dysfunction of lumbar region: Secondary | ICD-10-CM | POA: Diagnosis not present

## 2021-05-15 DIAGNOSIS — M5442 Lumbago with sciatica, left side: Secondary | ICD-10-CM | POA: Diagnosis not present

## 2021-05-15 DIAGNOSIS — M5451 Vertebrogenic low back pain: Secondary | ICD-10-CM | POA: Diagnosis not present

## 2021-05-15 DIAGNOSIS — M9904 Segmental and somatic dysfunction of sacral region: Secondary | ICD-10-CM | POA: Diagnosis not present

## 2021-05-15 DIAGNOSIS — M5441 Lumbago with sciatica, right side: Secondary | ICD-10-CM | POA: Diagnosis not present

## 2021-05-15 DIAGNOSIS — M9903 Segmental and somatic dysfunction of lumbar region: Secondary | ICD-10-CM | POA: Diagnosis not present

## 2021-05-15 DIAGNOSIS — M461 Sacroiliitis, not elsewhere classified: Secondary | ICD-10-CM | POA: Diagnosis not present

## 2021-05-24 DIAGNOSIS — M461 Sacroiliitis, not elsewhere classified: Secondary | ICD-10-CM | POA: Diagnosis not present

## 2021-05-24 DIAGNOSIS — M5451 Vertebrogenic low back pain: Secondary | ICD-10-CM | POA: Diagnosis not present

## 2021-05-24 DIAGNOSIS — M9903 Segmental and somatic dysfunction of lumbar region: Secondary | ICD-10-CM | POA: Diagnosis not present

## 2021-05-24 DIAGNOSIS — M9904 Segmental and somatic dysfunction of sacral region: Secondary | ICD-10-CM | POA: Diagnosis not present

## 2021-05-24 DIAGNOSIS — M5442 Lumbago with sciatica, left side: Secondary | ICD-10-CM | POA: Diagnosis not present

## 2021-05-24 DIAGNOSIS — M5441 Lumbago with sciatica, right side: Secondary | ICD-10-CM | POA: Diagnosis not present

## 2021-05-29 ENCOUNTER — Telehealth: Payer: Self-pay | Admitting: Internal Medicine

## 2021-05-29 NOTE — Telephone Encounter (Signed)
Prior authorization for Prolia started due 06/20/2021

## 2021-05-30 NOTE — Telephone Encounter (Signed)
[name] Key: [id number] The patient currently has access to the requested medication and a Prior Authorization is not needed for the patient/medication. Patient has been scheduled.

## 2021-05-31 ENCOUNTER — Telehealth: Payer: Self-pay | Admitting: Pharmacist

## 2021-05-31 NOTE — Telephone Encounter (Signed)
Received fax from "[employer]" with listed phone of [phone] and fax of [phone] contacted our office to fill out DME order for [facility] for this patient. The form already had filled out that the patient is on three times daily insulin, which she is not.   Contacted patient to clarify if she requested a Libre 2 CGM order from anywhere. She is not interested in CGM and did not request any orders. She would not qualify for Medicare coverage of CGM due to not being on insulin therapy anyway.   Will notify clinical leadership of potential scam.

## 2021-06-15 ENCOUNTER — Ambulatory Visit (INDEPENDENT_AMBULATORY_CARE_PROVIDER_SITE_OTHER): Payer: Medicare HMO

## 2021-06-15 ENCOUNTER — Other Ambulatory Visit: Payer: Self-pay

## 2021-06-15 DIAGNOSIS — M810 Age-related osteoporosis without current pathological fracture: Secondary | ICD-10-CM | POA: Diagnosis not present

## 2021-06-15 MED ORDER — DENOSUMAB 60 MG/ML ~~LOC~~ SOSY
60.0000 mg | PREFILLED_SYRINGE | Freq: Once | SUBCUTANEOUS | Status: AC
  Administered 2021-06-15: 60 mg via SUBCUTANEOUS

## 2021-06-15 NOTE — Progress Notes (Signed)
Patient presented for 2nd prolia injection to back of right arm. Pt voiced no concerns or discomfort at this time.

## 2021-06-21 DIAGNOSIS — M461 Sacroiliitis, not elsewhere classified: Secondary | ICD-10-CM | POA: Diagnosis not present

## 2021-06-21 DIAGNOSIS — M9903 Segmental and somatic dysfunction of lumbar region: Secondary | ICD-10-CM | POA: Diagnosis not present

## 2021-06-21 DIAGNOSIS — M5441 Lumbago with sciatica, right side: Secondary | ICD-10-CM | POA: Diagnosis not present

## 2021-06-21 DIAGNOSIS — M5451 Vertebrogenic low back pain: Secondary | ICD-10-CM | POA: Diagnosis not present

## 2021-06-21 DIAGNOSIS — M9904 Segmental and somatic dysfunction of sacral region: Secondary | ICD-10-CM | POA: Diagnosis not present

## 2021-06-21 DIAGNOSIS — M5442 Lumbago with sciatica, left side: Secondary | ICD-10-CM | POA: Diagnosis not present

## 2021-07-01 ENCOUNTER — Other Ambulatory Visit: Payer: Self-pay | Admitting: Internal Medicine

## 2021-07-18 DIAGNOSIS — M1711 Unilateral primary osteoarthritis, right knee: Secondary | ICD-10-CM | POA: Diagnosis not present

## 2021-07-20 ENCOUNTER — Telehealth: Payer: Self-pay | Admitting: Internal Medicine

## 2021-07-20 ENCOUNTER — Other Ambulatory Visit: Payer: Self-pay | Admitting: Internal Medicine

## 2021-07-20 DIAGNOSIS — E559 Vitamin D deficiency, unspecified: Secondary | ICD-10-CM

## 2021-07-20 DIAGNOSIS — E785 Hyperlipidemia, unspecified: Secondary | ICD-10-CM

## 2021-07-20 DIAGNOSIS — Z1231 Encounter for screening mammogram for malignant neoplasm of breast: Secondary | ICD-10-CM

## 2021-07-20 DIAGNOSIS — E118 Type 2 diabetes mellitus with unspecified complications: Secondary | ICD-10-CM

## 2021-07-20 DIAGNOSIS — D696 Thrombocytopenia, unspecified: Secondary | ICD-10-CM

## 2021-07-20 DIAGNOSIS — I10 Essential (primary) hypertension: Secondary | ICD-10-CM

## 2021-07-20 DIAGNOSIS — E1169 Type 2 diabetes mellitus with other specified complication: Secondary | ICD-10-CM

## 2021-07-20 DIAGNOSIS — E034 Atrophy of thyroid (acquired): Secondary | ICD-10-CM

## 2021-07-20 NOTE — Telephone Encounter (Signed)
The patient is requesting labs to be done before her 6 month follow up on 08/24/21. Needing orders.

## 2021-07-23 DIAGNOSIS — M1711 Unilateral primary osteoarthritis, right knee: Secondary | ICD-10-CM | POA: Insufficient documentation

## 2021-07-25 NOTE — Telephone Encounter (Signed)
Labs in

## 2021-08-22 ENCOUNTER — Other Ambulatory Visit: Payer: Self-pay

## 2021-08-22 ENCOUNTER — Other Ambulatory Visit (INDEPENDENT_AMBULATORY_CARE_PROVIDER_SITE_OTHER): Payer: Medicare HMO

## 2021-08-22 DIAGNOSIS — E559 Vitamin D deficiency, unspecified: Secondary | ICD-10-CM

## 2021-08-22 DIAGNOSIS — E118 Type 2 diabetes mellitus with unspecified complications: Secondary | ICD-10-CM

## 2021-08-22 DIAGNOSIS — E1169 Type 2 diabetes mellitus with other specified complication: Secondary | ICD-10-CM

## 2021-08-22 DIAGNOSIS — E034 Atrophy of thyroid (acquired): Secondary | ICD-10-CM

## 2021-08-22 DIAGNOSIS — I10 Essential (primary) hypertension: Secondary | ICD-10-CM

## 2021-08-22 DIAGNOSIS — E785 Hyperlipidemia, unspecified: Secondary | ICD-10-CM | POA: Diagnosis not present

## 2021-08-22 DIAGNOSIS — D696 Thrombocytopenia, unspecified: Secondary | ICD-10-CM

## 2021-08-22 LAB — LIPID PANEL
Cholesterol: 140 mg/dL (ref 0–200)
HDL: 59 mg/dL
LDL Cholesterol: 55 mg/dL (ref 0–99)
NonHDL: 80.86
Total CHOL/HDL Ratio: 2
Triglycerides: 127 mg/dL (ref 0.0–149.0)
VLDL: 25.4 mg/dL (ref 0.0–40.0)

## 2021-08-22 LAB — MICROALBUMIN / CREATININE URINE RATIO
Creatinine,U: 72.3 mg/dL
Microalb Creat Ratio: 1.2 mg/g (ref 0.0–30.0)
Microalb, Ur: 0.9 mg/dL (ref 0.0–1.9)

## 2021-08-22 LAB — CBC WITH DIFFERENTIAL/PLATELET
Basophils Absolute: 0 10*3/uL (ref 0.0–0.1)
Basophils Relative: 0.8 % (ref 0.0–3.0)
Eosinophils Absolute: 0.1 10*3/uL (ref 0.0–0.7)
Eosinophils Relative: 2.2 % (ref 0.0–5.0)
HCT: 38.5 % (ref 36.0–46.0)
Hemoglobin: 12.6 g/dL (ref 12.0–15.0)
Lymphocytes Relative: 18.2 % (ref 12.0–46.0)
Lymphs Abs: 0.9 10*3/uL (ref 0.7–4.0)
MCHC: 32.8 g/dL (ref 30.0–36.0)
MCV: 92 fl (ref 78.0–100.0)
Monocytes Absolute: 1.1 10*3/uL — ABNORMAL HIGH (ref 0.1–1.0)
Monocytes Relative: 21.1 % — ABNORMAL HIGH (ref 3.0–12.0)
Neutro Abs: 3 10*3/uL (ref 1.4–7.7)
Neutrophils Relative %: 57.7 % (ref 43.0–77.0)
Platelets: 150 10*3/uL (ref 150.0–400.0)
RBC: 4.19 Mil/uL (ref 3.87–5.11)
RDW: 13.8 % (ref 11.5–15.5)
WBC: 5.1 10*3/uL (ref 4.0–10.5)

## 2021-08-22 LAB — COMPREHENSIVE METABOLIC PANEL WITH GFR
ALT: 11 U/L (ref 0–35)
AST: 13 U/L (ref 0–37)
Albumin: 4.1 g/dL (ref 3.5–5.2)
Alkaline Phosphatase: 82 U/L (ref 39–117)
BUN: 28 mg/dL — ABNORMAL HIGH (ref 6–23)
CO2: 28 meq/L (ref 19–32)
Calcium: 9.5 mg/dL (ref 8.4–10.5)
Chloride: 106 meq/L (ref 96–112)
Creatinine, Ser: 0.75 mg/dL (ref 0.40–1.20)
GFR: 75.47 mL/min
Glucose, Bld: 122 mg/dL — ABNORMAL HIGH (ref 70–99)
Potassium: 4.8 meq/L (ref 3.5–5.1)
Sodium: 140 meq/L (ref 135–145)
Total Bilirubin: 0.7 mg/dL (ref 0.2–1.2)
Total Protein: 6.5 g/dL (ref 6.0–8.3)

## 2021-08-22 LAB — TSH: TSH: 2.13 u[IU]/mL (ref 0.35–5.50)

## 2021-08-22 LAB — VITAMIN D 25 HYDROXY (VIT D DEFICIENCY, FRACTURES): VITD: 28.75 ng/mL — ABNORMAL LOW (ref 30.00–100.00)

## 2021-08-22 LAB — HEMOGLOBIN A1C: Hgb A1c MFr Bld: 6.1 % (ref 4.6–6.5)

## 2021-08-24 ENCOUNTER — Ambulatory Visit: Payer: Medicare HMO | Admitting: Internal Medicine

## 2021-08-25 ENCOUNTER — Other Ambulatory Visit: Payer: Self-pay

## 2021-08-25 ENCOUNTER — Ambulatory Visit
Admission: RE | Admit: 2021-08-25 | Discharge: 2021-08-25 | Disposition: A | Discharge status: Home / Self Care | Payer: Medicare HMO | Source: Ambulatory Visit | Attending: Internal Medicine | Admitting: Internal Medicine

## 2021-08-25 DIAGNOSIS — Z1231 Encounter for screening mammogram for malignant neoplasm of breast: Secondary | ICD-10-CM | POA: Insufficient documentation

## 2021-08-29 IMAGING — MG DIGITAL SCREENING BILAT W/ TOMO W/ CAD
6 of 10 series · 6 of 30 positions shown · non-contrast
Comparison: Previous exam(s).

CLINICAL DATA: Screening.

EXAM:
DIGITAL SCREENING BILATERAL MAMMOGRAM WITH TOMO AND CAD

[R CC synth-2D]
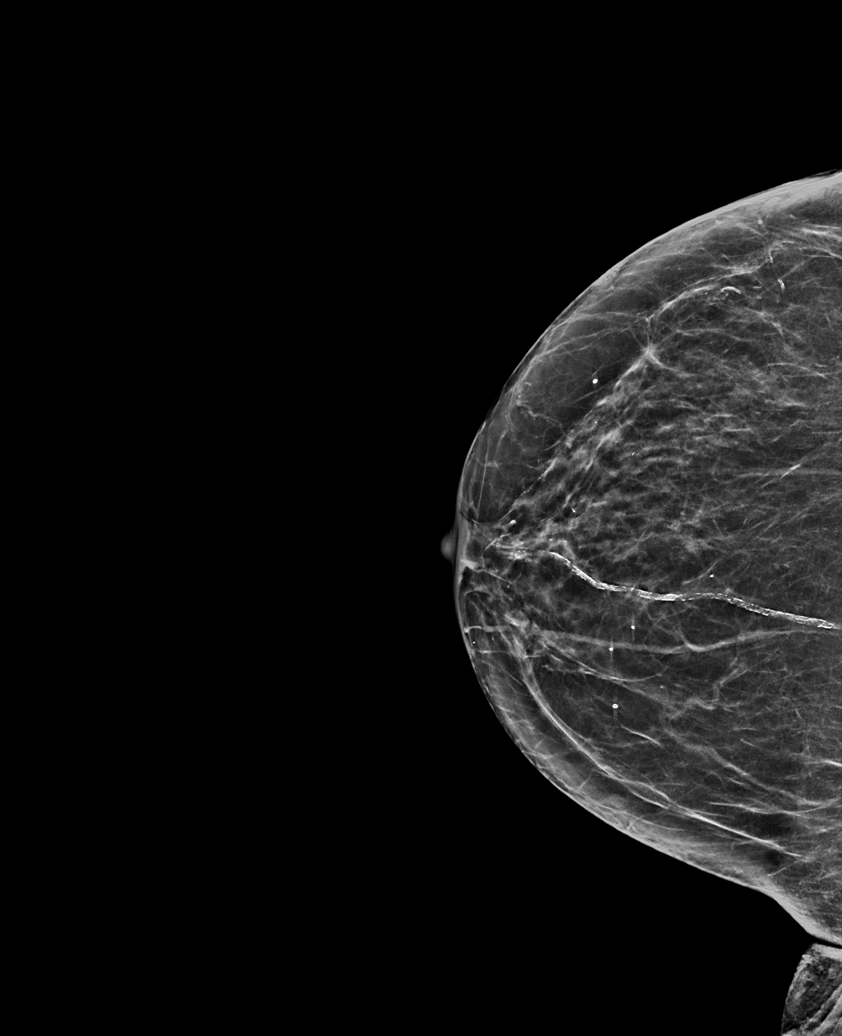

[L CC synth-2D]
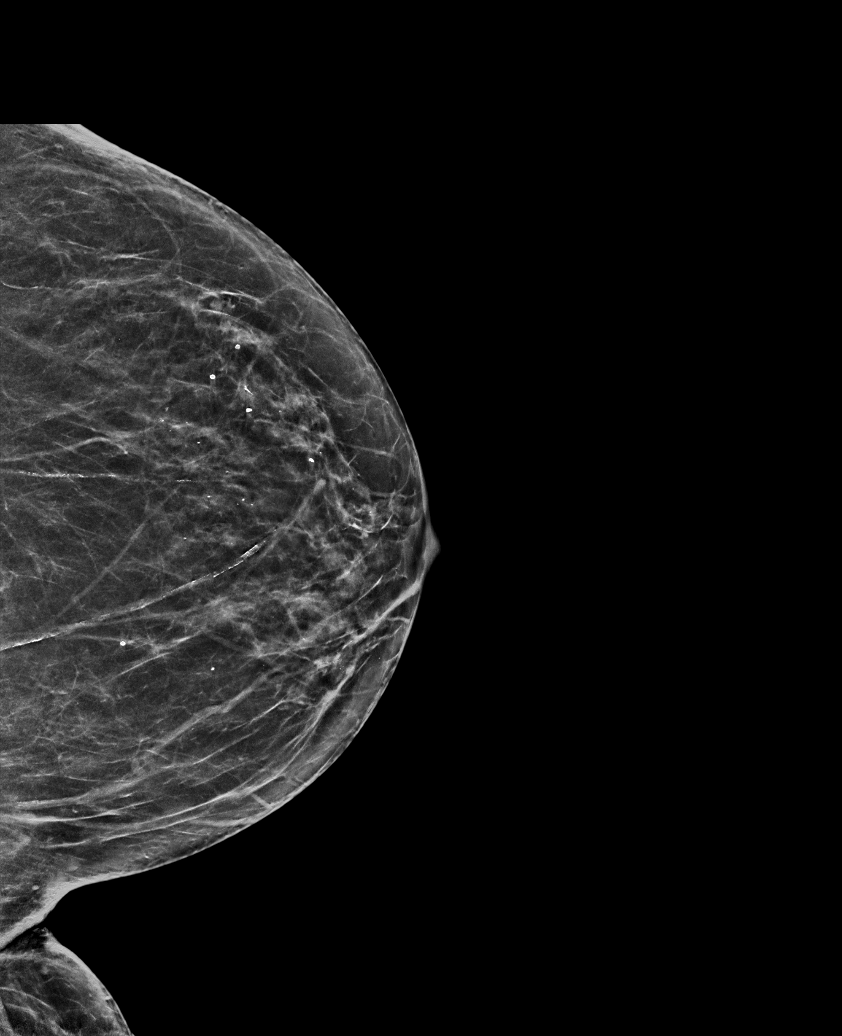

[R MLO synth-2D (1 of 2)]
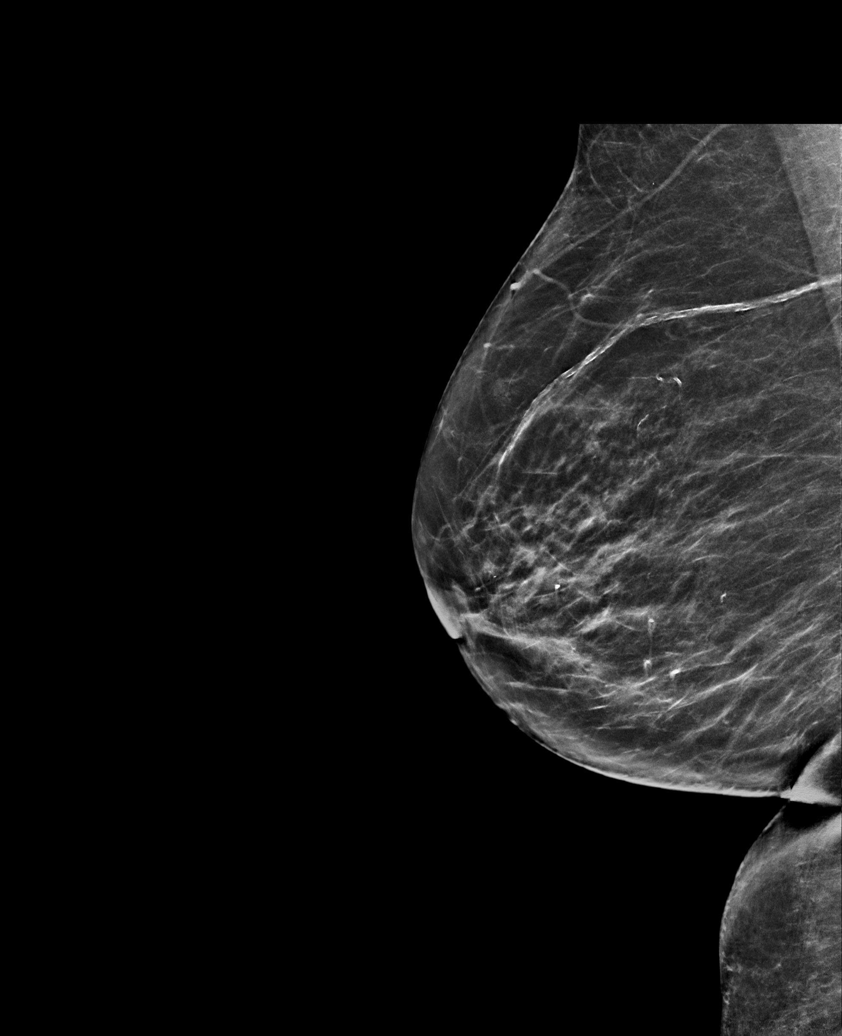

[R MLO synth-2D (2 of 2)]
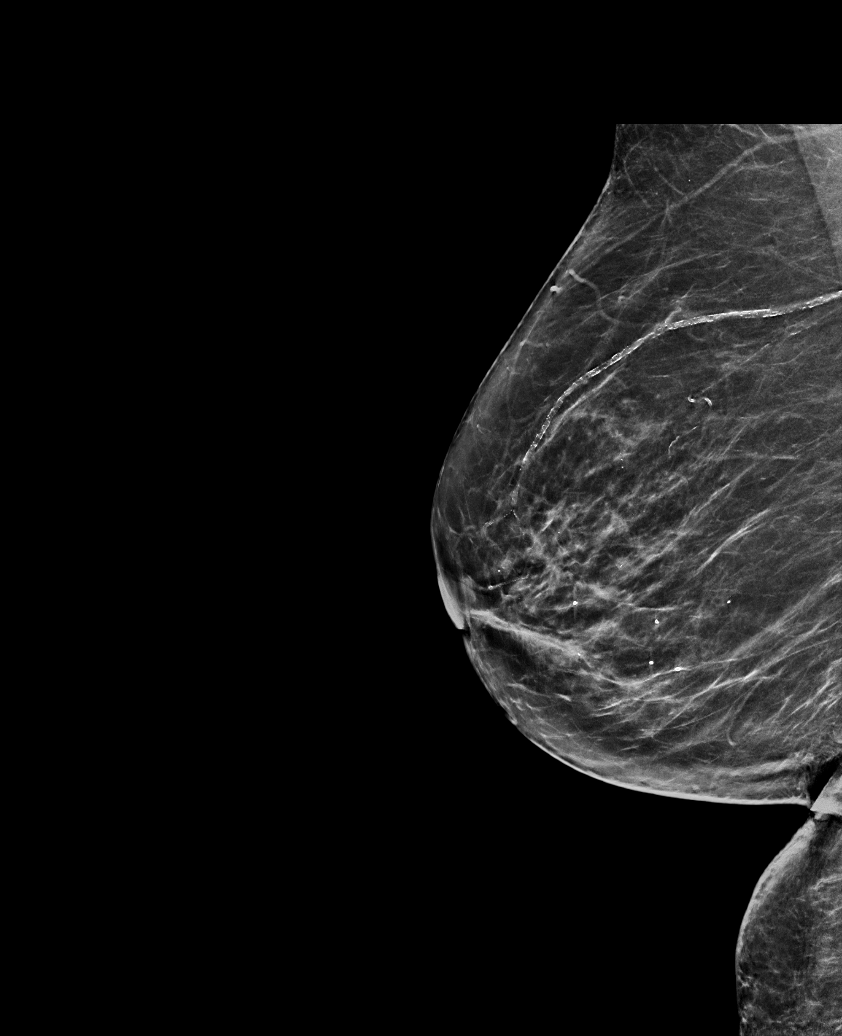

[L MLO synth-2D]
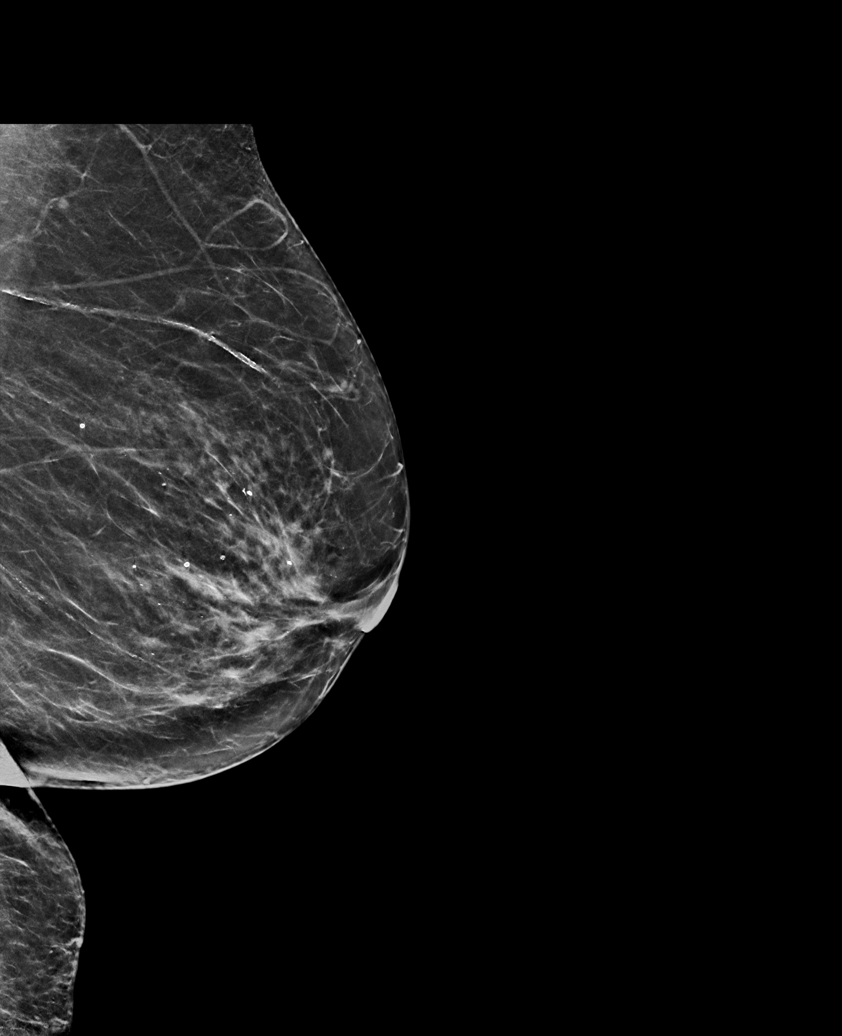

[R MLO tomo · tomo slice 33/66.0]
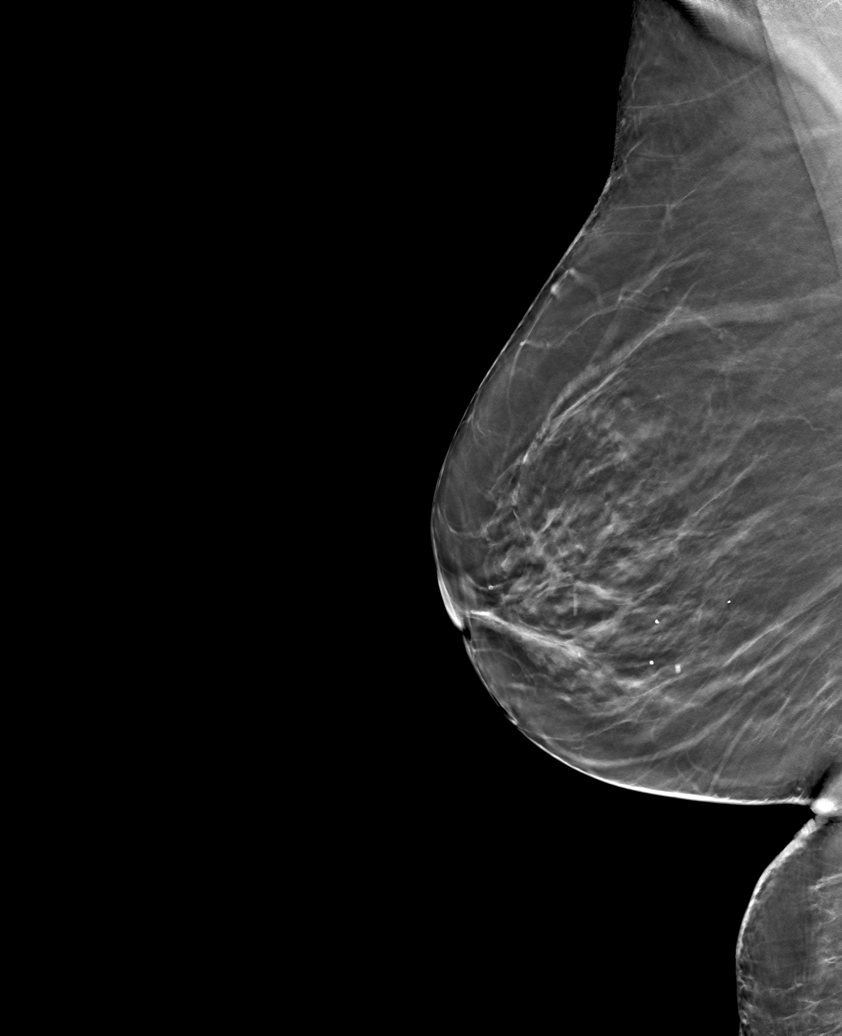

[6 of 30 positions shown; findings below may reference images not displayed]

ACR Breast Density Category b: There are scattered areas of
fibroglandular density.
FINDINGS: There are no findings suspicious for malignancy. Images were
processed with CAD.
IMPRESSION: No mammographic evidence of malignancy. A result letter of this
screening mammogram will be mailed directly to the patient.

RECOMMENDATION:
Screening mammogram in one year. (Code:[id number])

BI-RADS CATEGORY  1: Negative.

## 2021-08-31 ENCOUNTER — Telehealth: Payer: Self-pay | Admitting: Internal Medicine

## 2021-08-31 ENCOUNTER — Ambulatory Visit (INDEPENDENT_AMBULATORY_CARE_PROVIDER_SITE_OTHER): Payer: Medicare HMO | Admitting: Internal Medicine

## 2021-08-31 ENCOUNTER — Encounter: Payer: Self-pay | Admitting: Internal Medicine

## 2021-08-31 DIAGNOSIS — E785 Hyperlipidemia, unspecified: Secondary | ICD-10-CM | POA: Diagnosis not present

## 2021-08-31 DIAGNOSIS — E034 Atrophy of thyroid (acquired): Secondary | ICD-10-CM

## 2021-08-31 DIAGNOSIS — E118 Type 2 diabetes mellitus with unspecified complications: Secondary | ICD-10-CM

## 2021-08-31 DIAGNOSIS — I10 Essential (primary) hypertension: Secondary | ICD-10-CM

## 2021-08-31 DIAGNOSIS — M1711 Unilateral primary osteoarthritis, right knee: Secondary | ICD-10-CM | POA: Diagnosis not present

## 2021-08-31 DIAGNOSIS — M810 Age-related osteoporosis without current pathological fracture: Secondary | ICD-10-CM | POA: Diagnosis not present

## 2021-08-31 DIAGNOSIS — E1169 Type 2 diabetes mellitus with other specified complication: Secondary | ICD-10-CM

## 2021-08-31 MED ORDER — GLIPIZIDE 5 MG PO TABS: ORAL_TABLET | ORAL | 1 refills | Status: DC

## 2021-08-31 NOTE — Assessment & Plan Note (Signed)
Thyroid function is WNL on current dose of 88 mcg daily .  No current changes needed.

## 2021-08-31 NOTE — Telephone Encounter (Signed)
Patient had a virtual with her provider today. She is calling in her BP reading; 125/58, pulse 63.

## 2021-08-31 NOTE — Assessment & Plan Note (Signed)
Discussed treatment options,  Calcium and Vit d requirements.  Receiving Prolia . Second injection was done in October.

## 2021-08-31 NOTE — Progress Notes (Signed)
Telephone Note  This visit type was conducted due to national recommendations for restrictions regarding the COVID-19 pandemic (e.g. social distancing).  This format is felt to be most appropriate for this patient at this time.  All issues noted in this document were discussed and addressed.  No physical exam was performed (except for noted visual exam findings with Video Visits).   I connected withNAME@ on 08/31/21 at 11:30 AM EST by  telephone and verified that I am speaking with the correct person using two identifiers. Location patient: home Location provider: work or home office Persons participating in the virtual visit: patient, provider  I discussed the limitations, risks, security and privacy concerns of performing an evaluation and management service by telephone and the availability of in person appointments. I also discussed with the patient that there may be a patient responsible charge related to this service. The patient expressed understanding and agreed to proceed.  Reason for visit: Follow up on type 2 DM  HPI:   Right knee OA:  using biofreeze and horse liniment. Staying in hot water helps.  Cold weather aggravates it.  No relief with 3 viscosupplementation injections . Knee swells periodically ,  scheduled for Feb 15    T2DM:  She  feels generally well,  But is not  exercising regularly due to severe OA of right knee. . Checking  blood sugars  daily in the morning and in the afternoon if she feels she may be having a hypoglycemic event. .  BS have been under 130 fasting and < 150 post prandially.  Has had occasional  hypoglyemic events in late afternoon, not more than once a month .  Taking  glipizide 2.5 mg in the am and 10 mg in the evening, actos and metformin.  Following a carbohydrate modified diet 6 days per week. Denies numbness, burning and tingling of extremities. Appetite is good.   Anxiety:  has been using alprazolam 1/2 tablet daily since her granddaughter was  involved in an MVA and is on her insurance.       ROS: See pertinent positives and negatives per HPI.  Past Medical History:  Diagnosis Date   Anxiety    Diabetes mellitus    GERD (gastroesophageal reflux disease)    Hyperlipidemia    Hypertension    Osteoporosis    Positive colorectal cancer screening using Cologuard test 2019   Thyroid disease     Past Surgical History:  Procedure Laterality Date   BREAST CYST ASPIRATION Bilateral 25+ yrs ago   CATARACT EXTRACTION W/PHACO Right 03/02/2015   Procedure: CATARACT EXTRACTION PHACO AND INTRAOCULAR LENS PLACEMENT ([place]) TORIC LENS;  Surgeon: Leandrew Koyanagi, MD;  Location: [facility];  Service: Ophthalmology;  Laterality: Right;  TORIC   CATARACT EXTRACTION W/PHACO Left 03/30/2015   Procedure: CATARACT EXTRACTION PHACO AND INTRAOCULAR LENS PLACEMENT ([facility]);  Surgeon: Leandrew Koyanagi, MD;  Location: [facility];  Service: Ophthalmology;  Laterality: Left;  TORIC  DIABETIC - oral meds   CERVICAL POLYPECTOMY     COLONOSCOPY  01/2007   Dr Olevia Perches   COLONOSCOPY WITH PROPOFOL N/A 10/23/2017   Procedure: COLONOSCOPY WITH PROPOFOL;  Surgeon: Robert Bellow, MD;  Location: [facility];  Service: Endoscopy;  Laterality: N/A;   CYSTOSCOPY W/ RETROGRADES Bilateral 12/07/2019   Procedure: CYSTOSCOPY WITH RETROGRADE PYELOGRAM;  Surgeon: Hollice Espy, MD;  Location: [facility];  Service: Urology;  Laterality: Bilateral;   CYSTOSCOPY/URETEROSCOPY/HOLMIUM LASER/STENT PLACEMENT Right 12/07/2019   Procedure: CYSTOSCOPY/URETEROSCOPY/HOLMIUM LASER/STENT PLACEMENT;  Surgeon: Erlene Quan,  Caryl Pina, MD;  Location: [facility];  Service: Urology;  Laterality: Right;   HUMERUS FRACTURE SURGERY     VAGINAL DELIVERY     WRIST FRACTURE SURGERY      Family History  Problem Relation Age of Onset   Hypertension Mother    Stroke Mother 62       hemorrhagic   Heart disease Father    Cancer Father        Lung CA,  died of AMI while in [place]  getting Laetril   Diabetes Brother    Hypertension Brother    Hypertension Son    Heart disease Maternal Grandfather    Cancer Maternal Grandfather    Breast cancer Maternal Grandmother 61   Depression Sister     SOCIAL HX:  reports that she has never smoked. She has never used smokeless tobacco. She reports that she does not drink alcohol and does not use drugs.    Current Outpatient Medications:    ALPRAZolam (XANAX) 0.25 MG tablet, Take 0.5 tablets (0.125 mg total) by mouth at bedtime as needed for anxiety., Disp: 15 tablet, Rfl: 5   atorvastatin (LIPITOR) 20 MG tablet, TAKE 1 TABLET BY MOUTH EVERY DAY, Disp: 90 tablet, Rfl: 1   Blood Glucose Monitoring Suppl (ONE TOUCH ULTRA 2) w/Device KIT, Use to check blood sugars once daily., Disp: 1 kit, Rfl: 0   cholecalciferol (VITAMIN D) 25 MCG (1000 UNIT) tablet, Take 2 tablets (2,000 Units total) by mouth daily., Disp: 90 tablet, Rfl: 1   gabapentin (NEURONTIN) 100 MG capsule, Take 1 capsule (100 mg total) by mouth 3 (three) times daily., Disp: 90 capsule, Rfl: 3   glucose blood (ONETOUCH ULTRA) test strip, USE AS DIRECTED, Disp: 100 strip, Rfl: 10   levothyroxine (SYNTHROID) 88 MCG tablet, TAKE 1 TABLET BY MOUTH EVERY DAY ON EMPTY STOMACH WITH WATER AT LEAST 30-60 MINS BEFORE BREAKFAST, Disp: 90 tablet, Rfl: 3   losartan (COZAAR) 50 MG tablet, TAKE 1 TABLET BY MOUTH EVERY DAY, Disp: 90 tablet, Rfl: 3   meloxicam (MOBIC) 7.5 MG tablet, TAKE 1 TABLET (7.5 MG TOTAL) BY MOUTH DAILY. AS NEEDED FOR KNEE PAIN, Disp: 90 tablet, Rfl: 1   metFORMIN (GLUCOPHAGE-XR) 500 MG 24 hr tablet, TAKE 1 TABLET BY MOUTH EVERY DAY WITH BREAKFAST, Disp: 90 tablet, Rfl: 3   pioglitazone (ACTOS) 15 MG tablet, TAKE 1 TABLET (15 MG TOTAL) BY MOUTH DAILY WITH SUPPER., Disp: 90 tablet, Rfl: 0   acetaminophen (TYLENOL) 650 MG CR tablet, Take 1,300 mg by mouth 2 (two) times daily. (Patient not taking: Reported on 08/31/2021), Disp: , Rfl:    glipiZIDE (GLUCOTROL) 5 MG  tablet, 1/2 tablet at breakfast,  and 2 tablets at dinnertime, Disp: 270 tablet, Rfl: 1  EXAM:  VITALS per patient if applicable:  GENERAL: alert, oriented, appears well and in no acute distress  HEENT: atraumatic, conjunttiva clear, no obvious abnormalities on inspection of external nose and ears  NECK: normal movements of the head and neck  LUNGS: on inspection no signs of respiratory distress, breathing rate appears normal, no obvious gross SOB, gasping or wheezing  CV: no obvious cyanosis  MS: moves all visible extremities without noticeable abnormality  PSYCH/NEURO: pleasant and cooperative, no obvious depression or anxiety, speech and thought processing grossly intact  ASSESSMENT AND PLAN:  Discussed the following assessment and plan:  Primary osteoarthritis of right knee  Osteoporosis, post-menopausal  Hyperlipidemia associated with type 2 diabetes mellitus ([place])  Hypothyroidism due to acquired atrophy of  thyroid  DM type 2, controlled, with complication ([place])  Essential hypertension  Osteoarthritis of right knee The pain is aggravated by any weight bearing and limits her  ability to ambulate long distances. She  not appreciated any significant improvement despite NSAIDs, intra-articular corticosteroid injections, viscosupplementation, and activity modification. She is not using any ambulatory aids. The patient states that the knee pain has progressed to the point that it is significantly interfering with her activities of daily living. She has decided to undergo a TKR in February by Dr Marry Guan.  It is not clear if a preoperative evaluation /clearance is requested,  She will need an EKG for that but barring any arrhtymia on EKG  there are no contraindications or conditions that place her at high risk for complications.     Osteoporosis, post-menopausal Discussed treatment options,  Calcium and Vit d requirements.  Receiving Prolia . Second injection was done in October.     Hyperlipidemia associated with type 2 diabetes mellitus ([place]) With atherosclerosis and hypertension. A1c remains very low  At 6.1 and home fasting sugars are < 120 .  And she is taking 2.5 mg glipizide in the morning,  And 10 mg glipizide in the evening, along with metformin and Actos,  Taking ARB and Statin   Lab Results  Component Value Date   HGBA1C 6.1 08/22/2021   Lab Results  Component Value Date   LABMICR See below: 02/24/2021   LABMICR See below: 12/14/2019   MICROALBUR 0.9 08/22/2021   MICROALBUR 1.0 02/22/2021       Hypothyroidism Thyroid function is WNL on current dose of 88 mcg daily .  No current changes needed.   DM type 2, controlled, with complication With atherosclerosis and hypertension. A1c remains very low  At < 6.0  And her frequent episodes of hypoglycemia have resovled with lower mornimg dose of glipizde to 2. 5 mg .  Continue  glipizide  actos and metformin. Continue asa, statin and ARB  Lab Results  Component Value Date   HGBA1C 6.1 08/22/2021   Lab Results  Component Value Date   LABMICR See below: 02/24/2021   LABMICR See below: 12/14/2019   MICROALBUR 0.9 08/22/2021   MICROALBUR 1.0 02/22/2021       Essential hypertension Well controlled on current regimen. Renal function stable, no changes today.    I discussed the assessment and treatment plan with the patient. The patient was provided an opportunity to ask questions and all were answered. The patient agreed with the plan and demonstrated an understanding of the instructions.   The patient was advised to call back or seek an in-person evaluation if the symptoms worsen or if the condition fails to improve as anticipated.   I spent 30 minutes dedicated to the care of this patient on the date of this encounter to include pre-visit review of his medical history,  non Face-to-face time with the patient , and post visit ordering of testing and therapeutics.    Crecencio Mc, MD

## 2021-08-31 NOTE — Assessment & Plan Note (Addendum)
The pain is aggravated by any weight bearing and limits her  ability to ambulate long distances. She  not appreciated any significant improvement despite NSAIDs, intra-articular corticosteroid injections, viscosupplementation, and activity modification. She is not using any ambulatory aids. The patient states that the knee pain has progressed to the point that it is significantly interfering with her activities of daily living. She has decided to undergo a TKR in February by Dr Marry Guan.  It is not clear if a preoperative evaluation /clearance is requested,  She will need an EKG for that but barring any arrhtymia on EKG  there are no contraindications or conditions that place her at high risk for complications.

## 2021-08-31 NOTE — Assessment & Plan Note (Addendum)
With atherosclerosis and hypertension. A1c remains very low  At 6.1 and home fasting sugars are < 120 .  And she is taking 2.5 mg glipizide in the morning,  And 10 mg glipizide in the evening, along with metformin and Actos,  Taking ARB and Statin   Lab Results  Component Value Date   HGBA1C 6.1 08/22/2021   Lab Results  Component Value Date   LABMICR See below: 02/24/2021   LABMICR See below: 12/14/2019   MICROALBUR 0.9 08/22/2021   MICROALBUR 1.0 02/22/2021

## 2021-08-31 NOTE — Assessment & Plan Note (Signed)
With atherosclerosis and hypertension. A1c remains very low  At < 6.0  And her frequent episodes of hypoglycemia have resovled with lower mornimg dose of glipizde to 2. 5 mg .  Continue  glipizide  actos and metformin. Continue asa, statin and ARB  Lab Results  Component Value Date   HGBA1C 6.1 08/22/2021   Lab Results  Component Value Date   LABMICR See below: 02/24/2021   LABMICR See below: 12/14/2019   MICROALBUR 0.9 08/22/2021   MICROALBUR 1.0 02/22/2021

## 2021-08-31 NOTE — Assessment & Plan Note (Signed)
Well controlled on current regimen. Renal function stable, no changes today. 

## 2021-10-02 NOTE — Discharge Instructions (Signed)
Instructions after Total Knee Replacement   Andersson Larrabee P. Meldrick Buttery, Jr., M.D.     Dept. of Orthopaedics & Sports Medicine  [facility]  [address]  [place], [place]  [address]  Phone: [phone]   Fax: [phone]    DIET: Drink plenty of non-alcoholic fluids. Resume your normal diet. Include foods high in fiber.  ACTIVITY:  You may use crutches or a walker with weight-bearing as tolerated, unless instructed otherwise. You may be weaned off of the walker or crutches by your Physical Therapist.  Do NOT place pillows under the knee. Anything placed under the knee could limit your ability to straighten the knee.   Continue doing gentle exercises. Exercising will reduce the pain and swelling, increase motion, and prevent muscle weakness.   Please continue to use the TED compression stockings for 6 weeks. You may remove the stockings at night, but should reapply them in the morning. Do not drive or operate any equipment until instructed.  WOUND CARE:  Continue to use the PolarCare or ice packs periodically to reduce pain and swelling. You may bathe or shower after the staples are removed at the first office visit following surgery.  MEDICATIONS: You may resume your regular medications. Please take the pain medication as prescribed on the medication. Do not take pain medication on an empty stomach. You have been given a prescription for a blood thinner (Lovenox or Coumadin). Please take the medication as instructed. (NOTE: After completing a 2 week course of Lovenox, take one Enteric-coated aspirin once a day. This along with elevation will help reduce the possibility of phlebitis in your operated leg.) Do not drive or drink alcoholic beverages when taking pain medications.  CALL THE OFFICE FOR: Temperature above 101 degrees Excessive bleeding or drainage on the dressing. Excessive swelling, coldness, or paleness of the toes. Persistent nausea and vomiting.  FOLLOW-UP:  You  should have an appointment to return to the office in 10-14 days after surgery. Arrangements have been made for continuation of Physical Therapy (either home therapy or outpatient therapy).   [facility] Department Directory         [email or web address]       [email or web address]          Cardiology  Appointments: [place] - [phone] [place] - [phone]  Endocrinology  Appointments: [place] - [phone] [place] - [phone]  Gastroenterology  Appointments: [place] - [phone] [place] - [phone]        General Surgery   Appointments: [place] - [phone]  Internal Medicine/Family Medicine  Appointments: [place] - [phone] [place] - [phone] [place] - [phone]  Metabolic and Weigh Loss Surgery  Appointments: [place] - [phone]        Neurology  Appointments: [place] - [phone] [place] - [phone]  Neurosurgery  Appointments: [place] - [phone]  Obstetrics & Gynecology  Appointments: [place] - [phone] [place] - [phone]        Pediatrics  Appointments: [place] - [phone] [place] - [phone]  Physiatry  Appointments: [place] -[phone]  Physical Therapy  Appointments: [place] - [phone] [place] - [phone]        Podiatry  Appointments: [place] - [phone] [place] - [phone]  Pulmonology  Appointments: [place] - [phone]  Rheumatology  Appointments: [place] - [phone]        [place] Location: [facility]  [address] [place], [place]  [address]  [place] Location: [facility] [address] [place], [place]  [address]  [place] Location: [facility] [address] [place], [place]  [address]    

## 2021-10-05 ENCOUNTER — Other Ambulatory Visit: Payer: Self-pay

## 2021-10-05 ENCOUNTER — Other Ambulatory Visit
Admission: RE | Admit: 2021-10-05 | Discharge: 2021-10-05 | Disposition: A | Discharge status: Home / Self Care | Payer: Medicare HMO | Source: Ambulatory Visit | Attending: Orthopedic Surgery | Admitting: Orthopedic Surgery

## 2021-10-05 DIAGNOSIS — M1711 Unilateral primary osteoarthritis, right knee: Secondary | ICD-10-CM | POA: Diagnosis not present

## 2021-10-05 DIAGNOSIS — Z91040 Latex allergy status: Secondary | ICD-10-CM | POA: Insufficient documentation

## 2021-10-05 DIAGNOSIS — Z01818 Encounter for other preprocedural examination: Secondary | ICD-10-CM | POA: Diagnosis present

## 2021-10-05 DIAGNOSIS — E118 Type 2 diabetes mellitus with unspecified complications: Secondary | ICD-10-CM

## 2021-10-05 HISTORY — DX: Hypothyroidism, unspecified: E03.9

## 2021-10-05 HISTORY — DX: Personal history of urinary calculi: Z87.442

## 2021-10-05 LAB — URINALYSIS, ROUTINE W REFLEX MICROSCOPIC
Bilirubin Urine: NEGATIVE
Glucose, UA: NEGATIVE mg/dL
Ketones, ur: NEGATIVE mg/dL
Nitrite: NEGATIVE
Protein, ur: NEGATIVE mg/dL
Specific Gravity, Urine: 1.015 (ref 1.005–1.030)
pH: 5.5 (ref 5.0–8.0)

## 2021-10-05 LAB — COMPREHENSIVE METABOLIC PANEL WITH GFR
ALT: 13 U/L (ref 0–44)
AST: 16 U/L (ref 15–41)
Albumin: 4.2 g/dL (ref 3.5–5.0)
Alkaline Phosphatase: 88 U/L (ref 38–126)
Anion gap: 6 (ref 5–15)
BUN: 21 mg/dL (ref 8–23)
CO2: 27 mmol/L (ref 22–32)
Calcium: 9.5 mg/dL (ref 8.9–10.3)
Chloride: 104 mmol/L (ref 98–111)
Creatinine, Ser: 0.79 mg/dL (ref 0.44–1.00)
GFR, Estimated: >60 mL/min
Glucose, Bld: 98 mg/dL (ref 70–99)
Potassium: 4.2 mmol/L (ref 3.5–5.1)
Sodium: 137 mmol/L (ref 135–145)
Total Bilirubin: 0.8 mg/dL (ref 0.3–1.2)
Total Protein: 7.3 g/dL (ref 6.5–8.1)

## 2021-10-05 LAB — HEMOGLOBIN A1C
Hgb A1c MFr Bld: 5.9 % — ABNORMAL HIGH (ref 4.8–5.6)
Mean Plasma Glucose: 123 mg/dL

## 2021-10-05 LAB — SURGICAL PCR SCREEN
MRSA, PCR: NEGATIVE
Staphylococcus aureus: NEGATIVE

## 2021-10-05 LAB — CBC
HCT: 39 % (ref 36.0–46.0)
Hemoglobin: 13 g/dL (ref 12.0–15.0)
MCH: 30.2 pg (ref 26.0–34.0)
MCHC: 33.3 g/dL (ref 30.0–36.0)
MCV: 90.5 fL (ref 80.0–100.0)
Platelets: 156 10*3/uL (ref 150–400)
RBC: 4.31 MIL/uL (ref 3.87–5.11)
RDW: 13.3 % (ref 11.5–15.5)
WBC: 5.6 10*3/uL (ref 4.0–10.5)
nRBC: 0 % (ref 0.0–0.2)

## 2021-10-05 LAB — URINALYSIS, MICROSCOPIC (REFLEX)

## 2021-10-05 LAB — TYPE AND SCREEN
ABO/RH(D): O POS
Antibody Screen: NEGATIVE

## 2021-10-05 LAB — C-REACTIVE PROTEIN: CRP: <0.5 mg/dL

## 2021-10-05 NOTE — Patient Instructions (Addendum)
Your procedure is scheduled on: Wednesday October 18, 2021. Report to Day Surgery inside [facility] 2nd floor. Stop by admissions desk before getting on elevator. To find out your arrival time please call [phone] between 1PM - 3PM on Tuesday October 17, 2021.  Remember: Instructions that are not followed completely may result in serious medical risk,  up to and including death, or upon the discretion of your surgeon and anesthesiologist your  surgery may need to be rescheduled.     _X__ 1. Do not eat food after midnight the night before your procedure.                 No chewing gum or hard candies. You may drink clear liquids up to 2 hours                 before you are scheduled to arrive for your surgery- DO not drink clear                 liquids within 2 hours of the start of your surgery.                 Clear Liquids include:  water, Black Coffee or Tea (Do not add                 anything to coffee or tea).  __X__2.  On the morning of surgery brush your teeth with toothpaste and water, you                may rinse your mouth with mouthwash if you wish.  Do not swallow any toothpaste or mouthwash.     _X__ 3.  No Alcohol for 24 hours before or after surgery.   _X__ 4.  Do Not Smoke or use e-cigarettes For 24 Hours Prior to Your Surgery.                 Do not use any chewable tobacco products for at least 6 hours prior to                 Surgery.  _X__  5.  Do not use any recreational drugs (marijuana, cocaine, heroin, ecstasy, MDMA or other)                For at least one week prior to your surgery.  Combination of these drugs with anesthesia                May have life threatening results.  ____  6.  Bring all medications with you on the day of surgery if instructed.   __X_ 7.  Notify your doctor if there is any change in your medical condition      (cold, fever, infections).     Do not wear jewelry, make-up, hairpins, clips or nail  polish. Do not wear lotions, powders, or perfumes. You may wear deodorant. Do not shave 48 hours prior to surgery. Men may shave face and neck. Do not bring valuables to the hospital.    [facility] is not responsible for any belongings or valuables.  Contacts, dentures or bridgework may not be worn into surgery. Leave your suitcase in the car. After surgery it may be brought to your room. For patients admitted to the hospital, discharge time is determined by your treatment team.   Patients discharged the day of surgery will not be allowed to drive home.   Make arrangements for someone to be with you for  the first 24 hours of your Same Day Discharge.   __X__ Take these medicines the morning of surgery with A SIP OF WATER:    1. levothyroxine (SYNTHROID) 88 MCG  2.   3.   4.  5.  6.  ____ Fleet Enema (as directed)   __X__ Use CHG Soap (or wipes) as directed  ____ Use Benzoyl Peroxide Gel as instructed  ____ Use inhalers on the day of surgery  __X__ Stop metFORMIN (GLUCOPHAGE-XR) 500 MG 2 days prior to surgery    ____ Take 1/2 of usual insulin dose the night before surgery. No insulin the morning          of surgery.   ____ Call your PCP, cardiologist, or Pulmonologist if taking Coumadin/Plavix/aspirin and ask when to stop before your surgery.   __X__ One Week prior to surgery- Stop Anti-inflammatories such as Ibuprofen, Aleve, Advil, Motrin, meloxicam (MOBIC), diclofenac, etodolac, ketorolac, Toradol, Daypro, piroxicam, Goody's or BC powders. OK TO USE TYLENOL IF NEEDED   __X__ One week prior to surgery- Stop ALL supplements until after surgery.    ____ Bring C-Pap to the hospital.    If you have any questions regarding your pre-procedure instructions,  Please call Pre-admit Testing at [phone]

## 2021-10-06 ENCOUNTER — Other Ambulatory Visit: Payer: Self-pay | Admitting: Internal Medicine

## 2021-10-10 ENCOUNTER — Other Ambulatory Visit: Payer: Self-pay | Admitting: Internal Medicine

## 2021-10-10 LAB — LATEX, IGE: Latex: <0.1 kU/L

## 2021-10-15 ENCOUNTER — Encounter: Payer: Self-pay | Admitting: Orthopedic Surgery

## 2021-10-15 NOTE — H&P (Signed)
ORTHOPAEDIC HISTORY & PHYSICAL [name], [place] - 10/12/2021 8:45 AM EST Formatting of this note is different from the original. [facility] Chief Complaint:   Chief Complaint  Patient presents with   Right Knee - Pain  History & Physical Right Knee 10/18/21 [facility]   History of Present Illness:   [name] is a 81 y.o. female that presents to clinic today for her preoperative history and evaluation. Patient presents unaccompanied. The patient is scheduled to undergo a right total knee arthroplasty on 10/18/21 by Dr. Marry Guan. Her pain began several years ago. The pain is located primarily on the medial and posterior aspects of the knee. She describes her pain as worse with any weightbearing. She reports associated swelling with some giving way of the knee. She denies associated numbness or tingling, denies locking.   The patient's symptoms have progressed to the point that they decrease her quality of life. The patient has previously undergone conservative treatment including NSAIDS and injections to the knee without adequate control of her symptoms.  Denies history of lumbar surgery, DVTs, or significant cardiac history. Patient does report a latex allergy and states that it gives her a rash. IgE testing was negative for latex allergy.  Patient will have her daughter and granddaughter helping her after surgery.   Last A1c was 5.9.  Past Medical, Surgical, Family, Social History, Allergies, Medications:   Past Medical History:  Past Medical History:  Diagnosis Date   Bradycardia   Chicken pox   Diabetes mellitus without complication (CMS-HCC)   Hyperlipidemia   Hypothyroidism   Past Surgical History:  Past Surgical History:  Procedure Laterality Date   LEFT MIDDLE FINGER SURGERY Left 04/20/2020   COLONOSCOPY   TICK SURGICALLY REMOVED RIGHT KNEE Right   WISDOM TEETH   Current Medications:  Current Outpatient Medications   Medication Sig Dispense Refill   acetaminophen (TYLENOL) 650 MG ER tablet Take 1,300 mg by mouth once daily   ALPRAZolam (XANAX) 0.25 MG tablet Take 0.25 mg by mouth nightly as needed   atorvastatin (LIPITOR) 20 MG tablet TAKE 1 TABLET BY MOUTH EVERY DAY   blood glucose diagnostic (ONETOUCH ULTRA TEST) test strip OneTouch Ultra Test strips USE AS DIRECTED   calcium carbonate-vitamin D3 (OS-CAL 500+D) 500 mg(1,250mg ) -200 unit tablet Take 1 tablet by mouth once daily   cholecalciferol (VITAMIN D3) 1000 unit tablet Take 2,000 Units by mouth once daily   glipiZIDE (GLUCOTROL) 10 MG tablet Take 5 mg by mouth 2 (two) times daily before meals Pt takes 1/2 of a 10 mg tablet   GLUCOSE BLOOD test strip USE AS DIRECTED   levothyroxine (SYNTHROID) 88 MCG tablet TAKE 1 TABLET BY MOUTH EVERY DAY ON EMPTY STOMACH WITH WATER AT LEAST 30-60 MINS BEFORE BREAKFAST   losartan (COZAAR) 50 MG tablet TAKE 1 TABLET BY MOUTH EVERY DAY   metFORMIN (GLUCOPHAGE-XR) 500 MG XR tablet Take 1 tablet (500 mg total) by mouth 2 (two) times daily at lunch and dinner   ONETOUCH ULTRA2 METER Misc USE TO CHECK BLOOD SUGARS ONCE DAILY   pioglitazone (ACTOS) 15 MG tablet Take 15 mg by mouth daily with dinner   No current facility-administered medications for this visit.   Allergies:  Allergies  Allergen Reactions   Latex, Natural Rubber Rash   Nickel Rash   Social History:  Social History   Socioeconomic History   Marital status: Widowed   Number of children: 2   Years of  education: 12+  Occupational History   Occupation: Retired  Tobacco Use   Smoking status: Former  Packs/day: 0.10  Years: 5.00  Pack years: 0.50  Types: Cigarettes  Quit date: 1972  Years since quitting: 51.1   Smokeless tobacco: Never  Vaping Use   Vaping Use: Never used  Substance and Sexual Activity   Alcohol use: Never   Drug use: Never   Sexual activity: Not Currently  Partners: Male   Family History:  Family History  Problem  Relation Age of Onset   High blood pressure (Hypertension) Mother   Lung cancer Father   Heart disease Father   COPD Brother   Diabetes type II Brother   Review of Systems:   A 10+ ROS was performed, reviewed, and the pertinent orthopaedic findings are documented in the HPI.   Physical Examination:   BP 136/84 (BP Location: Left upper arm, Patient Position: Sitting, BP Cuff Size: Adult)   Ht 160 cm (5\' 3" )   Wt 71.8 kg (158 lb 6.4 oz)   BMI 28.06 kg/m   Patient is a well-developed, well-nourished female in no acute distress. Patient has normal mood and affect. Patient is alert and oriented to person, place, and time.   HEENT: Atraumatic, normocephalic. Pupils equal and reactive to light. Extraocular motion intact. Noninjected sclera.  Cardiovascular: Regular rate and rhythm, with no murmurs, rubs, or gallops. Distal pulses palpable.  Respiratory: Lungs clear to auscultation bilaterally.   Right Knee: Soft tissue swelling: mild Effusion: none Erythema: none Crepitance: mild Tenderness: medial Alignment: relative varus Mediolateral laxity: medial pseudolaxity Posterior sag: negative Patellar tracking: Good tracking without evidence of subluxation or tilt Atrophy: No significant atrophy.  Quadriceps tone was good. Range of motion: 0/6/116 degrees   Sensation intact over the saphenous, lateral sural cutaneous, superficial fibular, and deep fibular nerve distributions.  Tests Performed/Reviewed:  X-rays  3 views of the right knee were reviewed. Images reveal severe loss of medial compartment joint space with significant osteophyte formation. No fractures or dislocations. No osseous abnormality noted.  Impression:   ICD-10-CM  1. Primary osteoarthritis of right knee M17.11   Plan:   The patient has end-stage degenerative changes of the right knee. It was explained to the patient that the condition is progressive in nature. Having failed conservative treatment, the  patient has elected to proceed with a total joint arthroplasty. The patient will undergo a total joint arthroplasty with Dr. Marry Guan. The risks of surgery, including blood clot and infection, were discussed with the patient. Measures to reduce these risks, including the use of anticoagulation, perioperative antibiotics, and early ambulation were discussed. The importance of postoperative physical therapy was discussed with the patient. The patient elects to proceed with surgery. The patient is instructed to stop all blood thinners prior to surgery. The patient is instructed to call the hospital the day before surgery to learn of the proper arrival time.   Contact our office with any questions or concerns. Follow up as indicated, or sooner should any new problems arise, if conditions worsen, or if they are otherwise concerned.   [name], [facility] [place], [place] [address] Phone: [phone]  This note was generated in part with voice recognition software and I apologize for any typographical errors that were not detected and corrected.  Electronically signed by Megan Fudge, PA at 10/12/2021 5:22 PM EST

## 2021-10-16 ENCOUNTER — Other Ambulatory Visit
Admission: RE | Admit: 2021-10-16 | Discharge: 2021-10-16 | Disposition: A | Discharge status: Home / Self Care | Payer: Medicare HMO | Source: Ambulatory Visit | Attending: Orthopedic Surgery | Admitting: Orthopedic Surgery

## 2021-10-16 DIAGNOSIS — Z01812 Encounter for preprocedural laboratory examination: Secondary | ICD-10-CM | POA: Diagnosis present

## 2021-10-16 DIAGNOSIS — Z20822 Contact with and (suspected) exposure to covid-19: Secondary | ICD-10-CM | POA: Insufficient documentation

## 2021-10-16 DIAGNOSIS — Z01818 Encounter for other preprocedural examination: Secondary | ICD-10-CM

## 2021-10-17 LAB — SARS CORONAVIRUS 2 (TAT 6-24 HRS): SARS Coronavirus 2: NEGATIVE

## 2021-10-18 ENCOUNTER — Observation Stay: Payer: Medicare HMO

## 2021-10-18 ENCOUNTER — Ambulatory Visit: Payer: Medicare HMO | Admitting: Urgent Care

## 2021-10-18 ENCOUNTER — Ambulatory Visit: Payer: Medicare HMO | Admitting: Anesthesiology

## 2021-10-18 ENCOUNTER — Encounter: Payer: Self-pay | Admitting: Orthopedic Surgery

## 2021-10-18 ENCOUNTER — Other Ambulatory Visit: Payer: Self-pay

## 2021-10-18 ENCOUNTER — Observation Stay
Admission: RE | Admit: 2021-10-18 | Discharge: 2021-10-19 | Disposition: A | Discharge status: Home Health | Payer: Medicare HMO | Source: Ambulatory Visit | Attending: Orthopedic Surgery | Admitting: Orthopedic Surgery

## 2021-10-18 ENCOUNTER — Encounter
Admission: RE | Disposition: A | Discharge status: Home Health | Payer: Self-pay | Source: Ambulatory Visit | Attending: Orthopedic Surgery

## 2021-10-18 DIAGNOSIS — E119 Type 2 diabetes mellitus without complications: Secondary | ICD-10-CM | POA: Insufficient documentation

## 2021-10-18 DIAGNOSIS — I10 Essential (primary) hypertension: Secondary | ICD-10-CM | POA: Diagnosis not present

## 2021-10-18 DIAGNOSIS — Z7984 Long term (current) use of oral hypoglycemic drugs: Secondary | ICD-10-CM | POA: Insufficient documentation

## 2021-10-18 DIAGNOSIS — Z87891 Personal history of nicotine dependence: Secondary | ICD-10-CM | POA: Insufficient documentation

## 2021-10-18 DIAGNOSIS — M1711 Unilateral primary osteoarthritis, right knee: Secondary | ICD-10-CM | POA: Diagnosis present

## 2021-10-18 DIAGNOSIS — Z96659 Presence of unspecified artificial knee joint: Secondary | ICD-10-CM

## 2021-10-18 DIAGNOSIS — Z91040 Latex allergy status: Secondary | ICD-10-CM | POA: Insufficient documentation

## 2021-10-18 DIAGNOSIS — E039 Hypothyroidism, unspecified: Secondary | ICD-10-CM | POA: Diagnosis not present

## 2021-10-18 DIAGNOSIS — Z96651 Presence of right artificial knee joint: Secondary | ICD-10-CM | POA: Diagnosis not present

## 2021-10-18 DIAGNOSIS — Z79899 Other long term (current) drug therapy: Secondary | ICD-10-CM | POA: Diagnosis not present

## 2021-10-18 DIAGNOSIS — E118 Type 2 diabetes mellitus with unspecified complications: Secondary | ICD-10-CM

## 2021-10-18 DIAGNOSIS — Z471 Aftercare following joint replacement surgery: Secondary | ICD-10-CM | POA: Diagnosis not present

## 2021-10-18 HISTORY — PX: KNEE ARTHROPLASTY: SHX992

## 2021-10-18 LAB — GLUCOSE, CAPILLARY
Glucose-Capillary: 144 mg/dL — ABNORMAL HIGH (ref 70–99)
Glucose-Capillary: 166 mg/dL — ABNORMAL HIGH (ref 70–99)
Glucose-Capillary: 227 mg/dL — ABNORMAL HIGH (ref 70–99)
Glucose-Capillary: 215 mg/dL — ABNORMAL HIGH (ref 70–99)

## 2021-10-18 LAB — ABO/RH: ABO/RH(D): O POS

## 2021-10-18 SURGERY — COMPUTER ASSISTED TOTAL KNEE ARTHROPLASTY
Anesthesia: General | Site: Knee | Laterality: Right

## 2021-10-18 MED ORDER — HYDROMORPHONE HCL 1 MG/ML IJ SOLN: 0.5000 mg | INTRAMUSCULAR | Status: DC | PRN

## 2021-10-18 MED ORDER — GABAPENTIN 300 MG PO CAPS
ORAL_CAPSULE | ORAL | Status: AC
  Administered 2021-10-18: 300 mg via ORAL
  Filled 2021-10-18: qty 1

## 2021-10-18 MED ORDER — ACETAMINOPHEN 10 MG/ML IV SOLN
INTRAVENOUS | Status: AC
  Administered 2021-10-18: 1000 mg via INTRAVENOUS
  Filled 2021-10-18: qty 100

## 2021-10-18 MED ORDER — CEFAZOLIN SODIUM-DEXTROSE 2-4 GM/100ML-% IV SOLN
INTRAVENOUS | Status: AC
  Administered 2021-10-18: 2 g via INTRAVENOUS
  Filled 2021-10-18: qty 100

## 2021-10-18 MED ORDER — PANTOPRAZOLE SODIUM 40 MG PO TBEC
40.0000 mg | DELAYED_RELEASE_TABLET | Freq: Two times a day (BID) | ORAL | Status: DC
  Administered 2021-10-18 – 2021-10-19 (×2): 40 mg via ORAL
  Filled 2021-10-18: qty 1

## 2021-10-18 MED ORDER — ONDANSETRON HCL 4 MG/2ML IJ SOLN: 4.0000 mg | Freq: Four times a day (QID) | INTRAMUSCULAR | Status: DC | PRN

## 2021-10-18 MED ORDER — PANTOPRAZOLE SODIUM 40 MG PO TBEC
DELAYED_RELEASE_TABLET | ORAL | Status: AC
  Filled 2021-10-18: qty 1

## 2021-10-18 MED ORDER — CHLORHEXIDINE GLUCONATE 4 % EX LIQD: 60.0000 mL | Freq: Once | CUTANEOUS | Status: DC

## 2021-10-18 MED ORDER — ONDANSETRON HCL 4 MG/2ML IJ SOLN: 4.0000 mg | Freq: Once | INTRAMUSCULAR | Status: DC | PRN

## 2021-10-18 MED ORDER — PHENYLEPHRINE HCL-NACL 20-0.9 MG/250ML-% IV SOLN
INTRAVENOUS | Status: DC | PRN
  Administered 2021-10-18: 20 ug/min via INTRAVENOUS

## 2021-10-18 MED ORDER — METOCLOPRAMIDE HCL 10 MG PO TABS
10.0000 mg | ORAL_TABLET | Freq: Three times a day (TID) | ORAL | Status: DC
  Administered 2021-10-18: 10 mg via ORAL

## 2021-10-18 MED ORDER — FERROUS SULFATE 325 (65 FE) MG PO TABS
325.0000 mg | ORAL_TABLET | Freq: Two times a day (BID) | ORAL | Status: DC
  Administered 2021-10-18 – 2021-10-19 (×2): 325 mg via ORAL
  Filled 2021-10-18 (×4): qty 1

## 2021-10-18 MED ORDER — ACETAMINOPHEN 325 MG PO TABS: 325.0000 mg | ORAL_TABLET | Freq: Four times a day (QID) | ORAL | Status: DC | PRN

## 2021-10-18 MED ORDER — ACETAMINOPHEN 10 MG/ML IV SOLN: 1000.0000 mg | Freq: Once | INTRAVENOUS | Status: DC | PRN

## 2021-10-18 MED ORDER — GABAPENTIN 100 MG PO CAPS
ORAL_CAPSULE | ORAL | Status: AC
  Administered 2021-10-18: 100 mg via ORAL
  Filled 2021-10-18: qty 1

## 2021-10-18 MED ORDER — LOSARTAN POTASSIUM 50 MG PO TABS
50.0000 mg | ORAL_TABLET | Freq: Every day | ORAL | Status: DC
  Administered 2021-10-18: 50 mg via ORAL
  Filled 2021-10-18 (×2): qty 1

## 2021-10-18 MED ORDER — PRONTOSAN WOUND IRRIGATION OPTIME
TOPICAL | Status: DC | PRN
  Administered 2021-10-18: 1 "application "

## 2021-10-18 MED ORDER — PHENOL 1.4 % MT LIQD
1.0000 | OROMUCOSAL | Status: DC | PRN
  Filled 2021-10-18: qty 177

## 2021-10-18 MED ORDER — PROPOFOL 1000 MG/100ML IV EMUL
INTRAVENOUS | Status: AC
  Filled 2021-10-18: qty 100

## 2021-10-18 MED ORDER — SODIUM CHLORIDE FLUSH 0.9 % IV SOLN
INTRAVENOUS | Status: AC
  Filled 2021-10-18: qty 80

## 2021-10-18 MED ORDER — MIDAZOLAM HCL 2 MG/2ML IJ SOLN
INTRAMUSCULAR | Status: AC
  Filled 2021-10-18: qty 2

## 2021-10-18 MED ORDER — SODIUM CHLORIDE (PF) 0.9 % IJ SOLN
INTRAMUSCULAR | Status: DC | PRN
  Administered 2021-10-18: 120 mL via INTRAMUSCULAR

## 2021-10-18 MED ORDER — SODIUM CHLORIDE 0.9 % IV SOLN: INTRAVENOUS | Status: DC

## 2021-10-18 MED ORDER — DIPHENHYDRAMINE HCL 12.5 MG/5ML PO ELIX: 12.5000 mg | ORAL_SOLUTION | ORAL | Status: DC | PRN

## 2021-10-18 MED ORDER — FLEET ENEMA 7-19 GM/118ML RE ENEM: 1.0000 | ENEMA | Freq: Once | RECTAL | Status: DC | PRN

## 2021-10-18 MED ORDER — METOCLOPRAMIDE HCL 10 MG PO TABS
ORAL_TABLET | ORAL | Status: AC
  Filled 2021-10-18: qty 1

## 2021-10-18 MED ORDER — BUPIVACAINE HCL (PF) 0.5 % IJ SOLN
INTRAMUSCULAR | Status: AC
  Filled 2021-10-18: qty 10

## 2021-10-18 MED ORDER — OXYCODONE HCL 5 MG PO TABS: 10.0000 mg | ORAL_TABLET | ORAL | Status: DC | PRN

## 2021-10-18 MED ORDER — ONDANSETRON HCL 4 MG/2ML IJ SOLN
INTRAMUSCULAR | Status: DC | PRN
  Administered 2021-10-18: 4 mg via INTRAVENOUS

## 2021-10-18 MED ORDER — CEFAZOLIN SODIUM-DEXTROSE 2-4 GM/100ML-% IV SOLN
INTRAVENOUS | Status: AC
  Filled 2021-10-18: qty 100

## 2021-10-18 MED ORDER — ACETAMINOPHEN 10 MG/ML IV SOLN: 1000.0000 mg | Freq: Four times a day (QID) | INTRAVENOUS | Status: DC

## 2021-10-18 MED ORDER — BUPIVACAINE LIPOSOME 1.3 % IJ SUSP
INTRAMUSCULAR | Status: AC
  Filled 2021-10-18: qty 40

## 2021-10-18 MED ORDER — CEFAZOLIN SODIUM-DEXTROSE 2-3 GM-%(50ML) IV SOLR
INTRAVENOUS | Status: DC | PRN
  Administered 2021-10-18: 2 g via INTRAVENOUS

## 2021-10-18 MED ORDER — ACETAMINOPHEN 10 MG/ML IV SOLN
1000.0000 mg | Freq: Four times a day (QID) | INTRAVENOUS | Status: AC
  Administered 2021-10-19: 1000 mg via INTRAVENOUS
  Filled 2021-10-18: qty 100

## 2021-10-18 MED ORDER — CHLORHEXIDINE GLUCONATE 0.12 % MT SOLN: 15.0000 mL | Freq: Once | OROMUCOSAL | Status: AC

## 2021-10-18 MED ORDER — CHLORHEXIDINE GLUCONATE 0.12 % MT SOLN
OROMUCOSAL | Status: AC
  Administered 2021-10-18: 15 mL via OROMUCOSAL
  Filled 2021-10-18: qty 15

## 2021-10-18 MED ORDER — TRANEXAMIC ACID-NACL 1000-0.7 MG/100ML-% IV SOLN
INTRAVENOUS | Status: DC | PRN
  Administered 2021-10-18: 1000 mg via INTRAVENOUS

## 2021-10-18 MED ORDER — BUPIVACAINE HCL (PF) 0.25 % IJ SOLN
INTRAMUSCULAR | Status: AC
  Filled 2021-10-18: qty 120

## 2021-10-18 MED ORDER — ALPRAZOLAM 0.5 MG PO TABS
ORAL_TABLET | ORAL | Status: AC
  Administered 2021-10-18: 0.125 mg via ORAL
  Filled 2021-10-18: qty 1

## 2021-10-18 MED ORDER — EPHEDRINE 5 MG/ML INJ
INTRAVENOUS | Status: AC
  Filled 2021-10-18: qty 5

## 2021-10-18 MED ORDER — CELECOXIB 200 MG PO CAPS
ORAL_CAPSULE | ORAL | Status: AC
  Administered 2021-10-18: 400 mg via ORAL
  Filled 2021-10-18: qty 2

## 2021-10-18 MED ORDER — FENTANYL CITRATE (PF) 100 MCG/2ML IJ SOLN
25.0000 ug | INTRAMUSCULAR | Status: DC | PRN
  Administered 2021-10-18 (×2): 25 ug via INTRAVENOUS

## 2021-10-18 MED ORDER — ATORVASTATIN CALCIUM 20 MG PO TABS
20.0000 mg | ORAL_TABLET | Freq: Every day | ORAL | Status: DC
  Administered 2021-10-18 – 2021-10-19 (×2): 20 mg via ORAL
  Filled 2021-10-18 (×2): qty 1

## 2021-10-18 MED ORDER — TRANEXAMIC ACID-NACL 1000-0.7 MG/100ML-% IV SOLN
1000.0000 mg | Freq: Once | INTRAVENOUS | Status: AC
  Administered 2021-10-18: 1000 mg via INTRAVENOUS

## 2021-10-18 MED ORDER — ALPRAZOLAM 0.25 MG PO TABS
0.1250 mg | ORAL_TABLET | Freq: Every evening | ORAL | Status: DC | PRN
  Filled 2021-10-18 (×2): qty 1

## 2021-10-18 MED ORDER — ALUM & MAG HYDROXIDE-SIMETH 200-200-20 MG/5ML PO SUSP: 30.0000 mL | ORAL | Status: DC | PRN

## 2021-10-18 MED ORDER — INSULIN ASPART 100 UNIT/ML IJ SOLN
INTRAMUSCULAR | Status: AC
  Administered 2021-10-18: 2 [IU] via SUBCUTANEOUS
  Filled 2021-10-18: qty 1

## 2021-10-18 MED ORDER — GLIPIZIDE 5 MG PO TABS
5.0000 mg | ORAL_TABLET | Freq: Every day | ORAL | Status: DC
  Administered 2021-10-19: 5 mg via ORAL
  Filled 2021-10-18 (×2): qty 1

## 2021-10-18 MED ORDER — TRANEXAMIC ACID-NACL 1000-0.7 MG/100ML-% IV SOLN
INTRAVENOUS | Status: AC
  Filled 2021-10-18: qty 100

## 2021-10-18 MED ORDER — VITAMIN D3 25 MCG (1000 UNIT) PO TABS
1000.0000 [IU] | ORAL_TABLET | Freq: Every day | ORAL | Status: DC
  Administered 2021-10-19: 1000 [IU] via ORAL
  Filled 2021-10-18: qty 1

## 2021-10-18 MED ORDER — GLYCOPYRROLATE 0.2 MG/ML IJ SOLN
INTRAMUSCULAR | Status: AC
  Filled 2021-10-18: qty 1

## 2021-10-18 MED ORDER — OXYCODONE HCL 5 MG PO TABS
5.0000 mg | ORAL_TABLET | ORAL | Status: DC | PRN
  Administered 2021-10-18: 5 mg via ORAL

## 2021-10-18 MED ORDER — GLYCOPYRROLATE 0.2 MG/ML IJ SOLN
INTRAMUSCULAR | Status: DC | PRN
  Administered 2021-10-18: .2 mg via INTRAVENOUS

## 2021-10-18 MED ORDER — GLIPIZIDE 5 MG PO TABS: 5.0000 mg | ORAL_TABLET | ORAL | Status: DC

## 2021-10-18 MED ORDER — INSULIN ASPART 100 UNIT/ML IJ SOLN: 0.0000 [IU] | Freq: Every day | INTRAMUSCULAR | Status: DC

## 2021-10-18 MED ORDER — PIOGLITAZONE HCL 15 MG PO TABS
15.0000 mg | ORAL_TABLET | Freq: Every day | ORAL | Status: DC
  Administered 2021-10-18: 15 mg via ORAL
  Filled 2021-10-18 (×2): qty 1

## 2021-10-18 MED ORDER — GABAPENTIN 300 MG PO CAPS: 300.0000 mg | ORAL_CAPSULE | Freq: Once | ORAL | Status: DC

## 2021-10-18 MED ORDER — DEXAMETHASONE SODIUM PHOSPHATE 10 MG/ML IJ SOLN: 8.0000 mg | Freq: Once | INTRAMUSCULAR | Status: DC

## 2021-10-18 MED ORDER — LEVOTHYROXINE SODIUM 88 MCG PO TABS
88.0000 ug | ORAL_TABLET | Freq: Every day | ORAL | Status: DC
  Administered 2021-10-19: 88 ug via ORAL
  Filled 2021-10-18 (×3): qty 1

## 2021-10-18 MED ORDER — OXYCODONE HCL 5 MG PO TABS: 5.0000 mg | ORAL_TABLET | Freq: Once | ORAL | Status: DC | PRN

## 2021-10-18 MED ORDER — MENTHOL 3 MG MT LOZG
1.0000 | LOZENGE | OROMUCOSAL | Status: DC | PRN
  Filled 2021-10-18: qty 9

## 2021-10-18 MED ORDER — METOCLOPRAMIDE HCL 10 MG PO TABS
ORAL_TABLET | ORAL | Status: AC
  Administered 2021-10-18: 10 mg via ORAL
  Filled 2021-10-18: qty 1

## 2021-10-18 MED ORDER — CELECOXIB 200 MG PO CAPS
ORAL_CAPSULE | ORAL | Status: AC
  Administered 2021-10-18: 200 mg via ORAL
  Filled 2021-10-18: qty 1

## 2021-10-18 MED ORDER — ACETAMINOPHEN 10 MG/ML IV SOLN
INTRAVENOUS | Status: AC
  Filled 2021-10-18: qty 100

## 2021-10-18 MED ORDER — PANTOPRAZOLE SODIUM 40 MG PO TBEC
DELAYED_RELEASE_TABLET | ORAL | Status: AC
  Administered 2021-10-18: 40 mg via ORAL
  Filled 2021-10-18: qty 1

## 2021-10-18 MED ORDER — CEFAZOLIN SODIUM-DEXTROSE 2-4 GM/100ML-% IV SOLN: 2.0000 g | INTRAVENOUS | Status: DC

## 2021-10-18 MED ORDER — ENOXAPARIN SODIUM 30 MG/0.3ML IJ SOSY
30.0000 mg | PREFILLED_SYRINGE | Freq: Two times a day (BID) | INTRAMUSCULAR | Status: DC
  Administered 2021-10-19: 30 mg via SUBCUTANEOUS
  Filled 2021-10-18 (×4): qty 0.3

## 2021-10-18 MED ORDER — PROPOFOL 10 MG/ML IV BOLUS
INTRAVENOUS | Status: DC | PRN
  Administered 2021-10-18 (×2): 10 mg via INTRAVENOUS

## 2021-10-18 MED ORDER — INSULIN ASPART 100 UNIT/ML IJ SOLN
INTRAMUSCULAR | Status: AC
  Filled 2021-10-18: qty 1

## 2021-10-18 MED ORDER — EPHEDRINE SULFATE (PRESSORS) 50 MG/ML IJ SOLN
INTRAMUSCULAR | Status: DC | PRN
  Administered 2021-10-18: 7.5 mg via INTRAVENOUS
  Administered 2021-10-18: 5 mg via INTRAVENOUS

## 2021-10-18 MED ORDER — FAMOTIDINE 20 MG PO TABS
ORAL_TABLET | ORAL | Status: AC
  Administered 2021-10-18: 20 mg via ORAL
  Filled 2021-10-18: qty 1

## 2021-10-18 MED ORDER — METFORMIN HCL ER 500 MG PO TB24
500.0000 mg | ORAL_TABLET | Freq: Every day | ORAL | Status: DC
  Administered 2021-10-19: 500 mg via ORAL
  Filled 2021-10-18 (×2): qty 1

## 2021-10-18 MED ORDER — ONDANSETRON HCL 4 MG PO TABS: 4.0000 mg | ORAL_TABLET | Freq: Four times a day (QID) | ORAL | Status: DC | PRN

## 2021-10-18 MED ORDER — FENTANYL CITRATE (PF) 100 MCG/2ML IJ SOLN
INTRAMUSCULAR | Status: AC
  Filled 2021-10-18: qty 2

## 2021-10-18 MED ORDER — PROPOFOL 500 MG/50ML IV EMUL
INTRAVENOUS | Status: DC | PRN
  Administered 2021-10-18: 50 ug/kg/min via INTRAVENOUS

## 2021-10-18 MED ORDER — DEXAMETHASONE SODIUM PHOSPHATE 10 MG/ML IJ SOLN
INTRAMUSCULAR | Status: AC
  Administered 2021-10-18: 8 mg via INTRAVENOUS
  Filled 2021-10-18: qty 1

## 2021-10-18 MED ORDER — OXYCODONE HCL 5 MG PO TABS
ORAL_TABLET | ORAL | Status: AC
  Filled 2021-10-18: qty 1

## 2021-10-18 MED ORDER — GLIPIZIDE 10 MG PO TABS
10.0000 mg | ORAL_TABLET | Freq: Every day | ORAL | Status: DC
  Administered 2021-10-18: 10 mg via ORAL
  Filled 2021-10-18 (×2): qty 1

## 2021-10-18 MED ORDER — ACETAMINOPHEN 10 MG/ML IV SOLN
INTRAVENOUS | Status: DC | PRN
  Administered 2021-10-18: 1000 mg via INTRAVENOUS

## 2021-10-18 MED ORDER — TRANEXAMIC ACID-NACL 1000-0.7 MG/100ML-% IV SOLN: 1000.0000 mg | INTRAVENOUS | Status: DC

## 2021-10-18 MED ORDER — INSULIN ASPART 100 UNIT/ML IJ SOLN
0.0000 [IU] | Freq: Three times a day (TID) | INTRAMUSCULAR | Status: DC
  Administered 2021-10-18: 5 [IU] via SUBCUTANEOUS
  Administered 2021-10-19: 3 [IU] via SUBCUTANEOUS

## 2021-10-18 MED ORDER — MAGNESIUM HYDROXIDE 400 MG/5ML PO SUSP: 30.0000 mL | Freq: Every day | ORAL | Status: DC

## 2021-10-18 MED ORDER — LACTATED RINGERS IV SOLN: INTRAVENOUS | Status: DC

## 2021-10-18 MED ORDER — ORAL CARE MOUTH RINSE: 15.0000 mL | Freq: Once | OROMUCOSAL | Status: AC

## 2021-10-18 MED ORDER — SODIUM CHLORIDE 0.9 % IR SOLN
Status: DC | PRN
  Administered 2021-10-18: 3000 mL

## 2021-10-18 MED ORDER — CELECOXIB 200 MG PO CAPS: 400.0000 mg | ORAL_CAPSULE | Freq: Once | ORAL | Status: AC

## 2021-10-18 MED ORDER — BUPIVACAINE HCL (PF) 0.5 % IJ SOLN
INTRAMUSCULAR | Status: DC | PRN
  Administered 2021-10-18: 3 mL

## 2021-10-18 MED ORDER — PROPOFOL 10 MG/ML IV BOLUS
INTRAVENOUS | Status: AC
  Filled 2021-10-18: qty 20

## 2021-10-18 MED ORDER — CELECOXIB 200 MG PO CAPS: 200.0000 mg | ORAL_CAPSULE | Freq: Two times a day (BID) | ORAL | Status: DC

## 2021-10-18 MED ORDER — 0.9 % SODIUM CHLORIDE (POUR BTL) OPTIME
TOPICAL | Status: DC | PRN
  Administered 2021-10-18: 500 mL

## 2021-10-18 MED ORDER — FAMOTIDINE 20 MG PO TABS: 20.0000 mg | ORAL_TABLET | Freq: Once | ORAL | Status: AC

## 2021-10-18 MED ORDER — SENNOSIDES-DOCUSATE SODIUM 8.6-50 MG PO TABS
1.0000 | ORAL_TABLET | Freq: Two times a day (BID) | ORAL | Status: DC
  Administered 2021-10-18 – 2021-10-19 (×3): 1 via ORAL
  Filled 2021-10-18 (×4): qty 1

## 2021-10-18 MED ORDER — BISACODYL 10 MG RE SUPP
10.0000 mg | Freq: Every day | RECTAL | Status: DC | PRN
  Filled 2021-10-18: qty 1

## 2021-10-18 MED ORDER — CEFAZOLIN SODIUM-DEXTROSE 2-4 GM/100ML-% IV SOLN: 2.0000 g | Freq: Four times a day (QID) | INTRAVENOUS | Status: AC

## 2021-10-18 MED ORDER — MIDAZOLAM HCL 2 MG/2ML IJ SOLN
INTRAMUSCULAR | Status: DC | PRN
  Administered 2021-10-18: 2 mg via INTRAVENOUS

## 2021-10-18 MED ORDER — GABAPENTIN 100 MG PO CAPS
100.0000 mg | ORAL_CAPSULE | Freq: Three times a day (TID) | ORAL | Status: DC
  Administered 2021-10-18: 100 mg via ORAL

## 2021-10-18 MED ORDER — TRAMADOL HCL 50 MG PO TABS
50.0000 mg | ORAL_TABLET | ORAL | Status: DC | PRN
  Administered 2021-10-19: 50 mg via ORAL

## 2021-10-18 MED ORDER — OXYCODONE HCL 5 MG/5ML PO SOLN: 5.0000 mg | Freq: Once | ORAL | Status: DC | PRN

## 2021-10-18 MED ORDER — ONDANSETRON HCL 4 MG/2ML IJ SOLN
INTRAMUSCULAR | Status: AC
  Filled 2021-10-18: qty 2

## 2021-10-18 MED ORDER — GABAPENTIN 100 MG PO CAPS
ORAL_CAPSULE | ORAL | Status: AC
  Filled 2021-10-18: qty 1

## 2021-10-18 SURGICAL SUPPLY — 73 items
ATTUNE PSFEM RTSZ5 NARCEM KNEE (Femur) ×2 IMPLANT
ATTUNE PSRP INSR SZ5 5 KNEE (Insert) ×2 IMPLANT
BATTERY INSTRU NAVIGATION (MISCELLANEOUS) ×8
BLADE SAW 70X12.5 (BLADE) ×2
BLADE SAW 90X13X1.19 OSCILLAT (BLADE) ×2
BLADE SAW 90X25X1.19 OSCILLAT (BLADE) ×2
BONE CEMENT GENTAMICIN (Cement) ×4 IMPLANT
BSPLAT TIB 5 CMNT ROT PLAT STR (Knees) ×1 IMPLANT
CEMENT HV SMART SET (Cement) IMPLANT
COOLER POLAR GLACIER W/PUMP (MISCELLANEOUS) ×2
CUFF TOURN SGL QUICK 24 (TOURNIQUET CUFF)
CUFF TOURN SGL QUICK 34 (TOURNIQUET CUFF)
CUFF TRNQT CYL 24X4X16.5-23 (TOURNIQUET CUFF)
CUFF TRNQT CYL 34X4.125X (TOURNIQUET CUFF)
DRAPE 3/4 80X56 (DRAPES) ×2
DRAPE INCISE IOBAN 66X45 STRL (DRAPES) ×2
DRSG DERMACEA 8X12 NADH (GAUZE/BANDAGES/DRESSINGS) ×2
DRSG MEPILEX SACRM 8.7X9.8 (GAUZE/BANDAGES/DRESSINGS) ×2
DRSG OPSITE POSTOP 4X14 (GAUZE/BANDAGES/DRESSINGS) ×2
DRSG TEGADERM 4X4.75 (GAUZE/BANDAGES/DRESSINGS) ×2
DURAPREP 26ML APPLICATOR (WOUND CARE) ×4
ELECT CAUTERY BLADE 6.4 (BLADE) ×2
ELECT REM PT RETURN 9FT ADLT (ELECTROSURGICAL) ×2
EX-PIN ORTHOLOCK NAV 4X150 (PIN) ×4
GLOVE SRG 8 PF TXTR STRL LF DI (GLOVE) ×1
GLOVE SURG ENC TEXT LTX SZ7.5 (GLOVE) ×8
GLOVE SURG UNDER POLY LF SZ7.5 (GLOVE) ×2
GLOVE SURG UNDER POLY LF SZ8 (GLOVE) ×2
GOWN STRL REUS W/ TWL LRG LVL3 (GOWN DISPOSABLE) ×2
GOWN STRL REUS W/ TWL XL LVL3 (GOWN DISPOSABLE) ×1
GOWN STRL REUS W/TWL LRG LVL3 (GOWN DISPOSABLE) ×4
GOWN STRL REUS W/TWL XL LVL3 (GOWN DISPOSABLE) ×2
HEMOVAC 400CC 10FR (MISCELLANEOUS) ×2
HOLDER FOLEY CATH W/STRAP (MISCELLANEOUS) ×2
HOLSTER ELECTROSUGICAL PENCIL (MISCELLANEOUS) ×2
IV NS IRRIG 3000ML ARTHROMATIC (IV SOLUTION) ×2
KIT TURNOVER KIT A (KITS) ×2
KNIFE SCULPS 14X20 (INSTRUMENTS) ×2
LABEL OR SOLS (LABEL)
MANIFOLD NEPTUNE II (INSTRUMENTS) ×4
NDL SAFETY ECLIPSE 18X1.5 (NEEDLE) ×1
NDL SPNL 20GX3.5 QUINCKE YW (NEEDLE) ×2 IMPLANT
NEEDLE HYPO 18GX1.5 SHARP (NEEDLE) ×2
NEEDLE SPNL 20GX3.5 QUINCKE YW (NEEDLE) ×4
NS IRRIG 500ML POUR BTL (IV SOLUTION) ×2
PACK TOTAL KNEE (MISCELLANEOUS) ×2
PAD ABD DERMACEA PRESS 5X9 (GAUZE/BANDAGES/DRESSINGS) ×4
PATELLA MEDIAL ATTUN 35MM KNEE (Knees) ×2 IMPLANT
PENCIL SMOKE EVACUATOR COATED (MISCELLANEOUS) ×2
PIN DRILL FIX HALF THREAD (BIT) ×4
PIN DRILL QUICK PACK ×2 IMPLANT
PIN FIXATION 1/8DIA X 3INL (PIN) ×2
PULSAVAC PLUS IRRIG FAN TIP (DISPOSABLE) ×2
SOL PREP PVP 2OZ (MISCELLANEOUS) ×2
SOLUTION PRONTOSAN WOUND 350ML (IRRIGATION / IRRIGATOR) ×2
SPONGE DRAIN TRACH 4X4 STRL 2S (GAUZE/BANDAGES/DRESSINGS) ×2
STAPLER SKIN PROX 35W (STAPLE) ×2
STOCKINETTE IMPERV 14X48 (MISCELLANEOUS)
STRAP TIBIA SHORT (MISCELLANEOUS) ×2
SUCTION FRAZIER HANDLE 10FR (MISCELLANEOUS) ×2
SUCTION TUBE FRAZIER 10FR DISP (MISCELLANEOUS) ×1
SUT VIC AB 0 CT1 36 (SUTURE) ×4
SUT VIC AB 1 CT1 36 (SUTURE) ×4
SUT VIC AB 2-0 CT2 27 (SUTURE) ×2
SYR 20ML LL LF (SYRINGE) ×2
SYR 30ML LL (SYRINGE) ×4
TIBIAL BASE ROT PLAT SZ 5 KNEE (Knees) ×2 IMPLANT
TOWEL OR 17X26 4PK STRL BLUE (TOWEL DISPOSABLE)
TOWER CARTRIDGE SMART MIX (DISPOSABLE) ×2
TRAY FOLEY MTR SLVR 16FR STAT (SET/KITS/TRAYS/PACK) ×2
WATER STERILE IRR 1000ML POUR (IV SOLUTION) ×2
WATER STERILE IRR 500ML POUR (IV SOLUTION)
WRAPON POLAR PAD KNEE (MISCELLANEOUS) ×2

## 2021-10-18 NOTE — Transfer of Care (Signed)
Immediate Anesthesia Transfer of Care Note  Patient: [name]  Procedure(s) Performed: COMPUTER ASSISTED TOTAL KNEE ARTHROPLASTY (Right: Knee)  Patient Location: PACU  Anesthesia Type:Spinal  Level of Consciousness: awake, alert  and oriented  Airway & Oxygen Therapy: Patient Spontanous Breathing and Patient connected to face mask oxygen  Post-op Assessment: Report given to RN and Post -op Vital signs reviewed and stable  Post vital signs: Reviewed and stable  Last Vitals:  Vitals Value Taken Time  BP    Temp    Pulse    Resp    SpO2      Last Pain:  Vitals:   10/18/21 0741  TempSrc: Temporal  PainSc: 3          Complications: No notable events documented.

## 2021-10-18 NOTE — Op Note (Signed)
OPERATIVE NOTE  DATE OF SURGERY:  10/18/2021  PATIENT NAME:  [name]   DOB: [birth year]  MRN: [id number]  PRE-OPERATIVE DIAGNOSIS: Degenerative arthrosis of the right knee, primary  POST-OPERATIVE DIAGNOSIS:  Same  PROCEDURE:  Right total knee arthroplasty using computer-assisted navigation  SURGEON:  Marciano Sequin. M.D.  ASSISTANT: Ike Bene, RNFA (present and scrubbed throughout the case, critical for assistance with exposure, retraction, instrumentation, and closure)  ANESTHESIA: spinal  ESTIMATED BLOOD LOSS: 50 mL  FLUIDS REPLACED: 1000 mL of crystalloid  TOURNIQUET TIME: 79 minutes  DRAINS: 2 medium Hemovac drains  SOFT TISSUE RELEASES: Anterior cruciate ligament, posterior cruciate ligament, deep  medial collateral ligament, patellofemoral ligament  IMPLANTS UTILIZED: DePuy Attune size 5N posterior stabilized femoral component (cemented), size 5 rotating platform tibial component (cemented), 35 mm medialized dome patella (cemented), and a 5 mm stabilized rotating platform polyethylene insert.  INDICATIONS FOR SURGERY: [name] is a 81 y.o. year old female with a long history of progressive knee pain. X-rays demonstrated severe degenerative changes in tricompartmental fashion. The patient had not seen any significant improvement despite conservative nonsurgical intervention. After discussion of the risks and benefits of surgical intervention, the patient expressed understanding of the risks benefits and agree with plans for total knee arthroplasty.   The risks, benefits, and alternatives were discussed at length including but not limited to the risks of infection, bleeding, nerve injury, stiffness, blood clots, the need for revision surgery, cardiopulmonary complications, among others, and they were willing to proceed.  PROCEDURE IN DETAIL: The patient was brought into the operating room and, after adequate spinal anesthesia was achieved, a tourniquet was  placed on the patient's upper thigh. The patient's knee and leg were cleaned and prepped with alcohol and DuraPrep and draped in the usual sterile fashion. A "timeout" was performed as per usual protocol. The lower extremity was exsanguinated using an Esmarch, and the tourniquet was inflated to 300 mmHg. An anterior longitudinal incision was made followed by a standard mid vastus approach. The deep fibers of the medial collateral ligament were elevated in a subperiosteal fashion off of the medial flare of the tibia so as to maintain a continuous soft tissue sleeve. The patella was subluxed laterally and the patellofemoral ligament was incised. Inspection of the knee demonstrated severe degenerative changes with full-thickness loss of articular cartilage. Osteophytes were debrided using a rongeur. Anterior and posterior cruciate ligaments were excised. Two 4.0 mm Schanz pins were inserted in the femur and into the tibia for attachment of the array of trackers used for computer-assisted navigation. Hip center was identified using a circumduction technique. Distal landmarks were mapped using the computer. The distal femur and proximal tibia were mapped using the computer. The distal femoral cutting guide was positioned using computer-assisted navigation so as to achieve a 5 distal valgus cut. The femur was sized and it was felt that a size 5N femoral component was appropriate. A size 5 femoral cutting guide was positioned and the anterior cut was performed and verified using the computer. This was followed by completion of the posterior and chamfer cuts. Femoral cutting guide for the central box was then positioned in the center box cut was performed.  Attention was then directed to the proximal tibia. Medial and lateral menisci were excised. The extramedullary tibial cutting guide was positioned using computer-assisted navigation so as to achieve a 0 varus-valgus alignment and 3 posterior slope. The cut was  performed and verified using the computer. The proximal tibia  was sized and it was felt that a size 5 tibial tray was appropriate. Tibial and femoral trials were inserted followed by insertion of a 5 mm polyethylene insert. This allowed for excellent mediolateral soft tissue balancing both in flexion and in full extension. Finally, the patella was cut and prepared so as to accommodate a 35 mm medialized dome patella. A patella trial was placed and the knee was placed through a range of motion with excellent patellar tracking appreciated. The femoral trial was removed after debridement of posterior osteophytes. The central post-hole for the tibial component was reamed followed by insertion of a keel punch. Tibial trials were then removed. Cut surfaces of bone were irrigated with copious amounts of normal saline using pulsatile lavage and then suctioned dry. Polymethylmethacrylate cement with gentamicin was prepared in the usual fashion using a vacuum mixer. Cement was applied to the cut surface of the proximal tibia as well as along the undersurface of a size 5 rotating platform tibial component. Tibial component was positioned and impacted into place. Excess cement was removed using [employer] fast streamer. Cement was then applied to the cut surfaces of the femur as well as along the posterior flanges of the size 5N femoral component. The femoral component was positioned and impacted into place. Excess cement was removed using [employer] fast streamer. A 5 mm polyethylene trial was inserted and the knee was brought into full extension with steady axial compression applied. Finally, cement was applied to the backside of a 35 mm medialized dome patella and the patellar component was positioned and patellar clamp applied. Excess cement was removed using [employer] fast streamer. After adequate curing of the cement, the tourniquet was deflated after a total tourniquet time of 79 minutes. Hemostasis was achieved using electrocautery. The knee was  irrigated with copious amounts of normal saline using pulsatile lavage followed by 350 ml of Prontosan and then suctioned dry. 20 mL of 1.3% Exparel and 60 mL of 0.25% Marcaine in 40 mL of normal saline was injected along the posterior capsule, medial and lateral gutters, and along the arthrotomy site. A 5 mm stabilized rotating platform polyethylene insert was inserted and the knee was placed through a range of motion with excellent mediolateral soft tissue balancing appreciated and excellent patellar tracking noted. 2 medium drains were placed in the wound bed and brought out through separate stab incisions. The medial parapatellar portion of the incision was reapproximated using interrupted sutures of #1 Vicryl. Subcutaneous tissue was approximated in layers using first #0 Vicryl followed #2-0 Vicryl. The skin was approximated with skin staples. A sterile dressing was applied.  The patient tolerated the procedure well and was transported to the recovery room in stable condition.    Rhandi Despain P. Holley Bouche., M.D.

## 2021-10-18 NOTE — TOC Progression Note (Signed)
Transition of Care [facility]) - Progression Note    Patient Details  Name: [name] MRN: [id number] Date of Birth: March 04, 1941  [facility] CM/SW [place], RN Phone Number: 10/18/2021, 3:09 PM  Clinical Narrative:    Patient has a RW and a 3 in 1 at home, she is set up with [place] for [facility]        Expected Discharge Plan and Services                                                 Social Determinants of Health (SDOH) Interventions    Readmission Risk Interventions No flowsheet data found.

## 2021-10-18 NOTE — Anesthesia Postprocedure Evaluation (Signed)
Anesthesia Post Note  Patient: [name]  Procedure(s) Performed: COMPUTER ASSISTED TOTAL KNEE ARTHROPLASTY (Right: Knee)  Patient location during evaluation: PACU Anesthesia Type: Spinal and General Level of consciousness: awake and alert, oriented and patient cooperative Pain management: pain level controlled Vital Signs Assessment: post-procedure vital signs reviewed and stable Respiratory status: spontaneous breathing, nonlabored ventilation and respiratory function stable Cardiovascular status: blood pressure returned to baseline and stable Postop Assessment: adequate PO intake, no headache and spinal receding Anesthetic complications: no   No notable events documented.   Last Vitals:  Vitals:   10/18/21 1241 10/18/21 1307  BP:  (!) 149/77  Pulse: 100 67  Resp: 18 18  Temp:  (!) 36 C  SpO2: 98% 98%    Last Pain:  Vitals:   10/18/21 1307  TempSrc: Temporal  PainSc: [place]

## 2021-10-18 NOTE — Progress Notes (Signed)
Bladder scan performed. 276 ml. No indication for in and out cath at this time.

## 2021-10-18 NOTE — Anesthesia Preprocedure Evaluation (Addendum)
Anesthesia Evaluation  Patient identified by MRN, date of birth, ID band Patient awake    Reviewed: Allergy & Precautions, NPO status , Patient's Chart, lab work & pertinent test results  History of Anesthesia Complications Negative for: history of anesthetic complications  Airway Mallampati: III   Neck ROM: Full    Dental no notable dental hx.    Pulmonary former smoker (quit 1972),    Pulmonary exam normal breath sounds clear to auscultation       Cardiovascular hypertension, Normal cardiovascular exam Rhythm:Regular Rate:Normal  ECG 10/05/21:  Sinus bradycardia (HR 55) Otherwise normal ECG When compared with ECG of 25-Nov-2019 12:27, No significant change was found   Neuro/Psych PSYCHIATRIC DISORDERS Anxiety negative neurological ROS     GI/Hepatic GERD  ,  Endo/Other  diabetes, Type 2Hypothyroidism   Renal/GU Renal disease (nephrolithiasis)     Musculoskeletal  (+) Arthritis ,   Abdominal   Peds  Hematology negative hematology ROS (+)   Anesthesia Other Findings   Reproductive/Obstetrics                            Anesthesia Physical Anesthesia Plan  ASA: 2  Anesthesia Plan: General and Spinal   Post-op Pain Management:    Induction: Intravenous  PONV Risk Score and Plan: 3 and Propofol infusion, TIVA, Treatment may vary due to age or medical condition and Ondansetron  Airway Management Planned: Natural Airway and Nasal Cannula  Additional Equipment:   Intra-op Plan:   Post-operative Plan:   Informed Consent: I have reviewed the patients History and Physical, chart, labs and discussed the procedure including the risks, benefits and alternatives for the proposed anesthesia with the patient or authorized representative who has indicated his/her understanding and acceptance.       Plan Discussed with: CRNA  Anesthesia Plan Comments: (Plan for spinal and GA with  natural airway, LMA/GETA backup.  Patient consented for risks of anesthesia including but not limited to:  - adverse reactions to medications - damage to eyes, teeth, lips or other oral mucosa - nerve damage due to positioning  - sore throat or hoarseness - headache, bleeding, infection, nerve damage 2/2 spinal - damage to heart, brain, nerves, lungs, other parts of body or loss of life  Informed patient about role of CRNA in peri- and intra-operative care.  Patient voiced understanding.)        Anesthesia Quick Evaluation

## 2021-10-18 NOTE — H&P (Signed)
The patient has been re-examined, and the chart reviewed, and there have been no interval changes to the documented history and physical.    The risks, benefits, and alternatives have been discussed at length. The patient expressed understanding of the risks benefits and agreed with plans for surgical intervention.  Wadell Craddock P. Katalyn Matin, Jr. M.D.    

## 2021-10-18 NOTE — Evaluation (Signed)
Physical Therapy Evaluation Patient Details Name: [name] MRN: [id number] DOB: May 24, 1941 Today's Date: 10/18/2021  History of Present Illness  Pt is an 81 yo F diagnosed with degenerative arthrosis of the right knee and is s/p elective R TKA. PMH includes: hypothyroidism, HTN, DM, anxiety, and GERD.   Clinical Impression  Pt was pleasant and motivated to participate during the session and put forth good effort throughout.  Pt required no physical assistance with any functional task and demonstrated good control and stability with transfers and gait.  Pt was able to amb 25 feet to the bathroom with no adverse symptoms and with SpO2 and HR WNL on room air.  Pt is expected to make very good progress while in acute care and will benefit from HHPT upon discharge to safely address deficits listed in patient problem list for decreased caregiver assistance and eventual return to PLOF.         Recommendations for follow up therapy are one component of a multi-disciplinary discharge planning process, led by the attending physician.  Recommendations may be updated based on patient status, additional functional criteria and insurance authorization.  Follow Up Recommendations Home health PT    Assistance Recommended at Discharge Frequent or constant Supervision/Assistance  Patient can return home with the following  A little help with walking and/or transfers;A little help with bathing/dressing/bathroom;Assist for transportation;Help with stairs or ramp for entrance;Assistance with cooking/housework    Equipment Recommendations BSC/3in1  Recommendations for Other Services       Functional Status Assessment Patient has had a recent decline in their functional status and demonstrates the ability to make significant improvements in function in a reasonable and predictable amount of time.     Precautions / Restrictions Precautions Precautions: Fall Restrictions Weight Bearing Restrictions:  Yes RLE Weight Bearing: Weight bearing as tolerated Other Position/Activity Restrictions: pt able to perform Ind RLE SLR without extensor lag, no KI required      Mobility  Bed Mobility Overal bed mobility: Modified Independent             General bed mobility comments: Min extra time and effort and use of bed rail only    Transfers Overall transfer level: Needs assistance Equipment used: Rolling walker (2 wheels) Transfers: Sit to/from Stand Sit to Stand: Min guard           General transfer comment: Min verbal cues for sequencing with good eccentric and concentric control    Ambulation/Gait Ambulation/Gait assistance: Min guard Gait Distance (Feet): 25 Feet Assistive device: Rolling walker (2 wheels) Gait Pattern/deviations: Step-through pattern, Decreased stance time - right, Step-to pattern Gait velocity: decreased     General Gait Details: Pt took several small step-to steps before transitioning to step-through pattern with good control and stability throughout  Stairs            Wheelchair Mobility    Modified Rankin (Stroke Patients Only)       Balance Overall balance assessment: Needs assistance   Sitting balance-Leahy Scale: Good     Standing balance support: Bilateral upper extremity supported, During functional activity Standing balance-Leahy Scale: Good                               Pertinent Vitals/Pain Pain Assessment Pain Assessment: 0-10 Pain Score: 2  Pain Location: R knee Pain Descriptors / Indicators: Sore Pain Intervention(s): Repositioned, Premedicated before session, Monitored during session, Ice applied    Home  Living Family/patient expects to be discharged to:: Private residence Living Arrangements: Children;Other relatives Available Help at Discharge: Family;Available 24 hours/day Type of Home: House Home Access: Stairs to enter Entrance Stairs-Rails: None Entrance Stairs-Number of Steps: 2   Home  Layout: One level Home Equipment: Conservation officer, nature (2 wheels);Hand held shower head Additional Comments: Daughter and adult grandaughter live with patient, 24/7 supervision available    Prior Function Prior Level of Function : Independent/Modified Independent             Mobility Comments: Ind amb without AD community distances, no fall history, works in the yard including mowing with a Chiropractor ADLs Comments: Ind with ADLs     Hand Dominance        Extremity/Trunk Assessment   Upper Extremity Assessment Upper Extremity Assessment: Overall WFL for tasks assessed    Lower Extremity Assessment Lower Extremity Assessment: RLE deficits/detail RLE Deficits / Details: BLE ankle AROM, strength, and sensation to light touch grossly WNL; pt able to perform Ind RLE SLR without extensor lag, no KI required RLE: Unable to fully assess due to pain RLE Sensation: WNL       Communication   Communication: No difficulties  Cognition Arousal/Alertness: Awake/alert Behavior During Therapy: WFL for tasks assessed/performed Overall Cognitive Status: Within Functional Limits for tasks assessed                                          General Comments      Exercises Total Joint Exercises Ankle Circles/Pumps: AROM, Strengthening, Both, 10 reps Quad Sets: AROM, Strengthening, Right, Both, 10 reps, 5 reps Straight Leg Raises: AROM, Right, Strengthening, 5 reps Long Arc Quad: AROM, Strengthening, Right, 10 reps, 5 reps Knee Flexion: AROM, Strengthening, Right, 5 reps, 10 reps Goniometric ROM: R knee AROM grossly 0-75 deg Marching in Standing: AROM, Strengthening, Both, 5 reps, Standing Other Exercises Other Exercises: HEP education per handout   Assessment/Plan    PT Assessment Patient needs continued PT services  PT Problem List Decreased strength;Decreased range of motion;Decreased activity tolerance;Decreased balance;Decreased mobility;Decreased knowledge of use of  DME;Pain       PT Treatment Interventions DME instruction;Gait training;Stair training;Functional mobility training;Therapeutic activities;Therapeutic exercise;Balance training;Patient/family education    PT Goals (Current goals can be found in the Care Plan section)  Acute Rehab PT Goals Patient Stated Goal: To be independent again PT Goal Formulation: With patient Time For Goal Achievement: 10/31/21 Potential to Achieve Goals: Good    Frequency BID     Co-evaluation               AM-PAC PT "6 Clicks" Mobility  Outcome Measure Help needed turning from your back to your side while in a flat bed without using bedrails?: A Little Help needed moving from lying on your back to sitting on the side of a flat bed without using bedrails?: A Little Help needed moving to and from a bed to a chair (including a wheelchair)?: A Little Help needed standing up from a chair using your arms (e.g., wheelchair or bedside chair)?: A Little Help needed to walk in hospital room?: A Little Help needed climbing 3-5 steps with a railing? : A Lot 6 Click Score: 17    End of Session Equipment Utilized During Treatment: Gait belt Activity Tolerance: Patient tolerated treatment well Patient left: Other (comment) (Pt left in bathroom with nursing) Nurse Communication: Mobility  status;Weight bearing status PT Visit Diagnosis: Other abnormalities of gait and mobility (R26.89);Muscle weakness (generalized) (M62.81);Pain Pain - Right/Left: Right Pain - part of body: Knee    Time: 5041-3643 PT Time Calculation (min) (ACUTE ONLY): 45 min   Charges:   PT Evaluation $PT Eval Moderate Complexity: 1 Mod PT Treatments $Gait Training: 8-22 mins $Therapeutic Exercise: 8-22 mins        D. Royetta Asal PT, DPT 10/18/21, 5:31 PM

## 2021-10-18 NOTE — Anesthesia Procedure Notes (Signed)
Spinal  Patient location during procedure: OR Start time: 10/18/2021 8:30 AM End time: 10/18/2021 8:34 AM Reason for block: surgical anesthesia Staffing Performed: resident/CRNA  Anesthesiologist: Darrin Nipper, MD Resident/CRNA: Nelda Marseille, CRNA Other anesthesia staff: Bea Graff, RN Preanesthetic Checklist Completed: patient identified, IV checked, site marked, risks and benefits discussed, surgical consent, monitors and equipment checked, pre-op evaluation and timeout performed Spinal Block Patient position: sitting Prep: Betadine Patient monitoring: heart rate, continuous pulse ox, blood pressure and cardiac monitor Approach: midline Location: L3-4 Injection technique: single-shot Needle Needle type: Introducer and Quincke  Needle gauge: 22 G Needle length: 9 cm Assessment Events: CSF return Additional Notes Negative paresthesia. Negative blood return. Positive free-flowing CSF. Expiration date of kit checked and confirmed. Patient tolerated procedure well, without complications.

## 2021-10-19 DIAGNOSIS — Z87891 Personal history of nicotine dependence: Secondary | ICD-10-CM | POA: Diagnosis not present

## 2021-10-19 DIAGNOSIS — Z79899 Other long term (current) drug therapy: Secondary | ICD-10-CM | POA: Diagnosis not present

## 2021-10-19 DIAGNOSIS — I10 Essential (primary) hypertension: Secondary | ICD-10-CM | POA: Diagnosis not present

## 2021-10-19 DIAGNOSIS — E039 Hypothyroidism, unspecified: Secondary | ICD-10-CM | POA: Diagnosis not present

## 2021-10-19 DIAGNOSIS — Z91040 Latex allergy status: Secondary | ICD-10-CM | POA: Diagnosis not present

## 2021-10-19 DIAGNOSIS — M1711 Unilateral primary osteoarthritis, right knee: Secondary | ICD-10-CM | POA: Diagnosis not present

## 2021-10-19 DIAGNOSIS — E119 Type 2 diabetes mellitus without complications: Secondary | ICD-10-CM | POA: Diagnosis not present

## 2021-10-19 DIAGNOSIS — Z7984 Long term (current) use of oral hypoglycemic drugs: Secondary | ICD-10-CM | POA: Diagnosis not present

## 2021-10-19 LAB — GLUCOSE, CAPILLARY
Glucose-Capillary: 154 mg/dL — ABNORMAL HIGH (ref 70–99)
Glucose-Capillary: 122 mg/dL — ABNORMAL HIGH (ref 70–99)

## 2021-10-19 MED ORDER — ACETAMINOPHEN 10 MG/ML IV SOLN
INTRAVENOUS | Status: AC
  Administered 2021-10-19: 1000 mg via INTRAVENOUS
  Filled 2021-10-19: qty 100

## 2021-10-19 MED ORDER — CELECOXIB 200 MG PO CAPS
ORAL_CAPSULE | ORAL | Status: AC
  Administered 2021-10-19: 200 mg via ORAL
  Filled 2021-10-19: qty 1

## 2021-10-19 MED ORDER — METOCLOPRAMIDE HCL 10 MG PO TABS
ORAL_TABLET | ORAL | Status: AC
  Administered 2021-10-19: 10 mg via ORAL
  Filled 2021-10-19: qty 1

## 2021-10-19 MED ORDER — MAGNESIUM HYDROXIDE 400 MG/5ML PO SUSP
ORAL | Status: AC
  Administered 2021-10-19: 30 mL via ORAL
  Filled 2021-10-19: qty 30

## 2021-10-19 MED ORDER — GABAPENTIN 100 MG PO CAPS
ORAL_CAPSULE | ORAL | Status: AC
  Administered 2021-10-19: 100 mg via ORAL
  Filled 2021-10-19: qty 1

## 2021-10-19 MED ORDER — OXYCODONE HCL 5 MG PO TABS: 5.0000 mg | ORAL_TABLET | ORAL | 0 refills | Status: DC | PRN

## 2021-10-19 MED ORDER — INSULIN ASPART 100 UNIT/ML IJ SOLN
INTRAMUSCULAR | Status: AC
  Administered 2021-10-19: 2 [IU] via SUBCUTANEOUS
  Filled 2021-10-19: qty 1

## 2021-10-19 MED ORDER — TRAMADOL HCL 50 MG PO TABS: 50.0000 mg | ORAL_TABLET | ORAL | 0 refills | Status: DC | PRN

## 2021-10-19 MED ORDER — ENOXAPARIN SODIUM 40 MG/0.4ML IJ SOSY: 40.0000 mg | PREFILLED_SYRINGE | INTRAMUSCULAR | 0 refills | Status: DC

## 2021-10-19 MED ORDER — CELECOXIB 200 MG PO CAPS: 200.0000 mg | ORAL_CAPSULE | Freq: Two times a day (BID) | ORAL | 0 refills | Status: DC

## 2021-10-19 MED ORDER — INSULIN ASPART 100 UNIT/ML IJ SOLN
INTRAMUSCULAR | Status: AC
  Filled 2021-10-19: qty 1

## 2021-10-19 MED ORDER — TRAMADOL HCL 50 MG PO TABS
ORAL_TABLET | ORAL | Status: AC
  Filled 2021-10-19: qty 1

## 2021-10-19 NOTE — Progress Notes (Signed)
°  Subjective: 1 Day Post-Op Procedure(s) (LRB): COMPUTER ASSISTED TOTAL KNEE ARTHROPLASTY (Right) Patient reports pain as well-controlled.   Patient is well, and has had no acute complaints or problems Plan is to go Home after hospital stay. Negative for chest pain and shortness of breath Fever: no Gastrointestinal: negative for nausea and vomiting.  Patient has not had a bowel movement.  Objective: Vital signs in last 24 hours: Temp:  [96.8 F (36 C)-97.6 F (36.4 C)] 96.8 F (36 C) (02/16 0400) Pulse Rate:  [57-100] 72 (02/16 0400) Resp:  [10-20] 17 (02/16 0400) BP: (103-149)/(53-77) 103/53 (02/16 0400) SpO2:  [88 %-99 %] 94 % (02/16 0400)  Intake/Output from previous day:  Intake/Output Summary (Last 24 hours) at 10/19/2021 0810 Last data filed at 10/19/2021 0620 Gross per 24 hour  Intake 1250 ml  Output 1190 ml  Net 60 ml    Intake/Output this shift: No intake/output data recorded.  Labs: No results for input(s): HGB in the last 72 hours. No results for input(s): WBC, RBC, HCT, PLT in the last 72 hours. No results for input(s): NA, K, CL, CO2, BUN, CREATININE, GLUCOSE, CALCIUM in the last 72 hours. No results for input(s): LABPT, INR in the last 72 hours.   EXAM General - Patient is Alert, Appropriate, and Oriented Extremity - Neurovascular intact Dorsiflexion/Plantar flexion intact Compartment soft Dressing/Incision -Postoperative dressing remains in place., Polar Care in place and working. , Hemovac in place. , Following removal of post-op dressing, mild bloody drainage noted Motor Function - intact, moving foot and toes well on exam. Able to perform independent SLR.  Cardiovascular- Regular rate and rhythm, no murmurs/rubs/gallops Respiratory- Lungs clear to auscultation bilaterally Gastrointestinal- soft, nontender, and active bowel sounds   Assessment/Plan: 1 Day Post-Op Procedure(s) (LRB): COMPUTER ASSISTED TOTAL KNEE ARTHROPLASTY (Right) Principal  Problem:   Total knee replacement status  Estimated body mass index is 28.27 kg/m as calculated from the following:   Height as of this encounter: 5\' 3"  (1.6 m).   Weight as of this encounter: 72.4 kg. Advance diet Up with therapy  Likely d/c to home this PM  Post-op dressing removed. , Hemovac removed., Mini compression dressing applied. , and Fresh honeycomb dressing applied.   DVT Prophylaxis - Lovenox, Ted hose, and SCDs Weight-Bearing as tolerated to right leg  Cassell Smiles, PA-C [facility] 10/19/2021, 8:10 AM

## 2021-10-19 NOTE — Progress Notes (Signed)
Physical Therapy Treatment Patient Details Name: [name] MRN: [id number] DOB: [birth year] Today's Date: 10/19/2021   History of Present Illness Pt is an 81 yo F diagnosed with degenerative arthrosis of the right knee and is s/p elective R TKA. PMH includes: hypothyroidism, HTN, DM, anxiety, and GERD.    PT Comments    Pt stands and walks x 2 on unit with ease.  Seated AROM with only slight pulling quads with 90 degrees limited by chair.  Reviewed HEP.  Awaiting discharge home.  Recommendations for follow up therapy are one component of a multi-disciplinary discharge planning process, led by the attending physician.  Recommendations may be updated based on patient status, additional functional criteria and insurance authorization.  Follow Up Recommendations  Home health PT     Assistance Recommended at Discharge Frequent or constant Supervision/Assistance  Patient can return home with the following A little help with walking and/or transfers;A little help with bathing/dressing/bathroom;Assist for transportation;Help with stairs or ramp for entrance;Assistance with cooking/housework   Equipment Recommendations  BSC/3in1    Recommendations for Other Services       Precautions / Restrictions Precautions Precautions: Fall Restrictions Weight Bearing Restrictions: Yes RLE Weight Bearing: Weight bearing as tolerated Other Position/Activity Restrictions: pt able to perform Ind RLE SLR without extensor lag, no KI required     Mobility  Bed Mobility               General bed mobility comments: NT. up in recliner    Transfers   Equipment used: Rolling walker (2 wheels) Transfers: Sit to/from Stand Sit to Stand: Supervision                Ambulation/Gait   Gait Distance (Feet): 300 Feet Assistive device: Rolling walker (2 wheels) Gait Pattern/deviations: Step-through pattern       General Gait Details: no LOB and good safety   Stairs              Wheelchair Mobility    Modified Rankin (Stroke Patients Only)       Balance Overall balance assessment: Needs assistance Sitting-balance support: Feet supported, No upper extremity supported Sitting balance-Leahy Scale: Good     Standing balance support: Bilateral upper extremity supported, During functional activity Standing balance-Leahy Scale: Good                              Cognition Arousal/Alertness: Awake/alert Behavior During Therapy: WFL for tasks assessed/performed Overall Cognitive Status: Within Functional Limits for tasks assessed                                          Exercises      General Comments        Pertinent Vitals/Pain Pain Assessment Pain Assessment: No/denies pain    Home Living                          Prior Function            PT Goals (current goals can now be found in the care plan section) Progress towards PT goals: Progressing toward goals    Frequency    BID      PT Plan Current plan remains appropriate    Co-evaluation  AM-PAC PT "6 Clicks" Mobility   Outcome Measure  Help needed turning from your back to your side while in a flat bed without using bedrails?: A Little Help needed moving from lying on your back to sitting on the side of a flat bed without using bedrails?: A Little Help needed moving to and from a bed to a chair (including a wheelchair)?: None Help needed standing up from a chair using your arms (e.g., wheelchair or bedside chair)?: None Help needed to walk in hospital room?: A Little Help needed climbing 3-5 steps with a railing? : A Little 6 Click Score: 20    End of Session   Activity Tolerance: Patient tolerated treatment well Patient left: in chair;with call bell/phone within reach;with chair alarm set Nurse Communication: Mobility status;Weight bearing status PT Visit Diagnosis: Other abnormalities of gait and mobility  (R26.89);Muscle weakness (generalized) (M62.81);Pain Pain - Right/Left: Right Pain - part of body: Knee     Time: 0334-0400 PT Time Calculation (min) (ACUTE ONLY): 26 min  Charges:  $Gait Training: 23-37 mins                    Chesley Noon, PTA 10/19/21, 4:04 PM

## 2021-10-19 NOTE — Evaluation (Signed)
Occupational Therapy Evaluation Patient Details Name: [name] MRN: [id number] DOB: 1941-04-26 Today's Date: 10/19/2021   History of Present Illness Pt is an 81 yo F diagnosed with degenerative arthrosis of the right knee and is s/p elective R TKA. PMH includes: hypothyroidism, HTN, DM, anxiety, and GERD.   Clinical Impression   Pt seen for OT evaluation this date, POD#1 from above surgery. Pt was independent in all ADL prior to surgery and enjoyed mowing her yard and being outside but has been increasingly limited due to R knee pain. Pt is eager to return to PLOF with less pain and improved safety and independence. Pt currently requires minimal assist for LB dressing and bathing as well as Supv-CGA for ADL transfers +PRN VC for RW mgt due to pain and limited AROM of R knee. Pt instructed in polar care mgt, falls prevention strategies, home/routines modifications, DME/AE for LB bathing and dressing tasks, and compression stocking mgt. Handout provided to support recall and carryover. Pt would benefit from skilled OT services including additional instruction in dressing techniques with or without assistive devices for dressing and bathing skills to support recall and carryover prior to discharge and ultimately to maximize safety, independence, and minimize falls risk and caregiver burden. Do not currently anticipate any OT needs following this hospitalization.      Recommendations for follow up therapy are one component of a multi-disciplinary discharge planning process, led by the attending physician.  Recommendations may be updated based on patient status, additional functional criteria and insurance authorization.   Follow Up Recommendations  No OT follow up    Assistance Recommended at Discharge PRN  Patient can return home with the following A little help with walking and/or transfers;A little help with bathing/dressing/bathroom;Assistance with cooking/housework;Assist for  transportation;Help with stairs or ramp for entrance    Functional Status Assessment  Patient has had a recent decline in their functional status and demonstrates the ability to make significant improvements in function in a reasonable and predictable amount of time.  Equipment Recommendations  Tub/shower bench    Recommendations for Other Services       Precautions / Restrictions Precautions Precautions: Fall Restrictions Weight Bearing Restrictions: Yes RLE Weight Bearing: Weight bearing as tolerated Other Position/Activity Restrictions: pt able to perform Ind RLE SLR without extensor lag, no KI required      Mobility Bed Mobility               General bed mobility comments: NT. up in recliner    Transfers Overall transfer level: Needs assistance Equipment used: Rolling walker (2 wheels) Transfers: Sit to/from Stand Sit to Stand: Supervision                  Balance Overall balance assessment: Needs assistance Sitting-balance support: Feet supported, No upper extremity supported Sitting balance-Leahy Scale: Good     Standing balance support: Bilateral upper extremity supported, During functional activity Standing balance-Leahy Scale: Good                             ADL either performed or assessed with clinical judgement   ADL Overall ADL's : Needs assistance/impaired                                       General ADL Comments: Pt currently requires MIN A for LB ADL tasks 2/2 decr  strength/ROM in R knee. Pt requires sup-CGA for ADL transfers + RW + PRN VC for RW mgt     Vision         Perception     Praxis      Pertinent Vitals/Pain Pain Assessment Pain Assessment: No/denies pain     Hand Dominance Left   Extremity/Trunk Assessment Upper Extremity Assessment Upper Extremity Assessment: Overall WFL for tasks assessed   Lower Extremity Assessment Lower Extremity Assessment: RLE deficits/detail RLE  Deficits / Details: expected post-op strength/ROM deficits       Communication Communication Communication: No difficulties   Cognition Arousal/Alertness: Awake/alert Behavior During Therapy: WFL for tasks assessed/performed Overall Cognitive Status: Within Functional Limits for tasks assessed                                       General Comments      Exercises Other Exercises Other Exercises: Pt instructed in home/routines modifications, falls prevention, AE/DME for ADL including reacher for LB dressing and TTB for seated showers once cleared by MD for bathing, compression stocking mgt, and polar care mgt. Handout provided   Shoulder Instructions      Home Living Family/patient expects to be discharged to:: Private residence Living Arrangements: Children;Other relatives Available Help at Discharge: Family;Available 24 hours/day Type of Home: House Home Access: Stairs to enter [employer] of Steps: 2 Entrance Stairs-Rails: None Home Layout: One level     Bathroom Shower/Tub: Tub/shower unit;Walk-in shower (normally gets down in the tub for a bath)   Bathroom Toilet: Handicapped height     Home Equipment: Conservation officer, nature (2 wheels);Hand held shower head   Additional Comments: Daughter and adult grandaughter live with patient, 24/7 supervision available      Prior Functioning/Environment Prior Level of Function : Independent/Modified Independent             Mobility Comments: Ind amb without AD community distances, no fall history, works in the yard including mowing with a Chiropractor ADLs Comments: Ind with ADLs        OT Problem List: Decreased strength;Decreased range of motion;Decreased knowledge of use of DME or AE;Impaired balance (sitting and/or standing)      OT Treatment/Interventions: Self-care/ADL training;Therapeutic exercise;Therapeutic activities;DME and/or AE instruction;Patient/family education;Balance training    OT  Goals(Current goals can be found in the care plan section) Acute Rehab OT Goals Patient Stated Goal: go home OT Goal Formulation: With patient Time For Goal Achievement: 11/02/21 Potential to Achieve Goals: Good ADL Goals Pt Will Perform Lower Body Dressing: with modified independence;sit to/from stand Pt Will Transfer to Toilet: with modified independence;ambulating (LRAD) Additional ADL Goal #1: Pt will independently instruct family/caregiver in polar care mgt Additional ADL Goal #2: Pt will independently instruct family/caregiver in compression stocking mgt  OT Frequency: Min 2X/week    Co-evaluation              AM-PAC OT "6 Clicks" Daily Activity     Outcome Measure Help from another person eating meals?: None Help from another person taking care of personal grooming?: None Help from another person toileting, which includes using toliet, bedpan, or urinal?: A Little Help from another person bathing (including washing, rinsing, drying)?: A Little Help from another person to put on and taking off regular upper body clothing?: None Help from another person to put on and taking off regular lower body clothing?: A Little 6 Click  Score: 21   End of Session    Activity Tolerance: Patient tolerated treatment well Patient left: in chair;with call bell/phone within reach  OT Visit Diagnosis: Other abnormalities of gait and mobility (R26.89)                Time: 4383-7793 OT Time Calculation (min): 30 min Charges:  OT General Charges $OT Visit: 1 Visit OT Evaluation $OT Eval Moderate Complexity: 1 Mod OT Treatments $Self Care/Home Management : 23-37 mins  Ardeth Perfect., MPH, MS, OTR/L ascom [phone] 10/19/21, 10:09 AM

## 2021-10-19 NOTE — Discharge Summary (Signed)
Physician Discharge Summary  Patient ID: [name] MRN: [id number] DOB/AGE: 1941-04-12 81 y.o.  Admit date: 10/18/2021 Discharge date: 10/19/2021  Admission Diagnoses:  Total knee replacement status [Z96.659]  Surgeries:Procedure(s): Right total knee arthroplasty using computer-assisted navigation   SURGEON:  Marciano Sequin. M.D.   ASSISTANT: Ike Bene, RNFA (present and scrubbed throughout the case, critical for assistance with exposure, retraction, instrumentation, and closure)   ANESTHESIA: spinal   ESTIMATED BLOOD LOSS: 50 mL   FLUIDS REPLACED: 1000 mL of crystalloid   TOURNIQUET TIME: 79 minutes   DRAINS: 2 medium Hemovac drains   SOFT TISSUE RELEASES: Anterior cruciate ligament, posterior cruciate ligament, deep  medial collateral ligament, patellofemoral ligament   IMPLANTS UTILIZED: DePuy Attune size 5N posterior stabilized femoral component (cemented), size 5 rotating platform tibial component (cemented), 35 mm medialized dome patella (cemented), and a 5 mm stabilized rotating platform polyethylene insert.  Discharge Diagnoses: Patient Active Problem List   Diagnosis Date Noted   Total knee replacement status 10/18/2021   Primary osteoarthritis of right knee 07/23/2021   Bilateral hearing loss due to cerumen impaction 02/22/2021   Thrombocytopenia ([place]) 10/19/2020   Aortic arch atherosclerosis ([place]) 04/14/2020   History of nephrolithiasis 11/03/2019   Hallux abductovalgus with bunions, right 11/23/2017   Tubular adenoma of colon 10/11/2017   Eczema of hand 05/11/2013   Overweight (BMI 25.0-29.9) 03/26/2013   Screening for cervical cancer 11/19/2011   Routine general medical examination at a health care facility 05/02/2011   DM type 2, controlled, with complication ([place]) 78/29/5621   Hypothyroidism 02/06/2007   Hyperlipidemia associated with type 2 diabetes mellitus ([place]) 02/06/2007   Essential hypertension 02/06/2007   GERD 02/06/2007    Osteoporosis, 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 female who was admitted 10/18/2021 with a diagnosis of right knee osteoarthritis and went to the operating room on 10/18/2021 and underwent right total knee arthroplasty. The patient received perioperative antibiotics for prophylaxis (see below). The patient tolerated the procedure well and was transported to PACU in stable condition. After meeting PACU criteria, the patient was subsequently transferred to the Orthopaedics/Rehabilitation unit.   The patient received DVT prophylaxis in the form of early mobilization, Lovenox, TED hose, and SCDs . A sacral pad had been placed and heels were elevated off of the bed with rolled towels in order to protect skin integrity. Foley catheter was discontinued on postoperative day #0. Wound drains were discontinued on postoperative day #1. The surgical incision was healing well without signs of infection.  Physical therapy was initiated postoperatively for transfers, gait training, and strengthening. Occupational therapy was initiated for activities of daily living and evaluation for assisted devices. Rehabilitation goals were reviewed in detail with the patient. The patient made steady progress with physical therapy and physical therapy recommended discharge to Home.   The patient achieved the preliminary goals of this hospitalization and was felt to be medically and orthopaedically appropriate for discharge.  She was given perioperative antibiotics:  Anti-infectives (From admission, onward)  Start     Dose/Rate Route Frequency Ordered Stop   10/18/21 1400  ceFAZolin (ANCEF) IVPB 2g/100 mL premix        2 g 200 mL/hr over 30 Minutes Intravenous Every 6 hours 10/18/21 1301 10/18/21 2030   10/18/21 0600  ceFAZolin (ANCEF) IVPB 2g/100 mL premix  Status:  Discontinued        2 g 200 mL/hr over 30 Minutes Intravenous On call to O.R. 10/18/21 0127 10/18/21 0744     .  Recent vital signs:  Vitals:   10/19/21 0400 10/19/21 0852  BP: (!) 103/53 (!) 101/55  Pulse: 72 (!) 57  Resp: 17 18  Temp: (!) 96.8 F (36 C) (!) 97.1 F (36.2 C)  SpO2: 94% 100%    Recent laboratory studies:  No results for input(s): WBC, HGB, HCT, PLT, K, CL, CO2, BUN, CREATININE, GLUCOSE, CALCIUM, LABPT, INR in the last 72 hours.  Diagnostic Studies: DG Knee Right Port  Result Date: 10/18/2021 CLINICAL DATA:  Postop knee replacement. EXAM: PORTABLE RIGHT KNEE - 1-2 VIEW COMPARISON:  Insert 9 FINDINGS: Two views study shows tricompartmental knee replacement. Surgical drains overlie the anterior knee. Gas in the soft tissues and joint space is compatible with the immediate postoperative state. IMPRESSION: Status post tricompartmental knee replacement without evidence for immediate hardware complication. Electronically Signed   By: Misty Stanley M.D.   On: 10/18/2021 12:32    Discharge Medications:   Allergies as of 10/19/2021       Reactions   Latex Rash   IgE < 0.10 (WNL) on 10/05/2021   Nickel Rash        Medication List     TAKE these medications    acetaminophen 650 MG CR tablet Commonly known as: TYLENOL Take 1,300 mg by mouth 2 (two) times daily as needed (knee pain.).   ALPRAZolam 0.25 MG tablet Commonly known as: XANAX Take 0.5 tablets (0.125 mg total) by mouth at bedtime as needed for anxiety.   atorvastatin 20 MG tablet Commonly known as: LIPITOR TAKE 1 TABLET BY MOUTH EVERY DAY   BIOFREEZE EX Apply 1 application topically 3 (three) times daily as needed (pain.).   celecoxib  200 MG capsule Commonly known as: CELEBREX Take 1 capsule (200 mg total) by mouth 2 (two) times daily.   cholecalciferol 25 MCG (1000 UNIT) tablet Commonly known as: VITAMIN D Take 2 tablets (2,000 Units total) by mouth daily. What changed:  how much to take when to take this   enoxaparin 40 MG/0.4ML injection Commonly known as: LOVENOX Inject 0.4 mLs (40 mg total) into the skin daily for 14 days.   gabapentin 100 MG capsule Commonly known as: NEURONTIN Take 1 capsule (100 mg total) by mouth 3 (three) times daily.   glipiZIDE 5 MG tablet Commonly known as: GLUCOTROL 1/2 tablet at breakfast,  and 2 tablets at dinnertime What changed:  how much to take how to take this when to take this additional instructions   levothyroxine 88 MCG tablet Commonly known as: SYNTHROID TAKE 1 TABLET BY MOUTH EVERY DAY ON EMPTY STOMACH WITH WATER AT LEAST 30-60 MINS BEFORE BREAKFAST   LINIMENTS EX Apply 1 application topically 2 (two) times daily as needed (pain/inflammation.). Absorbine Veterinary Liniment Gel   losartan 50 MG tablet Commonly known as: COZAAR TAKE 1 TABLET BY MOUTH EVERY DAY What changed: when to take this   metFORMIN 500 MG 24 hr tablet Commonly known as: GLUCOPHAGE-XR TAKE 1 TABLET BY MOUTH EVERY DAY WITH  BREAKFAST   ONE TOUCH ULTRA 2 w/Device Kit Use to check blood sugars once daily.   OneTouch Ultra test strip Generic drug: glucose blood USE AS DIRECTED   oxyCODONE 5 MG immediate release tablet Commonly known as: Oxy IR/ROXICODONE Take 1 tablet (5 mg total) by mouth every 4 (four) hours as needed for severe pain.   pioglitazone 15 MG tablet Commonly known as: ACTOS TAKE 1 TABLET (15 MG TOTAL) BY MOUTH DAILY WITH SUPPER.   traMADol 50 MG tablet Commonly known as: ULTRAM Take 1 tablet (50 mg total) by mouth every 4 (four) hours as needed for moderate pain.               Durable Medical Equipment  (From admission, onward)           Start      Ordered   10/18/21 1301  DME Walker rolling  Once       Question:  Patient needs a walker to treat with the following condition  Answer:  Total knee replacement status   10/18/21 1301   10/18/21 1301  DME Bedside commode  Once       Question:  Patient needs a bedside commode to treat with the following condition  Answer:  Total knee replacement status   10/18/21 1301            Disposition: Home with home health PT     Follow-up Information     Fausto Skillern, PA-C Follow up on 11/02/2021.   Specialty: Orthopedic Surgery Why: at 8:45am Contact information: [place] [address] [phone]         Dereck Leep, MD Follow up on 11/30/2021.   Specialty: Orthopedic Surgery Why: at 9:15am Contact information: [place]  [id number] [place], PA-C 10/19/2021, 12:41 PM

## 2021-10-19 NOTE — Progress Notes (Signed)
Physical Therapy Treatment Patient Details Name: [name] MRN: [id number] DOB: 30-Sep-1940 Today's Date: 10/19/2021   History of Present Illness Pt is an 81 yo F diagnosed with degenerative arthrosis of the right knee and is s/p elective R TKA. PMH includes: hypothyroidism, HTN, DM, anxiety, and GERD.    PT Comments    Pt was long sitting in bed upon arriving. She is A and O x 4 and agreeable to PT session. MD and PA rounded on pt during session. She was easily and safely able to exit R side of bed, stand to RW, and ambulate to BR. Successfully urinated in toilet prior to standing and ambulated an additional 200 + ft with supervision only. She safely ascending/descended stairs 2 x without physical assistance. Pt is progressing extremely well. Will return later this afternoon for PM/BID session. Pt will benefit from HHPT at DC to address deficits while assisting pt to PLOF. Will address ROM, strength, and HEP this afternoon.    Recommendations for follow up therapy are one component of a multi-disciplinary discharge planning process, led by the attending physician.  Recommendations may be updated based on patient status, additional functional criteria and insurance authorization.  Follow Up Recommendations  Home health PT     Assistance Recommended at Discharge Frequent or constant Supervision/Assistance  Patient can return home with the following A little help with walking and/or transfers;A little help with bathing/dressing/bathroom;Assist for transportation;Help with stairs or ramp for entrance;Assistance with cooking/housework   Equipment Recommendations  BSC/3in1       Precautions / Restrictions Precautions Precautions: Fall Restrictions Weight Bearing Restrictions: Yes RLE Weight Bearing: Weight bearing as tolerated     Mobility  Bed Mobility Overal bed mobility: Modified Independent     Transfers Overall transfer level: Needs assistance Equipment used: Rolling walker  (2 wheels) Transfers: Sit to/from Stand Sit to Stand: Supervision    Ambulation/Gait Ambulation/Gait assistance: Supervision Gait Distance (Feet): 200 Feet Assistive device: Rolling walker (2 wheels) Gait Pattern/deviations: Step-through pattern Gait velocity: decreased     General Gait Details: Pt safely and easily ambulated 200 ft without LOB or safety concern. doies require VCs for improved posture and keeping from lifting RW   Stairs Stairs: Yes Stairs assistance: Supervision Stair Management: No rails, Backwards, Forwards, Step to pattern Number of Stairs: 3 General stair comments: pt performed ascending descending 3 stair (2 x) without difficulty or safety concern       Cognition Arousal/Alertness: Awake/alert Behavior During Therapy: WFL for tasks assessed/performed Overall Cognitive Status: Within Functional Limits for tasks assessed    General Comments: Pt is A and O x 4           General Comments General comments (skin integrity, edema, etc.): will address ROM, HEP, and strengthing this afternoon.      Pertinent Vitals/Pain Pain Assessment Pain Assessment: No/denies pain Pain Score: 0-No pain     PT Goals (current goals can now be found in the care plan section) Acute Rehab PT Goals Patient Stated Goal: be able to mow grass again Progress towards PT goals: Progressing toward goals    Frequency    BID      PT Plan Current plan remains appropriate       AM-PAC PT "6 Clicks" Mobility   Outcome Measure  Help needed turning from your back to your side while in a flat bed without using bedrails?: A Little Help needed moving from lying on your back to sitting on the side of a flat  bed without using bedrails?: A Little Help needed moving to and from a bed to a chair (including a wheelchair)?: A Little Help needed standing up from a chair using your arms (e.g., wheelchair or bedside chair)?: A Little Help needed to walk in hospital room?: A  Little Help needed climbing 3-5 steps with a railing? : A Little 6 Click Score: 18    End of Session   Activity Tolerance: Patient tolerated treatment well Patient left: in chair;with call bell/phone within reach;with chair alarm set Nurse Communication: Mobility status;Weight bearing status PT Visit Diagnosis: Other abnormalities of gait and mobility (R26.89);Muscle weakness (generalized) (M62.81);Pain Pain - Right/Left: Right Pain - part of body: Knee     Time: 0742-0829 PT Time Calculation (min) (ACUTE ONLY): 47 min  Charges:  $Gait Training: 23-37 mins $Therapeutic Activity: 8-22 mins                     Julaine Fusi PTA 10/19/21, 8:49 AM

## 2021-10-20 DIAGNOSIS — F419 Anxiety disorder, unspecified: Secondary | ICD-10-CM | POA: Diagnosis not present

## 2021-10-20 DIAGNOSIS — E663 Overweight: Secondary | ICD-10-CM | POA: Diagnosis not present

## 2021-10-20 DIAGNOSIS — Z6828 Body mass index (BMI) 28.0-28.9, adult: Secondary | ICD-10-CM | POA: Diagnosis not present

## 2021-10-20 DIAGNOSIS — Z96651 Presence of right artificial knee joint: Secondary | ICD-10-CM | POA: Diagnosis not present

## 2021-10-20 DIAGNOSIS — Z7984 Long term (current) use of oral hypoglycemic drugs: Secondary | ICD-10-CM | POA: Diagnosis not present

## 2021-10-20 DIAGNOSIS — Z4789 Encounter for other orthopedic aftercare: Secondary | ICD-10-CM | POA: Diagnosis not present

## 2021-10-20 DIAGNOSIS — H6123 Impacted cerumen, bilateral: Secondary | ICD-10-CM | POA: Diagnosis not present

## 2021-10-20 DIAGNOSIS — D696 Thrombocytopenia, unspecified: Secondary | ICD-10-CM | POA: Diagnosis not present

## 2021-10-20 DIAGNOSIS — Z7901 Long term (current) use of anticoagulants: Secondary | ICD-10-CM | POA: Diagnosis not present

## 2021-10-20 DIAGNOSIS — K219 Gastro-esophageal reflux disease without esophagitis: Secondary | ICD-10-CM | POA: Diagnosis not present

## 2021-10-20 DIAGNOSIS — I10 Essential (primary) hypertension: Secondary | ICD-10-CM | POA: Diagnosis not present

## 2021-10-20 DIAGNOSIS — E039 Hypothyroidism, unspecified: Secondary | ICD-10-CM | POA: Diagnosis not present

## 2021-10-20 DIAGNOSIS — M810 Age-related osteoporosis without current pathological fracture: Secondary | ICD-10-CM | POA: Diagnosis not present

## 2021-10-20 DIAGNOSIS — E785 Hyperlipidemia, unspecified: Secondary | ICD-10-CM | POA: Diagnosis not present

## 2021-10-20 DIAGNOSIS — I700 Atherosclerosis of aorta: Secondary | ICD-10-CM | POA: Diagnosis not present

## 2021-10-20 DIAGNOSIS — Z471 Aftercare following joint replacement surgery: Secondary | ICD-10-CM | POA: Diagnosis not present

## 2021-10-20 DIAGNOSIS — Z87891 Personal history of nicotine dependence: Secondary | ICD-10-CM | POA: Diagnosis not present

## 2021-10-20 DIAGNOSIS — E1169 Type 2 diabetes mellitus with other specified complication: Secondary | ICD-10-CM | POA: Diagnosis not present

## 2021-10-20 DIAGNOSIS — N200 Calculus of kidney: Secondary | ICD-10-CM | POA: Diagnosis not present

## 2021-10-21 DIAGNOSIS — E785 Hyperlipidemia, unspecified: Secondary | ICD-10-CM | POA: Diagnosis not present

## 2021-10-21 DIAGNOSIS — Z87891 Personal history of nicotine dependence: Secondary | ICD-10-CM | POA: Diagnosis not present

## 2021-10-21 DIAGNOSIS — Z96651 Presence of right artificial knee joint: Secondary | ICD-10-CM | POA: Diagnosis not present

## 2021-10-21 DIAGNOSIS — E1169 Type 2 diabetes mellitus with other specified complication: Secondary | ICD-10-CM | POA: Diagnosis not present

## 2021-10-21 DIAGNOSIS — I700 Atherosclerosis of aorta: Secondary | ICD-10-CM | POA: Diagnosis not present

## 2021-10-21 DIAGNOSIS — E039 Hypothyroidism, unspecified: Secondary | ICD-10-CM | POA: Diagnosis not present

## 2021-10-21 DIAGNOSIS — H6123 Impacted cerumen, bilateral: Secondary | ICD-10-CM | POA: Diagnosis not present

## 2021-10-21 DIAGNOSIS — Z7984 Long term (current) use of oral hypoglycemic drugs: Secondary | ICD-10-CM | POA: Diagnosis not present

## 2021-10-21 DIAGNOSIS — K219 Gastro-esophageal reflux disease without esophagitis: Secondary | ICD-10-CM | POA: Diagnosis not present

## 2021-10-21 DIAGNOSIS — M810 Age-related osteoporosis without current pathological fracture: Secondary | ICD-10-CM | POA: Diagnosis not present

## 2021-10-21 DIAGNOSIS — Z7901 Long term (current) use of anticoagulants: Secondary | ICD-10-CM | POA: Diagnosis not present

## 2021-10-21 DIAGNOSIS — E663 Overweight: Secondary | ICD-10-CM | POA: Diagnosis not present

## 2021-10-21 DIAGNOSIS — I10 Essential (primary) hypertension: Secondary | ICD-10-CM | POA: Diagnosis not present

## 2021-10-21 DIAGNOSIS — F419 Anxiety disorder, unspecified: Secondary | ICD-10-CM | POA: Diagnosis not present

## 2021-10-21 DIAGNOSIS — N200 Calculus of kidney: Secondary | ICD-10-CM | POA: Diagnosis not present

## 2021-10-21 DIAGNOSIS — Z6828 Body mass index (BMI) 28.0-28.9, adult: Secondary | ICD-10-CM | POA: Diagnosis not present

## 2021-10-21 DIAGNOSIS — D696 Thrombocytopenia, unspecified: Secondary | ICD-10-CM | POA: Diagnosis not present

## 2021-10-21 DIAGNOSIS — Z471 Aftercare following joint replacement surgery: Secondary | ICD-10-CM | POA: Diagnosis not present

## 2021-10-23 DIAGNOSIS — M810 Age-related osteoporosis without current pathological fracture: Secondary | ICD-10-CM | POA: Diagnosis not present

## 2021-10-23 DIAGNOSIS — E785 Hyperlipidemia, unspecified: Secondary | ICD-10-CM | POA: Diagnosis not present

## 2021-10-23 DIAGNOSIS — Z87891 Personal history of nicotine dependence: Secondary | ICD-10-CM | POA: Diagnosis not present

## 2021-10-23 DIAGNOSIS — H6123 Impacted cerumen, bilateral: Secondary | ICD-10-CM | POA: Diagnosis not present

## 2021-10-23 DIAGNOSIS — E039 Hypothyroidism, unspecified: Secondary | ICD-10-CM | POA: Diagnosis not present

## 2021-10-23 DIAGNOSIS — Z471 Aftercare following joint replacement surgery: Secondary | ICD-10-CM | POA: Diagnosis not present

## 2021-10-23 DIAGNOSIS — K219 Gastro-esophageal reflux disease without esophagitis: Secondary | ICD-10-CM | POA: Diagnosis not present

## 2021-10-23 DIAGNOSIS — Z7984 Long term (current) use of oral hypoglycemic drugs: Secondary | ICD-10-CM | POA: Diagnosis not present

## 2021-10-23 DIAGNOSIS — N200 Calculus of kidney: Secondary | ICD-10-CM | POA: Diagnosis not present

## 2021-10-23 DIAGNOSIS — E1169 Type 2 diabetes mellitus with other specified complication: Secondary | ICD-10-CM | POA: Diagnosis not present

## 2021-10-23 DIAGNOSIS — Z6828 Body mass index (BMI) 28.0-28.9, adult: Secondary | ICD-10-CM | POA: Diagnosis not present

## 2021-10-23 DIAGNOSIS — E663 Overweight: Secondary | ICD-10-CM | POA: Diagnosis not present

## 2021-10-23 DIAGNOSIS — F419 Anxiety disorder, unspecified: Secondary | ICD-10-CM | POA: Diagnosis not present

## 2021-10-23 DIAGNOSIS — I700 Atherosclerosis of aorta: Secondary | ICD-10-CM | POA: Diagnosis not present

## 2021-10-23 DIAGNOSIS — D696 Thrombocytopenia, unspecified: Secondary | ICD-10-CM | POA: Diagnosis not present

## 2021-10-23 DIAGNOSIS — I10 Essential (primary) hypertension: Secondary | ICD-10-CM | POA: Diagnosis not present

## 2021-10-23 DIAGNOSIS — Z96651 Presence of right artificial knee joint: Secondary | ICD-10-CM | POA: Diagnosis not present

## 2021-10-23 DIAGNOSIS — Z7901 Long term (current) use of anticoagulants: Secondary | ICD-10-CM | POA: Diagnosis not present

## 2021-10-25 DIAGNOSIS — N200 Calculus of kidney: Secondary | ICD-10-CM | POA: Diagnosis not present

## 2021-10-25 DIAGNOSIS — Z96651 Presence of right artificial knee joint: Secondary | ICD-10-CM | POA: Diagnosis not present

## 2021-10-25 DIAGNOSIS — I10 Essential (primary) hypertension: Secondary | ICD-10-CM | POA: Diagnosis not present

## 2021-10-25 DIAGNOSIS — Z7901 Long term (current) use of anticoagulants: Secondary | ICD-10-CM | POA: Diagnosis not present

## 2021-10-25 DIAGNOSIS — E039 Hypothyroidism, unspecified: Secondary | ICD-10-CM | POA: Diagnosis not present

## 2021-10-25 DIAGNOSIS — M810 Age-related osteoporosis without current pathological fracture: Secondary | ICD-10-CM | POA: Diagnosis not present

## 2021-10-25 DIAGNOSIS — D696 Thrombocytopenia, unspecified: Secondary | ICD-10-CM | POA: Diagnosis not present

## 2021-10-25 DIAGNOSIS — Z6828 Body mass index (BMI) 28.0-28.9, adult: Secondary | ICD-10-CM | POA: Diagnosis not present

## 2021-10-25 DIAGNOSIS — I700 Atherosclerosis of aorta: Secondary | ICD-10-CM | POA: Diagnosis not present

## 2021-10-25 DIAGNOSIS — K219 Gastro-esophageal reflux disease without esophagitis: Secondary | ICD-10-CM | POA: Diagnosis not present

## 2021-10-25 DIAGNOSIS — Z7984 Long term (current) use of oral hypoglycemic drugs: Secondary | ICD-10-CM | POA: Diagnosis not present

## 2021-10-25 DIAGNOSIS — Z471 Aftercare following joint replacement surgery: Secondary | ICD-10-CM | POA: Diagnosis not present

## 2021-10-25 DIAGNOSIS — F419 Anxiety disorder, unspecified: Secondary | ICD-10-CM | POA: Diagnosis not present

## 2021-10-25 DIAGNOSIS — H6123 Impacted cerumen, bilateral: Secondary | ICD-10-CM | POA: Diagnosis not present

## 2021-10-25 DIAGNOSIS — E663 Overweight: Secondary | ICD-10-CM | POA: Diagnosis not present

## 2021-10-25 DIAGNOSIS — E785 Hyperlipidemia, unspecified: Secondary | ICD-10-CM | POA: Diagnosis not present

## 2021-10-25 DIAGNOSIS — E1169 Type 2 diabetes mellitus with other specified complication: Secondary | ICD-10-CM | POA: Diagnosis not present

## 2021-10-25 DIAGNOSIS — Z87891 Personal history of nicotine dependence: Secondary | ICD-10-CM | POA: Diagnosis not present

## 2021-10-27 DIAGNOSIS — Z7901 Long term (current) use of anticoagulants: Secondary | ICD-10-CM | POA: Diagnosis not present

## 2021-10-27 DIAGNOSIS — Z7984 Long term (current) use of oral hypoglycemic drugs: Secondary | ICD-10-CM | POA: Diagnosis not present

## 2021-10-27 DIAGNOSIS — E785 Hyperlipidemia, unspecified: Secondary | ICD-10-CM | POA: Diagnosis not present

## 2021-10-27 DIAGNOSIS — Z471 Aftercare following joint replacement surgery: Secondary | ICD-10-CM | POA: Diagnosis not present

## 2021-10-27 DIAGNOSIS — E663 Overweight: Secondary | ICD-10-CM | POA: Diagnosis not present

## 2021-10-27 DIAGNOSIS — Z6828 Body mass index (BMI) 28.0-28.9, adult: Secondary | ICD-10-CM | POA: Diagnosis not present

## 2021-10-27 DIAGNOSIS — M810 Age-related osteoporosis without current pathological fracture: Secondary | ICD-10-CM | POA: Diagnosis not present

## 2021-10-27 DIAGNOSIS — Z87891 Personal history of nicotine dependence: Secondary | ICD-10-CM | POA: Diagnosis not present

## 2021-10-27 DIAGNOSIS — E1169 Type 2 diabetes mellitus with other specified complication: Secondary | ICD-10-CM | POA: Diagnosis not present

## 2021-10-27 DIAGNOSIS — E039 Hypothyroidism, unspecified: Secondary | ICD-10-CM | POA: Diagnosis not present

## 2021-10-27 DIAGNOSIS — N200 Calculus of kidney: Secondary | ICD-10-CM | POA: Diagnosis not present

## 2021-10-27 DIAGNOSIS — I700 Atherosclerosis of aorta: Secondary | ICD-10-CM | POA: Diagnosis not present

## 2021-10-27 DIAGNOSIS — Z96651 Presence of right artificial knee joint: Secondary | ICD-10-CM | POA: Diagnosis not present

## 2021-10-27 DIAGNOSIS — F419 Anxiety disorder, unspecified: Secondary | ICD-10-CM | POA: Diagnosis not present

## 2021-10-27 DIAGNOSIS — K219 Gastro-esophageal reflux disease without esophagitis: Secondary | ICD-10-CM | POA: Diagnosis not present

## 2021-10-27 DIAGNOSIS — D696 Thrombocytopenia, unspecified: Secondary | ICD-10-CM | POA: Diagnosis not present

## 2021-10-27 DIAGNOSIS — I10 Essential (primary) hypertension: Secondary | ICD-10-CM | POA: Diagnosis not present

## 2021-10-27 DIAGNOSIS — H6123 Impacted cerumen, bilateral: Secondary | ICD-10-CM | POA: Diagnosis not present

## 2021-10-30 DIAGNOSIS — Z87891 Personal history of nicotine dependence: Secondary | ICD-10-CM | POA: Diagnosis not present

## 2021-10-30 DIAGNOSIS — E663 Overweight: Secondary | ICD-10-CM | POA: Diagnosis not present

## 2021-10-30 DIAGNOSIS — F419 Anxiety disorder, unspecified: Secondary | ICD-10-CM | POA: Diagnosis not present

## 2021-10-30 DIAGNOSIS — Z471 Aftercare following joint replacement surgery: Secondary | ICD-10-CM | POA: Diagnosis not present

## 2021-10-30 DIAGNOSIS — E039 Hypothyroidism, unspecified: Secondary | ICD-10-CM | POA: Diagnosis not present

## 2021-10-30 DIAGNOSIS — M810 Age-related osteoporosis without current pathological fracture: Secondary | ICD-10-CM | POA: Diagnosis not present

## 2021-10-30 DIAGNOSIS — H6123 Impacted cerumen, bilateral: Secondary | ICD-10-CM | POA: Diagnosis not present

## 2021-10-30 DIAGNOSIS — Z7984 Long term (current) use of oral hypoglycemic drugs: Secondary | ICD-10-CM | POA: Diagnosis not present

## 2021-10-30 DIAGNOSIS — K219 Gastro-esophageal reflux disease without esophagitis: Secondary | ICD-10-CM | POA: Diagnosis not present

## 2021-10-30 DIAGNOSIS — E785 Hyperlipidemia, unspecified: Secondary | ICD-10-CM | POA: Diagnosis not present

## 2021-10-30 DIAGNOSIS — D696 Thrombocytopenia, unspecified: Secondary | ICD-10-CM | POA: Diagnosis not present

## 2021-10-30 DIAGNOSIS — E1169 Type 2 diabetes mellitus with other specified complication: Secondary | ICD-10-CM | POA: Diagnosis not present

## 2021-10-30 DIAGNOSIS — I700 Atherosclerosis of aorta: Secondary | ICD-10-CM | POA: Diagnosis not present

## 2021-10-30 DIAGNOSIS — N200 Calculus of kidney: Secondary | ICD-10-CM | POA: Diagnosis not present

## 2021-10-30 DIAGNOSIS — Z6828 Body mass index (BMI) 28.0-28.9, adult: Secondary | ICD-10-CM | POA: Diagnosis not present

## 2021-10-30 DIAGNOSIS — Z7901 Long term (current) use of anticoagulants: Secondary | ICD-10-CM | POA: Diagnosis not present

## 2021-10-30 DIAGNOSIS — I10 Essential (primary) hypertension: Secondary | ICD-10-CM | POA: Diagnosis not present

## 2021-10-30 DIAGNOSIS — Z96651 Presence of right artificial knee joint: Secondary | ICD-10-CM | POA: Diagnosis not present

## 2021-11-01 DIAGNOSIS — Z96651 Presence of right artificial knee joint: Secondary | ICD-10-CM | POA: Diagnosis not present

## 2021-11-01 DIAGNOSIS — M810 Age-related osteoporosis without current pathological fracture: Secondary | ICD-10-CM | POA: Diagnosis not present

## 2021-11-01 DIAGNOSIS — N200 Calculus of kidney: Secondary | ICD-10-CM | POA: Diagnosis not present

## 2021-11-01 DIAGNOSIS — Z471 Aftercare following joint replacement surgery: Secondary | ICD-10-CM | POA: Diagnosis not present

## 2021-11-01 DIAGNOSIS — K219 Gastro-esophageal reflux disease without esophagitis: Secondary | ICD-10-CM | POA: Diagnosis not present

## 2021-11-01 DIAGNOSIS — D696 Thrombocytopenia, unspecified: Secondary | ICD-10-CM | POA: Diagnosis not present

## 2021-11-01 DIAGNOSIS — I700 Atherosclerosis of aorta: Secondary | ICD-10-CM | POA: Diagnosis not present

## 2021-11-01 DIAGNOSIS — E039 Hypothyroidism, unspecified: Secondary | ICD-10-CM | POA: Diagnosis not present

## 2021-11-01 DIAGNOSIS — E663 Overweight: Secondary | ICD-10-CM | POA: Diagnosis not present

## 2021-11-01 DIAGNOSIS — H6123 Impacted cerumen, bilateral: Secondary | ICD-10-CM | POA: Diagnosis not present

## 2021-11-01 DIAGNOSIS — E785 Hyperlipidemia, unspecified: Secondary | ICD-10-CM | POA: Diagnosis not present

## 2021-11-01 DIAGNOSIS — Z7984 Long term (current) use of oral hypoglycemic drugs: Secondary | ICD-10-CM | POA: Diagnosis not present

## 2021-11-01 DIAGNOSIS — F419 Anxiety disorder, unspecified: Secondary | ICD-10-CM | POA: Diagnosis not present

## 2021-11-01 DIAGNOSIS — I10 Essential (primary) hypertension: Secondary | ICD-10-CM | POA: Diagnosis not present

## 2021-11-01 DIAGNOSIS — Z7901 Long term (current) use of anticoagulants: Secondary | ICD-10-CM | POA: Diagnosis not present

## 2021-11-01 DIAGNOSIS — E1169 Type 2 diabetes mellitus with other specified complication: Secondary | ICD-10-CM | POA: Diagnosis not present

## 2021-11-01 DIAGNOSIS — Z87891 Personal history of nicotine dependence: Secondary | ICD-10-CM | POA: Diagnosis not present

## 2021-11-01 DIAGNOSIS — Z6828 Body mass index (BMI) 28.0-28.9, adult: Secondary | ICD-10-CM | POA: Diagnosis not present

## 2021-11-02 DIAGNOSIS — Z96651 Presence of right artificial knee joint: Secondary | ICD-10-CM | POA: Diagnosis not present

## 2021-11-07 DIAGNOSIS — Z96651 Presence of right artificial knee joint: Secondary | ICD-10-CM | POA: Diagnosis not present

## 2021-11-10 DIAGNOSIS — Z471 Aftercare following joint replacement surgery: Secondary | ICD-10-CM | POA: Diagnosis not present

## 2021-11-10 DIAGNOSIS — Z96651 Presence of right artificial knee joint: Secondary | ICD-10-CM | POA: Diagnosis not present

## 2021-11-14 DIAGNOSIS — Z96651 Presence of right artificial knee joint: Secondary | ICD-10-CM | POA: Diagnosis not present

## 2021-11-14 DIAGNOSIS — M25561 Pain in right knee: Secondary | ICD-10-CM | POA: Diagnosis not present

## 2021-11-16 DIAGNOSIS — M25561 Pain in right knee: Secondary | ICD-10-CM | POA: Diagnosis not present

## 2021-11-16 DIAGNOSIS — Z96651 Presence of right artificial knee joint: Secondary | ICD-10-CM | POA: Diagnosis not present

## 2021-11-20 DIAGNOSIS — Z96651 Presence of right artificial knee joint: Secondary | ICD-10-CM | POA: Diagnosis not present

## 2021-11-23 ENCOUNTER — Other Ambulatory Visit: Payer: Self-pay | Admitting: Internal Medicine

## 2021-11-23 DIAGNOSIS — Z96651 Presence of right artificial knee joint: Secondary | ICD-10-CM | POA: Diagnosis not present

## 2021-11-23 DIAGNOSIS — M6281 Muscle weakness (generalized): Secondary | ICD-10-CM | POA: Diagnosis not present

## 2021-11-24 ENCOUNTER — Other Ambulatory Visit: Payer: Self-pay | Admitting: Internal Medicine

## 2021-11-28 DIAGNOSIS — Z96651 Presence of right artificial knee joint: Secondary | ICD-10-CM | POA: Diagnosis not present

## 2021-11-30 DIAGNOSIS — Z96651 Presence of right artificial knee joint: Secondary | ICD-10-CM | POA: Diagnosis not present

## 2021-12-05 DIAGNOSIS — Z96651 Presence of right artificial knee joint: Secondary | ICD-10-CM | POA: Diagnosis not present

## 2021-12-07 DIAGNOSIS — Z96651 Presence of right artificial knee joint: Secondary | ICD-10-CM | POA: Diagnosis not present

## 2021-12-14 DIAGNOSIS — Z96651 Presence of right artificial knee joint: Secondary | ICD-10-CM | POA: Diagnosis not present

## 2021-12-20 ENCOUNTER — Encounter: Payer: Self-pay | Admitting: Internal Medicine

## 2021-12-20 ENCOUNTER — Telehealth: Payer: Self-pay

## 2021-12-20 ENCOUNTER — Ambulatory Visit (INDEPENDENT_AMBULATORY_CARE_PROVIDER_SITE_OTHER): Payer: Medicare HMO | Admitting: Internal Medicine

## 2021-12-20 VITALS — BP 138/62 | HR 55 | Ht 63.0 in

## 2021-12-20 DIAGNOSIS — R42 Dizziness and giddiness: Secondary | ICD-10-CM | POA: Diagnosis not present

## 2021-12-20 DIAGNOSIS — I10 Essential (primary) hypertension: Secondary | ICD-10-CM | POA: Diagnosis not present

## 2021-12-20 DIAGNOSIS — R2689 Other abnormalities of gait and mobility: Secondary | ICD-10-CM

## 2021-12-20 DIAGNOSIS — R531 Weakness: Secondary | ICD-10-CM | POA: Diagnosis not present

## 2021-12-20 DIAGNOSIS — R001 Bradycardia, unspecified: Secondary | ICD-10-CM

## 2021-12-20 DIAGNOSIS — I639 Cerebral infarction, unspecified: Secondary | ICD-10-CM | POA: Diagnosis not present

## 2021-12-20 DIAGNOSIS — R269 Unspecified abnormalities of gait and mobility: Secondary | ICD-10-CM | POA: Diagnosis not present

## 2021-12-20 DIAGNOSIS — Z8249 Family history of ischemic heart disease and other diseases of the circulatory system: Secondary | ICD-10-CM | POA: Diagnosis not present

## 2021-12-20 DIAGNOSIS — H6123 Impacted cerumen, bilateral: Secondary | ICD-10-CM | POA: Diagnosis not present

## 2021-12-20 NOTE — Addendum Note (Signed)
Addended by: Orland Mustard on: 12/20/2021 05:31 PM ? ? Modules accepted: Orders ? ?

## 2021-12-20 NOTE — Addendum Note (Signed)
Addended by: Orland Mustard on: 12/20/2021 03:36 PM ? ? Modules accepted: Orders ? ?

## 2021-12-20 NOTE — Patient Instructions (Addendum)
01/18/2022 Post Op Orthopaedics Hooten, Florinda Marker., MD   ?[address]   ?[place]   ?[place], [place] [address]   ?[phone] (Work)   ?[phone] (Fax)    ? ?Vertigo ?Vertigo is the feeling that you or your surroundings are moving when they are not. This feeling can come and go at any time. Vertigo often goes away on its own. Vertigo can be dangerous if it occurs while you are doing something that could endanger yourself or others, such as driving or operating machinery. ?Your health care provider will do tests to try to determine the cause of your vertigo. Tests will also help your health care provider decide how best to treat your condition. ?Follow these instructions at home: ?Eating and drinking ? ?  ? ?Dehydration can make vertigo worse. Drink enough fluid to keep your urine pale yellow. ?Do not drink alcohol. ?Activity ?Return to your normal activities as told by your health care provider. Ask your health care provider what activities are safe for you. ?In the morning, first sit up on the side of the bed. When you feel okay, stand slowly while you hold onto something until you know that your balance is fine. ?Move slowly. Avoid sudden body or head movements or certain positions, as told by your health care provider. ?If you have trouble walking or keeping your balance, try using a cane for stability. If you feel dizzy or unstable, sit down right away. ?Avoid doing any tasks that would cause danger to you or others if vertigo occurs. ?Avoid bending down if you feel dizzy. Place items in your home so that they are easy for you to reach without bending or leaning over. ?Do not drive or use machinery if you feel dizzy. ?General instructions ?Take over-the-counter and prescription medicines only as told by your health care provider. ?Keep all follow-up visits. This is important. ?Contact a health care provider if: ?Your medicines do not relieve your vertigo or they make it worse. ?Your  condition gets worse or you develop new symptoms. ?You have a fever. ?You develop nausea or vomiting, or if nausea gets worse. ?Your family or friends notice any behavioral changes. ?You have numbness or a prickling and tingling sensation in part of your body. ?Get help right away if you: ?Are always dizzy or you faint. ?Develop severe headaches. ?Develop a stiff neck. ?Develop sensitivity to light. ?Have difficulty moving or speaking. ?Have weakness in your hands, arms, or legs. ?Have changes in your hearing or vision. ?These symptoms may represent a serious problem that is an emergency. Do not wait to see if the symptoms will go away. Get medical help right away. Call your local emergency services (911 in the [place] Do not drive yourself to the hospital. ?Summary ?Vertigo is the feeling that you or your surroundings are moving when they are not. ?Your health care provider will do tests to try to determine the cause of your vertigo. ?Follow instructions for home care. You may be told to avoid certain tasks, positions, or movements. ?Contact a health care provider if your medicines do not relieve your symptoms, or if you have a fever, nausea, vomiting, or changes in behavior. ?Get help right away if you have severe headaches or difficulty speaking, or you develop hearing or vision problems. ?This information is not intended to replace advice given to you by your health care provider. Make sure you discuss any questions you have with your health care provider. ?Document Revised: 07/20/2020 Document  Reviewed: 07/20/2020 ?Elsevier Patient Education ? [place]. ? ?Dizziness ?Dizziness is a common problem. It is a feeling of unsteadiness or light-headedness. You may feel like you are about to faint. Dizziness can lead to injury if you stumble or fall. Anyone can become dizzy, but dizziness is more common in older adults. This condition can be caused by a number of things, including medicines, dehydration, or  illness. ?Follow these instructions at home: ?Eating and drinking ? ?Drink enough fluid to keep your urine pale yellow. This helps to keep you from becoming dehydrated. Try to drink more clear fluids, such as water. ?Do not drink alcohol. ?Limit your caffeine intake if told to do so by your health care provider. Check ingredients and nutrition facts to see if a food or beverage contains caffeine. ?Limit your salt (sodium) intake if told to do so by your health care provider. Check ingredients and nutrition facts to see if a food or beverage contains sodium. ?Activity ? ?Avoid making quick movements. ?Rise slowly from chairs and steady yourself until you feel okay. ?In the morning, first sit up on the side of the bed. When you feel okay, stand slowly while you hold onto something until you know that your balance is good. ?If you need to stand in one place for a long time, move your legs often. Tighten and relax the muscles in your legs while you are standing. ?Do not drive or use machinery if you feel dizzy. ?Avoid bending down if you feel dizzy. Place items in your home so that they are easy for you to reach without leaning over. ?Lifestyle ?Do not use any products that contain nicotine or tobacco. These products include cigarettes, chewing tobacco, and vaping devices, such as e-cigarettes. If you need help quitting, ask your health care provider. ?Try to reduce your stress level by using methods such as yoga or meditation. Talk with your health care provider if you need help to manage your stress. ?General instructions ?Watch your dizziness for any changes. ?Take over-the-counter and prescription medicines only as told by your health care provider. Talk with your health care provider if you think that your dizziness is caused by a medicine that you are taking. ?Tell a friend or a family member that you are feeling dizzy. If he or she notices any changes in your behavior, have this person call your health care  provider. ?Keep all follow-up visits. This is important. ?Contact a health care provider if: ?Your dizziness does not go away or you have new symptoms. ?Your dizziness or light-headedness gets worse. ?You feel nauseous. ?You have reduced hearing. ?You have a fever. ?You have neck pain or a stiff neck. ?Your dizziness leads to an injury or a fall. ?Get help right away if: ?You vomit or have diarrhea and are unable to eat or drink anything. ?You have problems talking, walking, swallowing, or using your arms, hands, or legs. ?You feel generally weak. ?You have any bleeding. ?You are not thinking clearly or you have trouble forming sentences. It may take a friend or family member to notice this. ?You have chest pain, abdominal pain, shortness of breath, or sweating. ?Your vision changes or you develop a severe headache. ?These symptoms may represent a serious problem that is an emergency. Do not wait to see if the symptoms will go away. Get medical help right away. Call your local emergency services (911 in the [place] Do not drive yourself to the hospital. ?Summary ?Dizziness is a feeling of unsteadiness  or light-headedness. This condition can be caused by a number of things, including medicines, dehydration, or illness. ?Anyone can become dizzy, but dizziness is more common in older adults. ?Drink enough fluid to keep your urine pale yellow. Do not drink alcohol. ?Avoid making quick movements if you feel dizzy. Monitor your dizziness for any changes. ?This information is not intended to replace advice given to you by your health care provider. Make sure you discuss any questions you have with your health care provider. ?Document Revised: 07/25/2020 Document Reviewed: 07/25/2020 ?Elsevier Patient Education ? [place]. ? ?

## 2021-12-20 NOTE — Progress Notes (Addendum)
 Chief Complaint  Patient presents with   Dizziness    Started this morning when patient was getting up to use the bathroom & she stated her head did not feel right. She was so dizzy that she was having to hold on to whatever was near so that she didn't fall.    F/u with daughter 1. Dizziness and off balance 3 or 4 am today felt off balance and cant stand hard to walk having to hold onto objects, head felt funny when go to stand lamp felt like was moving . She has felt like the room was spinning when getting up felt like when getting up. Feeling lightheaded  FH aneurysm mom and sister  Checked blood sugar 78 last night and ate reese cup and increased sugar this am 100s today. Has not had dm meds today No head trauma   2. Sinus brady 10/2021 and during knee surgery 10/18/21 under anesthesia her heart rate was in the 40s   3. Htn elevated on losartan  50 mg qd  4. Right knee replacement 10/18/21 Dr. Mardee took a long time to heal with drainage from the area none today and cbc with Dr. Mardee normal 11/2021     Review of Systems  Constitutional:  Negative for weight loss.  HENT:  Negative for hearing loss.   Eyes:  Negative for blurred vision.  Respiratory:  Negative for shortness of breath.   Cardiovascular:  Negative for chest pain.  Gastrointestinal:  Negative for abdominal pain and blood in stool.  Genitourinary:  Negative for dysuria.  Musculoskeletal:  Negative for falls and joint pain.  Skin:  Negative for rash.  Neurological:  Positive for dizziness and weakness. Negative for headaches.  Psychiatric/Behavioral:  Negative for depression.   Past Medical History:  Diagnosis Date   Anxiety    Diabetes mellitus    GERD (gastroesophageal reflux disease)    History of kidney stones    Hyperlipidemia    Hypertension    Hypothyroidism    Osteoporosis    Positive colorectal cancer screening using Cologuard test 2019   Thyroid  disease    Past Surgical History:  Procedure Laterality  Date   BREAST CYST ASPIRATION Bilateral 25+ yrs ago   CATARACT EXTRACTION W/PHACO Right 03/02/2015   Procedure: CATARACT EXTRACTION PHACO AND INTRAOCULAR LENS PLACEMENT (IOC) TORIC LENS;  Surgeon: Dene Etienne, MD;  Location: [facility];  Service: Ophthalmology;  Laterality: Right;  TORIC   CATARACT EXTRACTION W/PHACO Left 03/30/2015   Procedure: CATARACT EXTRACTION PHACO AND INTRAOCULAR LENS PLACEMENT (IOC);  Surgeon: Dene Etienne, MD;  Location: [facility];  Service: Ophthalmology;  Laterality: Left;  TORIC  DIABETIC - oral meds   CERVICAL POLYPECTOMY     COLONOSCOPY  01/2007   Dr Obie   COLONOSCOPY WITH PROPOFOL  N/A 10/23/2017   Procedure: COLONOSCOPY WITH PROPOFOL ;  Surgeon: Dessa Reyes ORN, MD;  Location: [facility];  Service: Endoscopy;  Laterality: N/A;   CYSTOSCOPY W/ RETROGRADES Bilateral 12/07/2019   Procedure: CYSTOSCOPY WITH RETROGRADE PYELOGRAM;  Surgeon: Penne Knee, MD;  Location: [facility];  Service: Urology;  Laterality: Bilateral;   CYSTOSCOPY/URETEROSCOPY/HOLMIUM LASER/STENT PLACEMENT Right 12/07/2019   Procedure: CYSTOSCOPY/URETEROSCOPY/HOLMIUM LASER/STENT PLACEMENT;  Surgeon: Penne Knee, MD;  Location: [facility];  Service: Urology;  Laterality: Right;   EYE SURGERY     HUMERUS FRACTURE SURGERY     KNEE ARTHROPLASTY Right 10/18/2021   Procedure: COMPUTER ASSISTED TOTAL KNEE ARTHROPLASTY;  Surgeon: Mardee Lynwood SQUIBB, MD;  Location: [facility];  Service: Orthopedics;  Laterality: Right;   TUBAL LIGATION  1982   VAGINAL DELIVERY     WRIST FRACTURE SURGERY     Family History  Problem Relation Age of Onset   Hypertension Mother    Stroke Mother 45       hemorrhagic   Heart disease Father    Cancer Father        Lung CA,  died of AMI while in [place] getting Laetril   Diabetes Brother    Hypertension Brother    Hypertension Son    Heart disease Maternal Grandfather    Cancer Maternal Grandfather    Breast cancer Maternal  Grandmother 18   Depression Sister    Social History   Socioeconomic History   Marital status: Widowed    Spouse name: Not on file   Number of children: 2   Years of education: Not on file   Highest education level: Not on file  Occupational History   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 Lost brother also. Has living will. Son and daughter hold health care POA. Would accept resuscitation but no prolonged artificial ventilation. Not sure about feeding tube.    Social Determinants of Health   Financial Resource Strain: Not on file  Food Insecurity: Not on file  Transportation Needs: Not on file  Physical Activity: Not on file  Stress: Not on file  Social Connections: Not on file  Intimate Partner Violence: Not on file   Current Meds  Medication Sig   acetaminophen  (TYLENOL ) 650 MG CR tablet Take 1,300 mg by mouth 2 (two) times daily as needed (knee pain.).   ALPRAZolam  (XANAX ) 0.25 MG tablet Take 0.5 tablets (0.125 mg total) by mouth at bedtime as needed for anxiety.   atorvastatin  (LIPITOR) 20 MG tablet TAKE 1 TABLET BY MOUTH EVERY DAY   Blood Glucose Monitoring Suppl (ONE TOUCH ULTRA 2) w/Device KIT Use to check blood sugars once daily.   cholecalciferol  (VITAMIN D ) 25 MCG (1000 UNIT) tablet Take 2 tablets (2,000 Units total) by mouth daily. (Patient taking differently: Take 1,000 Units by mouth daily before lunch.)   glipiZIDE  (GLUCOTROL ) 5 MG tablet 1/2 tablet at breakfast,  and 2 tablets at dinnertime (Patient taking differently: Take 5-10 mg by mouth See admin instructions. Take 1 tablet (5 mg) by mouth in the morning & take 2 tablets (10 mg) by mouth with supper)    glucose blood (ONETOUCH ULTRA) test strip USE AS DIRECTED   levothyroxine  (SYNTHROID ) 88 MCG tablet TAKE 1 TABLET BY MOUTH EVERY DAY ON EMPTY STOMACH WITH WATER AT LEAST 30-60 MINS BEFORE BREAKFAST   losartan  (COZAAR ) 50 MG tablet TAKE 1 TABLET BY MOUTH EVERY DAY   metFORMIN  (GLUCOPHAGE -XR) 500 MG 24 hr tablet TAKE 1 TABLET BY MOUTH EVERY DAY WITH BREAKFAST   pioglitazone  (ACTOS ) 15 MG tablet TAKE 1 TABLET (15 MG TOTAL) BY MOUTH DAILY WITH SUPPER   Allergies  Allergen Reactions   Latex Rash    IgE < 0.10 (WNL) on 10/05/2021   Nickel Rash   Recent Results (from the past 2160 hour(s))  Type and screen [facility]  Status: None   Collection Time: 10/05/21 11:15 AM  Result Value Ref Range   ABO/RH(D) O POS    Antibody Screen NEG    Sample Expiration 10/19/2021,2359    Extend sample reason      NO TRANSFUSIONS OR PREGNANCY IN THE PAST 3 MONTHS Performed at [facility], [address] [place], [place] [address]   C-reactive protein     Status: None   Collection Time: 10/05/21 11:15 AM  Result Value Ref Range   CRP <0.5 <1.0 mg/dL    Comment: Performed at [facility], [address], [place], [place] [address]  Surgical pcr screen     Status: None   Collection Time: 10/05/21 11:15 AM   Specimen: Nasal Mucosa; Nasal Swab  Result Value Ref Range   MRSA, PCR NEGATIVE NEGATIVE   Staphylococcus aureus NEGATIVE NEGATIVE    Comment: (NOTE) The Xpert SA Assay (FDA approved for NASAL specimens in patients 45 years of age and older), is one component of a comprehensive surveillance program. It is not intended to diagnose infection nor to guide or monitor treatment. Performed at [facility], [address] [place], [place] [address]   Hemoglobin A1c     Status: Abnormal   Collection Time: 10/05/21 11:32 AM  Result Value Ref Range   Hgb A1c MFr Bld 5.9 (H) 4.8 - 5.6 %    Comment: (NOTE)         Prediabetes: 5.7 - 6.4         Diabetes:  >6.4         Glycemic control for adults with diabetes: <7.0    Mean Plasma Glucose 123 mg/dL    Comment: (NOTE) Performed At: [facility] [address] [place], [place] [id number] Jennette Shorter MD Ey:[id number]   Latex, IgE     Status: None   Collection Time: 10/05/21 11:32 AM  Result Value Ref Range   Class Description Allergens Comment     Comment: (NOTE)    Levels of Specific IgE       Class  Description of Class    ---------------------------  -----  --------------------                   < 0.10         0         Negative           0.10 -    0.31         0/I       Equivocal/Low           0.32 -    0.55         I         Low           0.56 -    1.40         II        Moderate           1.41 -    3.90         III       High           3.91 -   19.00         IV        Very High          19.01 -  100.00         V  Very High                  >100.00         VI        Very High    Latex <0.10 Class 0 kU/L    Comment: (NOTE) Performed At: [facility] [address] [place], [place] [id number] Jennette Shorter MD Ey:[id number]   CBC     Status: None   Collection Time: 10/05/21 11:32 AM  Result Value Ref Range   WBC 5.6 4.0 - 10.5 K/uL   RBC 4.31 3.87 - 5.11 MIL/uL   Hemoglobin 13.0 12.0 - 15.0 g/dL   HCT 60.9 63.9 - 53.9 %   MCV 90.5 80.0 - 100.0 fL   MCH 30.2 26.0 - 34.0 pg   MCHC 33.3 30.0 - 36.0 g/dL   RDW 86.6 88.4 - 84.4 %   Platelets 156 150 - 400 K/uL   nRBC 0.0 0.0 - 0.2 %    Comment: Performed at [facility], [address] [place], [place] [address]  Comprehensive metabolic panel     Status: None   Collection Time: 10/05/21 11:32 AM  Result Value Ref Range   Sodium 137 135 - 145 mmol/L   Potassium 4.2 3.5 - 5.1 mmol/L   Chloride 104 98 - 111 mmol/L   CO2 27 22 - 32 mmol/L   Glucose, Bld 98 70 - 99 mg/dL    Comment: Glucose reference range applies only to samples taken after fasting for at least 8 hours.   BUN 21 8 - 23  mg/dL   Creatinine, Ser 9.20 0.44 - 1.00 mg/dL   Calcium  9.5 8.9 - 10.3 mg/dL   Total Protein 7.3 6.5 - 8.1 g/dL   Albumin 4.2 3.5 - 5.0 g/dL   AST 16 15 - 41 U/L   ALT 13 0 - 44 U/L   Alkaline Phosphatase 88 38 - 126 U/L   Total Bilirubin 0.8 0.3 - 1.2 mg/dL   GFR, Estimated >39 >39 mL/min    Comment: (NOTE) Calculated using the CKD-EPI Creatinine Equation (2021)    Anion gap 6 5 - 15    Comment: Performed at [facility], [address] [place], [place] [address]  Urinalysis, Routine w reflex microscopic     Status: Abnormal   Collection Time: 10/05/21 12:29 PM  Result Value Ref Range   Color, Urine YELLOW YELLOW   APPearance CLEAR CLEAR   Specific Gravity, Urine 1.015 1.005 - 1.030   pH 5.5 5.0 - 8.0   Glucose, UA NEGATIVE NEGATIVE mg/dL   Hgb urine dipstick TRACE (A) NEGATIVE   Bilirubin Urine NEGATIVE NEGATIVE   Ketones, ur NEGATIVE NEGATIVE mg/dL   Protein, ur NEGATIVE NEGATIVE mg/dL   Nitrite NEGATIVE NEGATIVE   Leukocytes,Ua SMALL (A) NEGATIVE    Comment: Performed at [facility], [address] [place], [place] [address]  Urinalysis, Microscopic (reflex)     Status: Abnormal   Collection Time: 10/05/21 12:29 PM  Result Value Ref Range   RBC / HPF 0-5 0 - 5 RBC/hpf   WBC, UA 6-10 0 - 5 WBC/hpf   Bacteria, UA FEW (A) NONE SEEN   Squamous Epithelial / LPF 0-5 0 - 5   Mucus PRESENT     Comment: Performed at [facility], [address] [place], [place] [address]  SARS CORONAVIRUS 2 (TAT 6-24 HRS) Nasopharyngeal Nasopharyngeal Swab     Status: None   Collection Time: 10/16/21  1:26 PM   Specimen: Nasopharyngeal Swab  Result Value Ref Range   SARS Coronavirus 2 NEGATIVE NEGATIVE    Comment: (NOTE) SARS-CoV-2 target nucleic acids are NOT DETECTED.  The SARS-CoV-2 RNA is generally detectable in upper and lower respiratory specimens during the acute phase of infection. Negative results do not preclude SARS-CoV-2 infection, do not rule  out co-infections with other pathogens, and should not be used as the sole basis for treatment or other patient management decisions. Negative results must be combined with clinical observations, patient history, and epidemiological information. The expected result is Negative.  Fact Sheet for Patients: hairslick.no  Fact Sheet for Healthcare Providers: quierodirigir.com  This test is not yet approved or cleared by the [place]  FDA and  has been authorized for detection and/or diagnosis of SARS-CoV-2 by FDA under an Emergency Use Authorization (EUA). This EUA will remain  in effect (meaning this test can be used) for the duration of the COVID-19 declaration under Se ction 564(b)(1) of the Act, 21 U.S.C. section 360bbb-3(b)(1), unless the authorization is terminated or revoked sooner.  Performed at [facility], [address], [place], [place] [address]   Glucose, capillary     Status: Abnormal   Collection Time: 10/18/21  7:30 AM  Result Value Ref Range   Glucose-Capillary 144 (H) 70 - 99 mg/dL    Comment: Glucose reference range applies only to samples taken after fasting for at least 8 hours.  ABO/Rh     Status: None   Collection Time: 10/18/21  8:01 AM  Result Value Ref Range   ABO/RH(D)      O POS Performed at [facility], [address] [place], [place] [address]   Glucose, capillary     Status: Abnormal   Collection Time: 10/18/21 11:44 AM  Result Value Ref Range   Glucose-Capillary 166 (H) 70 - 99 mg/dL    Comment: Glucose reference range applies only to samples taken after fasting for at least 8 hours.  Glucose, capillary     Status: Abnormal   Collection Time: 10/18/21  5:50 PM  Result Value Ref Range   Glucose-Capillary 227 (H) 70 - 99 mg/dL    Comment: Glucose reference range applies only to samples taken after fasting for at least 8 hours.  Glucose, capillary     Status: Abnormal    Collection Time: 10/18/21  9:16 PM  Result Value Ref Range   Glucose-Capillary 215 (H) 70 - 99 mg/dL    Comment: Glucose reference range applies only to samples taken after fasting for at least 8 hours.  Glucose, capillary     Status: Abnormal   Collection Time: 10/19/21  8:59 AM  Result Value Ref Range   Glucose-Capillary 154 (H) 70 - 99 mg/dL    Comment: Glucose reference range applies only to samples taken after fasting for at least 8 hours.  Glucose, capillary     Status: Abnormal   Collection Time: 10/19/21 12:23 PM  Result Value Ref Range   Glucose-Capillary 122 (H) 70 - 99 mg/dL    Comment: Glucose reference range applies only to samples taken after fasting for at least 8 hours.   Objective  Body mass index is 28.27 kg/m. Wt Readings from Last 3 Encounters:  10/18/21 159 lb 9.6 oz (72.4 kg)  10/05/21 159 lb 9.6 oz (72.4 kg)  08/31/21 159 lb 6.4 oz (72.3 kg)   Temp Readings from Last 3 Encounters:  10/19/21 (!) 97.5 F (36.4 C) (Oral)  10/05/21 97.7 F (36.5 C) (Oral)  02/22/21 (!) 96.2 F (35.7 C) (Temporal)   BP Readings from Last 3 Encounters:  12/20/21 138/62  10/19/21 (!) 130/57  10/05/21 (!) 157/68   Pulse Readings from Last 3 Encounters:  12/20/21 (!) 55  10/19/21 (!) 52  10/05/21 (!) 59    Physical Exam Vitals and nursing note reviewed.  Constitutional:      Appearance: Normal appearance. She is well-developed and well-groomed.  HENT:     Head: Normocephalic and atraumatic.     Right Ear: There is impacted cerumen.     Left Ear: There is impacted cerumen.  Eyes:     Conjunctiva/sclera: Conjunctivae normal.     Pupils: Pupils are equal, round, and reactive to light.  Cardiovascular:     Rate and Rhythm: Normal rate and regular rhythm.     Heart sounds: Normal heart sounds. No murmur heard. Pulmonary:     Effort: Pulmonary effort is normal.     Breath sounds: Normal breath sounds.  Abdominal:     General: Abdomen is flat. Bowel sounds are  normal.     Tenderness: There is no abdominal tenderness.  Musculoskeletal:        General: No tenderness.  Skin:    General: Skin is warm and dry.  Neurological:     General: No focal deficit present.     Mental Status: She is alert and oriented to person, place, and time. Mental status is at baseline.     Cranial Nerves: Cranial nerves 2-12 are intact. No cranial nerve deficit.     Sensory: No sensory deficit.     Motor: Weakness present.     Coordination: Coordination is intact.     Gait: Gait is intact.     Comments: In wheelchair today    Psychiatric:        Attention and Perception: Attention and perception normal.        Mood and Affect: Mood and affect normal.        Speech: Speech normal.        Behavior: Behavior normal. Behavior is cooperative.        Thought Content: Thought content normal.        Cognition and Memory: Cognition and memory normal.        Judgment: Judgment normal.    Assessment  Plan  Balance problem r/o stroke/fh aneurym - Plan: CT HEAD WO CONTRAST ( ) Abnormal gait Dizziness  Weakness - Plan: Urinalysis, Routine w reflex microscopic, Urine Culture, Comprehensive metabolic panel, CBC with Differential/Platelet Consider MRI/A brain, US  carotid  IMPRESSION: MRI brain:   1. No evidence of acute intracranial abnormality. 2. Moderate chronic small vessel ischemic changes within the cerebral white matter. 3. 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 4. Mild generalized parenchymal atrophy. 5. Minimal mucosal thickening within the bilateral ethmoid sinuses.   MRA head   1. No intracranial large vessel occlusion or proximal high-grade arterial stenosis. 2. No intracranial aneurysm is identified.     Electronically Signed   By: Rockey Childs D.O.   On: 01/09/2022 07:43  Consider PT again in the future finished last Thursday outpatient PT right TKR Dr. Mardee   Hypertension, improved on losartan  50 mg qd  initially high today   Sinus bradycardia  Consider cardiology in the future disc with PCP rec [place] referred there   Consented and removal b/l ear wax with currette all wax removed and tolerated    Provider: Dr. Randine  [facility]

## 2021-12-20 NOTE — Telephone Encounter (Signed)
CALL PATIENT TO SCHEDULE PROLIA INJECTION ON [place] SCHEDULE.  ?

## 2021-12-21 ENCOUNTER — Encounter: Payer: Self-pay | Admitting: Cardiovascular Disease

## 2021-12-21 ENCOUNTER — Ambulatory Visit
Admission: RE | Admit: 2021-12-21 | Discharge: 2021-12-21 | Disposition: A | Discharge status: Home / Self Care | Payer: Medicare HMO | Source: Ambulatory Visit | Attending: Internal Medicine | Admitting: Internal Medicine

## 2021-12-21 ENCOUNTER — Telehealth: Payer: Self-pay | Admitting: Internal Medicine

## 2021-12-21 ENCOUNTER — Ambulatory Visit: Payer: Medicare HMO | Admitting: Cardiovascular Disease

## 2021-12-21 VITALS — BP 134/68 | HR 70 | Ht 63.0 in | Wt 157.0 lb

## 2021-12-21 DIAGNOSIS — I679 Cerebrovascular disease, unspecified: Secondary | ICD-10-CM | POA: Diagnosis not present

## 2021-12-21 DIAGNOSIS — R2689 Other abnormalities of gait and mobility: Secondary | ICD-10-CM | POA: Diagnosis present

## 2021-12-21 DIAGNOSIS — R42 Dizziness and giddiness: Secondary | ICD-10-CM | POA: Insufficient documentation

## 2021-12-21 DIAGNOSIS — E785 Hyperlipidemia, unspecified: Secondary | ICD-10-CM | POA: Diagnosis not present

## 2021-12-21 DIAGNOSIS — R269 Unspecified abnormalities of gait and mobility: Secondary | ICD-10-CM | POA: Insufficient documentation

## 2021-12-21 DIAGNOSIS — Z8249 Family history of ischemic heart disease and other diseases of the circulatory system: Secondary | ICD-10-CM

## 2021-12-21 DIAGNOSIS — I700 Atherosclerosis of aorta: Secondary | ICD-10-CM | POA: Diagnosis not present

## 2021-12-21 DIAGNOSIS — E1169 Type 2 diabetes mellitus with other specified complication: Secondary | ICD-10-CM | POA: Diagnosis not present

## 2021-12-21 DIAGNOSIS — I10 Essential (primary) hypertension: Secondary | ICD-10-CM | POA: Diagnosis not present

## 2021-12-21 DIAGNOSIS — R531 Weakness: Secondary | ICD-10-CM | POA: Insufficient documentation

## 2021-12-21 LAB — COMPREHENSIVE METABOLIC PANEL WITH GFR
ALT: 9 U/L (ref 0–35)
AST: 12 U/L (ref 0–37)
Albumin: 4.2 g/dL (ref 3.5–5.2)
Alkaline Phosphatase: 97 U/L (ref 39–117)
BUN: 27 mg/dL — ABNORMAL HIGH (ref 6–23)
CO2: 26 meq/L (ref 19–32)
Calcium: 9.6 mg/dL (ref 8.4–10.5)
Chloride: 104 meq/L (ref 96–112)
Creatinine, Ser: 0.67 mg/dL (ref 0.40–1.20)
GFR: 82.66 mL/min
Glucose, Bld: 118 mg/dL — ABNORMAL HIGH (ref 70–99)
Potassium: 4.8 meq/L (ref 3.5–5.1)
Sodium: 139 meq/L (ref 135–145)
Total Bilirubin: 0.9 mg/dL (ref 0.2–1.2)
Total Protein: 6.4 g/dL (ref 6.0–8.3)

## 2021-12-21 LAB — CBC WITH DIFFERENTIAL/PLATELET
Basophils Absolute: 0 10*3/uL (ref 0.0–0.1)
Basophils Relative: 0.7 % (ref 0.0–3.0)
Eosinophils Absolute: 0.1 10*3/uL (ref 0.0–0.7)
Eosinophils Relative: 1.2 % (ref 0.0–5.0)
HCT: 37.6 % (ref 36.0–46.0)
Hemoglobin: 12.6 g/dL (ref 12.0–15.0)
Lymphocytes Relative: 16.5 % (ref 12.0–46.0)
Lymphs Abs: 0.9 10*3/uL (ref 0.7–4.0)
MCHC: 33.5 g/dL (ref 30.0–36.0)
MCV: 88 fl (ref 78.0–100.0)
Monocytes Absolute: 0.5 10*3/uL (ref 0.1–1.0)
Monocytes Relative: 9.5 % (ref 3.0–12.0)
Neutro Abs: 4 10*3/uL (ref 1.4–7.7)
Neutrophils Relative %: 72.1 % (ref 43.0–77.0)
Platelets: 149 10*3/uL — ABNORMAL LOW (ref 150.0–400.0)
RBC: 4.27 Mil/uL (ref 3.87–5.11)
RDW: 14.2 % (ref 11.5–15.5)
WBC: 5.6 10*3/uL (ref 4.0–10.5)

## 2021-12-21 MED ORDER — ASPIRIN EC 81 MG PO TBEC: 81.0000 mg | DELAYED_RELEASE_TABLET | Freq: Every day | ORAL | Status: AC

## 2021-12-21 NOTE — Telephone Encounter (Signed)
Patient has received approval letter. ?

## 2021-12-21 NOTE — Progress Notes (Signed)
 Cardiology Office Note  Date:  12/21/2021   ID:  [name], [name] [birth year], MRN [id number]  PCP:  Megan Verneita CROME, MD   Chief Complaint  Patient presents with   New Patient (Initial Visit)    Referred by PCP for HTN and brady. Meds reviewed verbally with patient.     HPI:  [name] is a 81 year old woman with past medical history of Hyperlipidemia Family history aneurysm Mom and sister Essential hypertension Who presents by referral from Dr. Randine Rolan Balls for symptoms of dizziness  Reports that she was in her usual state of health when she woke up at 3 AM 2 evenings ago 3 Am got up to go to the bathroom, normally has to go to the bathroom 2 or 3 times a night Each time when she woke up sat on the side of the bed and stood up she had dizziness, trouble walking Reports that she got her cane so she did not fall down Had some dizziness sitting on the toilet but seem to get somewhat better if she put her head down Symptoms seem to resolve after she would go back to bed but recurred after she stood up again Symptoms persisted all day yesterday, reported having a  very bad day Reports that she is better today, back to her baseline  She had a CT head this morning with no acute stroke She is concerned of finding of atherosclerotic calcification in the large vessels As well as family history of aneurysm Head MRA was offered by primary care, she would like this done  EKG personally reviewed by myself on todays visit Normal sinus rhythm rate 70 bpm no significant ST-T wave changes   PMH:   has a past medical history of Anxiety, Diabetes mellitus, GERD (gastroesophageal reflux disease), History of kidney stones, Hyperlipidemia, Hypertension, Hypothyroidism, Osteoporosis, Positive colorectal cancer screening using Cologuard test (2019), and Thyroid  disease.  PSH:    Past Surgical History:  Procedure Laterality Date   BREAST CYST ASPIRATION Bilateral 25+ yrs ago    CATARACT EXTRACTION W/PHACO Right 03/02/2015   Procedure: CATARACT EXTRACTION PHACO AND INTRAOCULAR LENS PLACEMENT ([facility]) TORIC LENS;  Surgeon: Dene Etienne, MD;  Location: [facility];  Service: Ophthalmology;  Laterality: Right;  TORIC   CATARACT EXTRACTION W/PHACO Left 03/30/2015   Procedure: CATARACT EXTRACTION PHACO AND INTRAOCULAR LENS PLACEMENT ([facility]);  Surgeon: Dene Etienne, MD;  Location: [facility];  Service: Ophthalmology;  Laterality: Left;  TORIC  DIABETIC - oral meds   CERVICAL POLYPECTOMY     COLONOSCOPY  01/2007   Dr Obie   COLONOSCOPY WITH PROPOFOL  N/A 10/23/2017   Procedure: COLONOSCOPY WITH PROPOFOL ;  Surgeon: Dessa Reyes ORN, MD;  Location: [facility];  Service: Endoscopy;  Laterality: N/A;   CYSTOSCOPY W/ RETROGRADES Bilateral 12/07/2019   Procedure: CYSTOSCOPY WITH RETROGRADE PYELOGRAM;  Surgeon: Penne Knee, MD;  Location: [facility];  Service: Urology;  Laterality: Bilateral;   CYSTOSCOPY/URETEROSCOPY/HOLMIUM LASER/STENT PLACEMENT Right 12/07/2019   Procedure: CYSTOSCOPY/URETEROSCOPY/HOLMIUM LASER/STENT PLACEMENT;  Surgeon: Penne Knee, MD;  Location: [facility];  Service: Urology;  Laterality: Right;   EYE SURGERY     HUMERUS FRACTURE SURGERY     KNEE ARTHROPLASTY Right 10/18/2021   Procedure: COMPUTER ASSISTED TOTAL KNEE ARTHROPLASTY;  Surgeon: Mardee Lynwood SQUIBB, MD;  Location: [facility];  Service: Orthopedics;  Laterality: Right;   TUBAL LIGATION  1982   VAGINAL DELIVERY     WRIST FRACTURE SURGERY      Current Outpatient Medications  Medication Sig Dispense Refill   ALPRAZolam  (XANAX ) 0.25 MG tablet Take 0.5 tablets (0.125 mg total) by mouth at bedtime as needed for anxiety. 15 tablet 5   atorvastatin  (LIPITOR) 20 MG tablet TAKE 1 TABLET BY MOUTH EVERY DAY 90 tablet 3   Blood Glucose Monitoring Suppl (ONE TOUCH ULTRA 2) w/Device KIT Use to check blood sugars once daily. 1 kit 0   cholecalciferol  (VITAMIN D ) 25 MCG (1000  UNIT) tablet Take 2 tablets (2,000 Units total) by mouth daily. 90 tablet 1   glipiZIDE  (GLUCOTROL ) 5 MG tablet 1/2 tablet at breakfast,  and 2 tablets at dinnertime 270 tablet 1   glucose blood (ONETOUCH ULTRA) test strip USE AS DIRECTED 100 strip 10   levothyroxine  (SYNTHROID ) 88 MCG tablet TAKE 1 TABLET BY MOUTH EVERY DAY ON EMPTY STOMACH WITH WATER AT LEAST 30-60 MINS BEFORE BREAKFAST 90 tablet 3   LINIMENTS EX Apply 1 application. topically 2 (two) times daily as needed (pain/inflammation.). Absorbine Veterinary Liniment Gel     losartan  (COZAAR ) 50 MG tablet TAKE 1 TABLET BY MOUTH EVERY DAY 90 tablet 3   metFORMIN  (GLUCOPHAGE -XR) 500 MG 24 hr tablet TAKE 1 TABLET BY MOUTH EVERY DAY WITH BREAKFAST 90 tablet 3   pioglitazone  (ACTOS ) 15 MG tablet TAKE 1 TABLET (15 MG TOTAL) BY MOUTH DAILY WITH SUPPER 90 tablet 0   Menthol , Topical Analgesic, (BIOFREEZE EX) Apply 1 application. topically 3 (three) times daily as needed (pain.). (Patient not taking: Reported on 12/21/2021)     No current facility-administered medications for this visit.    Allergies:   Latex and Nickel   Social History:  The patient  reports that she has never smoked. She has never used smokeless tobacco. She reports that she does not drink alcohol and does not use drugs.   Family History:   family history includes Aneurysm in her mother and sister; Breast cancer (age of onset: 43) in her maternal grandmother; Cancer in her father and maternal grandfather; Depression in her sister; Diabetes in her brother; Heart disease in her father and maternal grandfather; Hypertension in her brother, mother, and son; Stroke (age of onset: 78) in her mother.    Review of Systems: Review of Systems  Constitutional: Negative.   HENT: Negative.    Respiratory: Negative.    Cardiovascular: Negative.   Gastrointestinal: Negative.   Musculoskeletal: Negative.   Neurological:  Positive for dizziness.  Psychiatric/Behavioral: Negative.     All other systems reviewed and are negative.  PHYSICAL EXAM: VS:  BP 134/68 (BP Location: Left Arm, Patient Position: Sitting, Cuff Size: Normal)   Pulse 70   Ht 5' 3 (1.6 m)   Wt 157 lb (71.2 kg)   SpO2 98%   BMI 27.81 kg/m  , BMI Body mass index is 27.81 kg/m. Constitutional:  oriented to person, place, and time. No distress.  HENT:  Head: Grossly normal Eyes:  no discharge. No scleral icterus.  Neck: No JVD, no carotid bruits  Cardiovascular: Regular rate and rhythm, no murmurs appreciated Pulmonary/Chest: Clear to auscultation bilaterally, no wheezes or rails Abdominal: Soft.  no distension.  no tenderness.  Musculoskeletal: Normal range of motion Neurological:  normal muscle tone. Coordination normal. No atrophy Skin: Skin warm and dry Psychiatric: normal affect, pleasant  Recent Labs: 08/22/2021: TSH 2.13 12/20/2021: ALT 9; BUN 27; Creatinine, Ser 0.67; Hemoglobin 12.6; Platelets 149.0; Potassium 4.8; Sodium 139    Lipid Panel Lab Results  Component Value Date   CHOL 140 08/22/2021  HDL 59.00 08/22/2021   LDLCALC 55 08/22/2021   TRIG 127.0 08/22/2021      Wt Readings from Last 3 Encounters:  12/21/21 157 lb (71.2 kg)  10/18/21 159 lb 9.6 oz (72.4 kg)  10/05/21 159 lb 9.6 oz (72.4 kg)     ASSESSMENT AND PLAN:  Problem List Items Addressed This Visit       Cardiology Problems   Hyperlipidemia associated with type 2 diabetes mellitus (HCC)   Essential hypertension   Aortic arch atherosclerosis (HCC) - Primary   Dizziness Etiology unclear, unable to exclude small stroke/TIA CT scan reviewed, calcified plaque noted Now back to her normal baseline without symptoms Recommend she restart her aspirin  81 mg daily MRA head neck offered by primary care, we will order this at patient's request Family history of cerebrovascular disease, aneurysm We have recommended head and neck MRA If she has recurrent symptoms recommended that she check her blood  pressure Blood pressure stable on today's visit, EKG normal Cholesterol treated, non-smoker, no diabetes   Total encounter time more than 60 minutes  Greater than 50% was spent in counseling and coordination of care with the patient    Signed, Velinda Lunger, M.D., Ph.D. [facility] [place], [place] [phone]

## 2021-12-21 NOTE — Telephone Encounter (Signed)
The patient has been scheduled for her Prolia injection on 12/26/21 ?

## 2021-12-21 NOTE — Patient Instructions (Addendum)
Medication Instructions:  ?- Your physician has recommended you make the following change in your medication:  ? ?1) START aspirin 81 mg daily ? ?If you need a refill on your cardiac medications before your next appointment, please call your pharmacy.  ? ?Lab work: ?No new labs needed ? ?Testing/Procedures: ?No new testing needed ? ?Follow-Up: ?At [facility], you and your health needs are our priority.  As part of our continuing mission to provide you with exceptional heart care, we have created designated Provider Care Teams.  These Care Teams include your primary Cardiologist (physician) and Advanced Practice Providers (APPs -  Physician Assistants and Nurse Practitioners) who all work together to provide you with the care you need, when you need it. ? ?You will need a follow up appointment as needed ? ?Providers on your designated Care Team:   ?Murray Hodgkins, NP ?Christell Faith, PA-C ?Cadence Kathlen Mody, PA-C ? ?COVID-19 Vaccine Information can be found at: [email or web address].uk For questions related to vaccine distribution or appointments, please email [email or web address] or call 2032444953.  ? ? ?

## 2021-12-21 NOTE — Telephone Encounter (Signed)
Pt returning call

## 2021-12-22 LAB — URINALYSIS, ROUTINE W REFLEX MICROSCOPIC
Bacteria, UA: NONE SEEN /HPF
Bilirubin Urine: NEGATIVE
Glucose, UA: NEGATIVE
Hgb urine dipstick: NEGATIVE
Hyaline Cast: NONE SEEN /LPF
Ketones, ur: NEGATIVE
Nitrite: NEGATIVE
Protein, ur: NEGATIVE
RBC / HPF: NONE SEEN /HPF (ref 0–2)
Specific Gravity, Urine: 1.012 (ref 1.001–1.035)
Squamous Epithelial / HPF: NONE SEEN /HPF
WBC, UA: NONE SEEN /HPF (ref 0–5)
pH: 5.5 (ref 5.0–8.0)

## 2021-12-22 LAB — URINE CULTURE
MICRO NUMBER:: 13290553
Result:: NO GROWTH
SPECIMEN QUALITY:: ADEQUATE

## 2021-12-22 LAB — MICROSCOPIC MESSAGE

## 2021-12-23 ENCOUNTER — Encounter: Payer: Self-pay | Admitting: Internal Medicine

## 2021-12-25 MED ORDER — MECLIZINE HCL 25 MG PO TABS: 12.5000 mg | ORAL_TABLET | Freq: Two times a day (BID) | ORAL | 2 refills | Status: DC | PRN

## 2021-12-25 NOTE — Telephone Encounter (Signed)
See image result note.  ?

## 2021-12-25 NOTE — Addendum Note (Signed)
Addended by: Orland Mustard on: 12/25/2021 08:50 AM ? ? Modules accepted: Orders ? ?

## 2021-12-25 NOTE — Addendum Note (Signed)
Addended by: Orland Mustard on: 12/25/2021 02:23 PM ? ? Modules accepted: Orders ? ?

## 2021-12-26 ENCOUNTER — Ambulatory Visit (INDEPENDENT_AMBULATORY_CARE_PROVIDER_SITE_OTHER): Payer: Medicare HMO

## 2021-12-26 DIAGNOSIS — M810 Age-related osteoporosis without current pathological fracture: Secondary | ICD-10-CM | POA: Diagnosis not present

## 2021-12-26 MED ORDER — DENOSUMAB 60 MG/ML ~~LOC~~ SOSY
60.0000 mg | PREFILLED_SYRINGE | Freq: Once | SUBCUTANEOUS | Status: AC
  Administered 2021-12-26: 60 mg via SUBCUTANEOUS

## 2021-12-26 NOTE — Progress Notes (Signed)
Patient came in to receive Prolia injection today in right arm SQ. Patient tolerated well with no signs of distress.  ?

## 2021-12-27 ENCOUNTER — Telehealth: Payer: Self-pay | Admitting: *Deleted

## 2021-12-27 NOTE — Telephone Encounter (Signed)
-----   Message from Minna Merritts, MD sent at 12/27/2021  9:00 AM EDT ----- ?Thank you Nira Conn,  ?We can cancel our testing orders ?TGollan ?----- Message ----- ?From: McLean-Scocuzza, Nino Glow, MD ?Sent: 12/26/2021   3:48 PM EDT ?To: Emily Filbert, RN, Minna Merritts, MD, # ? ?Ive ordered these tests  ?We will schedule them ?Thank you ? ? ?----- Message ----- ?From: Emily Filbert, RN ?Sent: 12/26/2021   3:21 PM EDT ?To: Emily Filbert, RN, Minna Merritts, MD, # ? ?Good afternoon Dr. Terese Door, ? ?I'm one of the [facility] nurses with Dr. Rockey Situ. ? ?He saw this very nice lady on 4/20 and touched base with Dr. Derrel Nip after her appointment to inquire about MRI scanning, which she deferred to Dr. Rockey Situ to arrange as she had not seen the patient.  ? ?We had placed orders for 1) MR Angio of the head w/ w/o contrast & 2) MR Angio of the neck w/ w/o contrast. ?I had advised the patient on Thursday I would be working on trying to get these scheduled for her. ? ?I was in her chart earlier today and saw that there had been some mychart messages back to you/ your office from the patient and mention that you would order further testing for her. ? ?I now see orders you have placed for 1) Carotid US, 2) MR angio head w/o contrast, 3) MR brain w/o contrast  ? ?I have messaged the patient to see if she would prefer you order and follow up on testing for her, but Dr. Rockey Situ also advised I reach out to you to get your thoughts on this as well. ? ?We appreciate your feedback. ? ?Thank you! ?Nira Conn, RN  ? ? ? ?

## 2021-12-27 NOTE — Telephone Encounter (Signed)
MRI's ordered by Dr. Rockey Situ have been cancelled. ? ?

## 2022-01-08 ENCOUNTER — Ambulatory Visit
Admission: RE | Admit: 2022-01-08 | Discharge: 2022-01-08 | Disposition: A | Discharge status: Home / Self Care | Payer: Medicare HMO | Source: Ambulatory Visit | Attending: Internal Medicine | Admitting: Internal Medicine

## 2022-01-08 DIAGNOSIS — R42 Dizziness and giddiness: Secondary | ICD-10-CM

## 2022-01-08 DIAGNOSIS — I6782 Cerebral ischemia: Secondary | ICD-10-CM | POA: Diagnosis not present

## 2022-01-08 DIAGNOSIS — Z8249 Family history of ischemic heart disease and other diseases of the circulatory system: Secondary | ICD-10-CM | POA: Insufficient documentation

## 2022-01-08 DIAGNOSIS — R531 Weakness: Secondary | ICD-10-CM | POA: Diagnosis not present

## 2022-01-08 DIAGNOSIS — R269 Unspecified abnormalities of gait and mobility: Secondary | ICD-10-CM

## 2022-01-08 DIAGNOSIS — R2689 Other abnormalities of gait and mobility: Secondary | ICD-10-CM

## 2022-01-09 ENCOUNTER — Encounter: Payer: Self-pay | Admitting: Internal Medicine

## 2022-01-09 DIAGNOSIS — Z8673 Personal history of transient ischemic attack (TIA), and cerebral infarction without residual deficits: Secondary | ICD-10-CM | POA: Insufficient documentation

## 2022-01-09 DIAGNOSIS — I639 Cerebral infarction, unspecified: Secondary | ICD-10-CM | POA: Insufficient documentation

## 2022-01-09 NOTE — Addendum Note (Signed)
Addended by: Orland Mustard on: 01/09/2022 05:28 PM ? ? Modules accepted: Orders ? ?

## 2022-01-15 ENCOUNTER — Ambulatory Visit: Payer: Medicare HMO | Admitting: Neurology

## 2022-01-15 ENCOUNTER — Encounter: Payer: Self-pay | Admitting: Neurology

## 2022-01-15 VITALS — BP 177/69 | HR 51 | Ht 63.0 in | Wt 159.5 lb

## 2022-01-15 DIAGNOSIS — R42 Dizziness and giddiness: Secondary | ICD-10-CM | POA: Insufficient documentation

## 2022-01-15 DIAGNOSIS — R519 Headache, unspecified: Secondary | ICD-10-CM | POA: Diagnosis not present

## 2022-01-15 DIAGNOSIS — R799 Abnormal finding of blood chemistry, unspecified: Secondary | ICD-10-CM | POA: Diagnosis not present

## 2022-01-15 DIAGNOSIS — I679 Cerebrovascular disease, unspecified: Secondary | ICD-10-CM | POA: Diagnosis not present

## 2022-01-15 DIAGNOSIS — E538 Deficiency of other specified B group vitamins: Secondary | ICD-10-CM | POA: Diagnosis not present

## 2022-01-15 DIAGNOSIS — R700 Elevated erythrocyte sedimentation rate: Secondary | ICD-10-CM | POA: Diagnosis not present

## 2022-01-15 DIAGNOSIS — R7989 Other specified abnormal findings of blood chemistry: Secondary | ICD-10-CM | POA: Diagnosis not present

## 2022-01-15 DIAGNOSIS — R0683 Snoring: Secondary | ICD-10-CM | POA: Insufficient documentation

## 2022-01-15 NOTE — Progress Notes (Signed)
Chief Complaint  Patient presents with   Follow-up    Rm 14. Accompanied by daughter, Arby Barrette. NP internal referral for Balance problem, abnormal gait, vertigo, CVA.      ASSESSMENT AND PLAN  [name] is a 81 y.o. 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 Laboratory evaluations in April 2023, normal CBC hemoglobin of 12.6, CMP, creatinine of 0.67, mild elevated glucose 119, A1C 5.9. LDL 55,  Vit D 29,  TSH.    MEDICAL HISTORY:  [name], is a 81 year old female seen in request by her primary care physician Dr. Terese Door, Nino Glow, for evaluation of balance issues, she is accompanied by her daughter Arby Barrette at today's visit on Jan 15, 2022  I reviewed and summarized the referring note.PMHX. HLD DM Hypothyroidism HTN Anxiety Right Knee replacement on Feb   She lives with her daughter and granddaughter, 3 weeks ago end of April 2023, she was getting up in the middle of the night using bathroom, she felt sweaty, unsteady, swimmy headed, she has  to knock on the wall to get attention of her granddaughter to come to help her, who was able to help her sit at the edge of the bed for few minutes, then she is better,  Since then, she had intermittent symptoms, usually happening to with sudden positional change, such as getting up from lying down, or seated position, denies spinning sensation, denies hearing loss, no new onset language difficulty,  I personally reviewed MRI of the brain on Jan 09, 2022, moderate small vessel disease, no acute abnormality, subcentimeter chronic infarction within the right cerebellar hemisphere, generalized atrophy MRA of the brain showed no large vessel disease,   PHYSICAL EXAM:   Vitals:   01/15/22 0929  BP: (!) 177/69  Pulse: (!) 51  Weight: 159 lb 8 oz (72.3 kg)  Height: _0  (1.6 m)   Sitting down 167/66, 50, Standing up 134/77,57, 163/76, 56   Body mass index is 28.25 kg/m.  PHYSICAL EXAMNIATION:  Gen: NAD, conversant, well nourised, well groomed                     Cardiovascular: Regular rate rhythm, no peripheral edema, warm, nontender. Eyes: Conjunctivae clear without exudates or hemorrhage Neck: Supple, no carotid bruits. Pulmonary: Clear to auscultation bilaterally   NEUROLOGICAL EXAM:  MENTAL STATUS: Speech/cognition: Awake, alert, oriented to history taking and casual conversation CRANIAL NERVES: CN II: Visual fields are full to confrontation. Pupils are round equal and briskly reactive to light. CN III, IV, VI: extraocular movement are normal. No ptosis. CN V: Facial sensation is intact to light touch CN VII: Face is symmetric with normal  eye closure  CN VIII: Hearing is normal to causal conversation. CN IX, X: Phonation is normal. CN XI: Head turning and shoulder shrug are intact  MOTOR: There is no pronator drift of out-stretched arms. Muscle bulk and tone are normal. Muscle strength is normal.  REFLEXES: Reflexes are 2+ and symmetric at the biceps, triceps, knees,  and trace at ankles. Plantar responses are flexor.  SENSORY: Intact to light touch, pinprick and vibratory sensation are intact in fingers and toes.  COORDINATION: There is no trunk or limb dysmetria noted.  GAIT/STANCE: Need push-up to get up from seated position, careful, mildly antalgic,  REVIEW OF SYSTEMS:  Full 14 system review of systems performed and notable only for as above All other review of systems were negative.   ALLERGIES: Allergies  Allergen Reactions   Latex Rash    IgE < 0.10 (WNL) on 10/05/2021   Nickel Rash    HOME MEDICATIONS: Current Outpatient Medications  Medication Sig Dispense Refill   ALPRAZolam (XANAX) 0.25 MG tablet Take 0.5 tablets (0.125 mg total) by mouth at bedtime as needed for anxiety. 15 tablet 5   aspirin EC 81 MG tablet Take 1 tablet (81 mg total) by mouth daily. Swallow whole.     atorvastatin (LIPITOR) 20 MG tablet TAKE 1 TABLET BY MOUTH EVERY DAY 90 tablet 3   Blood Glucose Monitoring Suppl (ONE TOUCH ULTRA 2) w/Device KIT Use to check blood sugars once daily. 1 kit 0   cholecalciferol (VITAMIN D) 25 MCG (1000 UNIT) tablet Take 2 tablets (2,000 Units total) by mouth daily. 90 tablet 1   glipiZIDE (GLUCOTROL) 5 MG tablet 1/2 tablet at breakfast,  and 2 tablets at dinnertime 270 tablet 1   glucose blood (ONETOUCH ULTRA) test strip USE AS DIRECTED 100 strip 10   levothyroxine (SYNTHROID) 88 MCG tablet TAKE 1 TABLET BY MOUTH EVERY DAY ON EMPTY STOMACH WITH WATER AT LEAST 30-60 MINS BEFORE BREAKFAST 90 tablet 3   losartan (COZAAR) 50 MG tablet TAKE 1 TABLET BY MOUTH EVERY DAY 90 tablet 3   meclizine (ANTIVERT) 25 MG tablet Take 0.5-1 tablets (12.5-25 mg total) by mouth 2 (two) times daily as needed for dizziness. 60 tablet 2   metFORMIN (GLUCOPHAGE-XR) 500 MG 24 hr tablet TAKE 1 TABLET BY MOUTH EVERY DAY WITH BREAKFAST 90 tablet 3   pioglitazone (ACTOS) 15 MG tablet TAKE 1 TABLET (15 MG TOTAL) BY MOUTH DAILY WITH SUPPER 90 tablet 0   No current  facility-administered medications for this visit.    PAST MEDICAL HISTORY: Past Medical History:  Diagnosis Date   Anxiety    Diabetes mellitus    GERD (gastroesophageal reflux disease)    History of kidney stones    Hyperlipidemia    Hypertension    Hypothyroidism    Osteoporosis    Positive colorectal cancer screening using Cologuard test 2019   Thyroid disease     PAST SURGICAL HISTORY: Past Surgical History:  Procedure Laterality Date   BREAST CYST ASPIRATION Bilateral 25+ yrs ago   CATARACT EXTRACTION W/PHACO Right 03/02/2015   Procedure: CATARACT EXTRACTION PHACO AND INTRAOCULAR LENS PLACEMENT ([place]) TORIC LENS;  Surgeon: Leandrew Koyanagi, MD;  Location: [facility];  Service: Ophthalmology;  Laterality: Right;  TORIC   CATARACT EXTRACTION W/PHACO Left 03/30/2015   Procedure: CATARACT EXTRACTION PHACO AND INTRAOCULAR LENS PLACEMENT (IOC);  Surgeon: Leandrew Koyanagi, MD;  Location: [facility];  Service: Ophthalmology;  Laterality: Left;  TORIC  DIABETIC - oral meds   CERVICAL  POLYPECTOMY     COLONOSCOPY  01/2007   Dr Olevia Perches   COLONOSCOPY WITH PROPOFOL N/A 10/23/2017   Procedure: COLONOSCOPY WITH PROPOFOL;  Surgeon: Robert Bellow, MD;  Location: [facility];  Service: Endoscopy;  Laterality: N/A;   CYSTOSCOPY W/ RETROGRADES Bilateral 12/07/2019   Procedure: CYSTOSCOPY WITH RETROGRADE PYELOGRAM;  Surgeon: Hollice Espy, MD;  Location: [facility];  Service: Urology;  Laterality: Bilateral;   CYSTOSCOPY/URETEROSCOPY/HOLMIUM LASER/STENT PLACEMENT Right 12/07/2019   Procedure: CYSTOSCOPY/URETEROSCOPY/HOLMIUM LASER/STENT PLACEMENT;  Surgeon: Hollice Espy, MD;  Location: [facility];  Service: Urology;  Laterality: Right;   EYE SURGERY     HUMERUS FRACTURE SURGERY     KNEE ARTHROPLASTY Right 10/18/2021   Procedure: COMPUTER ASSISTED TOTAL KNEE ARTHROPLASTY;  Surgeon: Dereck Leep, MD;  Location: [facility];  Service: Orthopedics;  Laterality: Right;    TUBAL LIGATION  1982   VAGINAL DELIVERY     WRIST FRACTURE SURGERY      FAMILY HISTORY: Family History  Problem Relation Age of Onset   Hypertension Mother    Stroke Mother 52       hemorrhagic   Aneurysm Mother    Heart disease Father    Cancer Father        Lung CA,  died of AMI while in [place] getting Laetr[place]   Aneurysm Sister    Depression Sister    Diabetes Brother    Hypertension Brother    Breast cancer Maternal Grandmother 27   Heart disease Maternal Grandfather    Cancer Maternal Grandfather    Hypertension Son     SOCIAL HISTORY: Social History   Socioeconomic History   Marital status: Widowed    Spouse name: Not on file   Number of children: 2   Years of education: Not on file   Highest education level: Not on file  Occupational History   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 Lost brother also. Has living will. Son and daughter hold health care POA. Would accept resuscitation but no prolonged artificial ventilation. Not sure about feeding tube.    Social Determinants of Health   Financial Resource Strain: Not on file  Food Insecurity: Not on file  Transportation Needs: Not on file  Physical Activity: Not on file  Stress: Not on file  Social Connections: Not on file  Intimate Partner Violence: Not on file      Marcial Pacas, M.D. Ph.D.  [facility] [address] [place], [place] [address] Ph: [phone] Fax: [phone] Accession [id number]) (Order [id number]) CC:  McLean-Scocuzza, Nino Glow, MD [address] [place],  [place] [address]  Crecencio Mc, MD

## 2022-01-16 LAB — TSH: TSH: 2.55 u[IU]/mL (ref 0.450–4.500)

## 2022-01-16 LAB — FERRITIN: Ferritin: 72 ng/mL (ref 15–150)

## 2022-01-16 LAB — VITAMIN B12: Vitamin B-12: 237 pg/mL (ref 232–1245)

## 2022-01-16 LAB — SEDIMENTATION RATE: Sed Rate: 2 mm/h (ref 0–40)

## 2022-01-18 NOTE — Progress Notes (Signed)
Ok noted I did not refer to sleep center neurology did pt declined

## 2022-01-26 ENCOUNTER — Ambulatory Visit
Admission: RE | Admit: 2022-01-26 | Discharge: 2022-01-26 | Disposition: A | Discharge status: Home / Self Care | Payer: Medicare HMO | Attending: Urology | Admitting: Urology

## 2022-01-26 ENCOUNTER — Other Ambulatory Visit: Payer: Self-pay | Admitting: Urology

## 2022-01-26 ENCOUNTER — Encounter: Payer: Self-pay | Admitting: Urology

## 2022-01-26 ENCOUNTER — Ambulatory Visit
Admission: RE | Admit: 2022-01-26 | Discharge: 2022-01-26 | Disposition: A | Discharge status: Home / Self Care | Payer: Medicare HMO | Source: Ambulatory Visit | Attending: Urology | Admitting: Urology

## 2022-01-26 ENCOUNTER — Ambulatory Visit: Payer: Medicare HMO | Admitting: Urology

## 2022-01-26 ENCOUNTER — Other Ambulatory Visit: Payer: Self-pay

## 2022-01-26 VITALS — BP 176/76 | HR 52 | Ht 63.0 in | Wt 162.0 lb

## 2022-01-26 DIAGNOSIS — R319 Hematuria, unspecified: Secondary | ICD-10-CM

## 2022-01-26 DIAGNOSIS — N200 Calculus of kidney: Secondary | ICD-10-CM

## 2022-01-26 DIAGNOSIS — Z87442 Personal history of urinary calculi: Secondary | ICD-10-CM

## 2022-01-26 DIAGNOSIS — M5136 Other intervertebral disc degeneration, lumbar region: Secondary | ICD-10-CM | POA: Diagnosis not present

## 2022-01-26 LAB — URINALYSIS, COMPLETE
Bilirubin, UA: NEGATIVE
Glucose, UA: NEGATIVE
Ketones, UA: NEGATIVE
Nitrite, UA: NEGATIVE
Protein,UA: NEGATIVE
Specific Gravity, UA: <1.005 — ABNORMAL LOW (ref 1.005–1.030)
Urobilinogen, Ur: 0.2 mg/dL (ref 0.2–1.0)
pH, UA: 5.5 (ref 5.0–7.5)

## 2022-01-26 LAB — MICROSCOPIC EXAMINATION: WBC, UA: >30 /HPF — AB (ref 0–5)

## 2022-01-26 NOTE — Addendum Note (Signed)
Addended by: Evelina Bucy on: 01/26/2022 01:20 PM   Modules accepted: Orders

## 2022-01-26 NOTE — Progress Notes (Signed)
01/26/22 10:09 AM   [name] Jan 27, 1941 [id number]  Referring provider:  Crecencio Mc, MD [place],  [place] [address]   Urological history  Urological history  1. High risk hematuria  -former smoker  -cysto w/ retrogrades, 12/2019 - 7 mm hard right renal pelvic stone.  Normal bilateral retrograde pyelograms.  Bladder unremarkable  -no reports of gross heme  -UA pending  2. Nephrolithiasis  -right URS, 12/2019  -CT, 2021 - 2 mm nonobstructing right upper pole renal calculus (coronal image  90). Two nonobstructing right lower pole renal calculi measuring up  to 3 mm (series 2/images 94-95). Additional 2 mm nonobstructing left  lower pole renal calculus (coronal image 68). No hydronephrosis.   3. Renal cyst  -CT, 2021 - Dominant 6.2 x 5.4 cm left upper pole renal cyst (series 15/image  82), benign (Bosniak I). Additional small bilateral renal cysts  measuring up to 12 mm in the lateral left lower pole (series  15/image 91), benign (Bosniak I). No enhancing renal lesions.     Chief Complaint  Patient presents with   Nephrolithiasis       HPI: [name] is a 81 y.o.female who presents today for follow-up on kidney stones with her daughter, [name].    She reports today that she no longer experiences blood when she wipes. She had pain in her right lower quadrant that started about 3 weeks ago.   She was unable to give an UA at the time of this visit.    KUB today did not demonstrate any calculi, but renal contours obscured by stool contents.   Patient denies any modifying or aggravating factors.  Patient denies any gross hematuria, dysuria or suprapubic/flank pain.  Patient denies any fevers, chills, nausea or vomiting.    PMH: Past Medical History:  Diagnosis Date   Anxiety    Diabetes mellitus    GERD (gastroesophageal reflux disease)    History of kidney stones    Hyperlipidemia    Hypertension    Hypothyroidism     Osteoporosis    Positive colorectal cancer screening using Cologuard test 2019   Thyroid disease     Surgical History: Past Surgical History:  Procedure Laterality Date   BREAST CYST ASPIRATION Bilateral 25+ yrs ago   CATARACT EXTRACTION W/PHACO Right 03/02/2015   Procedure: CATARACT EXTRACTION PHACO AND INTRAOCULAR LENS PLACEMENT ([place]) TORIC LENS;  Surgeon: Leandrew Koyanagi, MD;  Location: [place];  Service: Ophthalmology;  Laterality: Right;  TORIC   CATARACT EXTRACTION W/PHACO Left 03/30/2015   Procedure: CATARACT EXTRACTION PHACO AND INTRAOCULAR LENS PLACEMENT ([facility]);  Surgeon: Leandrew Koyanagi, MD;  Location: [place];  Service: Ophthalmology;  Laterality: Left;  TORIC  DIABETIC - oral meds   CERVICAL POLYPECTOMY     COLONOSCOPY  01/2007   Dr Olevia Perches   COLONOSCOPY WITH PROPOFOL N/A 10/23/2017   Procedure: COLONOSCOPY WITH PROPOFOL;  Surgeon: Robert Bellow, MD;  Location: [facility];  Service: Endoscopy;  Laterality: N/A;   CYSTOSCOPY W/ RETROGRADES Bilateral 12/07/2019   Procedure: CYSTOSCOPY WITH RETROGRADE PYELOGRAM;  Surgeon: Hollice Espy, MD;  Location: [facility];  Service: Urology;  Laterality: Bilateral;   CYSTOSCOPY/URETEROSCOPY/HOLMIUM LASER/STENT PLACEMENT Right 12/07/2019   Procedure: CYSTOSCOPY/URETEROSCOPY/HOLMIUM LASER/STENT PLACEMENT;  Surgeon: Hollice Espy, MD;  Location: [facility];  Service: Urology;  Laterality: Right;   EYE SURGERY     HUMERUS FRACTURE SURGERY     KNEE ARTHROPLASTY Right 10/18/2021   Procedure: COMPUTER ASSISTED TOTAL KNEE  ARTHROPLASTY;  Surgeon: Dereck Leep, MD;  Location: [facility];  Service: Orthopedics;  Laterality: Right;   TUBAL LIGATION  1982   VAGINAL DELIVERY     WRIST FRACTURE SURGERY      Home Medications:  Allergies as of 01/26/2022       Reactions   Latex Rash   IgE < 0.10 (WNL) on 10/05/2021   Nickel Rash        Medication List        Accurate as of Jan 26, 2022 10:09 AM. If you  have any questions, ask your nurse or doctor.          ALPRAZolam 0.25 MG tablet Commonly known as: XANAX Take 0.5 tablets (0.125 mg total) by mouth at bedtime as needed for anxiety.   aspirin EC 81 MG tablet Take 1 tablet (81 mg total) by mouth daily. Swallow whole.   atorvastatin 20 MG tablet Commonly known as: LIPITOR TAKE 1 TABLET BY MOUTH EVERY DAY   cholecalciferol 25 MCG (1000 UNIT) tablet Commonly known as: VITAMIN D Take 2 tablets (2,000 Units total) by mouth daily.   glipiZIDE 5 MG tablet Commonly known as: GLUCOTROL 1/2 tablet at breakfast,  and 2 tablets at dinnertime   levothyroxine 88 MCG tablet Commonly known as: SYNTHROID TAKE 1 TABLET BY MOUTH EVERY DAY ON EMPTY STOMACH WITH WATER AT LEAST 30-60 MINS BEFORE BREAKFAST   losartan 50 MG tablet Commonly known as: COZAAR TAKE 1 TABLET BY MOUTH EVERY DAY   meclizine 25 MG tablet Commonly known as: ANTIVERT Take 0.5-1 tablets (12.5-25 mg total) by mouth 2 (two) times daily as needed for dizziness.   metFORMIN 500 MG 24 hr tablet Commonly known as: GLUCOPHAGE-XR TAKE 1 TABLET BY MOUTH EVERY DAY WITH BREAKFAST   ONE TOUCH ULTRA 2 w/Device Kit Use to check blood sugars once daily.   OneTouch Ultra test strip Generic drug: glucose blood USE AS DIRECTED   pioglitazone 15 MG tablet Commonly known as: ACTOS TAKE 1 TABLET (15 MG TOTAL) BY MOUTH DAILY WITH SUPPER        Allergies:  Allergies  Allergen Reactions   Latex Rash    IgE < 0.10 (WNL) on 10/05/2021   Nickel Rash    Family History: Family History  Problem Relation Age of Onset   Hypertension Mother    Stroke Mother 51       hemorrhagic   Aneurysm Mother    Heart disease Father    Cancer Father        Lung CA,  died of AMI while in [place] getting Laetril   Aneurysm Sister    Depression Sister    Diabetes Brother    Hypertension Brother    Breast cancer Maternal Grandmother 22   Heart disease Maternal Grandfather    Cancer Maternal  Grandfather    Hypertension Son     Social History:  reports that she has never smoked. She has never used smokeless tobacco. She reports that she does not drink alcohol and does not use drugs.   Physical Exam: BP (!) 176/76   Pulse (!) 52   Ht _0  (1.6 m)   Wt 162 lb (73.5 kg)   BMI 28.70 kg/m   Constitutional:  Well nourished. Alert and oriented, No acute distress. HEENT: [place] AT, moist mucus membranes.  Trachea midline Cardiovascular: No clubbing, cyanosis, or edema. Respiratory: Normal respiratory effort, no increased work of breathing. Neurologic: Grossly intact, no focal deficits, moving all 4 extremities.  Psychiatric: Normal mood and affect.    Laboratory Data: Lab Results  Component Value Date   CREATININE 0.67 12/20/2021    Lab Results  Component Value Date   HGBA1C 5.9 (H) 10/05/2021   Assessment & Plan:    Nephrolithiasis  - No stones visualized on KUB today may be obscured by stool and bowel  - Advised patient to start stool softener   2. High risk hematuria - work  up 2021 - nephrolithiasis - Denies any gross hematuria - UA is pending  Return in about 1 year (around 01/27/2023) for UA and KUB.  [facility] [address], [place], [place] [address] [phone]  I, Kirke Shaggy Littlejohn,acting as a scribe for [facility], PA-C.,have documented all relevant documentation on the behalf of Vickey Boak, PA-C,as directed by  [facility], PA-C while in the presence of [place], PA-C. I have reviewed the above documentation for accuracy and completeness, and I agree with the above.    Zara Council, PA-C

## 2022-01-29 LAB — CULTURE, URINE COMPREHENSIVE

## 2022-01-30 ENCOUNTER — Telehealth: Payer: Self-pay

## 2022-01-30 NOTE — Telephone Encounter (Signed)
Notified pt as advised, pt expressed understanding.  ?

## 2022-01-30 NOTE — Telephone Encounter (Signed)
-----   Message from Nori Riis, PA-C sent at 01/29/2022  2:07 PM EDT ----- Please let Mrs. Godown know that her urinalysis did not show any microscopic blood in her urine culture just grew out normal vaginal flora, so we do not need to treat with antibiotics.  We will see her next year.

## 2022-02-15 ENCOUNTER — Ambulatory Visit (INDEPENDENT_AMBULATORY_CARE_PROVIDER_SITE_OTHER): Payer: Medicare HMO

## 2022-02-15 DIAGNOSIS — I679 Cerebrovascular disease, unspecified: Secondary | ICD-10-CM

## 2022-02-15 DIAGNOSIS — R42 Dizziness and giddiness: Secondary | ICD-10-CM | POA: Diagnosis not present

## 2022-02-15 DIAGNOSIS — R0683 Snoring: Secondary | ICD-10-CM

## 2022-02-15 DIAGNOSIS — R519 Headache, unspecified: Secondary | ICD-10-CM | POA: Diagnosis not present

## 2022-02-19 ENCOUNTER — Other Ambulatory Visit: Payer: Self-pay | Admitting: Internal Medicine

## 2022-02-19 MED ORDER — GLIPIZIDE 5 MG PO TABS: ORAL_TABLET | ORAL | 1 refills | Status: DC

## 2022-02-19 NOTE — Telephone Encounter (Signed)
Pt stated she needing a new rx written for Glipizide the direction that we have in her chart is not how she is taking her medication. Pt stated that she is taking Glipizide 5 mg BID. Is it okay to refill with those directions?

## 2022-02-19 NOTE — Telephone Encounter (Signed)
Pt called in requesting refill on medication (1/2 tablet at breakfast, and 2 tablets at dinnertime)... Pt stated that the direction on the medication is incorrect... Pt stated that Dr. Derrel Nip change her to taking 1 tablet at breakfast and 1 tablet at dinner... Pt stated that pharmacy will not refill medication until direction is change...  Pt requesting callback.Marland KitchenMarland Kitchen

## 2022-02-22 ENCOUNTER — Other Ambulatory Visit: Payer: Self-pay | Admitting: Internal Medicine

## 2022-03-02 ENCOUNTER — Ambulatory Visit: Payer: Medicare HMO | Admitting: Internal Medicine

## 2022-03-05 ENCOUNTER — Encounter: Payer: Self-pay | Admitting: Internal Medicine

## 2022-03-05 ENCOUNTER — Ambulatory Visit (INDEPENDENT_AMBULATORY_CARE_PROVIDER_SITE_OTHER): Payer: Medicare HMO | Admitting: Internal Medicine

## 2022-03-05 VITALS — BP 144/68 | HR 70 | Temp 98.1°F | Ht 63.0 in | Wt 159.2 lb

## 2022-03-05 DIAGNOSIS — I10 Essential (primary) hypertension: Secondary | ICD-10-CM

## 2022-03-05 DIAGNOSIS — E538 Deficiency of other specified B group vitamins: Secondary | ICD-10-CM | POA: Insufficient documentation

## 2022-03-05 DIAGNOSIS — E785 Hyperlipidemia, unspecified: Secondary | ICD-10-CM

## 2022-03-05 DIAGNOSIS — Z8673 Personal history of transient ischemic attack (TIA), and cerebral infarction without residual deficits: Secondary | ICD-10-CM

## 2022-03-05 DIAGNOSIS — E118 Type 2 diabetes mellitus with unspecified complications: Secondary | ICD-10-CM | POA: Diagnosis not present

## 2022-03-05 DIAGNOSIS — E559 Vitamin D deficiency, unspecified: Secondary | ICD-10-CM

## 2022-03-05 DIAGNOSIS — E1169 Type 2 diabetes mellitus with other specified complication: Secondary | ICD-10-CM

## 2022-03-05 DIAGNOSIS — R0683 Snoring: Secondary | ICD-10-CM

## 2022-03-05 DIAGNOSIS — G4769 Other sleep related movement disorders: Secondary | ICD-10-CM | POA: Diagnosis not present

## 2022-03-05 DIAGNOSIS — I679 Cerebrovascular disease, unspecified: Secondary | ICD-10-CM

## 2022-03-05 LAB — LIPID PANEL
Cholesterol: 133 mg/dL (ref 0–200)
HDL: 53.2 mg/dL
LDL Cholesterol: 57 mg/dL (ref 0–99)
NonHDL: 79.36
Total CHOL/HDL Ratio: 2
Triglycerides: 114 mg/dL (ref 0.0–149.0)
VLDL: 22.8 mg/dL (ref 0.0–40.0)

## 2022-03-05 LAB — COMPREHENSIVE METABOLIC PANEL WITH GFR
ALT: 9 U/L (ref 0–35)
AST: 13 U/L (ref 0–37)
Albumin: 4.3 g/dL (ref 3.5–5.2)
Alkaline Phosphatase: 79 U/L (ref 39–117)
BUN: 31 mg/dL — ABNORMAL HIGH (ref 6–23)
CO2: 26 meq/L (ref 19–32)
Calcium: 9.6 mg/dL (ref 8.4–10.5)
Chloride: 102 meq/L (ref 96–112)
Creatinine, Ser: 0.78 mg/dL (ref 0.40–1.20)
GFR: 71.73 mL/min
Glucose, Bld: 128 mg/dL — ABNORMAL HIGH (ref 70–99)
Potassium: 4.3 meq/L (ref 3.5–5.1)
Sodium: 141 meq/L (ref 135–145)
Total Bilirubin: 0.5 mg/dL (ref 0.2–1.2)
Total Protein: 6.7 g/dL (ref 6.0–8.3)

## 2022-03-05 LAB — LDL CHOLESTEROL, DIRECT: Direct LDL: 65 mg/dL

## 2022-03-05 LAB — VITAMIN D 25 HYDROXY (VIT D DEFICIENCY, FRACTURES): VITD: 36.86 ng/mL (ref 30.00–100.00)

## 2022-03-05 LAB — HEMOGLOBIN A1C: Hgb A1c MFr Bld: 6.1 % (ref 4.6–6.5)

## 2022-03-05 MED ORDER — CYANOCOBALAMIN 1000 MCG/ML IJ SOLN
1000.0000 ug | Freq: Once | INTRAMUSCULAR | Status: AC
  Administered 2022-03-05: 1000 ug via INTRAMUSCULAR

## 2022-03-05 NOTE — Assessment & Plan Note (Signed)
With myoclonic jerks reported on the night she had vertigo.  She continues to defer sleep study

## 2022-03-05 NOTE — Assessment & Plan Note (Signed)
Isolated episodes reported by patient on the night she developed vertigo.  Chronic infarct noted on MRI.Marland Kitchen  She has declined Sleep study but snores.

## 2022-03-05 NOTE — Assessment & Plan Note (Addendum)
Level was  <300 during neurologic workup for dizziness and chronic r cerebellar infarct .  She has been taking gummie bears.  Checking IF antibody .  She was given an IM injection today

## 2022-03-05 NOTE — Progress Notes (Signed)
Subjective:  Patient ID: [name], female    DOB: 1941/05/04  Age: 81 y.o. MRN: [id number]  CC: The primary encounter diagnosis was Essential hypertension. Diagnoses of Hyperlipidemia associated with type 2 diabetes mellitus ([place]), DM type 2, controlled, with complication ([place]), Cerebral vascular disease, History of CVA (cerebrovascular accident), Vitamin D deficiency, B12 deficiency, Nocturnal myoclonus, and Snoring were also pertinent to this visit.   HPI [name] presents for  Chief Complaint  Patient presents with   Follow-up    6 month follow up    [name] underwent workup in May for CVA when a chronic right cerebellar infarct was found  on MRI/MRA brain ordered by [facility] to investigate complaints of nocturnal vertigo that occurred after her knee replacement.  Symptoms aggravated by urge for nocturnal void ;  accompanied by recurrent headaches,  variable in location. On the nigh  Neurology referral followed;  orthostasis was opined to be the cause of her dizziness.  PT and sleep study as well as carotids and ECHO were all ordered as a result  of the MRI findings  Carotid ultrasound was normal Patient refused the sleep study  ECHO has not been done either.  TG did an EKG   Has not had an occurrence of vertigo since the workup.  She has resumed 81 mg asa after a 6 month suspension .   Seeing [facility] for knee follow yp (Feb 2023)  . Sciatica on the right side , not constant   , will alternate sides.   Using tylenol and ibuprofen  800 mg  prescribed by [facility].  Using biofreeze   Type 2 DM:  BS run low occasionally (not more than 1-2 times per month), most often the lows occur  between breakfast and lunch,  low of 64 . usually 110-130 in the am    taking glipizide   5  mg in the am ,  Metform 500 lunch  5 mg glipizide at dinner and pioglitazone  Outpatient Medications Prior to Visit  Medication Sig Dispense Refill   ALPRAZolam (XANAX) 0.25 MG tablet Take 0.5 tablets (0.125 mg  total) by mouth at bedtime as needed for anxiety. 15 tablet 5   aspirin EC 81 MG tablet Take 1 tablet (81 mg total) by mouth daily. Swallow whole.     atorvastatin (LIPITOR) 20 MG tablet TAKE 1 TABLET BY MOUTH EVERY DAY 90 tablet 3   Blood Glucose Monitoring Suppl (ONE TOUCH ULTRA 2) w/Device KIT Use to check blood sugars once daily. 1 kit 0   cholecalciferol (VITAMIN D) 25 MCG (1000 UNIT) tablet Take 2 tablets (2,000 Units total) by mouth daily. 90 tablet 1   glipiZIDE (GLUCOTROL) 5 MG tablet 1/2 tablet at breakfast,  and 2 tablets at dinnertime 270 tablet 1   glucose blood (ONETOUCH ULTRA) test strip USE AS DIRECTED 100 strip 10   levothyroxine (SYNTHROID) 88 MCG tablet TAKE 1 TABLET BY MOUTH EVERY DAY ON EMPTY STOMACH WITH WATER AT LEAST 30-60 MINS BEFORE BREAKFAST 90 tablet 3   losartan (COZAAR) 50 MG tablet TAKE 1 TABLET BY MOUTH EVERY DAY 90 tablet 3   metFORMIN (GLUCOPHAGE-XR) 500 MG 24 hr tablet TAKE 1 TABLET BY MOUTH EVERY DAY WITH BREAKFAST 90 tablet 3   pioglitazone (ACTOS) 15 MG tablet TAKE 1 TABLET (15 MG TOTAL) BY MOUTH DAILY WITH SUPPER 90 tablet 1   meclizine (ANTIVERT) 25 MG tablet Take 0.5-1 tablets (12.5-25 mg total) by mouth 2 (two) times daily as  needed for dizziness. (Patient not taking: Reported on 03/05/2022) 60 tablet 2   No facility-administered medications prior to visit.    Review of Systems;  Patient denies headache, fevers, malaise, unintentional weight loss, skin rash, eye pain, sinus congestion and sinus pain, sore throat, dysphagia,  hemoptysis , cough, dyspnea, wheezing, chest pain, palpitations, orthopnea, edema, abdominal pain, nausea, melena, diarrhea, constipation, flank pain, dysuria, hematuria, urinary  Frequency, nocturia, numbness, tingling, seizures,  Focal weakness, Loss of consciousness,  Tremor, insomnia, depression, anxiety, and suicidal ideation.      Objective:  BP (!) 144/68 (BP Location: Left Arm, Patient Position: Sitting, Cuff Size: Normal)    Pulse 70   Temp 98.1 F (36.7 C) (Oral)   Ht _0  (1.6 m)   Wt 159 lb 3.2 oz (72.2 kg)   SpO2 96%   BMI 28.20 kg/m   BP Readings from Last 3 Encounters:  03/05/22 (!) 144/68  01/26/22 (!) 176/76  01/15/22 (!) 177/69    Wt Readings from Last 3 Encounters:  03/05/22 159 lb 3.2 oz (72.2 kg)  01/26/22 162 lb (73.5 kg)  01/15/22 159 lb 8 oz (72.3 kg)    General appearance: alert, cooperative and appears stated age Ears: normal TM's and external ear canals both ears Throat: lips, mucosa, and tongue normal; teeth and gums normal Neck: no adenopathy, no carotid bruit, supple, symmetrical, trachea midline and thyroid not enlarged, symmetric, no tenderness/mass/nodules Back: symmetric, no curvature. ROM normal. No CVA tenderness. Lungs: clear to auscultation bilaterally Heart: regular rate and rhythm, S1, S2 normal, no murmur, click, rub or gallop Abdomen: soft, non-tender; bowel sounds normal; no masses,  no organomegaly Pulses: 2+ and symmetric Skin: Skin color, texture, turgor normal. No rashes or lesions Lymph nodes: Cervical, supraclavicular, and axillary nodes normal.  Lab Results  Component Value Date   HGBA1C 5.9 (H) 10/05/2021   HGBA1C 6.1 08/22/2021   HGBA1C 5.9 02/22/2021    Lab Results  Component Value Date   CREATININE 0.67 12/20/2021   CREATININE 0.79 10/05/2021   CREATININE 0.75 08/22/2021    Lab Results  Component Value Date   WBC 5.6 12/20/2021   HGB 12.6 12/20/2021   HCT 37.6 12/20/2021   PLT 149.0 (L) 12/20/2021   GLUCOSE 118 (H) 12/20/2021   CHOL 140 08/22/2021   TRIG 127.0 08/22/2021   HDL 59.00 08/22/2021   LDLDIRECT 87.0 02/19/2018   LDLCALC 55 08/22/2021   ALT 9 12/20/2021   AST 12 12/20/2021   NA 139 12/20/2021   K 4.8 12/20/2021   CL 104 12/20/2021   CREATININE 0.67 12/20/2021   BUN 27 (H) 12/20/2021   CO2 26 12/20/2021   TSH 2.550 01/15/2022   HGBA1C 5.9 (H) 10/05/2021   MICROALBUR 0.9 08/22/2021    Abdomen 1 view  (KUB)  Result Date: 01/27/2022 CLINICAL DATA:  Nephrolithiasis, follow-up EXAM: ABDOMEN - 1 VIEW COMPARISON:  02/24/2021 FINDINGS: Questionable tiny calculus versus stool artifact projecting over inferior pole LEFT kidney. Tiny mid RIGHT renal calculus. Nonobstructive bowel gas pattern with scattered stool throughout colon to rectum. Osseous demineralization with degenerative disc and facet disease changes lumbar spine. No additional pathologic calcifications. IMPRESSION: Questionable tiny calculus versus stool artifact projecting over inferior pole LEFT kidney. Tiny mid RIGHT renal calculus. Electronically Signed   By: Lavonia Dana M.D.   On: 01/27/2022 13:55    Assessment & Plan:   Problem List Items Addressed This Visit     Hyperlipidemia associated with type 2 diabetes mellitus ([place])  Relevant Orders   Lipid Profile   Direct LDL   DM type 2, controlled, with complication ([place])    With atherosclerosis and hypertension. A1c remains very low  At < 6.0  She has had episodes of hypoglycemia occurring between breakfast and lumch because she has not been cutting her 5 mg glipizide tablet in half.  encouraged her to use pill cutter.   Continue  glipizide  actos and metformin. Continue asa, statin and ARB  Lab Results  Component Value Date   HGBA1C 5.9 (H) 10/05/2021   Lab Results  Component Value Date   LABMICR See below: 01/26/2022   LABMICR See below: 02/24/2021   MICROALBUR 0.9 08/22/2021   MICROALBUR 1.0 02/22/2021           Relevant Orders   HgB A1c   Comp Met (CMET)   B12 deficiency    Level was  <300 during neurologic workup for dizziness and chronic r cerebellar infarct .  She has been taking gummie bears.  Checking IF antibody .  She was given an IM injection today       Relevant Orders   Intrinsic Factor Antibodies   Nocturnal myoclonus    Isolated episodes reported by patient on the night she developed vertigo.  Chronic infarct noted on MRI.Marland Kitchen  She has declined Sleep  study but snores.       Essential hypertension - Primary   Relevant Orders   Comp Met (CMET)   Cerebral vascular disease    MRI/MRA brain done by [facility] in May to rule out CVA as cause of dizziness       Snoring    With myoclonic jerks reported on the night she had vertigo.  She continues to defer sleep study      History of CVA (cerebrovascular accident)   Other Visit Diagnoses     Vitamin D deficiency       Relevant Orders   VITAMIN D 25 Hydroxy (Vit-D Deficiency, Fractures)       I spent a total of  35  minutes with this patient in a face to face visit on the date of this encounter reviewing the last office visit with Dr Aundra Dubin in May,  her  most recent visit with Dr Lise Auer in April,  her neurology evaluation in late May , patient'ss diet and eating habits, home blood pressure readings ,  most recent imaging studies including MRI/MRA  and carotid dopplers,  and post visit ordering of testing and therapeutics.    Follow-up: Return in about 3 months (around 06/05/2022) for follow up diabetes.   Crecencio Mc, MD

## 2022-03-05 NOTE — Assessment & Plan Note (Signed)
With atherosclerosis and hypertension. A1c remains very low  At < 6.0  She has had episodes of hypoglycemia occurring between breakfast and lumch because she has not been cutting her 5 mg glipizide tablet in half.  encouraged her to use pill cutter.   Continue  glipizide  actos and metformin. Continue asa, statin and ARB  Lab Results  Component Value Date   HGBA1C 5.9 (H) 10/05/2021   Lab Results  Component Value Date   LABMICR See below: 01/26/2022   LABMICR See below: 02/24/2021   MICROALBUR 0.9 08/22/2021   MICROALBUR 1.0 02/22/2021

## 2022-03-05 NOTE — Patient Instructions (Signed)
The "jerking" episodes that you had recently can be a side effect of Sleep apnea.  Sleep apnea has been shown to increase your risk fofhaving a stroke:  please consider having the sleep study  Continue your baby aspirin daily   Low B12 can cause balance issues and dizziness You should be taking b12 supplements daily You might require "shots" that your granddaughter can give your,  if you have an intrinsie factor antibody

## 2022-03-05 NOTE — Assessment & Plan Note (Signed)
MRI/MRA brain done by [facility] in May to rule out CVA as cause of dizziness

## 2022-03-08 LAB — INTRINSIC FACTOR ANTIBODIES: Intrinsic Factor: POSITIVE — AB

## 2022-03-14 ENCOUNTER — Ambulatory Visit (INDEPENDENT_AMBULATORY_CARE_PROVIDER_SITE_OTHER): Payer: Medicare HMO | Admitting: *Deleted

## 2022-03-14 DIAGNOSIS — E538 Deficiency of other specified B group vitamins: Secondary | ICD-10-CM

## 2022-03-14 MED ORDER — CYANOCOBALAMIN 1000 MCG/ML IJ SOLN
1000.0000 ug | Freq: Once | INTRAMUSCULAR | Status: AC
  Administered 2022-03-14: 1000 ug via INTRAMUSCULAR

## 2022-03-14 NOTE — Progress Notes (Addendum)
Patioent presented for b12 injection to R Deltoid  Patient voiced no concerns nor showed any signs of distress during injection.

## 2022-03-21 ENCOUNTER — Ambulatory Visit (INDEPENDENT_AMBULATORY_CARE_PROVIDER_SITE_OTHER): Payer: Medicare HMO | Admitting: *Deleted

## 2022-03-21 DIAGNOSIS — E538 Deficiency of other specified B group vitamins: Secondary | ICD-10-CM

## 2022-03-21 MED ORDER — CYANOCOBALAMIN 1000 MCG/ML IJ SOLN
1000.0000 ug | Freq: Once | INTRAMUSCULAR | Status: AC
  Administered 2022-03-21: 1000 ug via INTRAMUSCULAR

## 2022-03-21 NOTE — Progress Notes (Signed)
Pt received B12 injection in left arm. Tolerated it well with no complaints or concerns.

## 2022-03-28 ENCOUNTER — Ambulatory Visit (INDEPENDENT_AMBULATORY_CARE_PROVIDER_SITE_OTHER): Payer: Medicare HMO

## 2022-03-28 DIAGNOSIS — E538 Deficiency of other specified B group vitamins: Secondary | ICD-10-CM | POA: Diagnosis not present

## 2022-03-28 MED ORDER — CYANOCOBALAMIN 1000 MCG/ML IJ SOLN
1000.0000 ug | Freq: Once | INTRAMUSCULAR | Status: AC
  Administered 2022-03-28: 1000 ug via INTRAMUSCULAR

## 2022-03-28 NOTE — Progress Notes (Signed)
Patient presented for B 12 injection to right deltoid, patient voiced no concerns nor showed any signs of distress during injection. 

## 2022-04-05 DIAGNOSIS — H43813 Vitreous degeneration, bilateral: Secondary | ICD-10-CM | POA: Diagnosis not present

## 2022-04-05 LAB — HM DIABETES EYE EXAM

## 2022-04-12 ENCOUNTER — Telehealth: Payer: Self-pay | Admitting: Internal Medicine

## 2022-04-12 MED ORDER — "SYRINGE 25G X 1"" 3 ML MISC": 0 refills | Status: DC

## 2022-04-12 MED ORDER — CYANOCOBALAMIN 1000 MCG/ML IJ SOLN: 1000.0000 ug | INTRAMUSCULAR | 3 refills | Status: DC

## 2022-04-12 NOTE — Telephone Encounter (Signed)
Liquid and syringes sent to [facility]

## 2022-04-12 NOTE — Telephone Encounter (Signed)
If this is okay I will send in the rx for b12 injections to be given at home.

## 2022-04-12 NOTE — Telephone Encounter (Signed)
noted 

## 2022-04-12 NOTE — Telephone Encounter (Signed)
Pt called stating she need a script for a B12. Pt stated one of her family members will give her the injection

## 2022-04-12 NOTE — Addendum Note (Signed)
Addended by: Crecencio Mc on: 04/12/2022 12:55 PM   Modules accepted: Orders

## 2022-04-15 ENCOUNTER — Other Ambulatory Visit: Payer: Self-pay | Admitting: Internal Medicine

## 2022-04-24 DIAGNOSIS — Z96651 Presence of right artificial knee joint: Secondary | ICD-10-CM | POA: Diagnosis not present

## 2022-05-12 DIAGNOSIS — M545 Low back pain, unspecified: Secondary | ICD-10-CM | POA: Diagnosis not present

## 2022-05-23 DIAGNOSIS — M545 Low back pain, unspecified: Secondary | ICD-10-CM | POA: Diagnosis not present

## 2022-05-23 DIAGNOSIS — M25551 Pain in right hip: Secondary | ICD-10-CM | POA: Diagnosis not present

## 2022-06-03 ENCOUNTER — Telehealth: Payer: Self-pay | Admitting: *Deleted

## 2022-06-03 NOTE — Telephone Encounter (Signed)
$  301 due, PA required Due on or after 06/27/22 Sent mychart message-will initiate PA if patient agrees to amount due

## 2022-06-04 DIAGNOSIS — M5417 Radiculopathy, lumbosacral region: Secondary | ICD-10-CM | POA: Diagnosis not present

## 2022-06-04 DIAGNOSIS — M9901 Segmental and somatic dysfunction of cervical region: Secondary | ICD-10-CM | POA: Diagnosis not present

## 2022-06-04 DIAGNOSIS — M5387 Other specified dorsopathies, lumbosacral region: Secondary | ICD-10-CM | POA: Diagnosis not present

## 2022-06-04 DIAGNOSIS — M9905 Segmental and somatic dysfunction of pelvic region: Secondary | ICD-10-CM | POA: Diagnosis not present

## 2022-06-04 DIAGNOSIS — M9903 Segmental and somatic dysfunction of lumbar region: Secondary | ICD-10-CM | POA: Diagnosis not present

## 2022-06-04 DIAGNOSIS — M9902 Segmental and somatic dysfunction of thoracic region: Secondary | ICD-10-CM | POA: Diagnosis not present

## 2022-06-04 DIAGNOSIS — M9904 Segmental and somatic dysfunction of sacral region: Secondary | ICD-10-CM | POA: Diagnosis not present

## 2022-06-05 DIAGNOSIS — M9901 Segmental and somatic dysfunction of cervical region: Secondary | ICD-10-CM | POA: Diagnosis not present

## 2022-06-05 DIAGNOSIS — M9905 Segmental and somatic dysfunction of pelvic region: Secondary | ICD-10-CM | POA: Diagnosis not present

## 2022-06-05 DIAGNOSIS — M5417 Radiculopathy, lumbosacral region: Secondary | ICD-10-CM | POA: Diagnosis not present

## 2022-06-05 DIAGNOSIS — M5387 Other specified dorsopathies, lumbosacral region: Secondary | ICD-10-CM | POA: Diagnosis not present

## 2022-06-05 DIAGNOSIS — M9902 Segmental and somatic dysfunction of thoracic region: Secondary | ICD-10-CM | POA: Diagnosis not present

## 2022-06-05 DIAGNOSIS — M9904 Segmental and somatic dysfunction of sacral region: Secondary | ICD-10-CM | POA: Diagnosis not present

## 2022-06-05 DIAGNOSIS — M9903 Segmental and somatic dysfunction of lumbar region: Secondary | ICD-10-CM | POA: Diagnosis not present

## 2022-06-06 ENCOUNTER — Ambulatory Visit (INDEPENDENT_AMBULATORY_CARE_PROVIDER_SITE_OTHER): Payer: Medicare HMO | Admitting: Internal Medicine

## 2022-06-06 ENCOUNTER — Encounter: Payer: Self-pay | Admitting: Internal Medicine

## 2022-06-06 VITALS — BP 120/68 | HR 75 | Temp 97.6°F | Ht 63.0 in | Wt 151.8 lb

## 2022-06-06 DIAGNOSIS — E118 Type 2 diabetes mellitus with unspecified complications: Secondary | ICD-10-CM | POA: Diagnosis not present

## 2022-06-06 DIAGNOSIS — E785 Hyperlipidemia, unspecified: Secondary | ICD-10-CM

## 2022-06-06 DIAGNOSIS — E1169 Type 2 diabetes mellitus with other specified complication: Secondary | ICD-10-CM

## 2022-06-06 DIAGNOSIS — E538 Deficiency of other specified B group vitamins: Secondary | ICD-10-CM

## 2022-06-06 DIAGNOSIS — I10 Essential (primary) hypertension: Secondary | ICD-10-CM | POA: Diagnosis not present

## 2022-06-06 DIAGNOSIS — M5430 Sciatica, unspecified side: Secondary | ICD-10-CM | POA: Diagnosis not present

## 2022-06-06 LAB — COMPREHENSIVE METABOLIC PANEL WITH GFR
ALT: 10 U/L (ref 0–35)
AST: 12 U/L (ref 0–37)
Albumin: 4.6 g/dL (ref 3.5–5.2)
Alkaline Phosphatase: 74 U/L (ref 39–117)
BUN: 28 mg/dL — ABNORMAL HIGH (ref 6–23)
CO2: 28 meq/L (ref 19–32)
Calcium: 10.5 mg/dL (ref 8.4–10.5)
Chloride: 103 meq/L (ref 96–112)
Creatinine, Ser: 0.84 mg/dL (ref 0.40–1.20)
GFR: 65.51 mL/min
Glucose, Bld: 101 mg/dL — ABNORMAL HIGH (ref 70–99)
Potassium: 4.9 meq/L (ref 3.5–5.1)
Sodium: 138 meq/L (ref 135–145)
Total Bilirubin: 0.7 mg/dL (ref 0.2–1.2)
Total Protein: 7 g/dL (ref 6.0–8.3)

## 2022-06-06 LAB — MICROALBUMIN / CREATININE URINE RATIO
Creatinine,U: 247.7 mg/dL
Microalb Creat Ratio: 2.2 mg/g (ref 0.0–30.0)
Microalb, Ur: 5.6 mg/dL — ABNORMAL HIGH (ref 0.0–1.9)

## 2022-06-06 LAB — HEMOGLOBIN A1C: Hgb A1c MFr Bld: 6.1 % (ref 4.6–6.5)

## 2022-06-06 MED ORDER — TRAMADOL HCL 50 MG PO TABS: 50.0000 mg | ORAL_TABLET | Freq: Four times a day (QID) | ORAL | 0 refills | Status: AC | PRN

## 2022-06-06 MED ORDER — TIZANIDINE HCL 4 MG PO TABS: 4.0000 mg | ORAL_TABLET | Freq: Four times a day (QID) | ORAL | 0 refills | Status: DC | PRN

## 2022-06-06 MED ORDER — CELECOXIB 200 MG PO CAPS: 200.0000 mg | ORAL_CAPSULE | Freq: Two times a day (BID) | ORAL | 1 refills | Status: DC

## 2022-06-06 NOTE — Patient Instructions (Addendum)
You can take 1000  mg tylenol every 12 hours (maximum dose is 1300 mg twice daily )   You can add Tramadol every 6 hours if needed   ( refilling today)     Tizanidine is a medication for muscle spasm.  It will make you sleepy.  You can take it with your other medications  Stop meloxicam and use celebrex instead for inflammation:  1 tablet every 12 hours

## 2022-06-06 NOTE — Progress Notes (Signed)
 "  Subjective:  Patient ID: [name], female    DOB: 1940/12/17  Age: 81 y.o. MRN: [id number]  CC: The primary encounter diagnosis was Hyperlipidemia associated with type 2 diabetes mellitus (HCC). Diagnoses of DM type 2, controlled, with complication (HCC), Sciatica, unspecified laterality, Essential hypertension, and B12 deficiency were also pertinent to this visit.   HPI [name] presents for follow up on multiple issues.  Chief Complaint  Patient presents with   Follow-up    3 month follow up   1) back pain/strain: for the past month,  occurred after moving some rocks in the yard  before Labor day  back started spasming a few days later. Pain was severe,  finally went to [facility] on Sept 9 . 2 visits thus far,  being treated  with steroid taper ,  which did not help much.  The following week she was given  tramadol   to use,  which she is tolerating .  Also Using heat and ice.  Unable to sleep for more than 1-2 hours before pain wakes  her up. She has been taking meloxicam  15 mg  daily (from Dr Hooten)'s remote knee surgery)   since stopping the prednisone .  She Has not had PT yet,  but  PT anticipated at follow up on Thursday . The pain radiates from right SI joint to  the right groin AND below the right knee .  Occasionally has stabbing pains in the right hip   Started seeing a chiropractor ,  2 sessions this week thus far  .  Going tomorrow as well.  Feels it is helping & she was given stretching exercises to do   Sugars were elevated during prednisone  but have improved.  No appetite.  Losing weight dpown 7 lbs     2) Type 2 DM:  taking actos , glipizide , metformin , losartan  and lipitor . She  feels generally well,  But is not  exercising due to her back pain.  Checking  blood sugars less than once daily at variable times, usually only if she feels she may be having a hypoglycemic event. .  BS have been under 130 fasting and < 150 post prandially.  Denies any recent  hypoglyemic events.  Taking   medications as directed. Following a carbohydrate modified diet 6 days per week. Denies numbness, burning and tingling of extremities. Appetite is good.    3) pernicious anemia:  started weekly b12 injections in early July, #4 in office ending July 26   had last one August   by her granddaughter [name]     4) Hypertension: patient checks blood pressure twice weekly at home.  Readings have been for the most part < 140/80 at rest . Patient is following a reduced salt diet most days and is taking medications as prescribed   Outpatient Medications Prior to Visit  Medication Sig Dispense Refill   ALPRAZolam  (XANAX ) 0.25 MG tablet Take 0.5 tablets (0.125 mg total) by mouth at bedtime as needed for anxiety. 15 tablet 5   aspirin  EC 81 MG tablet Take 1 tablet (81 mg total) by mouth daily. Swallow whole.     atorvastatin  (LIPITOR) 20 MG tablet TAKE 1 TABLET BY MOUTH EVERY DAY 90 tablet 3   Blood Glucose Monitoring Suppl (ONE TOUCH ULTRA 2) w/Device KIT Use to check blood sugars once daily. 1 kit 0   cholecalciferol  (VITAMIN D ) 25 MCG (1000 UNIT) tablet Take 2 tablets (2,000 Units total) by mouth daily. 90  tablet 1   cyanocobalamin  (VITAMIN B12) 1000 MCG/ML injection Inject 1 mL (1,000 mcg total) into the muscle every 30 (thirty) days. 3 mL 3   glipiZIDE  (GLUCOTROL ) 5 MG tablet 1/2 tablet at breakfast,  and 2 tablets at dinnertime 270 tablet 1   levothyroxine  (SYNTHROID ) 88 MCG tablet TAKE 1 TABLET BY MOUTH EVERY DAY ON EMPTY STOMACH WITH WATER AT LEAST 30-60 MINS BEFORE BREAKFAST 90 tablet 3   losartan  (COZAAR ) 50 MG tablet TAKE 1 TABLET BY MOUTH EVERY DAY 90 tablet 3   metFORMIN  (GLUCOPHAGE -XR) 500 MG 24 hr tablet TAKE 1 TABLET BY MOUTH EVERY DAY WITH BREAKFAST 90 tablet 3   ONETOUCH ULTRA test strip USE AS DIRECTED 100 strip 10   pioglitazone  (ACTOS ) 15 MG tablet TAKE 1 TABLET (15 MG TOTAL) BY MOUTH DAILY WITH SUPPER 90 tablet 1   Syringe/Needle, Disp, (SYRINGE 3CC/25GX1)  25G X 1 3 ML MISC Use for b12 injections 50 each 0   meclizine  (ANTIVERT ) 25 MG tablet Take 0.5-1 tablets (12.5-25 mg total) by mouth 2 (two) times daily as needed for dizziness. (Patient not taking: Reported on 03/05/2022) 60 tablet 2   No facility-administered medications prior to visit.    Review of Systems;  Patient denies headache, fevers, malaise, unintentional weight loss, skin rash, eye pain, sinus congestion and sinus pain, sore throat, dysphagia,  hemoptysis , cough, dyspnea, wheezing, chest pain, palpitations, orthopnea, edema, abdominal pain, nausea, melena, diarrhea, constipation, flank pain, dysuria, hematuria, urinary  Frequency, nocturia, numbness, tingling, seizures,  Focal weakness, Loss of consciousness,  Tremor, insomnia, depression, anxiety, and suicidal ideation.      Objective:  BP 120/68 (BP Location: Left Arm, Patient Position: Sitting, Cuff Size: Normal)   Pulse 75   Temp 97.6 F (36.4 C) (Oral)   Ht 5' 3 (1.6 m)   Wt 151 lb 12.8 oz (68.9 kg)   SpO2 98%   BMI 26.89 kg/m   BP Readings from Last 3 Encounters:  06/06/22 120/68  03/05/22 (!) 144/68  01/26/22 (!) 176/76    Wt Readings from Last 3 Encounters:  06/06/22 151 lb 12.8 oz (68.9 kg)  03/05/22 159 lb 3.2 oz (72.2 kg)  01/26/22 162 lb (73.5 kg)    General appearance: alert, cooperative and appears stated age Ears: normal TM's and external ear canals both ears Throat: lips, mucosa, and tongue normal; teeth and gums normal Neck: no adenopathy, no carotid bruit, supple, symmetrical, trachea midline and thyroid  not enlarged, symmetric, no tenderness/mass/nodules Back: symmetric, no curvature. ROM normal. No CVA tenderness. Lungs: clear to auscultation bilaterally Heart: regular rate and rhythm, S1, S2 normal, no murmur, click, rub or gallop Abdomen: soft, non-tender; bowel sounds normal; no masses,  no organomegaly Pulses: 2+ and symmetric Skin: Skin color, texture, turgor normal. No rashes or  lesions Lymph nodes: Cervical, supraclavicular, and axillary nodes normal. Neuro:  awake and interactive with normal mood and affect. Higher cortical functions are normal. Speech is clear without word-finding difficulty or dysarthria. Extraocular movements are intact. Visual fields of both eyes are grossly intact. Sensation to light touch is grossly intact bilaterally of upper and lower extremities. Motor examination shows 4+/5 symmetric hand grip and upper extremity and 5/5 lower extremity strength. There is no pronation or drift. Gait is non-ataxic   Lab Results  Component Value Date   HGBA1C 6.1 06/06/2022   HGBA1C 6.1 03/05/2022   HGBA1C 5.9 (H) 10/05/2021    Lab Results  Component Value Date   CREATININE 0.84 06/06/2022  CREATININE 0.78 03/05/2022   CREATININE 0.67 12/20/2021    Lab Results  Component Value Date   WBC 5.6 12/20/2021   HGB 12.6 12/20/2021   HCT 37.6 12/20/2021   PLT 149.0 (L) 12/20/2021   GLUCOSE 101 (H) 06/06/2022   CHOL 133 03/05/2022   TRIG 114.0 03/05/2022   HDL 53.20 03/05/2022   LDLDIRECT 65.0 03/05/2022   LDLCALC 57 03/05/2022   ALT 10 06/06/2022   AST 12 06/06/2022   NA 138 06/06/2022   K 4.9 06/06/2022   CL 103 06/06/2022   CREATININE 0.84 06/06/2022   BUN 28 (H) 06/06/2022   CO2 28 06/06/2022   TSH 2.550 01/15/2022   HGBA1C 6.1 06/06/2022   MICROALBUR 5.6 (H) 06/06/2022    Abdomen 1 view (KUB)  Result Date: 01/27/2022 CLINICAL DATA:  Nephrolithiasis, follow-up EXAM: ABDOMEN - 1 VIEW COMPARISON:  02/24/2021 FINDINGS: Questionable tiny calculus versus stool artifact projecting over inferior pole LEFT kidney. Tiny mid RIGHT renal calculus. Nonobstructive bowel gas pattern with scattered stool throughout colon to rectum. Osseous demineralization with degenerative disc and facet disease changes lumbar spine. No additional pathologic calcifications. IMPRESSION: Questionable tiny calculus versus stool artifact projecting over inferior pole LEFT  kidney. Tiny mid RIGHT renal calculus. Electronically Signed   By: Oneil Kiss M.D.   On: 01/27/2022 13:55    Assessment & Plan:   Problem List Items Addressed This Visit     Hyperlipidemia associated with type 2 diabetes mellitus (HCC) - Primary   Relevant Orders   Hemoglobin A1c (Completed)   Comprehensive metabolic panel (Completed)   Microalbumin / creatinine urine ratio (Completed)   Essential hypertension    Well controlled on current regimen of losartan  . Renal function stable, no changes today.      DM type 2, controlled, with complication (HCC)    With atherosclerosis and hypertension. A1c remains excellent on glipizide , actos  and metformin  . No changes today. She has minimal microalbuminuria , tolerating losartan ,  And tolerating atorvastatin   No changes today   Lab Results  Component Value Date   HGBA1C 6.1 06/06/2022   Lab Results  Component Value Date   LABMICR See below: 01/26/2022   LABMICR See below: 02/24/2021   MICROALBUR 5.6 (H) 06/06/2022   MICROALBUR 0.9 08/22/2021           B12 deficiency    If antibody positive.  Begin weekly injections  For 4 weeks,  Then monthly thereafter   Lab Results  Component Value Date   VITAMINB12 237 01/15/2022         Sciatica    Secondary to lifting rocks.  [facility] managing   With PT planned prior to MRI.  Changing meloxicam  to celebrex  200 mg bid.  Adding tramadol        Relevant Medications   tiZANidine  (ZANAFLEX ) 4 MG tablet    I spent a total of 26  minutes with this patient in a face to face visit on the date of this encounter reviewing the last office visit with me in  arch ,   most recent visit with orthopedics ,   patient's diet and exercise habits, home blood pressure /blood sugar readings, recent ER visit including labs and imaging studies ,   and post visit ordering of testing and therapeutics.    Follow-up: Return in about 6 months (around 12/06/2022).   Megan LITTIE Kettering, MD "

## 2022-06-07 DIAGNOSIS — M9901 Segmental and somatic dysfunction of cervical region: Secondary | ICD-10-CM | POA: Diagnosis not present

## 2022-06-07 DIAGNOSIS — M9902 Segmental and somatic dysfunction of thoracic region: Secondary | ICD-10-CM | POA: Diagnosis not present

## 2022-06-07 DIAGNOSIS — M9905 Segmental and somatic dysfunction of pelvic region: Secondary | ICD-10-CM | POA: Diagnosis not present

## 2022-06-07 DIAGNOSIS — M9903 Segmental and somatic dysfunction of lumbar region: Secondary | ICD-10-CM | POA: Diagnosis not present

## 2022-06-07 DIAGNOSIS — M5417 Radiculopathy, lumbosacral region: Secondary | ICD-10-CM | POA: Diagnosis not present

## 2022-06-07 DIAGNOSIS — M5387 Other specified dorsopathies, lumbosacral region: Secondary | ICD-10-CM | POA: Diagnosis not present

## 2022-06-07 DIAGNOSIS — M9904 Segmental and somatic dysfunction of sacral region: Secondary | ICD-10-CM | POA: Diagnosis not present

## 2022-06-07 DIAGNOSIS — M5430 Sciatica, unspecified side: Secondary | ICD-10-CM | POA: Insufficient documentation

## 2022-06-07 NOTE — Assessment & Plan Note (Signed)
Well controlled on current regimen of losartan . Renal function stable, no changes today.

## 2022-06-07 NOTE — Telephone Encounter (Signed)
Left voicemail to return call. 

## 2022-06-07 NOTE — Assessment & Plan Note (Addendum)
With atherosclerosis and hypertension. A1c remains excellent on glipizide, actos and metformin . No changes today. She has minimal microalbuminuria , tolerating losartan,  And tolerating atorvastatin  No changes today   Lab Results  Component Value Date   HGBA1C 6.1 06/06/2022   Lab Results  Component Value Date   LABMICR See below: 01/26/2022   LABMICR See below: 02/24/2021   MICROALBUR 5.6 (H) 06/06/2022   MICROALBUR 0.9 08/22/2021

## 2022-06-07 NOTE — Telephone Encounter (Signed)
Patient returned Cannon Kettle, CMA's call.  I read Latoya's message to patient and scheduled patient for prolia injection.

## 2022-06-07 NOTE — Telephone Encounter (Signed)
Noted, thanks!

## 2022-06-07 NOTE — Assessment & Plan Note (Addendum)
Secondary to lifting rocks.  [facility] managing   With PT planned prior to MRI.  Changing meloxicam to celebrex 200 mg bid.  Adding tramadol

## 2022-06-07 NOTE — Assessment & Plan Note (Signed)
If antibody positive.  Begin weekly injections  For 4 weeks,  Then monthly thereafter   Lab Results  Component Value Date   VITAMINB12 237 01/15/2022

## 2022-06-11 ENCOUNTER — Telehealth: Payer: Self-pay | Admitting: *Deleted

## 2022-06-11 DIAGNOSIS — M5387 Other specified dorsopathies, lumbosacral region: Secondary | ICD-10-CM | POA: Diagnosis not present

## 2022-06-11 DIAGNOSIS — M9904 Segmental and somatic dysfunction of sacral region: Secondary | ICD-10-CM | POA: Diagnosis not present

## 2022-06-11 DIAGNOSIS — M9905 Segmental and somatic dysfunction of pelvic region: Secondary | ICD-10-CM | POA: Diagnosis not present

## 2022-06-11 DIAGNOSIS — M9901 Segmental and somatic dysfunction of cervical region: Secondary | ICD-10-CM | POA: Diagnosis not present

## 2022-06-11 DIAGNOSIS — M9903 Segmental and somatic dysfunction of lumbar region: Secondary | ICD-10-CM | POA: Diagnosis not present

## 2022-06-11 DIAGNOSIS — M5417 Radiculopathy, lumbosacral region: Secondary | ICD-10-CM | POA: Diagnosis not present

## 2022-06-11 DIAGNOSIS — M9902 Segmental and somatic dysfunction of thoracic region: Secondary | ICD-10-CM | POA: Diagnosis not present

## 2022-06-11 NOTE — Telephone Encounter (Signed)
Patient returned Cannon Kettle, CMA's call.  I read Latoya's message to patient.  Patient would like to speak with [facility] for clarification.

## 2022-06-11 NOTE — Telephone Encounter (Signed)
Prior Authorization request faxed to [employer].  I also left a voicemail to notify the patient that she should be on 1000 mg of Calcium daily while taking Prolia.

## 2022-06-11 NOTE — Telephone Encounter (Signed)
I spoke with patient to clarify instructions. Pt has not been on Calicum but has been informed to start on OTC Calcium this week.

## 2022-06-12 DIAGNOSIS — M5417 Radiculopathy, lumbosacral region: Secondary | ICD-10-CM | POA: Diagnosis not present

## 2022-06-12 DIAGNOSIS — M9904 Segmental and somatic dysfunction of sacral region: Secondary | ICD-10-CM | POA: Diagnosis not present

## 2022-06-12 DIAGNOSIS — M5387 Other specified dorsopathies, lumbosacral region: Secondary | ICD-10-CM | POA: Diagnosis not present

## 2022-06-12 DIAGNOSIS — M9902 Segmental and somatic dysfunction of thoracic region: Secondary | ICD-10-CM | POA: Diagnosis not present

## 2022-06-12 DIAGNOSIS — M9903 Segmental and somatic dysfunction of lumbar region: Secondary | ICD-10-CM | POA: Diagnosis not present

## 2022-06-12 DIAGNOSIS — M9905 Segmental and somatic dysfunction of pelvic region: Secondary | ICD-10-CM | POA: Diagnosis not present

## 2022-06-12 NOTE — Telephone Encounter (Signed)
Prolia Auth already on file (scanned into epic also)  Eff: 12/04/21-12/05/22

## 2022-06-12 NOTE — Telephone Encounter (Signed)
Prolia Prior auth on file (in chart)  Effective: 12/04/21-12/05/22

## 2022-06-14 DIAGNOSIS — M5416 Radiculopathy, lumbar region: Secondary | ICD-10-CM | POA: Diagnosis not present

## 2022-06-22 ENCOUNTER — Other Ambulatory Visit: Payer: Self-pay | Admitting: Internal Medicine

## 2022-06-22 DIAGNOSIS — Z1231 Encounter for screening mammogram for malignant neoplasm of breast: Secondary | ICD-10-CM

## 2022-06-27 ENCOUNTER — Ambulatory Visit: Payer: Medicare HMO

## 2022-06-27 DIAGNOSIS — M810 Age-related osteoporosis without current pathological fracture: Secondary | ICD-10-CM

## 2022-06-27 MED ORDER — DENOSUMAB 60 MG/ML ~~LOC~~ SOSY
60.0000 mg | PREFILLED_SYRINGE | Freq: Once | SUBCUTANEOUS | Status: AC
  Administered 2022-06-27: 60 mg via SUBCUTANEOUS

## 2022-06-27 NOTE — Progress Notes (Signed)
[name] presents today for injection per MD orders. Prolia administered SQ in left Upper Arm. Administration without incident. Patient tolerated well.  Jimma Ortman,cma

## 2022-06-28 DIAGNOSIS — M5136 Other intervertebral disc degeneration, lumbar region: Secondary | ICD-10-CM | POA: Diagnosis not present

## 2022-06-28 DIAGNOSIS — M47816 Spondylosis without myelopathy or radiculopathy, lumbar region: Secondary | ICD-10-CM | POA: Diagnosis not present

## 2022-06-28 DIAGNOSIS — M11251 Other chondrocalcinosis, right hip: Secondary | ICD-10-CM | POA: Diagnosis not present

## 2022-06-28 DIAGNOSIS — K5900 Constipation, unspecified: Secondary | ICD-10-CM | POA: Diagnosis not present

## 2022-06-28 DIAGNOSIS — R1031 Right lower quadrant pain: Secondary | ICD-10-CM | POA: Diagnosis not present

## 2022-06-28 DIAGNOSIS — I7090 Unspecified atherosclerosis: Secondary | ICD-10-CM | POA: Diagnosis not present

## 2022-06-28 DIAGNOSIS — N200 Calculus of kidney: Secondary | ICD-10-CM | POA: Diagnosis not present

## 2022-06-28 DIAGNOSIS — M4316 Spondylolisthesis, lumbar region: Secondary | ICD-10-CM | POA: Diagnosis not present

## 2022-06-28 DIAGNOSIS — M11252 Other chondrocalcinosis, left hip: Secondary | ICD-10-CM | POA: Diagnosis not present

## 2022-06-28 DIAGNOSIS — M160 Bilateral primary osteoarthritis of hip: Secondary | ICD-10-CM | POA: Diagnosis not present

## 2022-06-28 DIAGNOSIS — M5431 Sciatica, right side: Secondary | ICD-10-CM | POA: Diagnosis not present

## 2022-07-05 ENCOUNTER — Other Ambulatory Visit: Payer: Self-pay | Admitting: Internal Medicine

## 2022-07-11 ENCOUNTER — Encounter: Payer: Self-pay | Admitting: Internal Medicine

## 2022-07-25 ENCOUNTER — Ambulatory Visit: Payer: Medicare HMO | Admitting: Orthopaedic Surgery

## 2022-07-25 ENCOUNTER — Ambulatory Visit (INDEPENDENT_AMBULATORY_CARE_PROVIDER_SITE_OTHER): Payer: Medicare HMO

## 2022-07-25 ENCOUNTER — Encounter: Payer: Self-pay | Admitting: Orthopaedic Surgery

## 2022-07-25 VITALS — BP 130/79 | HR 97 | Ht 63.0 in | Wt 147.0 lb

## 2022-07-25 DIAGNOSIS — M545 Low back pain, unspecified: Secondary | ICD-10-CM

## 2022-07-25 DIAGNOSIS — M7061 Trochanteric bursitis, right hip: Secondary | ICD-10-CM

## 2022-07-28 ENCOUNTER — Encounter: Payer: Self-pay | Admitting: Internal Medicine

## 2022-07-30 DIAGNOSIS — M7061 Trochanteric bursitis, right hip: Secondary | ICD-10-CM | POA: Insufficient documentation

## 2022-07-30 NOTE — Telephone Encounter (Signed)
Pt's granddaughter is concerned about the way pt should be taking her diabetes medications. Granddaughter stated that pt is taking a half tablet of Glipizide with breakfast, 2 tablets with lunch and takes the Pioglitazone 1 tablet at supper. She is concerned that the pt should be taking the two tablets of Glipizide at supper instead of at lunch time.

## 2022-07-30 NOTE — Progress Notes (Signed)
Office Visit Note   Patient: [name]           Date of Birth: 10-Oct-1940           MRN: [id number] Visit Date: 07/25/2022              Requested by: Crecencio Mc, MD [id number] [place],  [place] [address] PCP: Crecencio Mc, MD   Assessment & Plan: Visit Diagnoses:  1. Acute right-sided low back pain, unspecified whether sciatica present   2. Trochanteric bursitis, right hip     Plan: Right trochanteric bursa injection performed 3 cc.  She tolerated the injection well and felt improvement postinjection.  We reviewed plain radiographs and discussed anterolisthesis.  If she develops weakness or problems with toes or heel walking she can return.  She will watch her sugars carefully postinjection we discussed potential for some hyperglycemia postinjection.  Follow-Up Instructions: No follow-ups on file.   Orders:  Orders Placed This Encounter  Procedures   XR Lumbar Spine 2-3 Views   No orders of the defined types were placed in this encounter.     Procedures: No procedures performed   Clinical Data: No additional findings.   Subjective: Chief Complaint  Patient presents with   Lower Back - Pain    HPI 81 year old female with previous right total knee arthroplasty 10/18/2021 and positive history for diabetes presents with pain since Labor Day in her back radiating into her left side and lateral hip.  She is taken some tramadol and tizanidine without relief.  She has increased pain with walking sometimes it goes laterally down to her knee she describes it sometimes as spasms.  Tends to get better if she walks some.  She has used ice and heating pad and has been ambulating with a cane and using a walker at home.  No bowel or bladder associated symptoms no fever or chills.  CT scan seen 2021 showed some degenerative changes in the lumbar spine.  Review of Systems previous CVA.  History of GERD type 2 diabetes.  All the systems noncontributory to  HPI.   Objective: Vital Signs: BP 130/79   Pulse 97   Ht '5\' 3"'$  (1.6 m)   Wt 147 lb (66.7 kg)   BMI 26.04 kg/m   Physical Exam Constitutional:      Appearance: She is well-developed.  HENT:     Head: Normocephalic.     Right Ear: External ear normal.     Left Ear: External ear normal. There is no impacted cerumen.  Eyes:     Pupils: Pupils are equal, round, and reactive to light.  Neck:     Thyroid: No thyromegaly.     Trachea: No tracheal deviation.  Cardiovascular:     Rate and Rhythm: Normal rate.  Pulmonary:     Effort: Pulmonary effort is normal.  Abdominal:     Palpations: Abdomen is soft.  Musculoskeletal:     Cervical back: No rigidity.  Skin:    General: Skin is warm and dry.  Neurological:     Mental Status: She is alert and oriented to person, place, and time.  Psychiatric:        Behavior: Behavior normal.     Ortho Exam well-healed right total knee arthroplasty midline incision full extension good flexion good quad strength.  She has exquisite tenderness over the right greater trochanter.  Negative logroll the hips.  Specialty Comments:  No specialty comments available.  Imaging:  No results found.   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   History of nephrolithiasis 11/03/2019   Hallux abductovalgus with bunions, right 11/23/2017   Tubular adenoma of colon 10/11/2017   Eczema of hand  05/11/2013   Overweight (BMI 25.0-29.9) 03/26/2013   Screening for cervical cancer 11/19/2011   Routine general medical examination at a health care facility 05/02/2011   DM type 2, controlled, with complication ([place]) 26/20/3559   Hypothyroidism 02/06/2007   Hyperlipidemia associated with type 2 diabetes mellitus ([place]) 02/06/2007   Essential hypertension 02/06/2007   GERD 02/06/2007   Osteoporosis, 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   Past Medical History:  Diagnosis Date   Anxiety    Diabetes mellitus    GERD (gastroesophageal reflux disease)    History of kidney stones    Hyperlipidemia    Hypertension    Hypothyroidism    Osteoporosis    Positive colorectal cancer screening using Cologuard test 2019   Thyroid disease     Family History  Problem Relation Age of Onset   Hypertension Mother    Stroke Mother 15       hemorrhagic   Aneurysm Mother    Heart disease Father    Cancer Father        Lung CA,  died of AMI while in [place] getting Laetril   Aneurysm Sister    Depression Sister    Diabetes Brother    Hypertension Brother    Breast cancer Maternal Grandmother 16   Heart disease Maternal Grandfather    Cancer Maternal Grandfather    Hypertension Son     Past Surgical History:  Procedure Laterality Date   BREAST CYST ASPIRATION Bilateral 25+ yrs ago   CATARACT EXTRACTION W/PHACO Right 03/02/2015   Procedure: CATARACT EXTRACTION PHACO AND INTRAOCULAR LENS PLACEMENT ([place]) TORIC LENS;  Surgeon: Leandrew Koyanagi, MD;  Location: [place];  Service: Ophthalmology;  Laterality: Right;  TORIC   CATARACT EXTRACTION W/PHACO Left 03/30/2015   Procedure: CATARACT EXTRACTION PHACO AND INTRAOCULAR LENS PLACEMENT ([facility]);  Surgeon: Leandrew Koyanagi, MD;  Location: [place];  Service: Ophthalmology;  Laterality: Left;  TORIC  DIABETIC - oral meds   CERVICAL POLYPECTOMY     COLONOSCOPY  01/2007   Dr Olevia Perches   COLONOSCOPY WITH PROPOFOL N/A 10/23/2017    Procedure: COLONOSCOPY WITH PROPOFOL;  Surgeon: Robert Bellow, MD;  Location: [facility]COPY;  Service: Endoscopy;  Laterality: N/A;   CYSTOSCOPY W/ RETROGRADES Bilateral 12/07/2019   Procedure: CYSTOSCOPY WITH RETROGRADE PYELOGRAM;  Surgeon: Hollice Espy, MD;  Location: [facility];  Service: Urology;  Laterality: Bilateral;   CYSTOSCOPY/URETEROSCOPY/HOLMIUM LASER/STENT PLACEMENT Right 12/07/2019   Procedure: CYSTOSCOPY/URETEROSCOPY/HOLMIUM LASER/STENT PLACEMENT;  Surgeon: Hollice Espy, MD;  Location: [facility];  Service: Urology;  Laterality: Right;   EYE SURGERY     HUMERUS FRACTURE SURGERY     KNEE ARTHROPLASTY Right 10/18/2021   Procedure: COMPUTER ASSISTED TOTAL KNEE ARTHROPLASTY;  Surgeon: Dereck Leep, MD;  Location: [facility];  Service:  Orthopedics;  Laterality: Right;   TUBAL LIGATION  1982   VAGINAL DELIVERY     WRIST FRACTURE SURGERY     Social History   Occupational History   Occupation: Doctor, hospital: RETIRED  Tobacco Use   Smoking status: Never   Smokeless tobacco: Never  Vaping Use   Vaping Use: Never used  Substance and Sexual Activity   Alcohol use: No   Drug use: No   Sexual activity: Never

## 2022-07-30 NOTE — Telephone Encounter (Signed)
Patient's granddaughter called. She is very concerned about her Grandma's medication. [place] daughter would like to speak to Janett Billow to help her understand when patient should take medication. Also see MyChart message.

## 2022-07-31 ENCOUNTER — Other Ambulatory Visit: Payer: Self-pay | Admitting: Internal Medicine

## 2022-08-10 ENCOUNTER — Ambulatory Visit: Payer: Medicare HMO | Admitting: Orthopaedic Surgery

## 2022-08-14 ENCOUNTER — Ambulatory Visit: Payer: Medicare HMO | Admitting: Orthopaedic Surgery

## 2022-08-14 DIAGNOSIS — M545 Low back pain, unspecified: Secondary | ICD-10-CM | POA: Diagnosis not present

## 2022-08-14 DIAGNOSIS — M7061 Trochanteric bursitis, right hip: Secondary | ICD-10-CM | POA: Diagnosis not present

## 2022-08-14 NOTE — Progress Notes (Signed)
Office Visit Note   Patient: [name]           Date of Birth: September 21, 1940           MRN: [id number] Visit Date: 08/14/2022              Requested by: Crecencio Mc, MD [address] [place],  [place] [address] PCP: Crecencio Mc, MD   Assessment & Plan: Visit Diagnoses: No diagnosis found.  Plan: Patient with some lumbar curvature apex L2-3 grade 1 anterolisthesis L4-5 with radiculopathy right side L5 distribution.  She does not have any weakness.  She has failed conservative treatment and has had symptoms since August.  MRI scan lumbar ordered for evaluation of right L4-5 foraminal stenosis where she has anterolisthesis.  Office follow-up after MRI scan.  Follow-Up Instructions: No follow-ups on file.   Orders:  No orders of the defined types were placed in this encounter.  No orders of the defined types were placed in this encounter.     Procedures: No procedures performed   Clinical Data: No additional findings.   Subjective: Chief Complaint  Patient presents with   Lower Back - Follow-up   Right Hip - Follow-up    No relief from injection    HPI 81 year old female returns with ongoing problems with low back pain right lateral hip pain that radiates into her leg just past her knee.  She has been taking Tylenol.  Trochanteric injection done 07/25/2022 gave her some pain relief on day 3 or 4 and then pain returned.  She is having to ambulate with a cane.  Plain radiographs show degenerative anterolisthesis at L4-5.  CT scan 2021 showed degenerative lumbosacral changes L4-5 L5-S1.  Previously treated with anti-inflammatories muscle relaxants without relief.  Cortisone no trochanteric injection.  Home therapy exercise program.  Review of Systems 14 point systems updated unchanged from 07/25/2022  visit other than as mentioned above.   Objective: Vital Signs: There were no vitals taken for this visit.  Physical Exam Constitutional:       Appearance: She is well-developed.  HENT:     Head: Normocephalic.     Right Ear: External ear normal.     Left Ear: External ear normal. There is no impacted cerumen.  Eyes:     Pupils: Pupils are equal, round, and reactive to light.  Neck:     Thyroid: No thyromegaly.     Trachea: No tracheal deviation.  Cardiovascular:     Rate and Rhythm: Normal rate.  Pulmonary:     Effort: Pulmonary effort is normal.  Abdominal:     Palpations: Abdomen is soft.  Musculoskeletal:     Cervical back: No rigidity.  Skin:    General: Skin is warm and dry.  Neurological:     Mental Status: She is alert and oriented to person, place, and time.  Psychiatric:        Behavior: Behavior normal.     Ortho Exam patient still tender over the right greater trochanter some sciatic notch tenderness on the right negative on the left sometimes straight leg raising 90 degrees negative popliteal compression test knee and ankle jerk are intact.  She is able to heel and toe walk.  Peroneals anterior tib are strong no quad weakness.  Negative logroll the hips distal pulses are intact.  Specialty Comments:  No specialty comments available.  Imaging: No results found.   PMFS History: Patient Active Problem List   Diagnosis  Date Noted   Trochanteric bursitis, right hip 07/30/2022   Sciatica 06/07/2022   B12 deficiency 03/05/2022   Nocturnal myoclonus 03/05/2022   Dizziness 01/15/2022   Morning headache 01/15/2022   Snoring 01/15/2022   Cerebral vascular disease 01/15/2022   History of CVA (cerebrovascular accident) 01/09/2022   Total knee replacement status 10/18/2021   Primary osteoarthritis of right knee 07/23/2021   Bilateral hearing loss due to cerumen impaction 02/22/2021   Thrombocytopenia ([place]) 10/19/2020   Aortic arch atherosclerosis ([place]) 04/14/2020   History of nephrolithiasis 11/03/2019   Hallux abductovalgus with bunions, right 11/23/2017   Tubular adenoma of colon 10/11/2017   Eczema  of hand 05/11/2013   Overweight (BMI 25.0-29.9) 03/26/2013   Screening for cervical cancer 11/19/2011   Routine general medical examination at a health care facility 05/02/2011   DM type 2, controlled, with complication ([place]) 33/54/5625   Hypothyroidism 02/06/2007   Hyperlipidemia associated with type 2 diabetes mellitus ([place]) 02/06/2007   Essential hypertension 02/06/2007   GERD 02/06/2007   Osteoporosis, 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   Past Medical History:  Diagnosis Date   Anxiety    Diabetes mellitus    GERD (gastroesophageal reflux disease)    History of kidney stones    Hyperlipidemia    Hypertension    Hypothyroidism    Osteoporosis    Positive colorectal cancer screening using Cologuard test 2019   Thyroid disease     Family History  Problem Relation Age of Onset   Hypertension Mother    Stroke Mother 41       hemorrhagic   Aneurysm Mother    Heart disease Father    Cancer Father        Lung CA,  died of AMI while in [place] getting Laetril   Aneurysm Sister    Depression Sister    Diabetes Brother    Hypertension Brother    Breast cancer Maternal Grandmother 81   Heart disease Maternal Grandfather    Cancer Maternal Grandfather    Hypertension Son     Past Surgical History:  Procedure Laterality Date   BREAST CYST ASPIRATION Bilateral 25+ yrs ago   CATARACT EXTRACTION W/PHACO Right 03/02/2015   Procedure: CATARACT EXTRACTION PHACO AND INTRAOCULAR LENS PLACEMENT ([place]) TORIC LENS;  Surgeon: Leandrew Koyanagi, MD;  Location: [facility];  Service: Ophthalmology;  Laterality: Right;  TORIC   CATARACT EXTRACTION W/PHACO Left 03/30/2015   Procedure: CATARACT EXTRACTION PHACO AND INTRAOCULAR LENS PLACEMENT ([facility]);  Surgeon: Leandrew Koyanagi, MD;  Location: [facility];  Service: Ophthalmology;  Laterality: Left;  TORIC  DIABETIC - oral meds   CERVICAL POLYPECTOMY     COLONOSCOPY  01/2007   Dr Olevia Perches   COLONOSCOPY WITH PROPOFOL N/A 10/23/2017    Procedure: COLONOSCOPY WITH PROPOFOL;  Surgeon: Robert Bellow, MD;  Location: [facility] ENDOSCOPY;  Service: Endoscopy;  Laterality: N/A;   CYSTOSCOPY W/ RETROGRADES Bilateral 12/07/2019   Procedure: CYSTOSCOPY WITH RETROGRADE PYELOGRAM;  Surgeon: Hollice Espy, MD;  Location: [facility];  Service: Urology;  Laterality: Bilateral;   CYSTOSCOPY/URETEROSCOPY/HOLMIUM LASER/STENT PLACEMENT Right 12/07/2019   Procedure: CYSTOSCOPY/URETEROSCOPY/HOLMIUM LASER/STENT PLACEMENT;  Surgeon: Hollice Espy, MD;  Location: [facility];  Service: Urology;  Laterality: Right;   EYE SURGERY     HUMERUS FRACTURE SURGERY     KNEE ARTHROPLASTY Right 10/18/2021   Procedure: COMPUTER ASSISTED TOTAL KNEE ARTHROPLASTY;  Surgeon: Dereck Leep, MD;  Location: [facility];  Service: Orthopedics;  Laterality: Right;   TUBAL LIGATION  1982   VAGINAL DELIVERY  WRIST FRACTURE SURGERY     Social History   Occupational History   Occupation: Doctor, hospital: RETIRED  Tobacco Use   Smoking status: Never   Smokeless tobacco: Never  Vaping Use   Vaping Use: Never used  Substance and Sexual Activity   Alcohol use: No   Drug use: No   Sexual activity: Never

## 2022-08-17 ENCOUNTER — Other Ambulatory Visit: Payer: Self-pay | Admitting: Internal Medicine

## 2022-08-29 ENCOUNTER — Ambulatory Visit
Admission: RE | Admit: 2022-08-29 | Discharge: 2022-08-29 | Disposition: A | Discharge status: Home / Self Care | Payer: Medicare HMO | Source: Ambulatory Visit | Attending: Internal Medicine | Admitting: Internal Medicine

## 2022-08-29 DIAGNOSIS — Z1231 Encounter for screening mammogram for malignant neoplasm of breast: Secondary | ICD-10-CM | POA: Insufficient documentation

## 2022-09-13 ENCOUNTER — Ambulatory Visit
Admission: RE | Admit: 2022-09-13 | Discharge: 2022-09-13 | Disposition: A | Discharge status: Home / Self Care | Payer: Medicare HMO | Source: Ambulatory Visit | Attending: Orthopaedic Surgery | Admitting: Orthopaedic Surgery

## 2022-09-13 DIAGNOSIS — M545 Low back pain, unspecified: Secondary | ICD-10-CM | POA: Diagnosis not present

## 2022-09-13 DIAGNOSIS — M4316 Spondylolisthesis, lumbar region: Secondary | ICD-10-CM | POA: Diagnosis not present

## 2022-09-13 DIAGNOSIS — M48061 Spinal stenosis, lumbar region without neurogenic claudication: Secondary | ICD-10-CM | POA: Diagnosis not present

## 2022-09-13 DIAGNOSIS — M7061 Trochanteric bursitis, right hip: Secondary | ICD-10-CM

## 2022-09-18 ENCOUNTER — Ambulatory Visit: Payer: Medicare HMO | Admitting: Orthopaedic Surgery

## 2022-09-18 DIAGNOSIS — M545 Low back pain, unspecified: Secondary | ICD-10-CM

## 2022-09-19 NOTE — Progress Notes (Signed)
Office Visit Note   Patient: [name]           Date of Birth: 05-Jul-1941           MRN: [id number] Visit Date: 09/18/2022              Requested by: Crecencio Mc, MD [address] [place],  [place] [address] PCP: Crecencio Mc, MD   Assessment & Plan: Visit Diagnoses:  1. Acute right-sided low back pain, unspecified whether sciatica present     Plan: Will proceed with epidural for the right L1-2 HNP inferior migrated.  We discussed her severe stenosis at L4-5 but currently she is not having neurogenic claudication symptoms.  She understands this might become symptomatic at some point.  She can follow-up with me after epidural.  Follow-Up Instructions: Return in about 7 weeks (around 11/06/2022).   Orders:  Orders Placed This Encounter  Procedures   Ambulatory referral to Physical Medicine Rehab   No orders of the defined types were placed in this encounter.     Procedures: No procedures performed   Clinical Data: No additional findings.   Subjective: Chief Complaint  Patient presents with   Other     Review MRI scan    HPI 82 year old female returns with ongoing problems moderate severe back pain and right lateral hip pain that radiates in the right thigh stops at the knee.  She had trochanteric injection in the past and has had to ambulate with a cane.  Previous CT scan showed some degenerative anterolisthesis of L4-5 and new MRI scan has been obtained for review today and is listed below.  This shows disc bulge with inferior extrusion at L1-2 consistent with her symptoms on the right side.  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  Review of Systems 14 point system unchanged from 07/25/2022.   Objective: Vital Signs:  There were no vitals taken for this visit.  Physical Exam Constitutional:      Appearance: She is well-developed.  HENT:     Head: Normocephalic.     Right Ear: External ear normal.     Left Ear: External ear normal. There is no impacted cerumen.  Eyes:     Pupils: Pupils are equal, round, and reactive to light.  Neck:     Thyroid: No thyromegaly.     Trachea: No tracheal deviation.  Cardiovascular:     Rate and Rhythm: Normal rate.  Pulmonary:     Effort: Pulmonary effort is normal.  Abdominal:     Palpations: Abdomen is soft.  Musculoskeletal:     Cervical back: No rigidity.  Skin:    General: Skin is warm and dry.  Neurological:     Mental Status: She is alert and oriented to person, place, and time.  Psychiatric:        Behavior: Behavior normal.     Ortho Exam decreased sensation anterior right thigh negative straight leg raising 90 degrees anterior tib gastrocsoleus is intact.  Specialty Comments:  MRI LUMBAR SPINE WITHOUT CONTRAST   TECHNIQUE: Multiplanar, multisequence MR imaging of the lumbar spine was performed. No intravenous contrast was administered.   COMPARISON:  Lumbar spine radiographs 07/25/2022, CT abdomen/pelvis 11/03/2019   FINDINGS: Segmentation:  Standard; the lowest formed disc space is designated L5-S1.   Alignment: There is mild S shaped curvature of the lumbar spine. There is trace retrolisthesis of L1 on L2 and L2 on L3, and trace anterolisthesis of L4 on L5.   Vertebrae: Vertebral body heights are preserved, without evidence of acute injury. Background marrow signal is normal. There is no suspicious marrow signal abnormality or marrow edema.   Conus medullaris and cauda equina: Conus extends to the T12-L1 level. Conus and cauda equina appear normal.   Paraspinal and other soft tissues: A left renal cyst is noted requiring no specific imaging follow-up. The paraspinal soft tissues are unremarkable.   Disc levels:   There is  advanced disc desiccation and narrowing at L2-L3. There is more mild disc degeneration at the other levels.   T12-L1: There is a broad-based central protrusion without significant spinal canal or neural foraminal stenosis   L1-L2: There is a disc bulge with a right subarticular zone inferiorly migrated extrusion extending approximately 1.0 cm below the level of the disc space resulting in right subarticular zone effacement with impingement of the traversing L2 nerve root (8-12). There is mild spinal canal stenosis and mild right and no significant left neural foraminal stenosis   L2-L3: There is a disc bulge and mild-to-moderate bilateral facet arthropathy with ligamentum flavum thickening resulting in moderate to severe spinal canal stenosis with left subarticular zone effacement and likely impingement of the traversing left L3 nerve root, and mild bilateral neural foraminal stenosis   L3-L4: There is a disc bulge with a right foraminal annular fissure and moderate bilateral facet arthropathy resulting in moderate spinal canal stenosis with left worse than right subarticular zone narrowing, and mild bilateral neural foraminal stenosis   L4-L5: There is grade 1 anterolisthesis with uncovering of the disc posteriorly and a superimposed bulge and left foraminal annular fissure, and advanced bilateral facet arthropathy with ligamentum flavum thickening and a trace right effusion resulting in severe spinal canal stenosis and moderate left and no significant right neural foraminal stenosis   L5-S1: There is moderate right worse than left facet arthropathy with trace effusions and a mild disc bulge with a left subarticular zone annular fissure resulting in left worse than right subarticular zone narrowing with possible irritation of the traversing S1 nerve roots, and no significant neural foraminal stenosis.   IMPRESSION: 1. Inferiorly migrated right subarticular zone extrusion at  L1-L2 resulting in right subarticular zone effacement with impingement of the traversing L2 nerve root. 2. 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 3. Multifactorial moderate spinal canal stenosis with left worse than right subarticular zone narrowing at L3-L4. 4. Grade 1 anterolisthesis and advanced facet arthropathy at L4-L5 resulting in severe spinal canal stenosis and moderate left neural foraminal stenosis. 5. Moderate right worse than left facet arthropathy at L5-S1 with left worse than right subarticular zone narrowing and possible irritation of the traversing S1 nerve roots.     Electronically Signed   By: Valetta Mole M.D.   On: 09/14/2022 14:56  Imaging: No results found.   PMFS History: Patient Active Problem List   Diagnosis Date Noted   Trochanteric bursitis, right hip 07/30/2022   Sciatica 06/07/2022   B12 deficiency 03/05/2022   Nocturnal myoclonus 03/05/2022   Dizziness 01/15/2022   Morning headache 01/15/2022   Snoring 01/15/2022   Cerebral vascular disease 01/15/2022   History of CVA (  cerebrovascular accident) 01/09/2022   Total knee replacement status 10/18/2021   Primary osteoarthritis of right knee 07/23/2021   Bilateral hearing loss due to cerumen impaction 02/22/2021   Thrombocytopenia ([place]) 10/19/2020   Aortic arch atherosclerosis ([place]) 04/14/2020   History of nephrolithiasis 11/03/2019   Hallux abductovalgus with bunions, right 11/23/2017   Tubular adenoma of colon 10/11/2017   Eczema of hand 05/11/2013   Overweight (BMI 25.0-29.9) 03/26/2013   Screening for cervical cancer 11/19/2011   Routine general medical examination at a health care facility 05/02/2011   DM type 2, controlled, with complication ([place]) 41/96/2229   Hypothyroidism 02/06/2007   Hyperlipidemia associated with type 2 diabetes mellitus ([place]) 02/06/2007    Essential hypertension 02/06/2007   GERD 02/06/2007   Osteoporosis, 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   Past Medical History:  Diagnosis Date   Anxiety    Diabetes mellitus    GERD (gastroesophageal reflux disease)    History of kidney stones    Hyperlipidemia    Hypertension    Hypothyroidism    Osteoporosis    Positive colorectal cancer screening using Cologuard test 2019   Thyroid disease     Family History  Problem Relation Age of Onset   Hypertension Mother    Stroke Mother 13       hemorrhagic   Aneurysm Mother    Heart disease Father    Cancer Father        Lung CA,  died of AMI while in [place] getting Laetril   Aneurysm Sister    Depression Sister    Diabetes Brother    Hypertension Brother    Breast cancer Maternal Grandmother 57   Heart disease Maternal Grandfather    Cancer Maternal Grandfather    Hypertension Son     Past Surgical History:  Procedure Laterality Date   BREAST CYST ASPIRATION Bilateral 25+ yrs ago   CATARACT EXTRACTION W/PHACO Right 03/02/2015   Procedure: CATARACT EXTRACTION PHACO AND INTRAOCULAR LENS PLACEMENT ([place]) TORIC LENS;  Surgeon: Leandrew Koyanagi, MD;  Location: [facility];  Service: Ophthalmology;  Laterality: Right;  TORIC   CATARACT EXTRACTION W/PHACO Left 03/30/2015   Procedure: CATARACT EXTRACTION PHACO AND INTRAOCULAR LENS PLACEMENT ([facility]);  Surgeon: Leandrew Koyanagi, MD;  Location: [facility];  Service: Ophthalmology;  Laterality: Left;  TORIC  DIABETIC - oral meds   CERVICAL POLYPECTOMY     COLONOSCOPY  01/2007   Dr Olevia Perches   COLONOSCOPY WITH PROPOFOL N/A 10/23/2017   Procedure: COLONOSCOPY WITH PROPOFOL;  Surgeon: Robert Bellow, MD;  Location: [facility]COPY;  Service: Endoscopy;  Laterality: N/A;   CYSTOSCOPY W/ RETROGRADES Bilateral 12/07/2019   Procedure: CYSTOSCOPY WITH RETROGRADE PYELOGRAM;  Surgeon: Hollice Espy, MD;  Location: [facility];  Service: Urology;  Laterality: Bilateral;    CYSTOSCOPY/URETEROSCOPY/HOLMIUM LASER/STENT PLACEMENT Right 12/07/2019   Procedure: CYSTOSCOPY/URETEROSCOPY/HOLMIUM LASER/STENT PLACEMENT;  Surgeon: Hollice Espy, MD;  Location: [facility];  Service: Urology;  Laterality: Right;   EYE SURGERY     HUMERUS FRACTURE SURGERY     KNEE ARTHROPLASTY Right 10/18/2021   Procedure: COMPUTER ASSISTED TOTAL KNEE ARTHROPLASTY;  Surgeon: Dereck Leep, MD;  Location: [facility];  Service: Orthopedics;  Laterality: Right;   TUBAL LIGATION  1982   VAGINAL DELIVERY     WRIST FRACTURE SURGERY     Social History   Occupational History   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

## 2022-09-26 ENCOUNTER — Telehealth: Payer: Self-pay

## 2022-09-26 ENCOUNTER — Other Ambulatory Visit: Payer: Self-pay | Admitting: Physical Medicine and Rehabilitation

## 2022-09-26 MED ORDER — DIAZEPAM 5 MG PO TABS: ORAL_TABLET | ORAL | 0 refills | Status: DC

## 2022-09-26 NOTE — Telephone Encounter (Signed)
Patient needs Valium sent to pharmacy for procedure on 10/03/22

## 2022-10-03 ENCOUNTER — Ambulatory Visit: Payer: Medicare HMO | Admitting: Physical Medicine and Rehabilitation

## 2022-10-03 ENCOUNTER — Ambulatory Visit: Payer: Self-pay

## 2022-10-03 VITALS — BP 143/61 | HR 46

## 2022-10-03 DIAGNOSIS — M5416 Radiculopathy, lumbar region: Secondary | ICD-10-CM

## 2022-10-03 MED ORDER — DEXAMETHASONE SODIUM PHOSPHATE 10 MG/ML IJ SOLN
15.0000 mg | Freq: Once | INTRAMUSCULAR | Status: AC
  Administered 2022-10-03: 15 mg

## 2022-10-03 NOTE — Progress Notes (Signed)
Functional Pain Scale - descriptive words and definitions  Uncomfortable (3)  Pain is present but can complete all ADL's/sleep is slightly affected and passive distraction only gives marginal relief. Mild range order  Average Pain  varies   +Driver, -BT, -Dye Allergies.  Lower back pain on right side that radiates down the right leg to the knee and into the pelvis

## 2022-10-03 NOTE — Patient Instructions (Signed)

## 2022-10-03 NOTE — Progress Notes (Signed)
[name] - 82 y.o. female MRN [id number]  Date of birth: 1940/11/24  Office Visit Note: Visit Date: 10/03/2022 PCP: Crecencio Mc, MD Referred by: Crecencio Mc, MD  Subjective: Chief Complaint  Patient presents with   Lower Back - Pain   HPI:  [name] is a 82 y.o. female who comes in today at the request of Dr. Rodell Perna for planned Right L2-3 Lumbar Transforaminal epidural steroid injection with fluoroscopic guidance.  The patient has failed conservative care including home exercise, medications, time and activity modification.  This injection will be diagnostic and hopefully therapeutic.  Please see requesting physician notes for further details and justification.  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  Depending on relief today could look at lower injection around L4.   ROS Otherwise per HPI.  Assessment & Plan: Visit Diagnoses:    ICD-10-CM   1. Lumbar radiculopathy  M54.16 XR C-ARM NO REPORT    Epidural Steroid injection    dexamethasone (DECADRON) injection 15 mg      Plan: No additional findings.   Meds & Orders:  Meds ordered this encounter  Medications   dexamethasone (DECADRON) injection 15 mg    Orders Placed This Encounter  Procedures   XR C-ARM NO REPORT   Epidural Steroid injection    Follow-up: Return for visit to requesting provider as needed.   Procedures: No procedures performed  Lumbosacral Transforaminal Epidural Steroid Injection - Sub-Pedicular Approach with Fluoroscopic Guidance  Patient: [name]      Date of Birth: 09-Apr-1941 MRN: [id number] PCP: Crecencio Mc, MD      Visit Date: 10/03/2022   Universal Protocol:    Date/Time: 10/03/2022  Consent Given By: the patient  Position: PRONE  Additional Comments: Vital signs were monitored before and after the procedure. Patient was  prepped and draped in the usual sterile fashion. The correct patient, procedure, and site was verified.   Injection Procedure Details:   Procedure diagnoses: Lumbar radiculopathy [M54.16]    Meds Administered:  Meds ordered this encounter  Medications   dexamethasone (DECADRON) injection 15 mg    Laterality: Right  Location/Site: L2  Needle:3.5 in., 22 ga.  Short bevel or Quincke spinal needle  Needle Placement: Transforaminal  Findings:    -Comments: Excellent flow of contrast along the nerve, nerve root and into the epidural space.  Procedure Details: After squaring off the end-plates to get a true AP view, the C-arm was positioned so that an oblique view of the foramen as noted above was visualized. The target area is just inferior to the "nose of the scotty dog" or sub pedicular. The soft tissues overlying this structure were infiltrated with 2-3 ml. of 1% Lidocaine without Epinephrine.  The spinal needle was inserted toward the target using a "trajectory" view along the fluoroscope beam.  Under AP and lateral visualization, the needle was advanced so it did not puncture dura and was located close the 6 O'Clock position of the pedical in AP tracterory. Biplanar projections were used to confirm position. Aspiration was confirmed to be negative for CSF and/or blood. A 1-2 ml. volume of Isovue-250 was injected and flow of contrast was noted at each level. Radiographs were obtained for documentation purposes.   After attaining the desired flow of contrast documented above, a 0.5 to 1.0 ml test dose of 0.25%  Marcaine was injected into each respective transforaminal space.  The patient was observed for 90 seconds post injection.  After no sensory deficits were reported, and normal lower extremity motor function was noted,   the above injectate was administered so that equal amounts of the injectate were placed at each foramen (level) into the transforaminal epidural  space.   Additional Comments:  No complications occurred Dressing: 2 x 2 sterile gauze and Band-Aid    Post-procedure details: Patient was observed during the procedure. Post-procedure instructions were reviewed.  Patient left the clinic in stable condition.    Clinical History: MRI LUMBAR SPINE WITHOUT CONTRAST   TECHNIQUE: Multiplanar, multisequence MR imaging of the lumbar spine was performed. No intravenous contrast was administered.   COMPARISON:  Lumbar spine radiographs 07/25/2022, CT abdomen/pelvis 11/03/2019   FINDINGS: Segmentation: Standard; the lowest formed disc space is designated L5-S1.   Alignment: There is mild S shaped curvature of the lumbar spine. There is trace retrolisthesis of L1 on L2 and L2 on L3, and trace anterolisthesis of L4 on L5.   Vertebrae: Vertebral body heights are preserved, without evidence of acute injury. Background marrow signal is normal. There is no suspicious marrow signal abnormality or marrow edema.   Conus medullaris and cauda equina: Conus extends to the T12-L1 level. Conus and cauda equina appear normal.   Paraspinal and other soft tissues: A left renal cyst is noted requiring no specific imaging follow-up. The paraspinal soft tissues are unremarkable.   Disc levels:   There is advanced disc desiccation and narrowing at L2-L3. There is more mild disc degeneration at the other levels.   T12-L1: There is a broad-based central protrusion without significant spinal canal or neural foraminal stenosis   L1-L2: There is a disc bulge with a right subarticular zone inferiorly migrated extrusion extending approximately 1.0 cm below the level of the disc space resulting in right subarticular zone effacement with impingement of the traversing L2 nerve root (8-12). There is mild spinal canal stenosis and mild right and no significant left neural foraminal stenosis   L2-L3: There is a disc bulge and mild-to-moderate  bilateral facet arthropathy with ligamentum flavum thickening resulting in moderate to severe spinal canal stenosis with left subarticular zone effacement and likely impingement of the traversing left L3 nerve root, and mild bilateral neural foraminal stenosis   L3-L4: There is a disc bulge with a right foraminal annular fissure and moderate bilateral facet arthropathy resulting in moderate spinal canal stenosis with left worse than right subarticular zone narrowing, and mild bilateral neural foraminal stenosis   L4-L5: There is grade 1 anterolisthesis with uncovering of the disc posteriorly and a superimposed bulge and left foraminal annular fissure, and advanced bilateral facet arthropathy with ligamentum flavum thickening and a trace right effusion resulting in severe spinal canal stenosis and moderate left and no significant right neural foraminal stenosis   L5-S1: There is moderate right worse than left facet arthropathy with trace effusions and a mild disc bulge with a left subarticular zone annular fissure resulting in left worse than right subarticular zone narrowing with possible irritation of the traversing S1 nerve roots, and no significant neural foraminal stenosis.   IMPRESSION: 1. Inferiorly migrated right subarticular zone extrusion at L1-L2 resulting in right subarticular zone effacement with impingement of the traversing L2 nerve root. 2. 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 3. Multifactorial moderate spinal canal stenosis  with left worse than right subarticular zone narrowing at L3-L4. 4. Grade 1 anterolisthesis and advanced facet arthropathy at L4-L5 resulting in severe spinal canal stenosis and moderate left neural foraminal stenosis. 5. Moderate right worse than left facet arthropathy at L5-S1 with left worse than right subarticular  zone narrowing and possible irritation of the traversing S1 nerve roots.     Electronically Signed   By: Valetta Mole M.D.   On: 09/14/2022 14:56     Objective:  VS:  HT:    WT:   BMI:     BP:(!) 143/61  HR:(!) 46bpm  TEMP: ( )  RESP:  Physical Exam Vitals and nursing note reviewed.  Constitutional:      General: She is not in acute distress.    Appearance: Normal appearance. She is not ill-appearing.  HENT:     Head: Normocephalic and atraumatic.     Right Ear: External ear normal.     Left Ear: External ear normal.  Eyes:     Extraocular Movements: Extraocular movements intact.  Cardiovascular:     Rate and Rhythm: Normal rate.     Pulses: Normal pulses.  Pulmonary:     Effort: Pulmonary effort is normal. No respiratory distress.  Abdominal:     General: There is no distension.     Palpations: Abdomen is soft.  Musculoskeletal:        General: Tenderness present.     Cervical back: Neck supple.     Right lower leg: No edema.     Left lower leg: No edema.     Comments: Patient has good distal strength with no pain over the greater trochanters.  No clonus or focal weakness.  Skin:    Findings: No erythema, lesion or rash.  Neurological:     General: No focal deficit present.     Mental Status: She is alert and oriented to person, place, and time.     Sensory: No sensory deficit.     Motor: No weakness or abnormal muscle tone.     Coordination: Coordination normal.  Psychiatric:        Mood and Affect: Mood normal.        Behavior: Behavior normal.      Imaging: XR C-ARM NO REPORT  Result Date: 10/03/2022 Please see Notes tab for imaging impression.

## 2022-10-03 NOTE — Procedures (Signed)
Lumbosacral Transforaminal Epidural Steroid Injection - Sub-Pedicular Approach with Fluoroscopic Guidance  Patient: [name]      Date of Birth: 1941/07/11 MRN: [id number] PCP: Crecencio Mc, MD      Visit Date: 10/03/2022   Universal Protocol:    Date/Time: 10/03/2022  Consent Given By: the patient  Position: PRONE  Additional Comments: Vital signs were monitored before and after the procedure. Patient was prepped and draped in the usual sterile fashion. The correct patient, procedure, and site was verified.   Injection Procedure Details:   Procedure diagnoses: Lumbar radiculopathy [M54.16]    Meds Administered:  Meds ordered this encounter  Medications   dexamethasone (DECADRON) injection 15 mg    Laterality: Right  Location/Site: L2  Needle:3.5 in., 22 ga.  Short bevel or Quincke spinal needle  Needle Placement: Transforaminal  Findings:    -Comments: Excellent flow of contrast along the nerve, nerve root and into the epidural space.  Procedure Details: After squaring off the end-plates to get a true AP view, the C-arm was positioned so that an oblique view of the foramen as noted above was visualized. The target area is just inferior to the "nose of the scotty dog" or sub pedicular. The soft tissues overlying this structure were infiltrated with 2-3 ml. of 1% Lidocaine without Epinephrine.  The spinal needle was inserted toward the target using a "trajectory" view along the fluoroscope beam.  Under AP and lateral visualization, the needle was advanced so it did not puncture dura and was located close the 6 O'Clock position of the pedical in AP tracterory. Biplanar projections were used to confirm position. Aspiration was confirmed to be negative for CSF and/or blood. A 1-2 ml. volume of Isovue-250 was injected and flow of contrast was noted at each level. Radiographs were obtained for documentation purposes.   After attaining the desired flow of contrast  documented above, a 0.5 to 1.0 ml test dose of 0.25% Marcaine was injected into each respective transforaminal space.  The patient was observed for 90 seconds post injection.  After no sensory deficits were reported, and normal lower extremity motor function was noted,   the above injectate was administered so that equal amounts of the injectate were placed at each foramen (level) into the transforaminal epidural space.   Additional Comments:  No complications occurred Dressing: 2 x 2 sterile gauze and Band-Aid    Post-procedure details: Patient was observed during the procedure. Post-procedure instructions were reviewed.  Patient left the clinic in stable condition.

## 2022-10-07 ENCOUNTER — Other Ambulatory Visit: Payer: Self-pay | Admitting: Internal Medicine

## 2022-10-25 DIAGNOSIS — E1169 Type 2 diabetes mellitus with other specified complication: Secondary | ICD-10-CM | POA: Diagnosis not present

## 2022-10-25 DIAGNOSIS — E785 Hyperlipidemia, unspecified: Secondary | ICD-10-CM | POA: Diagnosis not present

## 2022-10-25 DIAGNOSIS — I700 Atherosclerosis of aorta: Secondary | ICD-10-CM | POA: Diagnosis not present

## 2022-10-25 DIAGNOSIS — Z96651 Presence of right artificial knee joint: Secondary | ICD-10-CM | POA: Diagnosis not present

## 2022-11-19 ENCOUNTER — Telehealth: Payer: Self-pay | Admitting: *Deleted

## 2022-11-19 NOTE — Telephone Encounter (Signed)
$  301 due, PA required (the one on file ends on 12/05/22). Pt due for next Prolia on or after 12/27/22.  Sent to PA team & sent mychart message

## 2022-11-20 NOTE — Telephone Encounter (Signed)
Submitted PA for Prolia via [employer]. Status pending.  Authorization Number : [id number]

## 2022-11-20 NOTE — Telephone Encounter (Signed)
Patient Advocate Encounter  Prior Authorization for Prolia 60mg  has been approved.    PA# [id number] Effective dates: 11/20/22 through 11/20/23

## 2022-11-27 ENCOUNTER — Other Ambulatory Visit: Payer: Self-pay | Admitting: Internal Medicine

## 2022-11-27 ENCOUNTER — Encounter: Payer: Self-pay | Admitting: Internal Medicine

## 2022-12-06 ENCOUNTER — Encounter: Payer: Self-pay | Admitting: Internal Medicine

## 2022-12-06 ENCOUNTER — Ambulatory Visit (INDEPENDENT_AMBULATORY_CARE_PROVIDER_SITE_OTHER): Payer: Medicare HMO | Admitting: Internal Medicine

## 2022-12-06 VITALS — BP 130/72 | HR 65 | Temp 97.6°F | Ht 63.0 in | Wt 150.1 lb

## 2022-12-06 DIAGNOSIS — E1169 Type 2 diabetes mellitus with other specified complication: Secondary | ICD-10-CM

## 2022-12-06 DIAGNOSIS — M5430 Sciatica, unspecified side: Secondary | ICD-10-CM | POA: Diagnosis not present

## 2022-12-06 DIAGNOSIS — E118 Type 2 diabetes mellitus with unspecified complications: Secondary | ICD-10-CM | POA: Diagnosis not present

## 2022-12-06 DIAGNOSIS — E034 Atrophy of thyroid (acquired): Secondary | ICD-10-CM | POA: Diagnosis not present

## 2022-12-06 DIAGNOSIS — R5383 Other fatigue: Secondary | ICD-10-CM | POA: Diagnosis not present

## 2022-12-06 DIAGNOSIS — I10 Essential (primary) hypertension: Secondary | ICD-10-CM | POA: Diagnosis not present

## 2022-12-06 DIAGNOSIS — E785 Hyperlipidemia, unspecified: Secondary | ICD-10-CM

## 2022-12-06 LAB — COMPREHENSIVE METABOLIC PANEL WITH GFR
ALT: 9 U/L (ref 0–35)
AST: 13 U/L (ref 0–37)
Albumin: 4.4 g/dL (ref 3.5–5.2)
Alkaline Phosphatase: 58 U/L (ref 39–117)
BUN: 27 mg/dL — ABNORMAL HIGH (ref 6–23)
CO2: 27 meq/L (ref 19–32)
Calcium: 9.7 mg/dL (ref 8.4–10.5)
Chloride: 106 meq/L (ref 96–112)
Creatinine, Ser: 0.74 mg/dL (ref 0.40–1.20)
GFR: 76 mL/min
Glucose, Bld: 102 mg/dL — ABNORMAL HIGH (ref 70–99)
Potassium: 4.2 meq/L (ref 3.5–5.1)
Sodium: 141 meq/L (ref 135–145)
Total Bilirubin: 0.5 mg/dL (ref 0.2–1.2)
Total Protein: 6.4 g/dL (ref 6.0–8.3)

## 2022-12-06 LAB — CBC WITH DIFFERENTIAL/PLATELET
Basophils Relative: 1 % (ref 0.0–3.0)
Eosinophils Relative: 4 % (ref 0.0–5.0)
HCT: 37.4 % (ref 36.0–46.0)
Hemoglobin: 12.7 g/dL (ref 12.0–15.0)
Lymphocytes Relative: 26 % (ref 12.0–46.0)
MCHC: 33.9 g/dL (ref 30.0–36.0)
MCV: 92.4 fl (ref 78.0–100.0)
Monocytes Relative: 13 % — ABNORMAL HIGH (ref 3.0–12.0)
Neutrophils Relative %: 56 % (ref 43.0–77.0)
Platelets: 160 10*3/uL (ref 150.0–400.0)
RBC: 4.05 Mil/uL (ref 3.87–5.11)
RDW: 13.2 % (ref 11.5–15.5)
WBC: 5.2 10*3/uL (ref 4.0–10.5)

## 2022-12-06 LAB — LDL CHOLESTEROL, DIRECT: Direct LDL: 55 mg/dL

## 2022-12-06 LAB — LIPID PANEL
Cholesterol: 143 mg/dL (ref 0–200)
HDL: 60.6 mg/dL
LDL Cholesterol: 59 mg/dL (ref 0–99)
NonHDL: 82.68
Total CHOL/HDL Ratio: 2
Triglycerides: 118 mg/dL (ref 0.0–149.0)
VLDL: 23.6 mg/dL (ref 0.0–40.0)

## 2022-12-06 LAB — MICROALBUMIN / CREATININE URINE RATIO
Creatinine,U: 68 mg/dL
Microalb Creat Ratio: 1.3 mg/g (ref 0.0–30.0)
Microalb, Ur: 0.9 mg/dL (ref 0.0–1.9)

## 2022-12-06 LAB — TSH: TSH: 1.79 u[IU]/mL (ref 0.35–5.50)

## 2022-12-06 LAB — HEMOGLOBIN A1C: Hgb A1c MFr Bld: 5.7 % (ref 4.6–6.5)

## 2022-12-06 MED ORDER — PIOGLITAZONE HCL 15 MG PO TABS: 15.0000 mg | ORAL_TABLET | Freq: Every day | ORAL | 1 refills | Status: DC

## 2022-12-06 MED ORDER — GLIPIZIDE 5 MG PO TABS: ORAL_TABLET | ORAL | 1 refills | Status: DC

## 2022-12-06 MED ORDER — LEVOTHYROXINE SODIUM 88 MCG PO TABS: ORAL_TABLET | ORAL | 3 refills | Status: DC

## 2022-12-06 MED ORDER — CELECOXIB 200 MG PO CAPS: 200.0000 mg | ORAL_CAPSULE | Freq: Two times a day (BID) | ORAL | 1 refills | Status: DC

## 2022-12-06 MED ORDER — ALPRAZOLAM 0.25 MG PO TABS: 0.2500 mg | ORAL_TABLET | Freq: Two times a day (BID) | ORAL | 0 refills | Status: DC | PRN

## 2022-12-06 MED ORDER — ATORVASTATIN CALCIUM 20 MG PO TABS: 20.0000 mg | ORAL_TABLET | Freq: Every day | ORAL | 3 refills | Status: DC

## 2022-12-06 NOTE — Patient Instructions (Addendum)
  You can continue taking 2000  to 3000  mg of acetominophen (tylenol) every day safely  In divided doses  for your pain .    Try Salon Pas  patch with lidocaine  you can use one daily   I have made a referral to [facility] therapy for your back pain

## 2022-12-06 NOTE — Progress Notes (Signed)
Subjective:  Patient ID: [name], female    DOB: 1941/03/14  Age: 82 y.o. MRN: [id number]  CC: The primary encounter diagnosis was Essential hypertension. Diagnoses of Hypothyroidism due to acquired atrophy of thyroid, Hyperlipidemia associated with type 2 diabetes mellitus, DM type 2, controlled, with complication, Other fatigue, and Sciatica, unspecified laterality were also pertinent to this visit.   HPI [name] presents for  Chief Complaint  Patient presents with   Medical Management of Chronic Issues   1) T2DM:  She feels generally well, is walking several times per week and checking blood sugars once daily at variable times.  BS have been under 130 fasting and < 150 post prandially.  Denies any recent hypoglyemic events.  Taking Actos , metformin and glipizide  as directed. Following a carbohydrate modified diet 6 days per week. Denies numbness, burning and tingling of extremities. Appetite is good.     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 Currently. .  She  has had 2 ESI's with very transient relief (several days only)  . Marland Kitchen Only taking tylenol 1300 mg bid .    Tramadol and tizanidine were not tolerated due to dizziness and hallucination.     Outpatient Medications Prior to Visit  Medication Sig Dispense Refill   aspirin EC 81 MG tablet Take 1 tablet (81 mg total) by mouth daily. Swallow whole.     Blood Glucose Monitoring Suppl (ONE TOUCH ULTRA 2) w/Device KIT Use to check blood sugars once daily. 1 kit 0   cholecalciferol (VITAMIN D) 25 MCG (1000 UNIT) tablet Take 2 tablets (2,000 Units total) by mouth daily. 90 tablet 1   cyanocobalamin (VITAMIN B12) 1000 MCG/ML injection Inject 1 mL (1,000 mcg total) into the muscle every 30 (thirty) days. 3 mL 3   losartan (COZAAR) 50 MG tablet TAKE 1 TABLET BY MOUTH EVERY DAY 90 tablet 3   metFORMIN (GLUCOPHAGE-XR)  500 MG 24 hr tablet TAKE 1 TABLET BY MOUTH EVERY DAY WITH BREAKFAST 90 tablet 3   ONETOUCH ULTRA test strip USE AS DIRECTED 100 strip 10   Syringe/Needle, Disp, (SYRINGE 3CC/25GX1") 25G X 1" 3 ML MISC Use for b12 injections 50 each 0   ALPRAZolam (XANAX) 0.25 MG tablet Take 0.5 tablets (0.125 mg total) by mouth at bedtime as needed for anxiety. 15 tablet 5   atorvastatin (LIPITOR) 20 MG tablet TAKE 1 TABLET BY MOUTH EVERY DAY 90 tablet 3   celecoxib (CELEBREX) 200 MG capsule TAKE 1 CAPSULE BY MOUTH TWICE A DAY (Patient not taking: Reported on 12/06/2022) 60 capsule 1   diazepam (VALIUM) 5 MG tablet Take one tablet by mouth with food one hour prior to procedure. May repeat 30 minutes prior if needed. (Patient not taking: Reported on 12/06/2022) 2 tablet 0   glipiZIDE (GLUCOTROL) 5 MG tablet TAKE 1/2 TABLET BY MOUTH AT BREAKFAST, AND 2 TABLETS AT DINNERTIME 270 tablet 1   levothyroxine (SYNTHROID) 88 MCG tablet TAKE 1 TABLET BY MOUTH EVERY DAY ON EMPTY STOMACH WITH WATER AT LEAST 30-60 MINS BEFORE BREAKFAST 90 tablet 3   meclizine (ANTIVERT) 25 MG tablet Take 0.5-1 tablets (12.5-25 mg total) by mouth 2 (two) times daily as needed for dizziness. (Patient not taking: Reported on 12/06/2022) 60 tablet 2   pioglitazone (ACTOS) 15 MG tablet TAKE 1 TABLET (15 MG TOTAL) BY MOUTH DAILY WITH SUPPER 90  tablet 1   tiZANidine (ZANAFLEX) 4 MG tablet TAKE 1 TABLET BY MOUTH EVERY 6 HOURS AS NEEDED FOR MUSCLE SPASMS. (Patient not taking: Reported on 12/06/2022) 30 tablet 0   traMADol (ULTRAM) 50 MG tablet Take 50-100 mg by mouth 2 (two) times daily as needed. (Patient not taking: Reported on 12/06/2022)     No facility-administered medications prior to visit.    Review of Systems;  Patient denies headache, fevers, malaise, unintentional weight loss, skin rash, eye pain, sinus congestion and sinus pain, sore throat, dysphagia,  hemoptysis , cough, dyspnea, wheezing, chest pain, palpitations, orthopnea, edema, abdominal pain,  nausea, melena, diarrhea, constipation, flank pain, dysuria, hematuria, urinary  Frequency, nocturia, numbness, tingling, seizures,  Focal weakness, Loss of consciousness,  Tremor, insomnia, depression, anxiety, and suicidal ideation.      Objective:  BP 130/72   Pulse 65   Temp 97.6 F (36.4 C) (Oral)   Ht 5\' 3"  (1.6 m)   Wt 150 lb 1.6 oz (68.1 kg)   SpO2 98%   BMI 26.59 kg/m   BP Readings from Last 3 Encounters:  12/06/22 130/72  10/03/22 (!) 143/61  07/25/22 130/79    Wt Readings from Last 3 Encounters:  12/06/22 150 lb 1.6 oz (68.1 kg)  07/25/22 147 lb (66.7 kg)  06/06/22 151 lb 12.8 oz (68.9 kg)    Physical Exam Vitals reviewed.  Constitutional:      General: She is not in acute distress.    Appearance: Normal appearance. She is normal weight. She is not ill-appearing, toxic-appearing or diaphoretic.  HENT:     Head: Normocephalic.  Eyes:     General: No scleral icterus.       Right eye: No discharge.        Left eye: No discharge.     Conjunctiva/sclera: Conjunctivae normal.  Cardiovascular:     Rate and Rhythm: Normal rate and regular rhythm.     Heart sounds: Normal heart sounds.  Pulmonary:     Effort: Pulmonary effort is normal. No respiratory distress.     Breath sounds: Normal breath sounds.  Musculoskeletal:        General: Normal range of motion.  Skin:    General: Skin is warm and dry.  Neurological:     General: No focal deficit present.     Mental Status: She is alert and oriented to person, place, and time. Mental status is at baseline.  Psychiatric:        Mood and Affect: Mood normal.        Behavior: Behavior normal.        Thought Content: Thought content normal.        Judgment: Judgment normal.    Lab Results  Component Value Date   HGBA1C 5.7 12/06/2022   HGBA1C 6.1 06/06/2022   HGBA1C 6.1 03/05/2022    Lab Results  Component Value Date   CREATININE 0.74 12/06/2022   CREATININE 0.84 06/06/2022   CREATININE 0.78 03/05/2022     Lab Results  Component Value Date   WBC 5.2 12/06/2022   HGB 12.7 12/06/2022   HCT 37.4 12/06/2022   PLT 160.0 12/06/2022   GLUCOSE 102 (H) 12/06/2022   CHOL 143 12/06/2022   TRIG 118.0 12/06/2022   HDL 60.60 12/06/2022   LDLDIRECT 55.0 12/06/2022   LDLCALC 59 12/06/2022   ALT 9 12/06/2022   AST 13 12/06/2022   NA 141 12/06/2022   K 4.2 12/06/2022   CL 106 12/06/2022   CREATININE  0.74 12/06/2022   BUN 27 (H) 12/06/2022   CO2 27 12/06/2022   TSH 1.79 12/06/2022   HGBA1C 5.7 12/06/2022   MICROALBUR 0.9 12/06/2022    MR Lumbar Spine w/o contrast  Result Date: 09/14/2022 CLINICAL DATA:  Low back pain radiating to the right knee for 4 months. EXAM: MRI LUMBAR SPINE WITHOUT CONTRAST TECHNIQUE: Multiplanar, multisequence MR imaging of the lumbar spine was performed. No intravenous contrast was administered. COMPARISON:  Lumbar spine radiographs 07/25/2022, CT abdomen/pelvis 11/03/2019 FINDINGS: Segmentation: Standard; the lowest formed disc space is designated L5-S1. Alignment: There is mild S shaped curvature of the lumbar spine. There is trace retrolisthesis of L1 on L2 and L2 on L3, and trace anterolisthesis of L4 on L5. Vertebrae: Vertebral body heights are preserved, without evidence of acute injury. Background marrow signal is normal. There is no suspicious marrow signal abnormality or marrow edema. Conus medullaris and cauda equina: Conus extends to the T12-L1 level. Conus and cauda equina appear normal. Paraspinal and other soft tissues: A left renal cyst is noted requiring no specific imaging follow-up. The paraspinal soft tissues are unremarkable. Disc levels: There is advanced disc desiccation and narrowing at L2-L3. There is more mild disc degeneration at the other levels. T12-L1: There is a broad-based central protrusion without significant spinal canal or neural foraminal stenosis L1-L2: There is a disc bulge with a right subarticular zone inferiorly migrated extrusion  extending approximately 1.0 cm below the level of the disc space resulting in right subarticular zone effacement with impingement of the traversing L2 nerve root (8-12). There is mild spinal canal stenosis and mild right and no significant left neural foraminal stenosis L2-L3: There is a disc bulge and mild-to-moderate bilateral facet arthropathy with ligamentum flavum thickening resulting in moderate to severe spinal canal stenosis with left subarticular zone effacement and likely impingement of the traversing left L3 nerve root, and mild bilateral neural foraminal stenosis L3-L4: There is a disc bulge with a right foraminal annular fissure and moderate bilateral facet arthropathy resulting in moderate spinal canal stenosis with left worse than right subarticular zone narrowing, and mild bilateral neural foraminal stenosis L4-L5: There is grade 1 anterolisthesis with uncovering of the disc posteriorly and a superimposed bulge and left foraminal annular fissure, and advanced bilateral facet arthropathy with ligamentum flavum thickening and a trace right effusion resulting in severe spinal canal stenosis and moderate left and no significant right neural foraminal stenosis L5-S1: There is moderate right worse than left facet arthropathy with trace effusions and a mild disc bulge with a left subarticular zone annular fissure resulting in left worse than right subarticular zone narrowing with possible irritation of the traversing S1 nerve roots, and no significant neural foraminal stenosis. IMPRESSION: 1. Inferiorly migrated right subarticular zone extrusion at L1-L2 resulting in right subarticular zone effacement with impingement of the traversing L2 nerve root. 2. 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 3. Multifactorial moderate spinal canal stenosis with left worse than right subarticular zone  narrowing at L3-L4. 4. Grade 1 anterolisthesis and advanced facet arthropathy at L4-L5 resulting in severe spinal canal stenosis and moderate left neural foraminal stenosis. 5. Moderate right worse than left facet arthropathy at L5-S1 with left worse than right subarticular zone narrowing and possible irritation of the traversing S1 nerve roots. Electronically Signed   By: Lesia Hausen M.D.   On: 09/14/2022 14:56    Assessment & Plan:  .  Essential hypertension Assessment & Plan: Well controlled on current regimen of losartan . Renal function stable, no changes today.  Orders: -     Comprehensive metabolic panel  Hypothyroidism due to acquired atrophy of thyroid -     TSH  Hyperlipidemia associated with type 2 diabetes mellitus -     Lipid panel -     LDL cholesterol, direct  DM type 2, controlled, with complication Assessment & Plan: With atherosclerosis and hypertension. A1c remains excellent on glipizide, actos and metformin . She has minimal microalbuminuria , tolerating losartan,  And tolerating atorvastatin  No changes today   Lab Results  Component Value Date   HGBA1C 5.7 12/06/2022   Lab Results  Component Value Date   LABMICR See below: 01/26/2022   LABMICR See below: 02/24/2021   MICROALBUR 0.9 12/06/2022   MICROALBUR 5.6 (H) 06/06/2022       Orders: -     Comprehensive metabolic panel -     Hemoglobin A1c -     Microalbumin / creatinine urine ratio  Other fatigue -     CBC with Differential/Platelet  Sciatica, unspecified laterality -     Ambulatory referral to Physical Therapy  Other orders -     Atorvastatin Calcium; Take 1 tablet (20 mg total) by mouth daily.  Dispense: 90 tablet; Refill: 3 -     Celecoxib; Take 1 capsule (200 mg total) by mouth 2 (two) times daily.  Dispense: 60 capsule; Refill: 1 -     glipiZIDE; TAKE 1/2 TABLET BY MOUTH AT BREAKFAST, AND 2 TABLETS AT DINNERTIME  Dispense: 270 tablet; Refill: 1 -     Levothyroxine Sodium; TAKE 1  TABLET BY MOUTH EVERY DAY ON EMPTY STOMACH WITH WATER AT LEAST 30-60 MINS BEFORE BREAKFAST  Dispense: 90 tablet; Refill: 3 -     Pioglitazone HCl; Take 1 tablet (15 mg total) by mouth daily with supper.  Dispense: 90 tablet; Refill: 1 -     ALPRAZolam; Take 1 tablet (0.25 mg total) by mouth 2 (two) times daily as needed for anxiety.  Dispense: 20 tablet; Refill: 0   .   Follow-up: Return in about 6 months (around 06/07/2023).   Sherlene Shams, MD

## 2022-12-09 NOTE — Assessment & Plan Note (Signed)
Well controlled on current regimen of losartan . Renal function stable, no changes today. 

## 2022-12-09 NOTE — Assessment & Plan Note (Signed)
With atherosclerosis and hypertension. A1c remains excellent on glipizide, actos and metformin . She has minimal microalbuminuria , tolerating losartan,  And tolerating atorvastatin  No changes today   Lab Results  Component Value Date   HGBA1C 5.7 12/06/2022   Lab Results  Component Value Date   LABMICR See below: 01/26/2022   LABMICR See below: 02/24/2021   MICROALBUR 0.9 12/06/2022   MICROALBUR 5.6 (H) 06/06/2022

## 2022-12-11 NOTE — Telephone Encounter (Signed)
Pt appt scheduled on 5.2.24

## 2022-12-26 IMAGING — CT CT HEAD W/O CM
4 series · 14 of 47 positions shown, 16 images · non-contrast
Comparison: 10/08/2004

CLINICAL DATA: Dizziness



[Series 2: axial st head 5.00 ax · axial · 0.31mm/px · z∈[-568,-473]mm · 6 of 27 slices shown, 8 images]
[im 4/27  brain]
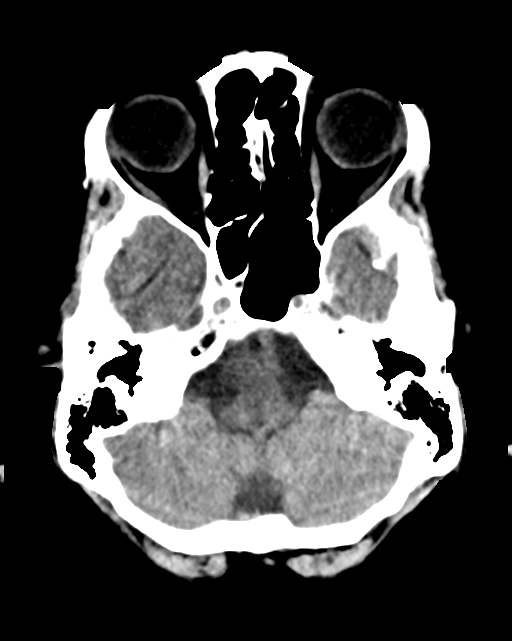
[im 4/27  bone]
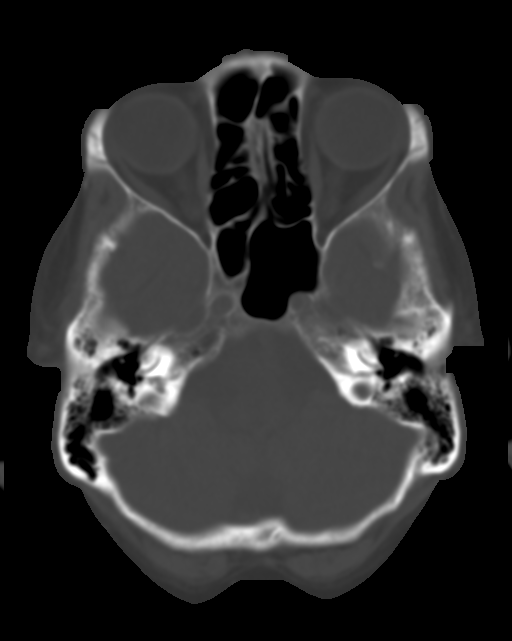
[im 8/27  brain]
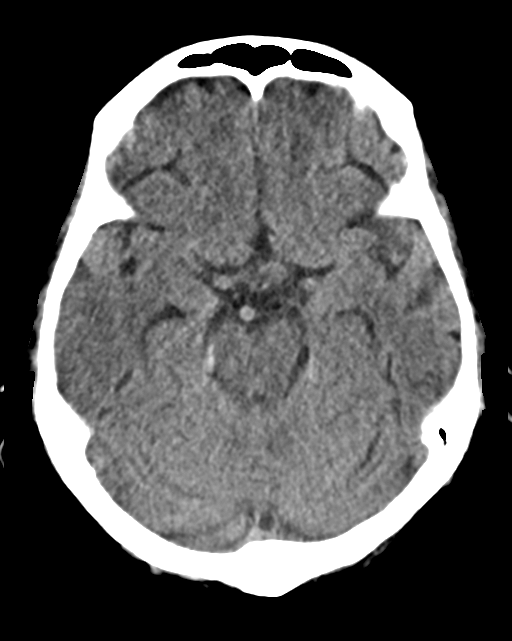
[im 12/27  brain]
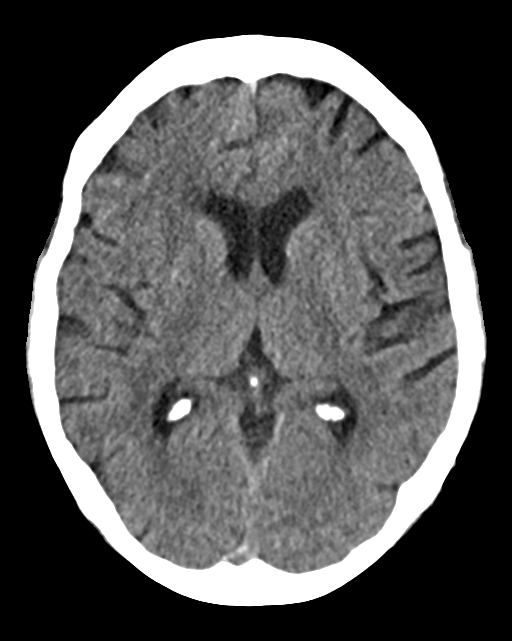
[im 15/27  brain]
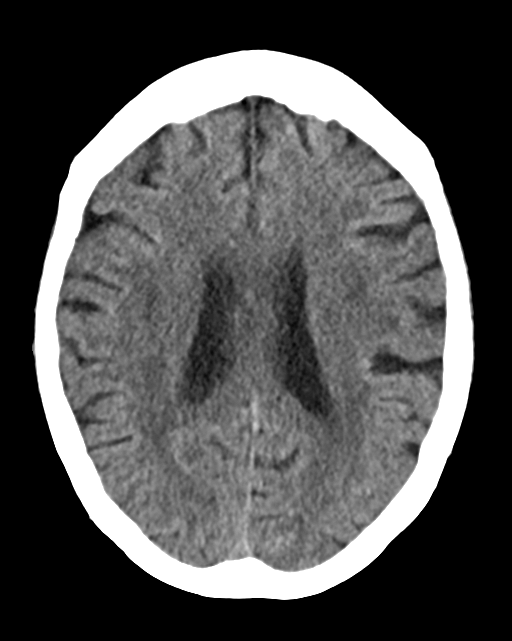
[im 19/27  brain]
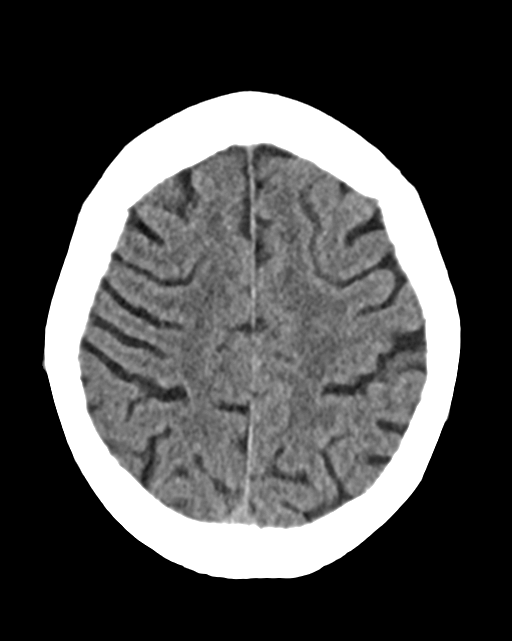
[im 19/27  bone]
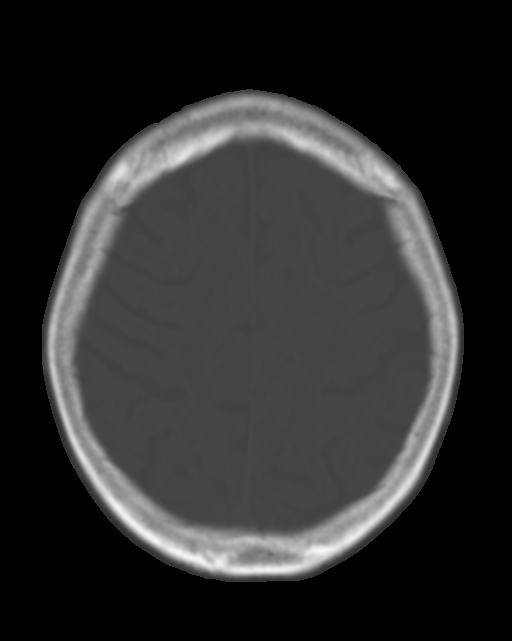
[im 23/27  brain]
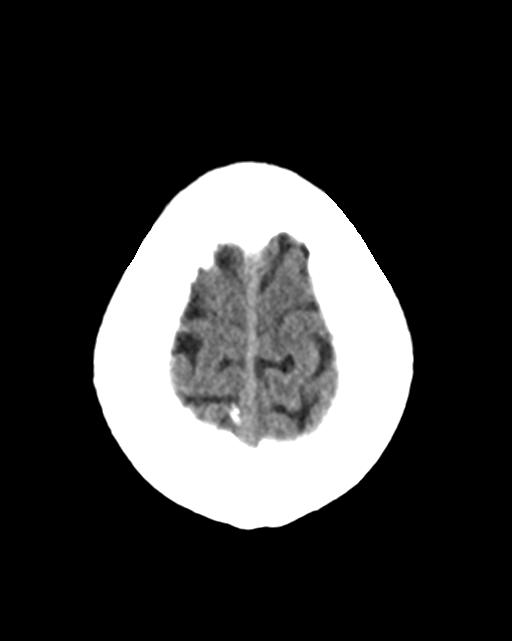

[Series 4: bone windows head 2.00 ax · axial · 0.31mm/px · z∈[-572,-560]mm · 2 of 69 slices shown]
[im 7/69  bone]
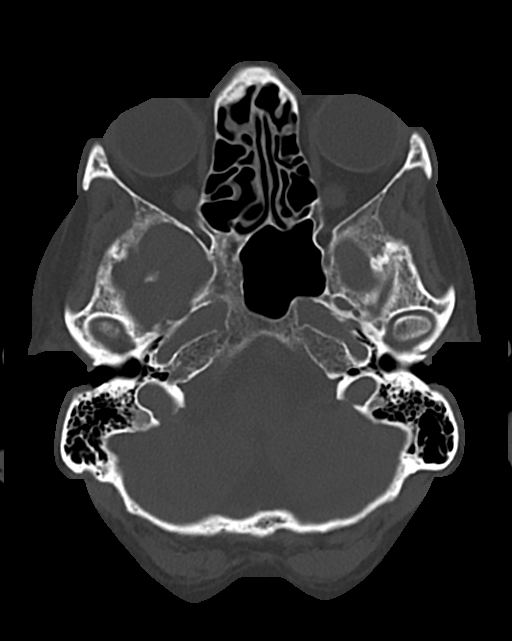
[im 13/69  bone]
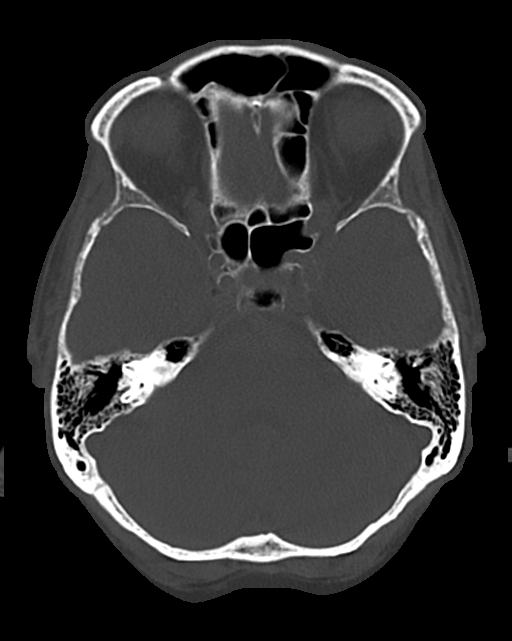

[Series 6: coronals head 3.00 cor · coronal · 0.27mm/px · 3 of 65 slices shown]
[im 22/65  brain]
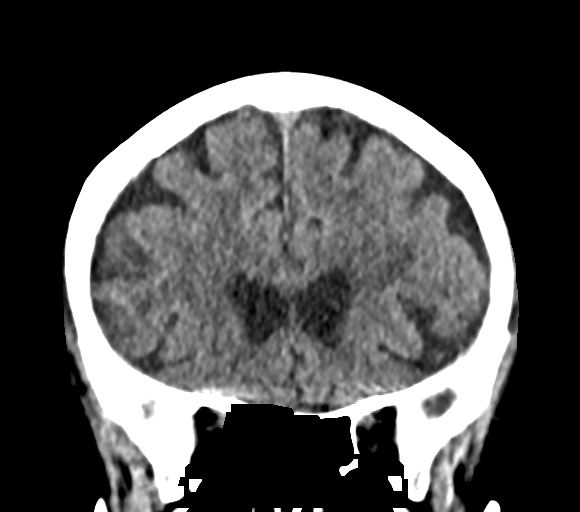
[im 29/65  brain]
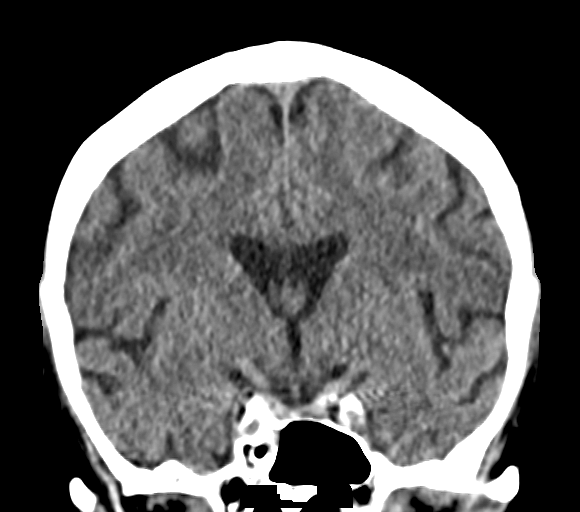
[im 36/65  brain]
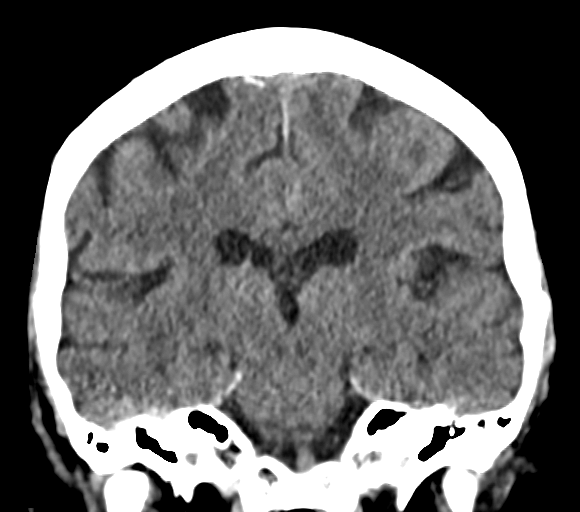

[Series 8: sagittals head 3.00 sag · sagittal · 0.27mm/px · 3 of 52 slices shown]
[im 18/52  brain]
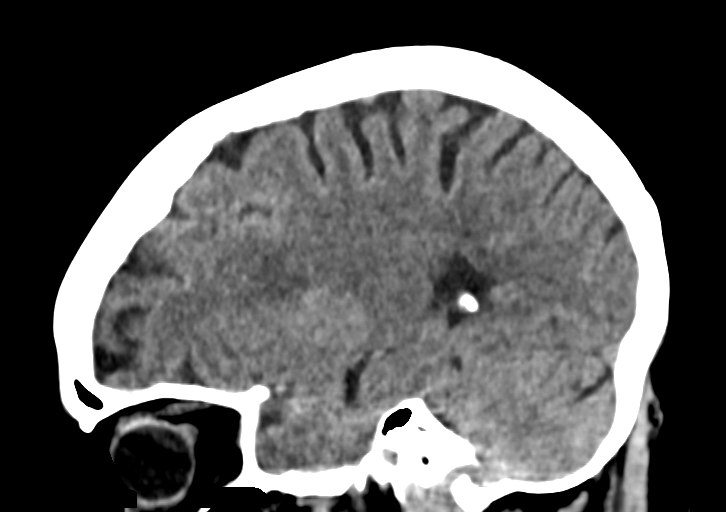
[im 26/52  brain]
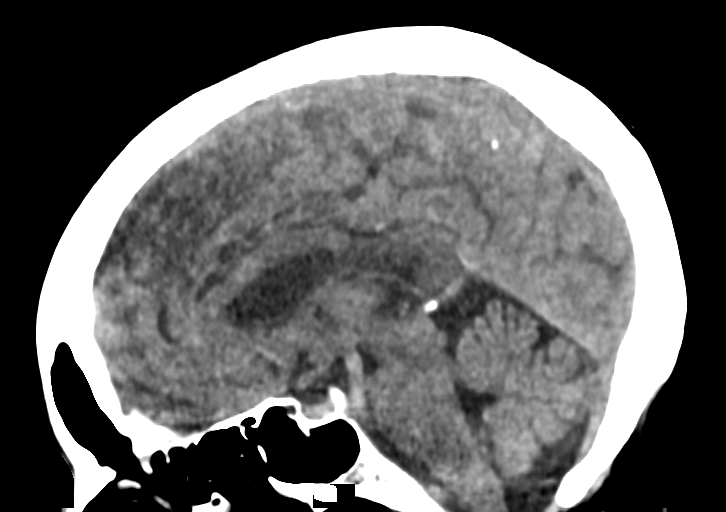
[im 35/52  brain]
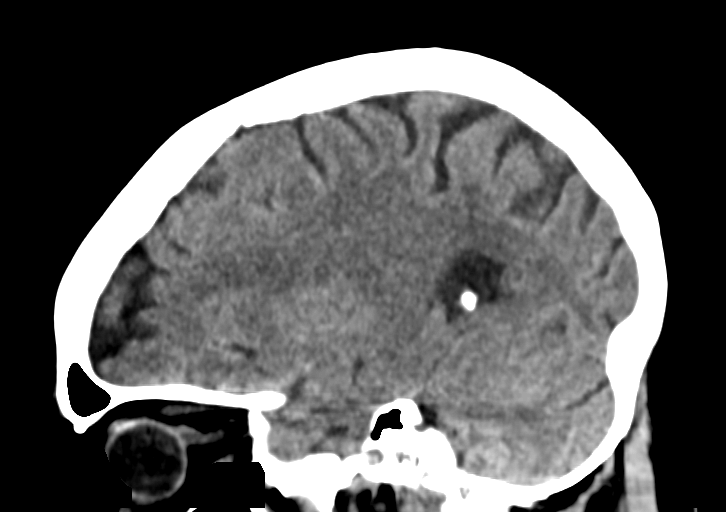

[14 of 47 positions shown; findings below may reference images not displayed]

FINDINGS: Brain: No evidence of acute infarction, hemorrhage, hydrocephalus,
extra-axial collection or mass lesion/mass effect. Scattered
low-density changes within the periventricular and subcortical white
matter compatible with chronic microvascular ischemic change.

Vascular: Atherosclerotic calcifications involving the large vessels
of the skull base. No unexpected hyperdense vessel.

Skull: Normal. Negative for fracture or focal lesion.

Sinuses/Orbits: No acute finding.

Other: None.
IMPRESSION: No acute intracranial abnormality.

## 2023-01-03 ENCOUNTER — Ambulatory Visit (INDEPENDENT_AMBULATORY_CARE_PROVIDER_SITE_OTHER): Payer: Medicare HMO | Admitting: *Deleted

## 2023-01-03 DIAGNOSIS — M810 Age-related osteoporosis without current pathological fracture: Secondary | ICD-10-CM | POA: Diagnosis not present

## 2023-01-03 MED ORDER — DENOSUMAB 60 MG/ML ~~LOC~~ SOSY
60.0000 mg | PREFILLED_SYRINGE | Freq: Once | SUBCUTANEOUS | Status: AC
  Administered 2023-01-03: 60 mg via SUBCUTANEOUS

## 2023-01-03 NOTE — Progress Notes (Signed)
Patient received Prolia Injection in right arm (subcu). Pt tolerated it well with no complaints or concerns.

## 2023-01-28 NOTE — Progress Notes (Signed)
 01/29/23 11:34 AM   [name] [birth year] [id number]  Referring provider:  Marylynn Verneita CROME, MD [address] [place],  [place] [address]  Urological history  1. High risk hematuria  -former smoker  -cysto w/ retrogrades, 12/2019 - 7 mm right renal pelvic stone.  Normal bilateral retrograde pyelograms.  Bladder unremarkable   2. Nephrolithiasis  -right URS, 12/2019  -CT, 2021 - 2 mm nonobstructing right upper pole renal calculus (coronal image  90). Two nonobstructing right lower pole renal calculi measuring up  to 3 mm (series 2/images 94-95). Additional 2 mm nonobstructing left  lower pole renal calculus (coronal image 68). No hydronephrosis.   3. Renal cyst  -CT, 2021 - Dominant 6.2 x 5.4 cm left upper pole renal cyst (series 15/image  82), benign (Bosniak I). Additional small bilateral renal cysts  measuring up to 12 mm in the lateral left lower pole (series  15/image 91), benign (Bosniak I). No enhancing renal lesions.     Chief Complaint  Patient presents with   Follow-up   Nephrolithiasis       HPI: [name] is a 82 y.o.female who presents today for follow-up on kidney stones.  She is having 1-7 daytime urinations, 1-2 episodes of nocturia with a mild urge to urinate.  She does not have urinary leakage.  She does not limit fluid intake and she does not engage in toilet mapping.    Patient denies any modifying or aggravating factors.  Patient denies any gross hematuria, dysuria or suprapubic/flank pain.  Patient denies any fevers, chills, nausea or vomiting.    UA yellow slightly cloudy, specific gravity 1.025, pH 5.5, trace leukocytes, 11-30 WBCs, 0-2 RBCs, 0-10 epithelial cells mucus is present and moderate bacteria.  KUB stable tiny right renal calculus   PMH: Past Medical History:  Diagnosis Date   Anxiety    Diabetes mellitus    GERD (gastroesophageal reflux disease)    History of kidney stones    Hyperlipidemia    Hypertension     Hypothyroidism    Osteoporosis    Positive colorectal cancer screening using Cologuard test 2019   Thyroid  disease     Surgical History: Past Surgical History:  Procedure Laterality Date   BREAST CYST ASPIRATION Bilateral 25+ yrs ago   CATARACT EXTRACTION W/PHACO Right 03/02/2015   Procedure: CATARACT EXTRACTION PHACO AND INTRAOCULAR LENS PLACEMENT (IOC) TORIC LENS;  Surgeon: Dene Etienne, MD;  Location: [facility];  Service: Ophthalmology;  Laterality: Right;  TORIC   CATARACT EXTRACTION W/PHACO Left 03/30/2015   Procedure: CATARACT EXTRACTION PHACO AND INTRAOCULAR LENS PLACEMENT (IOC);  Surgeon: Dene Etienne, MD;  Location: [facility];  Service: Ophthalmology;  Laterality: Left;  TORIC  DIABETIC - oral meds   CERVICAL POLYPECTOMY     COLONOSCOPY  01/2007   Dr Obie   COLONOSCOPY WITH PROPOFOL  N/A 10/23/2017   Procedure: COLONOSCOPY WITH PROPOFOL ;  Surgeon: Dessa Reyes ORN, MD;  Location: [facility];  Service: Endoscopy;  Laterality: N/A;   CYSTOSCOPY W/ RETROGRADES Bilateral 12/07/2019   Procedure: CYSTOSCOPY WITH RETROGRADE PYELOGRAM;  Surgeon: Penne Knee, MD;  Location: [facility];  Service: Urology;  Laterality: Bilateral;   CYSTOSCOPY/URETEROSCOPY/HOLMIUM LASER/STENT PLACEMENT Right 12/07/2019   Procedure: CYSTOSCOPY/URETEROSCOPY/HOLMIUM LASER/STENT PLACEMENT;  Surgeon: Penne Knee, MD;  Location: [facility];  Service: Urology;  Laterality: Right;   EYE SURGERY     HUMERUS FRACTURE SURGERY     KNEE ARTHROPLASTY Right 10/18/2021   Procedure: COMPUTER ASSISTED TOTAL KNEE ARTHROPLASTY;  Surgeon: Mardee Lynwood SQUIBB, MD;  Location: [facility];  Service: Orthopedics;  Laterality: Right;   TUBAL LIGATION  1982   VAGINAL DELIVERY     WRIST FRACTURE SURGERY      Home Medications:  Allergies as of 01/29/2023       Reactions   Latex Rash   IgE < 0.10 (WNL) on 10/05/2021   Nickel Rash        Medication List        Accurate as of Jan 29, 2023 11:34 AM. If you have any questions, ask your nurse or doctor.          ALPRAZolam  0.25 MG tablet Commonly known as: XANAX  Take 1 tablet (0.25 mg total) by mouth 2 (two) times daily as needed for anxiety.   aspirin  EC 81 MG tablet Take 1 tablet (81 mg total) by mouth daily. Swallow whole.   atorvastatin  20 MG tablet Commonly known as: LIPITOR Take 1 tablet (20 mg total) by mouth daily.   celecoxib  200 MG capsule Commonly known as: CELEBREX  Take 1 capsule (200 mg total) by mouth 2 (two) times daily.   cholecalciferol  25 MCG (1000 UNIT) tablet Commonly known as: VITAMIN D3 Take 2 tablets (2,000 Units total) by mouth daily.   cyanocobalamin  1000 MCG/ML injection Commonly known as: VITAMIN B12 Inject 1 mL (1,000 mcg total) into the muscle every 30 (thirty) days.   glipiZIDE  5 MG tablet Commonly known as: GLUCOTROL  TAKE 1/2 TABLET BY MOUTH AT BREAKFAST, AND 2 TABLETS AT DINNERTIME   levothyroxine  88 MCG tablet Commonly known as: SYNTHROID  TAKE 1 TABLET BY MOUTH EVERY DAY ON EMPTY STOMACH WITH WATER AT LEAST 30-60 MINS BEFORE BREAKFAST   losartan  50 MG tablet Commonly known as: COZAAR  TAKE 1 TABLET BY MOUTH EVERY DAY   metFORMIN  500 MG 24 hr tablet Commonly known as: GLUCOPHAGE -XR TAKE 1 TABLET BY MOUTH EVERY DAY WITH BREAKFAST   ONE TOUCH ULTRA 2 w/Device Kit Use to check blood sugars once daily.   OneTouch Ultra test strip Generic drug: glucose blood USE AS DIRECTED   pioglitazone  15 MG tablet Commonly known as: ACTOS  Take 1 tablet (15 mg total) by mouth daily with supper.   SYRINGE 3CC/25GX1 25G X 1 3 ML Misc Use for b12 injections        Allergies:  Allergies  Allergen Reactions   Latex Rash    IgE < 0.10 (WNL) on 10/05/2021   Nickel Rash    Family History: Family History  Problem Relation Age of Onset   Hypertension Mother    Stroke Mother 41       hemorrhagic   Aneurysm Mother    Heart disease Father    Cancer Father        Lung CA,   died of AMI while in [place] getting Laetril   Aneurysm Sister    Depression Sister    Diabetes Brother    Hypertension Brother    Breast cancer Maternal Grandmother 12   Heart disease Maternal Grandfather    Cancer Maternal Grandfather    Hypertension Son     Social History:  reports that she has never smoked. She has never used smokeless tobacco. She reports that she does not drink alcohol and does not use drugs.   Physical Exam: BP (!) 144/79   Pulse (!) 54   Ht 5' 3 (1.6 m)   Wt 150 lb (68 kg)   BMI 26.57 kg/m   Constitutional:  Well nourished. Alert and  oriented, No acute distress. HEENT: [place] AT, moist mucus membranes.  Trachea midline Cardiovascular: No clubbing, cyanosis, or edema. Respiratory: Normal respiratory effort, no increased work of breathing. Neurologic: Grossly intact, no focal deficits, moving all 4 extremities. Psychiatric: Normal mood and affect.    Laboratory Data: Lab Results  Component Value Date   CREATININE 0.74 12/06/2022    Lab Results  Component Value Date   HGBA1C 5.7 12/06/2022   I have reviewed the labs.   Pertinent Imaging: KUB tiny right lower pole stone I have independently reviewed the films.  See HPI.  Radiologist interpretation pending.   Assessment & Plan:    Nephrolithiasis  - stable tiny right lower pole stone  2. High risk hematuria - work  up 2021 - nephrolithiasis - Denies any gross hematuria - UA negative for micro heme  Return in about 1 year (around 01/29/2024) for UA, KUB and OAB questionnaire .  CLOTILDA HELON RIGGERS   [facility] [address], [address] [place], [place] [address] [phone]

## 2023-01-29 ENCOUNTER — Other Ambulatory Visit: Payer: Self-pay | Admitting: Family Medicine

## 2023-01-29 ENCOUNTER — Ambulatory Visit
Admission: RE | Admit: 2023-01-29 | Discharge: 2023-01-29 | Disposition: A | Discharge status: Home / Self Care | Payer: Medicare HMO | Attending: Physician Assistant | Admitting: Physician Assistant

## 2023-01-29 ENCOUNTER — Other Ambulatory Visit: Payer: Self-pay

## 2023-01-29 ENCOUNTER — Encounter: Payer: Self-pay | Admitting: Urology

## 2023-01-29 ENCOUNTER — Ambulatory Visit: Payer: Medicare HMO | Admitting: Urology

## 2023-01-29 ENCOUNTER — Ambulatory Visit
Admission: RE | Admit: 2023-01-29 | Discharge: 2023-01-29 | Disposition: A | Discharge status: Home / Self Care | Payer: Medicare HMO | Source: Ambulatory Visit | Attending: Urology | Admitting: Urology

## 2023-01-29 VITALS — BP 163/74 | HR 45 | Ht 63.0 in | Wt 150.0 lb

## 2023-01-29 DIAGNOSIS — Z87442 Personal history of urinary calculi: Secondary | ICD-10-CM

## 2023-01-29 DIAGNOSIS — R319 Hematuria, unspecified: Secondary | ICD-10-CM

## 2023-01-29 DIAGNOSIS — N200 Calculus of kidney: Secondary | ICD-10-CM

## 2023-01-29 DIAGNOSIS — K5900 Constipation, unspecified: Secondary | ICD-10-CM | POA: Diagnosis not present

## 2023-01-29 LAB — MICROSCOPIC EXAMINATION

## 2023-01-29 LAB — URINALYSIS, COMPLETE
Bilirubin, UA: NEGATIVE
Glucose, UA: NEGATIVE
Ketones, UA: NEGATIVE
Nitrite, UA: NEGATIVE
Protein,UA: NEGATIVE
RBC, UA: NEGATIVE
Specific Gravity, UA: 1.025 (ref 1.005–1.030)
Urobilinogen, Ur: 0.2 mg/dL (ref 0.2–1.0)
pH, UA: 5.5 (ref 5.0–7.5)

## 2023-01-29 NOTE — Progress Notes (Signed)
[name] presents for an office/procedure visit. BP today is 163/74. She is complaint with BP medication. Greater than 140/90. Provider  notified. Pt advised to follow up with PCP . Pt voiced understanding.

## 2023-01-31 IMAGING — CR DG ABDOMEN 1V
1 series · 2 of 2 positions shown · non-contrast
Comparison: 02/24/2021

CLINICAL DATA: Nephrolithiasis, follow-up

EXAM:
ABDOMEN - 1 VIEW

[Series 1: dg abd 1 view · 0.14mm/px · 2 of 2 slices shown]
[im 1/2]
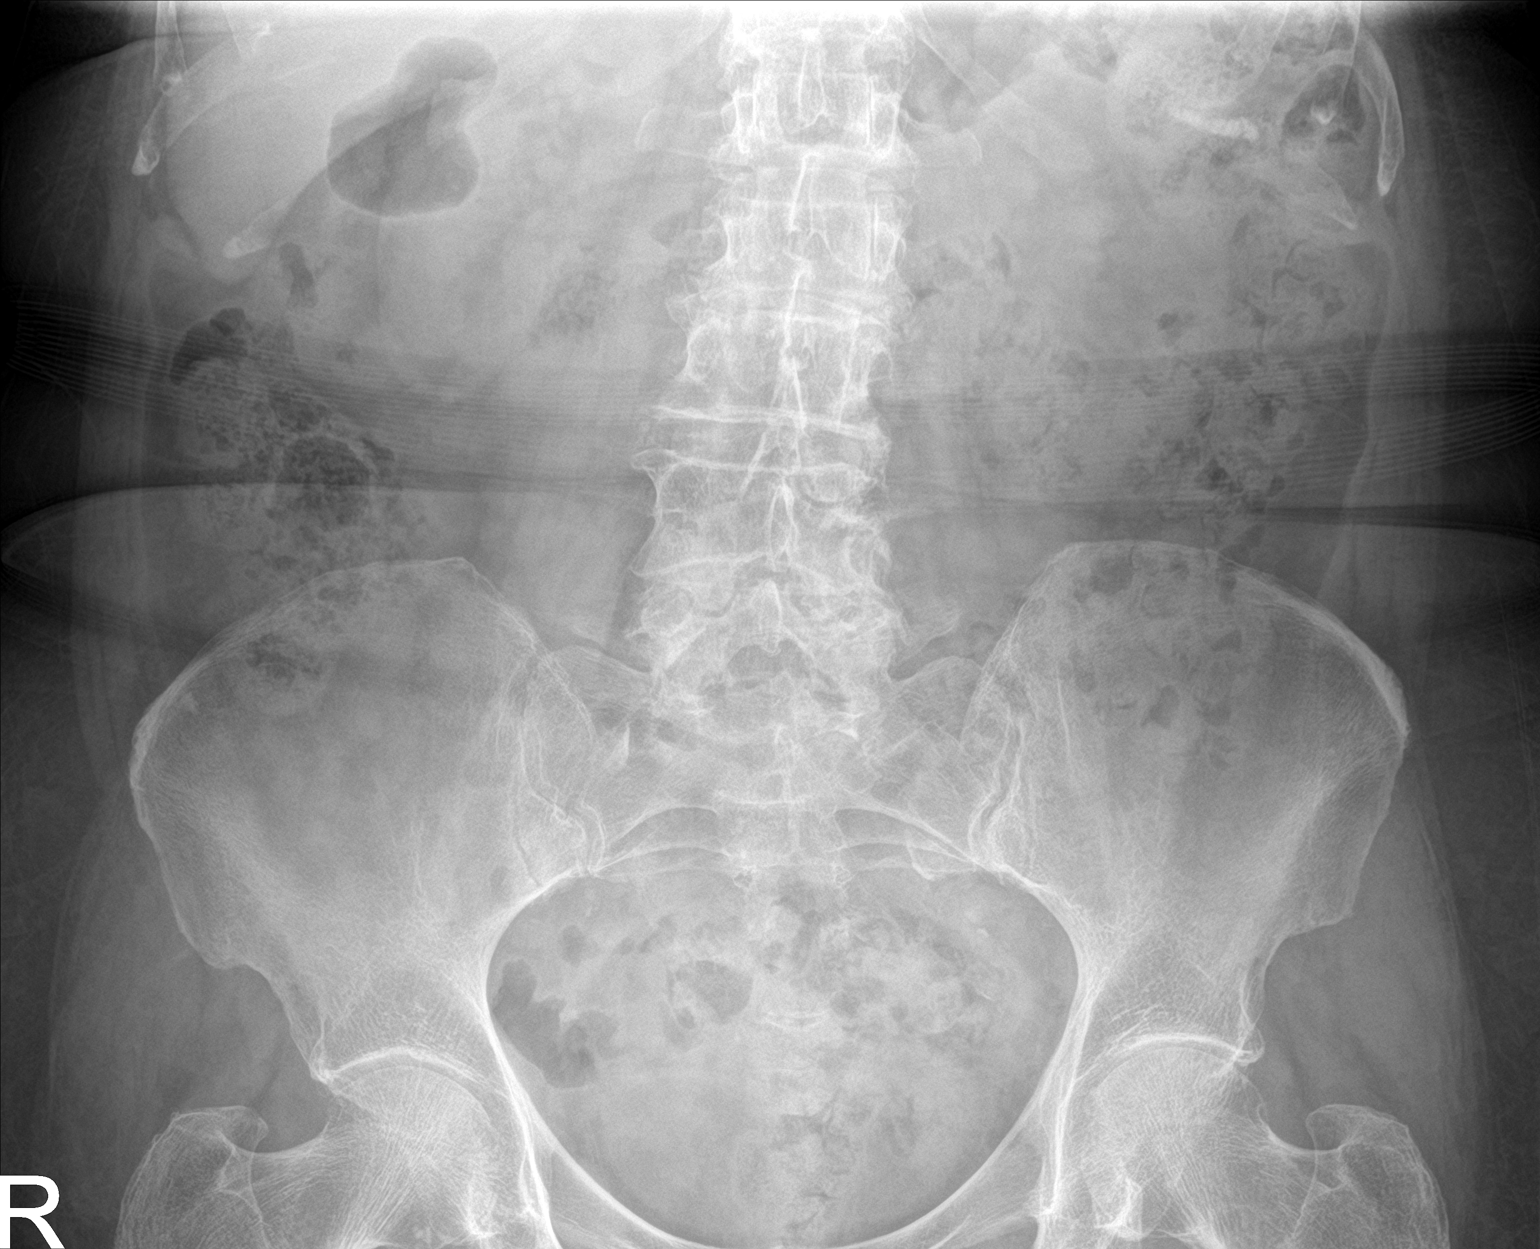
[im 2/2]
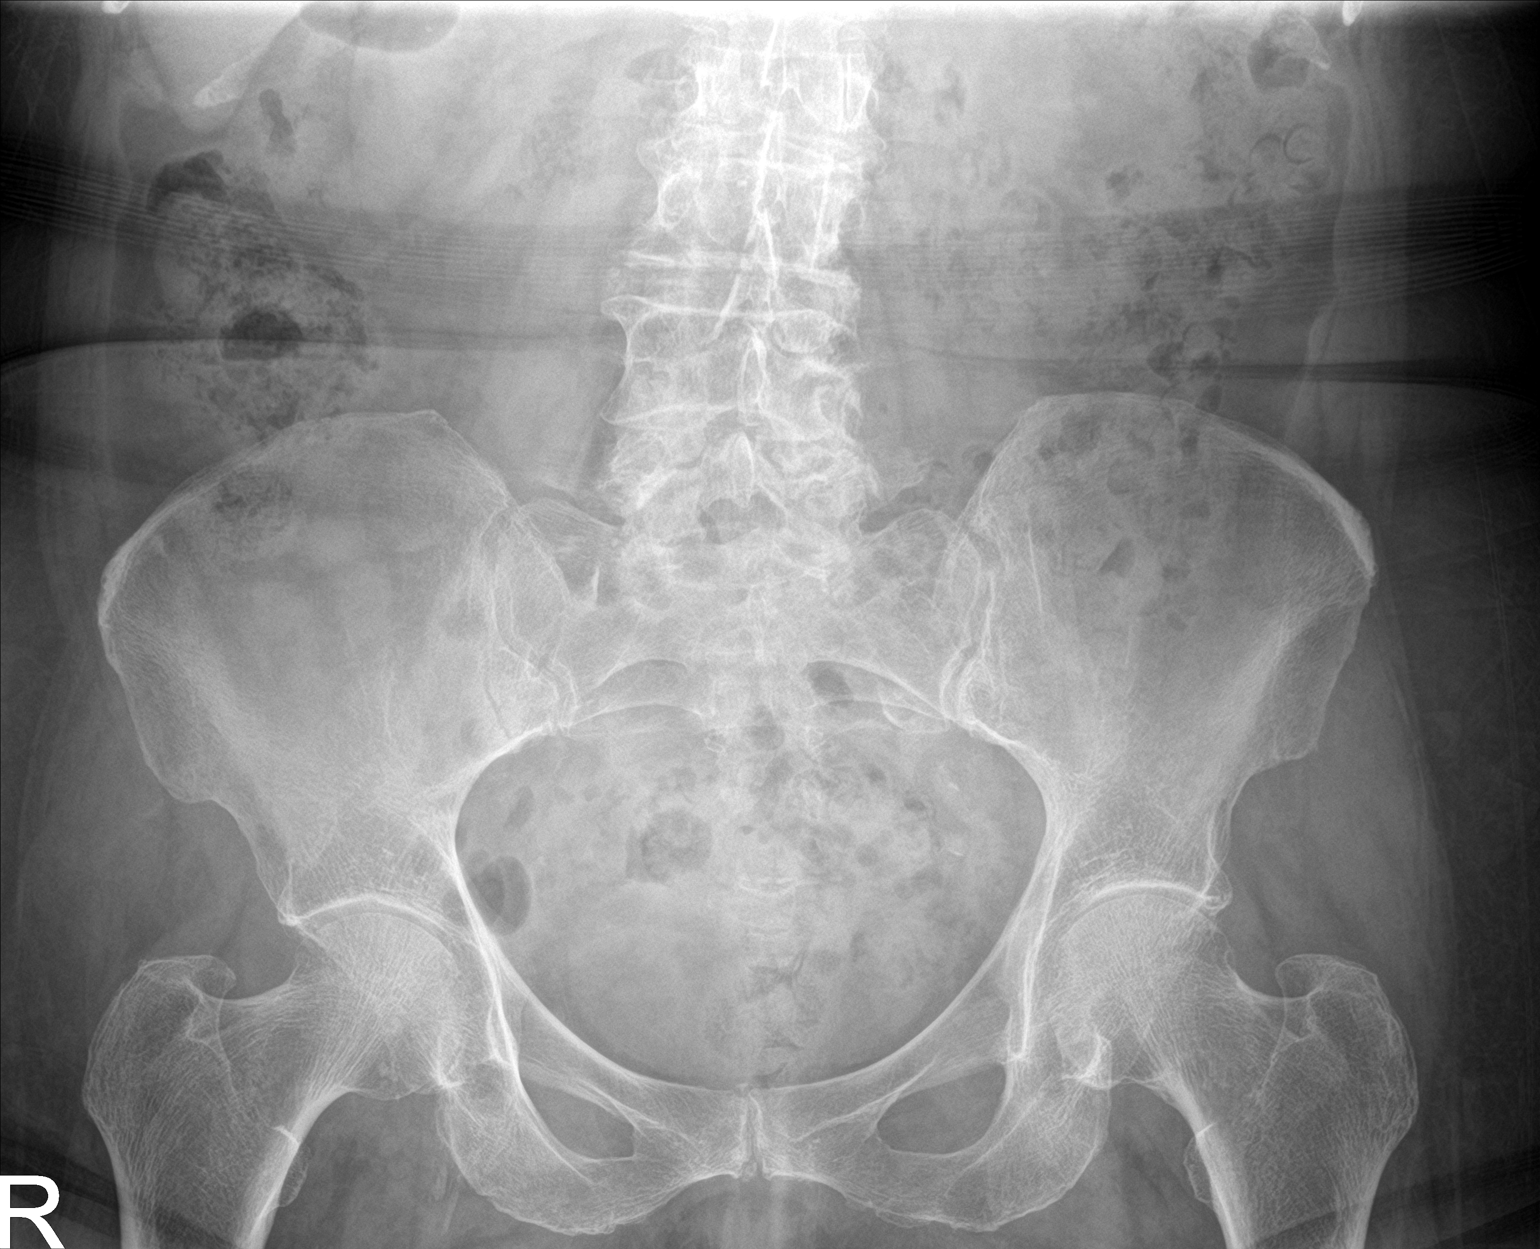

[2 of 2 positions shown; findings below may reference images not displayed]

FINDINGS: Questionable tiny calculus versus stool artifact projecting over
inferior pole LEFT kidney.

Tiny mid RIGHT renal calculus.

Nonobstructive bowel gas pattern with scattered stool throughout
colon to rectum.

Osseous demineralization with degenerative disc and facet disease
changes lumbar spine.

No additional pathologic calcifications.
IMPRESSION: Questionable tiny calculus versus stool artifact projecting over
inferior pole LEFT kidney.

Tiny mid RIGHT renal calculus.

## 2023-02-10 ENCOUNTER — Other Ambulatory Visit: Payer: Self-pay | Admitting: Internal Medicine

## 2023-03-09 ENCOUNTER — Other Ambulatory Visit: Payer: Self-pay | Admitting: Internal Medicine

## 2023-04-06 ENCOUNTER — Other Ambulatory Visit: Payer: Self-pay | Admitting: Internal Medicine

## 2023-04-15 DIAGNOSIS — H43813 Vitreous degeneration, bilateral: Secondary | ICD-10-CM | POA: Diagnosis not present

## 2023-04-15 DIAGNOSIS — E113293 Type 2 diabetes mellitus with mild nonproliferative diabetic retinopathy without macular edema, bilateral: Secondary | ICD-10-CM | POA: Diagnosis not present

## 2023-04-15 DIAGNOSIS — H02403 Unspecified ptosis of bilateral eyelids: Secondary | ICD-10-CM | POA: Diagnosis not present

## 2023-04-15 DIAGNOSIS — Z961 Presence of intraocular lens: Secondary | ICD-10-CM | POA: Diagnosis not present

## 2023-04-15 LAB — HM DIABETES EYE EXAM

## 2023-04-22 ENCOUNTER — Ambulatory Visit: Payer: Medicare HMO | Admitting: Family Medicine

## 2023-04-22 ENCOUNTER — Encounter: Payer: Self-pay | Admitting: Family Medicine

## 2023-04-22 VITALS — BP 130/76 | HR 65 | Temp 98.0°F | Ht 63.0 in | Wt 157.8 lb

## 2023-04-22 DIAGNOSIS — R21 Rash and other nonspecific skin eruption: Secondary | ICD-10-CM | POA: Diagnosis not present

## 2023-04-22 DIAGNOSIS — L309 Dermatitis, unspecified: Secondary | ICD-10-CM | POA: Diagnosis not present

## 2023-04-22 MED ORDER — PREDNISONE 10 MG PO TABS: ORAL_TABLET | ORAL | 0 refills | Status: AC

## 2023-04-22 MED ORDER — TRIAMCINOLONE ACETONIDE 0.1 % EX CREA: 1.0000 | TOPICAL_CREAM | Freq: Two times a day (BID) | CUTANEOUS | 0 refills | Status: DC

## 2023-04-22 NOTE — Assessment & Plan Note (Signed)
Discussed rash on forearms could be related to poison ivy, atopic dermatitis, or some other exposure.  Will treat with prednisone taper as outlined.  Discussed risk of sleep issues, increasing glucose, and agitation.  Discussed if her glucose gets to 300 or higher she needs to let us know.  She will try to contact her dermatologist for evaluation as well.

## 2023-04-22 NOTE — Addendum Note (Signed)
Addended by: Birdie Sons Enid Maultsby G on: 04/22/2023 11:57 AM   Modules accepted: Level of Service

## 2023-04-22 NOTE — Assessment & Plan Note (Addendum)
Chronic issue with exacerbation.  Suspect rash on hands is related to eczema.  We will see if the steroid taper is beneficial.  I will also prescribe triamcinolone 0.1% cream to try as well.  Patient will call dermatology to schedule follow-up.

## 2023-04-22 NOTE — Patient Instructions (Signed)
Nice to see you. Please call dermatology to see if they can see you for these issues. If you have any side effects that we discussed with the prednisone please let me know.

## 2023-04-22 NOTE — Progress Notes (Addendum)
Marikay Alar, MD Phone: [phone]  [name] is a 82 y.o. female who presents today for rash.  Patient chronic issues with eczema or seasonal psoriasis that dermatology diagnosed previously.  This is bothering her left hand recently.  There is itching and peeling.  She has tried numerous over-the-counter topical medications with little benefit.  Patient notes onset of a second rash on her bilateral forearms spreading up towards her neck.  This started last Thursday after she mowed the yard.  She notes no changes in soaps or detergents.  No contacts with a similar rash.  No travel.  No known exposure to any allergens.  Topical treatments have not been beneficial for this either.  Social History   Tobacco Use  Smoking Status Never  Smokeless Tobacco Never    Current Outpatient Medications on File Prior to Visit  Medication Sig Dispense Refill   ALPRAZolam (XANAX) 0.25 MG tablet Take 1 tablet (0.25 mg total) by mouth 2 (two) times daily as needed for anxiety. 20 tablet 0   aspirin EC 81 MG tablet Take 1 tablet (81 mg total) by mouth daily. Swallow whole.     atorvastatin (LIPITOR) 20 MG tablet Take 1 tablet (20 mg total) by mouth daily. 90 tablet 3   Blood Glucose Monitoring Suppl (ONE TOUCH ULTRA 2) w/Device KIT Use to check blood sugars once daily. 1 kit 0   celecoxib (CELEBREX) 200 MG capsule TAKE 1 CAPSULE BY MOUTH TWICE A DAY 60 capsule 1   cholecalciferol (VITAMIN D) 25 MCG (1000 UNIT) tablet Take 2 tablets (2,000 Units total) by mouth daily. 90 tablet 1   cyanocobalamin (VITAMIN B12) 1000 MCG/ML injection INJECT 1 ML (1,000 MCG TOTAL) INTO THE MUSCLE EVERY 30 DAYS. 3 mL 3   glipiZIDE (GLUCOTROL) 5 MG tablet TAKE 1/2 TABLET BY MOUTH AT BREAKFAST, AND 2 TABLETS AT DINNERTIME 225 tablet 2   levothyroxine (SYNTHROID) 88 MCG tablet TAKE 1 TABLET BY MOUTH EVERY DAY ON EMPTY STOMACH WITH WATER AT LEAST 30-60 MINS BEFORE BREAKFAST 90 tablet 3   losartan (COZAAR) 50 MG tablet TAKE  1 TABLET BY MOUTH EVERY DAY 90 tablet 3   metFORMIN (GLUCOPHAGE-XR) 500 MG 24 hr tablet TAKE 1 TABLET BY MOUTH EVERY DAY WITH BREAKFAST 90 tablet 3   ONETOUCH ULTRA test strip USE AS DIRECTED 100 strip 10   pioglitazone (ACTOS) 15 MG tablet Take 1 tablet (15 mg total) by mouth daily with supper. 90 tablet 1   Syringe/Needle, Disp, (SYRINGE 3CC/25GX1") 25G X 1" 3 ML MISC Use for b12 injections 50 each 0   [DISCONTINUED] lisinopril (PRINIVIL,ZESTRIL) 10 MG tablet Take 1 tablet (10 mg total) by mouth daily. 90 tablet 3   [DISCONTINUED] ranitidine (ZANTAC) 150 MG capsule      No current facility-administered medications on file prior to visit.     ROS see history of present illness  Objective  Physical Exam Vitals:   04/22/23 1047  BP: 130/76  Pulse: 65  Temp: 98 F (36.7 C)  SpO2: 99%    BP Readings from Last 3 Encounters:  04/22/23 130/76  01/29/23 (!) 163/74  12/06/22 130/72   Wt Readings from Last 3 Encounters:  04/22/23 157 lb 12.8 oz (71.6 kg)  01/29/23 150 lb (68 kg)  12/06/22 150 lb 1.6 oz (68.1 kg)    Physical Exam Skin:    Comments: Erythematous papules on bilateral arms and around her collarbone bilaterally, left hand with peeling and flaking skin and evidence of excoriation  Assessment/Plan: Please see individual problem list.  Rash Assessment & Plan: Discussed rash on forearms could be related to poison ivy, atopic dermatitis, or some other exposure.  Will treat with prednisone taper as outlined.  Discussed risk of sleep issues, increasing glucose, and agitation.  Discussed if her glucose gets to 300 or higher she needs to let us know.  She will try to contact her dermatologist for evaluation as well.  Orders: -     predniSONE; Take 4 tablets (40 mg total) by mouth daily with breakfast for 3 days, THEN 3 tablets (30 mg total) daily with breakfast for 3 days, THEN 2 tablets (20 mg total) daily with breakfast for 3 days, THEN 1 tablet (10 mg total) daily  with breakfast for 3 days.  Dispense: 30 tablet; Refill: 0  Eczema of hand Assessment & Plan: Chronic issue with exacerbation.  Suspect rash on hands is related to eczema.  We will see if the steroid taper is beneficial.  I will also prescribe triamcinolone 0.1% cream to try as well.  Patient will call dermatology to schedule follow-up.  Orders: -     Triamcinolone Acetonide; Apply 1 Application topically 2 (two) times daily. For up to 14 days.  Dispense: 80 g; Refill: 0   Return if symptoms worsen or fail to improve.   Marikay Alar, MD [facility]

## 2023-05-13 DIAGNOSIS — D2271 Melanocytic nevi of right lower limb, including hip: Secondary | ICD-10-CM | POA: Diagnosis not present

## 2023-05-13 DIAGNOSIS — D485 Neoplasm of uncertain behavior of skin: Secondary | ICD-10-CM | POA: Diagnosis not present

## 2023-05-13 DIAGNOSIS — D2262 Melanocytic nevi of left upper limb, including shoulder: Secondary | ICD-10-CM | POA: Diagnosis not present

## 2023-05-13 DIAGNOSIS — L988 Other specified disorders of the skin and subcutaneous tissue: Secondary | ICD-10-CM | POA: Diagnosis not present

## 2023-05-13 DIAGNOSIS — D2272 Melanocytic nevi of left lower limb, including hip: Secondary | ICD-10-CM | POA: Diagnosis not present

## 2023-05-13 DIAGNOSIS — L821 Other seborrheic keratosis: Secondary | ICD-10-CM | POA: Diagnosis not present

## 2023-05-13 DIAGNOSIS — L820 Inflamed seborrheic keratosis: Secondary | ICD-10-CM | POA: Diagnosis not present

## 2023-05-13 DIAGNOSIS — D2261 Melanocytic nevi of right upper limb, including shoulder: Secondary | ICD-10-CM | POA: Diagnosis not present

## 2023-05-13 DIAGNOSIS — D225 Melanocytic nevi of trunk: Secondary | ICD-10-CM | POA: Diagnosis not present

## 2023-05-13 DIAGNOSIS — L570 Actinic keratosis: Secondary | ICD-10-CM | POA: Diagnosis not present

## 2023-05-22 ENCOUNTER — Other Ambulatory Visit: Payer: Self-pay | Admitting: Family Medicine

## 2023-05-22 ENCOUNTER — Encounter: Payer: Self-pay | Admitting: Family Medicine

## 2023-05-22 DIAGNOSIS — L309 Dermatitis, unspecified: Secondary | ICD-10-CM

## 2023-06-06 ENCOUNTER — Telehealth: Payer: Self-pay | Admitting: *Deleted

## 2023-06-06 ENCOUNTER — Encounter: Payer: Self-pay | Admitting: *Deleted

## 2023-06-06 NOTE — Telephone Encounter (Signed)
$  301 due for Prolia; PA on file (PA# [id number] Effective dates: 11/20/22 through 11/20/23)  Due on or after 07/06/23.Sent mychart message to schedule appt

## 2023-06-10 ENCOUNTER — Encounter: Payer: Self-pay | Admitting: Internal Medicine

## 2023-06-10 ENCOUNTER — Ambulatory Visit (INDEPENDENT_AMBULATORY_CARE_PROVIDER_SITE_OTHER): Payer: Medicare HMO | Admitting: Internal Medicine

## 2023-06-10 VITALS — BP 128/62 | HR 59 | Temp 98.2°F | Ht 63.0 in | Wt 162.4 lb

## 2023-06-10 DIAGNOSIS — I10 Essential (primary) hypertension: Secondary | ICD-10-CM

## 2023-06-10 DIAGNOSIS — D696 Thrombocytopenia, unspecified: Secondary | ICD-10-CM | POA: Diagnosis not present

## 2023-06-10 DIAGNOSIS — E118 Type 2 diabetes mellitus with unspecified complications: Secondary | ICD-10-CM | POA: Diagnosis not present

## 2023-06-10 DIAGNOSIS — E1169 Type 2 diabetes mellitus with other specified complication: Secondary | ICD-10-CM

## 2023-06-10 DIAGNOSIS — E034 Atrophy of thyroid (acquired): Secondary | ICD-10-CM

## 2023-06-10 DIAGNOSIS — E875 Hyperkalemia: Secondary | ICD-10-CM

## 2023-06-10 DIAGNOSIS — L97909 Non-pressure chronic ulcer of unspecified part of unspecified lower leg with unspecified severity: Secondary | ICD-10-CM | POA: Diagnosis not present

## 2023-06-10 DIAGNOSIS — Z23 Encounter for immunization: Secondary | ICD-10-CM | POA: Diagnosis not present

## 2023-06-10 DIAGNOSIS — E785 Hyperlipidemia, unspecified: Secondary | ICD-10-CM

## 2023-06-10 DIAGNOSIS — L309 Dermatitis, unspecified: Secondary | ICD-10-CM

## 2023-06-10 DIAGNOSIS — Z7984 Long term (current) use of oral hypoglycemic drugs: Secondary | ICD-10-CM | POA: Diagnosis not present

## 2023-06-10 DIAGNOSIS — I700 Atherosclerosis of aorta: Secondary | ICD-10-CM | POA: Diagnosis not present

## 2023-06-10 DIAGNOSIS — Z1231 Encounter for screening mammogram for malignant neoplasm of breast: Secondary | ICD-10-CM

## 2023-06-10 LAB — COMPREHENSIVE METABOLIC PANEL WITH GFR
ALT: 10 U/L (ref 0–35)
AST: 14 U/L (ref 0–37)
Albumin: 4.2 g/dL (ref 3.5–5.2)
Alkaline Phosphatase: 71 U/L (ref 39–117)
BUN: 27 mg/dL — ABNORMAL HIGH (ref 6–23)
CO2: 26 meq/L (ref 19–32)
Calcium: 9.9 mg/dL (ref 8.4–10.5)
Chloride: 106 meq/L (ref 96–112)
Creatinine, Ser: 0.77 mg/dL (ref 0.40–1.20)
GFR: 72.2 mL/min
Glucose, Bld: 56 mg/dL — ABNORMAL LOW (ref 70–99)
Potassium: 5.3 meq/L — ABNORMAL HIGH (ref 3.5–5.1)
Sodium: 139 meq/L (ref 135–145)
Total Bilirubin: 0.5 mg/dL (ref 0.2–1.2)
Total Protein: 6.6 g/dL (ref 6.0–8.3)

## 2023-06-10 LAB — LIPID PANEL
Cholesterol: 136 mg/dL (ref 0–200)
HDL: 66.6 mg/dL
LDL Cholesterol: 49 mg/dL (ref 0–99)
NonHDL: 69.26
Total CHOL/HDL Ratio: 2
Triglycerides: 100 mg/dL (ref 0.0–149.0)
VLDL: 20 mg/dL (ref 0.0–40.0)

## 2023-06-10 LAB — HEMOGLOBIN A1C: Hgb A1c MFr Bld: 6.3 % (ref 4.6–6.5)

## 2023-06-10 LAB — TSH: TSH: 3.82 u[IU]/mL (ref 0.35–5.50)

## 2023-06-10 LAB — LDL CHOLESTEROL, DIRECT: Direct LDL: 59 mg/dL

## 2023-06-10 MED ORDER — MOMETASONE FUROATE 0.1 % EX OINT: TOPICAL_OINTMENT | Freq: Every day | CUTANEOUS | 0 refills | Status: DC

## 2023-06-10 MED ORDER — PIOGLITAZONE HCL 15 MG PO TABS: 15.0000 mg | ORAL_TABLET | Freq: Every day | ORAL | 1 refills | Status: DC

## 2023-06-10 NOTE — Assessment & Plan Note (Signed)
Reviewed findings of prior CT scan today..  Patient is tolerating high potency statin therapy  

## 2023-06-10 NOTE — Assessment & Plan Note (Signed)
Adding stronger steorid (limited from using high potency by insurance ocverage)

## 2023-06-10 NOTE — Assessment & Plan Note (Signed)
Mild, noted last year but not worked up due to competing medical issues.  Now resolved.   Lab Results  Component Value Date   WBC 5.2 12/06/2022   HGB 12.7 12/06/2022   HCT 37.4 12/06/2022   MCV 92.4 12/06/2022   PLT 160.0 12/06/2022

## 2023-06-10 NOTE — Progress Notes (Signed)
Subjective:  Patient ID: [name], female    DOB: 14-Sep-1940  Age: 82 y.o. MRN: [id number]  CC: The primary encounter diagnosis was Essential hypertension. 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   Checking  blood sugars once daily at variable times, more often if she feels she may be having a hypoglycemic event. .  BS have been under 130 fasting and < 150 post prandially.  Had a symptomatic CBG of 59  recently,  at 10 am . .  rare occasion. Taking   medications as directed. Following a carbohydrate modified diet 6 days per week. Denies numbness, burning and tingling of extremities. Appetite is good.    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 Dime sized.  Started after a biopsy done by  Dr Roseanne Kaufman.  Benign path report   3) Bilateral hand eczema.  Question of Latex allergy,  wears gloves to clean using triamcinolone.  Left handed,  worse than right. Improving with steroid cream   has been working outside with garden gloves ? Latex allergy?  4)   Outpatient Medications Prior to Visit  Medication Sig Dispense Refill   ALPRAZolam (XANAX) 0.25 MG tablet Take 1 tablet (0.25 mg total) by mouth 2 (two) times daily as  needed for anxiety. 20 tablet 0   aspirin EC 81 MG tablet Take 1 tablet (81 mg total) by mouth daily. Swallow whole.     atorvastatin (LIPITOR) 20 MG tablet Take 1 tablet (20 mg total) by mouth daily. 90 tablet 3   Blood Glucose Monitoring Suppl (ONE TOUCH ULTRA 2) w/Device KIT Use to check blood sugars once daily. 1 kit 0   celecoxib (CELEBREX) 200 MG capsule TAKE 1 CAPSULE BY MOUTH TWICE A DAY 60 capsule 1   cholecalciferol (VITAMIN D) 25 MCG (1000 UNIT) tablet Take 2 tablets (2,000 Units total) by mouth daily. 90 tablet 1   cyanocobalamin (VITAMIN B12) 1000 MCG/ML injection INJECT 1 ML (1,000 MCG TOTAL) INTO THE MUSCLE EVERY 30 DAYS. 3 mL 3   glipiZIDE (GLUCOTROL) 5 MG tablet TAKE 1/2 TABLET BY MOUTH AT BREAKFAST, AND 2 TABLETS AT DINNERTIME 225 tablet 2   levothyroxine (SYNTHROID) 88 MCG tablet TAKE 1 TABLET BY MOUTH EVERY DAY ON EMPTY STOMACH WITH WATER AT LEAST 30-60 MINS BEFORE BREAKFAST 90 tablet 3   losartan (COZAAR) 50 MG tablet TAKE 1 TABLET BY MOUTH EVERY DAY 90 tablet 3   metFORMIN (GLUCOPHAGE-XR) 500 MG 24 hr tablet TAKE 1 TABLET BY MOUTH EVERY DAY WITH BREAKFAST 90 tablet 3   ONETOUCH ULTRA test strip USE AS DIRECTED 100 strip 10   Syringe/Needle, Disp, (SYRINGE 3CC/25GX1") 25G X 1" 3 ML MISC Use for b12  injections 50 each 0   triamcinolone cream (KENALOG) 0.1 % APPLY 1 APPLICATION TOPICALLY 2 (TWO) TIMES DAILY. FOR UP TO 14 DAYS. 60 g 0   pioglitazone (ACTOS) 15 MG tablet Take 1 tablet (15 mg total) by mouth daily with supper. 90 tablet 1   No facility-administered medications prior to visit.    Review of Systems;  Patient denies headache, fevers, malaise, unintentional weight loss, skin rash, eye pain, sinus congestion and sinus pain, sore throat, dysphagia,  hemoptysis , cough, dyspnea, wheezing, chest pain, palpitations, orthopnea, edema, abdominal pain, nausea, melena, diarrhea, constipation, flank pain, dysuria, hematuria, urinary  Frequency, nocturia, numbness, tingling,  seizures,  Focal weakness, Loss of consciousness,  Tremor, insomnia, depression, anxiety, and suicidal ideation.      Objective:  BP 128/62   Pulse (!) 59   Temp 98.2 F (36.8 C) (Oral)   Ht 5\' 3"  (1.6 m)   Wt 162 lb 6.4 oz (73.7 kg)   SpO2 95%   BMI 28.77 kg/m   BP Readings from Last 3 Encounters:  06/10/23 128/62  04/22/23 130/76  01/29/23 (!) 163/74    Wt Readings from Last 3 Encounters:  06/10/23 162 lb 6.4 oz (73.7 kg)  04/22/23 157 lb 12.8 oz (71.6 kg)  01/29/23 150 lb (68 kg)    Physical Exam Vitals reviewed.  Constitutional:      General: She is not in acute distress.    Appearance: Normal appearance. She is normal weight. She is not ill-appearing, toxic-appearing or diaphoretic.  HENT:     Head: Normocephalic.  Eyes:     General: No scleral icterus.       Right eye: No discharge.        Left eye: No discharge.     Conjunctiva/sclera: Conjunctivae normal.  Cardiovascular:     Rate and Rhythm: Normal rate and regular rhythm.     Heart sounds: Normal heart sounds.  Pulmonary:     Effort: Pulmonary effort is normal. No respiratory distress.     Breath sounds: Normal breath sounds.  Musculoskeletal:        General: Normal range of motion.  Skin:    General: Skin is warm and dry.     Findings: Rash present. Rash is scaling.          Comments: Involving the left hand, cracks in skin /callouses   Neurological:     General: No focal deficit present.     Mental Status: She is alert and oriented to person, place, and time. Mental status is at baseline.  Psychiatric:        Mood and Affect: Mood normal.        Behavior: Behavior normal.        Thought Content: Thought content normal.        Judgment: Judgment normal.    Lab Results  Component Value Date   HGBA1C 6.3 06/10/2023   HGBA1C 5.7 12/06/2022   HGBA1C 6.1 06/06/2022    Lab Results  Component Value Date   CREATININE 0.77 06/10/2023   CREATININE 0.74 12/06/2022   CREATININE 0.84 06/06/2022     Lab Results  Component Value Date   WBC 5.2 12/06/2022   HGB 12.7 12/06/2022   HCT 37.4 12/06/2022   PLT 160.0 12/06/2022   GLUCOSE 56 (L) 06/10/2023   CHOL 136 06/10/2023   TRIG 100.0 06/10/2023   HDL 66.60 06/10/2023   LDLDIRECT 59.0 06/10/2023   LDLCALC 49 06/10/2023   ALT 10 06/10/2023  AST 14 06/10/2023   NA 139 06/10/2023   K 5.3 No hemolysis seen (H) 06/10/2023   CL 106 06/10/2023   CREATININE 0.77 06/10/2023   BUN 27 (H) 06/10/2023   CO2 26 06/10/2023   TSH 3.82 06/10/2023   HGBA1C 6.3 06/10/2023   MICROALBUR 0.9 12/06/2022    Abdomen 1 view (KUB)  Result Date: 02/01/2023 CLINICAL DATA:  Kidney stone. EXAM: ABDOMEN - 1 VIEW COMPARISON:  01/26/2022 FINDINGS: The bowel gas pattern is normal. Increased stool noted consistent with constipation. Punctate 3 mm calcification overlies the right kidney region which might be a stone. This can be confirmed with noncontrast CT. IMPRESSION: 3 mm right-sided calcification consistent with a renal stone. Constipation. Electronically Signed   By: Layla Maw M.D.   On: 02/01/2023 21:27    Assessment & Plan:  .Essential hypertension -     Comprehensive metabolic panel  Hypothyroidism due to acquired atrophy of thyroid -     TSH  Hyperlipidemia associated with type 2 diabetes mellitus (HCC) -     Lipid panel -     LDL cholesterol, direct  DM type 2, controlled, with complication [facility]) Assessment & Plan: With atherosclerosis and hypertension. A1c remains excellent on glipizide, actos and metformin . 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  Patient is tolerating high potency statin therapy     Need for influenza vaccination -     Flu Vaccine Trivalent High Dose (Fluad)  Thrombocytopenia (HCC) Assessment & Plan: Mild, noted last year but not worked up due to competing medical issues.  Now resolved.   Lab Results  Component Value Date   WBC 5.2 12/06/2022   HGB 12.7 12/06/2022   HCT 37.4 12/06/2022   MCV 92.4 12/06/2022   PLT 160.0 12/06/2022      Eczema of hand Assessment & Plan: Adding stronger steorid (limited from using high potency by insurance ocverage)    Other orders -     Pioglitazone HCl; Take 1 tablet (15 mg total) by mouth daily with supper.  Dispense: 90 tablet; Refill: 1 -     Mometasone Furoate; Apply topically daily.  Dispense: 45 g; Refill: 0     I provided 30 minutes of face-to-face time during this encounter reviewing patient's last visit with me, patient's  most recent visit with cardiology,  nephrology,  and neurology,  recent surgical and non surgical procedures, previous  labs and imaging studies, counseling on , amanagemdn of eczema nd post visit ordering to diagnostics and therapeutics .   Follow-up: Return in about 6 months (around 12/09/2023) for follow up diabetes.   Sherlene Shams, MD

## 2023-06-10 NOTE — Assessment & Plan Note (Signed)
With atherosclerosis and hypertension. A1c remains excellent on glipizide, actos and metformin . She has minimal microalbuminuria , tolerating losartan,  And tolerating atorvastatin  No changes today   Lab Results  Component Value Date   HGBA1C 6.3 06/10/2023   Lab Results  Component Value Date   LABMICR See below: 01/29/2023   LABMICR See below: 01/26/2022   MICROALBUR 0.9 12/06/2022   MICROALBUR 5.6 (H) 06/06/2022

## 2023-06-10 NOTE — Patient Instructions (Addendum)
YOUR MAMMOGRAM IS DUE in December, PLEASE CALL AND GET THIS SCHEDULED! [facility] - call [phone]    Your leg wound should have healed by now.  Please keep it covered .  I have made a referral to [facility]   Your hand eczema may respond faster to a stronger steroid ointment called mometasone, which I have sent to your pharmacy  Use twice daily and reapply moisturizedseveral times daily

## 2023-06-11 NOTE — Addendum Note (Signed)
Addended by: Sherlene Shams on: 06/11/2023 08:30 AM   Modules accepted: Orders

## 2023-06-12 ENCOUNTER — Other Ambulatory Visit (INDEPENDENT_AMBULATORY_CARE_PROVIDER_SITE_OTHER): Payer: Medicare HMO

## 2023-06-12 DIAGNOSIS — E875 Hyperkalemia: Secondary | ICD-10-CM

## 2023-06-12 LAB — BASIC METABOLIC PANEL WITH GFR
BUN: 25 mg/dL — ABNORMAL HIGH (ref 6–23)
CO2: 27 meq/L (ref 19–32)
Calcium: 9.5 mg/dL (ref 8.4–10.5)
Chloride: 105 meq/L (ref 96–112)
Creatinine, Ser: 0.88 mg/dL (ref 0.40–1.20)
GFR: 61.51 mL/min
Glucose, Bld: 84 mg/dL (ref 70–99)
Potassium: 4.5 meq/L (ref 3.5–5.1)
Sodium: 138 meq/L (ref 135–145)

## 2023-06-25 ENCOUNTER — Other Ambulatory Visit: Payer: Self-pay | Admitting: Internal Medicine

## 2023-06-26 ENCOUNTER — Other Ambulatory Visit: Payer: Self-pay | Admitting: Internal Medicine

## 2023-06-26 ENCOUNTER — Telehealth: Payer: Self-pay | Admitting: Internal Medicine

## 2023-06-26 MED ORDER — ONETOUCH ULTRA VI STRP: ORAL_STRIP | 10 refills | Status: DC

## 2023-06-26 NOTE — Telephone Encounter (Signed)
[facility] called and would like [place] to re send the script one touch test strips. Rx stated as directed but for insurance purposes they will not accept.  Please re send with frequency of use.

## 2023-06-26 NOTE — Telephone Encounter (Signed)
Rx has been resent 

## 2023-06-28 ENCOUNTER — Ambulatory Visit: Payer: Medicare HMO | Admitting: Physician Assistant

## 2023-06-28 ENCOUNTER — Encounter: Payer: Medicare HMO | Attending: Physician Assistant | Admitting: Physician Assistant

## 2023-06-28 DIAGNOSIS — I10 Essential (primary) hypertension: Secondary | ICD-10-CM | POA: Insufficient documentation

## 2023-06-28 DIAGNOSIS — Z87891 Personal history of nicotine dependence: Secondary | ICD-10-CM | POA: Diagnosis not present

## 2023-06-28 DIAGNOSIS — E11622 Type 2 diabetes mellitus with other skin ulcer: Secondary | ICD-10-CM | POA: Insufficient documentation

## 2023-06-28 DIAGNOSIS — L97812 Non-pressure chronic ulcer of other part of right lower leg with fat layer exposed: Secondary | ICD-10-CM | POA: Insufficient documentation

## 2023-06-28 DIAGNOSIS — T81328A Disruption or dehiscence of closure of other specified internal operation (surgical) wound, initial encounter: Secondary | ICD-10-CM | POA: Diagnosis not present

## 2023-06-28 NOTE — Progress Notes (Signed)
Electronic Signature(s) Signed: 06/28/2023 1:30:06 PM By: Allen Derry PA-C Entered By: Allen Derry on 06/28/2023 10:30:06 -------------------------------------------------------------------------------- Physician Orders Details Patient Name: Date of Service: [name]. 06/28/2023 10:00 A M Medical Record Number: [id number] Patient Account Number: [id number] Date of Birth/Sex: Treating RN: June 09, 1941 (82 y.o. 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 Bathing/ Shower/ Hygiene May shower; gently cleanse wound with antibacterial soap, rinse and pat dry prior to dressing wounds No tub bath. Anesthetic (Use 'Patient Medications' Section for Anesthetic Order Entry) Lidocaine applied to wound bed Edema Control - Orders / Instructions Elevate, Exercise Daily and A void Standing for Long Periods of Time. Elevate leg(s) parallel to the floor when sitting. DO YOUR BEST to sleep  in the bed at night. DO NOT sleep in your recliner. Long hours of sitting in a recliner leads to swelling of the legs and/or potential wounds on your backside. Wound Treatment Wound #1 - Lower Leg Wound Laterality: Right, Posterior Cleanser: Soap and Water 3 x Per Week/15 Days Discharge Instructions: Gently cleanse wound with antibacterial soap, rinse and pat dry prior to dressing wounds Cleanser: Wound Cleanser 3 x Per Week/15 Days Discharge Instructions: Wash your hands with soap and water. Remove old dressing, discard into plastic bag and place into trash. Cleanse the wound with Wound Cleanser prior to applying a clean dressing using gauze sponges, not tissues or cotton balls. Do not scrub or use excessive force. Pat dry using gauze sponges, not tissue or cotton balls. Prim Dressing: Aquacel Extra Hydrofiber Dressing, 2x2 (in/in) ary 3 x Per Week/15 Days Secondary Dressing: Coverlet Latex-Free Fabric Adhesive Dressings 3 x Per Week/15 Days Discharge Instructions: 1.5 x 2 Secured With: Tubigrip Size D, 3x10 (in/yd) 3 x Per Week/15 Days Discharge Instructions: single layer Electronic Signature(s) Signed: 06/28/2023 1:21:28 PM By: Angelina Pih Signed: 06/28/2023 1:31:37 PM By: Allen Derry PA-C Entered By: Angelina Pih on 06/28/2023 09:03:34 -------------------------------------------------------------------------------- Problem List Details Patient Name: Date of Service: [name]. 06/28/2023 10:00 A M Medical Record Number: [id number] Patient Account Number: [id number] Date of Birth/Sex: Treating RN: Jan 01, 1941 (82 y.o. 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 06/28/2023 10:00 A M Medical Record Number: [id number] Patient Account Number: [id number]  Electronic Signature(s) Signed: 06/28/2023 1:30:06 PM By: Allen Derry PA-C Entered By: Allen Derry on 06/28/2023 10:30:06 -------------------------------------------------------------------------------- Physician Orders Details Patient Name: Date of Service: [name]. 06/28/2023 10:00 A M Medical Record Number: [id number] Patient Account Number: [id number] Date of Birth/Sex: Treating RN: June 09, 1941 (82 y.o. 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 Bathing/ Shower/ Hygiene May shower; gently cleanse wound with antibacterial soap, rinse and pat dry prior to dressing wounds No tub bath. Anesthetic (Use 'Patient Medications' Section for Anesthetic Order Entry) Lidocaine applied to wound bed Edema Control - Orders / Instructions Elevate, Exercise Daily and A void Standing for Long Periods of Time. Elevate leg(s) parallel to the floor when sitting. DO YOUR BEST to sleep  in the bed at night. DO NOT sleep in your recliner. Long hours of sitting in a recliner leads to swelling of the legs and/or potential wounds on your backside. Wound Treatment Wound #1 - Lower Leg Wound Laterality: Right, Posterior Cleanser: Soap and Water 3 x Per Week/15 Days Discharge Instructions: Gently cleanse wound with antibacterial soap, rinse and pat dry prior to dressing wounds Cleanser: Wound Cleanser 3 x Per Week/15 Days Discharge Instructions: Wash your hands with soap and water. Remove old dressing, discard into plastic bag and place into trash. Cleanse the wound with Wound Cleanser prior to applying a clean dressing using gauze sponges, not tissues or cotton balls. Do not scrub or use excessive force. Pat dry using gauze sponges, not tissue or cotton balls. Prim Dressing: Aquacel Extra Hydrofiber Dressing, 2x2 (in/in) ary 3 x Per Week/15 Days Secondary Dressing: Coverlet Latex-Free Fabric Adhesive Dressings 3 x Per Week/15 Days Discharge Instructions: 1.5 x 2 Secured With: Tubigrip Size D, 3x10 (in/yd) 3 x Per Week/15 Days Discharge Instructions: single layer Electronic Signature(s) Signed: 06/28/2023 1:21:28 PM By: Angelina Pih Signed: 06/28/2023 1:31:37 PM By: Allen Derry PA-C Entered By: Angelina Pih on 06/28/2023 09:03:34 -------------------------------------------------------------------------------- Problem List Details Patient Name: Date of Service: [name]. 06/28/2023 10:00 A M Medical Record Number: [id number] Patient Account Number: [id number] Date of Birth/Sex: Treating RN: Jan 01, 1941 (82 y.o. 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 06/28/2023 10:00 A M Medical Record Number: [id number] Patient Account Number: [id number]  Electronic Signature(s) Signed: 06/28/2023 1:30:06 PM By: Allen Derry PA-C Entered By: Allen Derry on 06/28/2023 10:30:06 -------------------------------------------------------------------------------- Physician Orders Details Patient Name: Date of Service: [name]. 06/28/2023 10:00 A M Medical Record Number: [id number] Patient Account Number: [id number] Date of Birth/Sex: Treating RN: June 09, 1941 (82 y.o. 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 Bathing/ Shower/ Hygiene May shower; gently cleanse wound with antibacterial soap, rinse and pat dry prior to dressing wounds No tub bath. Anesthetic (Use 'Patient Medications' Section for Anesthetic Order Entry) Lidocaine applied to wound bed Edema Control - Orders / Instructions Elevate, Exercise Daily and A void Standing for Long Periods of Time. Elevate leg(s) parallel to the floor when sitting. DO YOUR BEST to sleep  in the bed at night. DO NOT sleep in your recliner. Long hours of sitting in a recliner leads to swelling of the legs and/or potential wounds on your backside. Wound Treatment Wound #1 - Lower Leg Wound Laterality: Right, Posterior Cleanser: Soap and Water 3 x Per Week/15 Days Discharge Instructions: Gently cleanse wound with antibacterial soap, rinse and pat dry prior to dressing wounds Cleanser: Wound Cleanser 3 x Per Week/15 Days Discharge Instructions: Wash your hands with soap and water. Remove old dressing, discard into plastic bag and place into trash. Cleanse the wound with Wound Cleanser prior to applying a clean dressing using gauze sponges, not tissues or cotton balls. Do not scrub or use excessive force. Pat dry using gauze sponges, not tissue or cotton balls. Prim Dressing: Aquacel Extra Hydrofiber Dressing, 2x2 (in/in) ary 3 x Per Week/15 Days Secondary Dressing: Coverlet Latex-Free Fabric Adhesive Dressings 3 x Per Week/15 Days Discharge Instructions: 1.5 x 2 Secured With: Tubigrip Size D, 3x10 (in/yd) 3 x Per Week/15 Days Discharge Instructions: single layer Electronic Signature(s) Signed: 06/28/2023 1:21:28 PM By: Angelina Pih Signed: 06/28/2023 1:31:37 PM By: Allen Derry PA-C Entered By: Angelina Pih on 06/28/2023 09:03:34 -------------------------------------------------------------------------------- Problem List Details Patient Name: Date of Service: [name]. 06/28/2023 10:00 A M Medical Record Number: [id number] Patient Account Number: [id number] Date of Birth/Sex: Treating RN: Jan 01, 1941 (82 y.o. 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 06/28/2023 10:00 A M Medical Record Number: [id number] Patient Account Number: [id number]  Date of Birth/Sex: Treating RN: 11/19/1940 (82 y.o. [name] Primary Care Provider: Duncan Dull Other Clinician: Referring Provider: Treating Provider/Extender: Dewayne Hatch Weeks in Treatment: 0 Subjective Chief Complaint Information obtained from Patient Right LE Ulcer following biopsy History of Present Illness (HPI) 06-28-2023 upon evaluation today patient presents for initial evaluation here in our clinic concerning issues that she has been having with a wound on the right posterior lower extremity. This is actually a biopsy site that was performed by dermatology. She has been using as they recommended Vaseline on the area and a Band-Aid but I think has been staying too wet therefore it has not healed appropriately. Fortunately I do not see any evidence of active infection locally or systemically which is great news. No fevers, chills, nausea, vomiting, or diarrhea. Patient's hemoglobin A1c on 06-10-2023 was 6.3 and does appear to be doing awesome. She does have diabetes mellitus type 2 and hypertension but no other major medical problems. Patient History Information obtained from Patient, Chart. Allergies latex, nickel [name], [name] ([id number]) [id number].pdf Page 5 of 9 Social History Former smoker - 30+ years, Alcohol Use - Never, Drug Use - No History, Caffeine Use - Daily - coffee. Medical History Eyes Patient has history of Cataracts - extraction 2016 bilat Cardiovascular Patient has history of Hypertension Endocrine Patient has history of Type II Diabetes Patient is treated with Oral Agents. Blood sugar is tested. Blood sugar results noted at the following times: Breakfast - 82. Review of Systems (ROS) Constitutional Symptoms (General Health) Denies complaints or symptoms of Fatigue, Fever, Chills, Marked Weight Change. Eyes Denies complaints or symptoms of Dry Eyes, Vision Changes, Glasses / Contacts. Ear/Nose/Mouth/Throat Denies complaints or symptoms of Difficult clearing ears, Sinusitis. Hematologic/Lymphatic Denies complaints or symptoms of Bleeding / Clotting Disorders, Human Immunodeficiency Virus. Respiratory Denies complaints or symptoms of Chronic or frequent coughs, Shortness of Breath. Gastrointestinal Denies complaints or symptoms of Frequent diarrhea, Nausea, Vomiting. Endocrine Complains or has symptoms of Thyroid disease - hypo. Genitourinary Denies complaints or symptoms of Kidney failure/ Dialysis, Incontinence/dribbling, hx kidney stones Immunological Denies complaints or symptoms of Hives, Itching. Integumentary (Skin) Denies complaints or symptoms of Wounds, Bleeding or bruising tendency, Breakdown, Swelling. Musculoskeletal Denies complaints or symptoms of Muscle Pain, Muscle Weakness, osteoporosis Neurologic Denies complaints or symptoms of Numbness/parasthesias, Focal/Weakness. Psychiatric Complains or has symptoms of Anxiety. Denies complaints or symptoms of Claustrophobia. Objective Constitutional patient is hypertensive.. pulse regular and within target range for patient.Marland Kitchen respirations regular, non-labored and within target range for  patient.Marland Kitchen temperature within target range for patient.. Well-nourished and well-hydrated in no acute distress. Vitals Time Taken: 10:24 AM, Height: 63 in, Source: Stated, Weight: 160 lbs, Source: Stated, BMI: 28.3, Temperature: 97.9 F, Pulse: 53 bpm, Respiratory Rate: 18 breaths/min, Blood Pressure: 161/70 mmHg. Eyes conjunctiva clear no eyelid edema noted. pupils equal round and reactive to light and accommodation. Ears, Nose, Mouth, and Throat no gross abnormality of ear auricles or external auditory canals. normal hearing noted during conversation. mucus membranes moist. Respiratory normal breathing without difficulty. Cardiovascular 2+ dorsalis pedis/posterior tibialis pulses. no clubbing, cyanosis, significant edema, Musculoskeletal normal gait and posture. no significant deformity or arthritic changes, no loss or range of motion, no clubbing. Psychiatric this patient is able to make decisions and demonstrates good insight into disease process. Alert and Oriented x 3. pleasant and cooperative. General Notes: Upon inspection patient's wound bed actually showed signs of no need for debridement and actually appears very clean and is excellent as  Date of Birth/Sex: Treating RN: 11/19/1940 (82 y.o. [name] Primary Care Provider: Duncan Dull Other Clinician: Referring Provider: Treating Provider/Extender: Dewayne Hatch Weeks in Treatment: 0 Subjective Chief Complaint Information obtained from Patient Right LE Ulcer following biopsy History of Present Illness (HPI) 06-28-2023 upon evaluation today patient presents for initial evaluation here in our clinic concerning issues that she has been having with a wound on the right posterior lower extremity. This is actually a biopsy site that was performed by dermatology. She has been using as they recommended Vaseline on the area and a Band-Aid but I think has been staying too wet therefore it has not healed appropriately. Fortunately I do not see any evidence of active infection locally or systemically which is great news. No fevers, chills, nausea, vomiting, or diarrhea. Patient's hemoglobin A1c on 06-10-2023 was 6.3 and does appear to be doing awesome. She does have diabetes mellitus type 2 and hypertension but no other major medical problems. Patient History Information obtained from Patient, Chart. Allergies latex, nickel [name], [name] ([id number]) [id number].pdf Page 5 of 9 Social History Former smoker - 30+ years, Alcohol Use - Never, Drug Use - No History, Caffeine Use - Daily - coffee. Medical History Eyes Patient has history of Cataracts - extraction 2016 bilat Cardiovascular Patient has history of Hypertension Endocrine Patient has history of Type II Diabetes Patient is treated with Oral Agents. Blood sugar is tested. Blood sugar results noted at the following times: Breakfast - 82. Review of Systems (ROS) Constitutional Symptoms (General Health) Denies complaints or symptoms of Fatigue, Fever, Chills, Marked Weight Change. Eyes Denies complaints or symptoms of Dry Eyes, Vision Changes, Glasses / Contacts. Ear/Nose/Mouth/Throat Denies complaints or symptoms of Difficult clearing ears, Sinusitis. Hematologic/Lymphatic Denies complaints or symptoms of Bleeding / Clotting Disorders, Human Immunodeficiency Virus. Respiratory Denies complaints or symptoms of Chronic or frequent coughs, Shortness of Breath. Gastrointestinal Denies complaints or symptoms of Frequent diarrhea, Nausea, Vomiting. Endocrine Complains or has symptoms of Thyroid disease - hypo. Genitourinary Denies complaints or symptoms of Kidney failure/ Dialysis, Incontinence/dribbling, hx kidney stones Immunological Denies complaints or symptoms of Hives, Itching. Integumentary (Skin) Denies complaints or symptoms of Wounds, Bleeding or bruising tendency, Breakdown, Swelling. Musculoskeletal Denies complaints or symptoms of Muscle Pain, Muscle Weakness, osteoporosis Neurologic Denies complaints or symptoms of Numbness/parasthesias, Focal/Weakness. Psychiatric Complains or has symptoms of Anxiety. Denies complaints or symptoms of Claustrophobia. Objective Constitutional patient is hypertensive.. pulse regular and within target range for patient.Marland Kitchen respirations regular, non-labored and within target range for  patient.Marland Kitchen temperature within target range for patient.. Well-nourished and well-hydrated in no acute distress. Vitals Time Taken: 10:24 AM, Height: 63 in, Source: Stated, Weight: 160 lbs, Source: Stated, BMI: 28.3, Temperature: 97.9 F, Pulse: 53 bpm, Respiratory Rate: 18 breaths/min, Blood Pressure: 161/70 mmHg. Eyes conjunctiva clear no eyelid edema noted. pupils equal round and reactive to light and accommodation. Ears, Nose, Mouth, and Throat no gross abnormality of ear auricles or external auditory canals. normal hearing noted during conversation. mucus membranes moist. Respiratory normal breathing without difficulty. Cardiovascular 2+ dorsalis pedis/posterior tibialis pulses. no clubbing, cyanosis, significant edema, Musculoskeletal normal gait and posture. no significant deformity or arthritic changes, no loss or range of motion, no clubbing. Psychiatric this patient is able to make decisions and demonstrates good insight into disease process. Alert and Oriented x 3. pleasant and cooperative. General Notes: Upon inspection patient's wound bed actually showed signs of no need for debridement and actually appears very clean and is excellent as

## 2023-06-28 NOTE — Progress Notes (Signed)
Megan, [name] ([id number]) [phone] [id number] Page 1 of 5 Visit Report for 06/28/2023 Abuse Risk Screen Details Patient Name: Date of Service: [name], [name] 06/28/2023 10:00 A M Medical Record Number: [id number] Patient Account Number: [id number] Date of Birth/Sex: Treating RN: Mar 06, 1941 (82 y.o. [name] Primary Care Telisha Zawadzki: Duncan Dull Other Clinician: Referring Muskan Bolla: Treating Pate Aylward/Extender: Dewayne Hatch Weeks in Treatment: 0 Abuse Risk Screen Items Answer ABUSE RISK SCREEN: Has anyone close to you tried to hurt or harm you recentlyo No Do you feel uncomfortable with anyone in your familyo No Has anyone forced you do things that you didnt want to doo No Electronic Signature(s) Signed: 06/28/2023 1:21:28 PM By: Angelina Pih Entered By: Angelina Pih on 06/28/2023 07:31:18 -------------------------------------------------------------------------------- Activities of Daily Living Details Patient Name: Date of Service: Megan, [name] 06/28/2023 10:00 A M Medical Record Number: [id number] Patient Account Number: [id number] Date of Birth/Sex: Treating RN: Jan 30, 1941 (82 y.o. [name] Primary Care Dynasia Kercheval: Duncan Dull Other Clinician: Referring Meliyah Simon: Treating Judy Pollman/Extender: Dewayne Hatch Weeks in Treatment: 0 Activities of Daily Living Items Answer Activities of Daily Living (Please select one for each item) Drive Automobile Completely Able T Medications ake Completely Able Use T elephone Completely Able Care for Appearance Completely Able Use T oilet Completely Able Bath / Shower Completely Able Dress Self Completely Able Feed Self Completely Able Walk Completely Able Get In / Out Bed Completely Able Housework Completely ROSICELA, BAYON [name] ([id number]) [phone] [id number] Page 2 of 5 Prepare Meals Completely Able Handle Money Completely  Able Shop for Self Completely Able Electronic Signature(s) Signed: 06/28/2023 1:21:28 PM By: Angelina Pih Entered By: Angelina Pih on 06/28/2023 07:31:41 -------------------------------------------------------------------------------- Education Screening Details Patient Name: Date of Service: [name]. 06/28/2023 10:00 A M Medical Record Number: [id number] Patient Account Number: [id number] Date of Birth/Sex: Treating RN: 08-Jan-1941 (82 y.o. [name] Primary Care Donnamaria Shands: Duncan Dull Other Clinician: Referring Jesus Poplin: Treating Tiphani Mells/Extender: Doristine Devoid in Treatment: 0 Primary Learner Assessed: Patient Learning Preferences/Education Level/Primary Language Learning Preference: Explanation, Demonstration, Video, Communication Board, Printed Material Preferred Language: English Cognitive Barrier Language Barrier: No Translator Needed: No Memory Deficit: No Emotional Barrier: No Cultural/Religious Beliefs Affecting Medical Care: No Physical Barrier Impaired Vision: No Impaired Hearing: No Decreased Hand dexterity: No Knowledge/Comprehension Knowledge Level: High Comprehension Level: High Ability to understand written instructions: High Ability to understand verbal instructions: High Motivation Anxiety Level: Calm Cooperation: Cooperative Education Importance: Acknowledges Need Interest in Health Problems: Asks Questions Perception: Coherent Willingness to Engage in Self-Management High Activities: Readiness to Engage in Self-Management High Activities: Electronic Signature(s) Signed: 06/28/2023 12:00:10 PM By: Angelina Pih Entered By: Angelina Pih on 06/28/2023 09:00:09 [name] ([id number]) [id number] Page 3 of 5 -------------------------------------------------------------------------------- Fall Risk Assessment Details Patient Name: Date of Service: Megan, [name]  06/28/2023 10:00 A M Medical Record Number: [id number] Patient Account Number: [id number] Date of Birth/Sex: Treating RN: 03/02/1941 (82 y.o. [name] Primary Care Denica Web: Duncan Dull Other Clinician: Referring Pierson Vantol: Treating Olena Willy/Extender: Dewayne Hatch Weeks in Treatment: 0 Fall Risk Assessment Items Have you had 2 or more falls in the last 12 monthso 0 No Have you had any fall that resulted in injury in the last 12 monthso 0 No FALLS RISK SCREEN History of falling - immediate or within 3 months 0 No Secondary diagnosis (Do you have 2 or more medical diagnoseso) 0 No Ambulatory aid None/bed  rest/wheelchair/nurse 0 Yes Crutches/cane/walker 0 No Furniture 0 No Intravenous therapy Access/Saline/Heparin Lock 0 No Gait/Transferring Normal/ bed rest/ wheelchair 0 Yes Weak (short steps with or without shuffle, stooped but able to lift head while walking, may seek 0 No support from furniture) Impaired (short steps with shuffle, may have difficulty arising from chair, head down, impaired 0 No balance) Mental Status Oriented to own ability 0 Yes Electronic Signature(s) Signed: 06/28/2023 1:21:28 PM By: Angelina Pih Entered By: Angelina Pih on 06/28/2023 07:31:53 -------------------------------------------------------------------------------- Foot Assessment Details Patient Name: Date of Service: [name]. 06/28/2023 10:00 A M Medical Record Number: [id number] Patient Account Number: [id number] Date of Birth/Sex: Treating RN: 08/23/1941 (82 y.o. [name] Primary Care Shawnee Higham: Duncan Dull Other Clinician: Referring Saavi Mceachron: Treating Analeise Mccleery/Extender: Dewayne Hatch Weeks in Treatment: 0 Foot Assessment Items Site Locations [place], [place] [name] ([id number]) [id number] Page 4 of 5 + = Sensation present, - = Sensation absent, C = Callus, U = Ulcer R = Redness, W = Warmth, M =  Maceration, PU = Pre-ulcerative lesion F = Fissure, S = Swelling, D = Dryness Assessment Right: Left: Other Deformity: No No Prior Foot Ulcer: No No Prior Amputation: No No Charcot Joint: No No Ambulatory Status: Ambulatory Without Help Gait: Steady Electronic Signature(s) Signed: 06/28/2023 1:21:28 PM By: Angelina Pih Entered By: Angelina Pih on 06/28/2023 07:37:21 -------------------------------------------------------------------------------- Nutrition Risk Screening Details Patient Name: Date of Service: TESSLYN, MCCAGUE [name]. 06/28/2023 10:00 A M Medical Record Number: [id number] Patient Account Number: [id number] Date of Birth/Sex: Treating RN: [birth year] (82 y.o. [name] Primary Care Jiovanny Burdell: Duncan Dull Other Clinician: Referring Karmon Andis: Treating Karess Harner/Extender: Dewayne Hatch Weeks in Treatment: 0 Height (in): 63 Weight (lbs): 160 Body Mass Index (BMI): 28.3 Nutrition Risk Screening Items Score Screening NUTRITION RISK SCREEN: I have an illness or condition that made me change the kind and/or amount of food I eat 0 No I eat fewer than two meals per day 0 No I eat few fruits and vegetables, or milk products 0 No I have three or more drinks of beer, liquor or wine almost every day 0 No I have tooth or mouth problems that make it hard for me to eat 0 No I don't always have enough money to buy the food I need 0 No Buchinger, Raynette [name] ([id number]) [id number] Page 5 of 5 I eat alone most of the time 0 No I take three or more different prescribed or over-the-counter drugs a day 0 No Without wanting to, I have lost or gained 10 pounds in the last six months 0 No I am not always physically able to shop, cook and/or feed myself 0 No Nutrition Protocols Good Risk Protocol 0 No interventions needed Moderate Risk Protocol High Risk Proctocol Risk Level: Good Risk Score: 0 Electronic Signature(s) Signed:  06/28/2023 1:21:28 PM By: Angelina Pih Entered By: Angelina Pih on 06/28/2023 07:32:06

## 2023-06-28 NOTE — Progress Notes (Signed)
M Medical Record Number: [id number] Patient Account Number: [id number] Date of Birth/Sex: Treating RN: Jun 13, 1941 (82 y.o. [name] Primary Care Aneliese Beaudry: Duncan Dull Other Clinician: Referring  Sherine Cortese: Treating Shevaun Lovan/Extender: Dewayne Hatch Weeks in Treatment: 0 Wound Status Wound Number: 1 Primary Etiology: Open Surgical Wound Wound Location: Right, Posterior Lower Leg Wound Status: Open Wounding Event: Surgical Injury Comorbid History: Hypertension, Type II Diabetes Date Acquired: 05/13/2023 Weeks Of Treatment: 0 Clustered Wound: No Photos Wound Measurements Length: (cm) 0.3 Width: (cm) 0.3 Depth: (cm) 0.1 Area: (cm) 0.071 Volume: (cm) 0.007 % Reduction in Area: % Reduction in Volume: Epithelialization: None Tunneling: No Undermining: No Wound Description Classification: Full Thickness Without Exposed Suppor Exudate Amount: Medium Exudate Type: Serosanguineous Exudate Color: red, brown t Structures Foul Odor After Cleansing: No Slough/Fibrino Yes Wound Bed Granulation Amount: Medium (34-66%) Exposed Structure Granulation Quality: Red Fat Layer (Subcutaneous Tissue) Exposed: Yes Necrotic Amount: Medium (34-66%) Necrotic Quality: Adherent Slough Treatment Notes Wound #1 (Lower Leg) Wound Laterality: Right, Posterior Hayashida, [name] Page 9 of 9 Cleanser Soap and Water Discharge Instruction: Gently cleanse wound with antibacterial soap, rinse and pat dry prior to dressing wounds Wound Cleanser Discharge Instruction: Wash your hands with soap and water. Remove old dressing, discard into plastic bag and place into trash. Cleanse the wound with Wound Cleanser prior to applying a clean dressing using gauze sponges, not tissues or cotton balls. Do not scrub or use excessive force. Pat dry using gauze sponges, not tissue or cotton balls. Peri-Wound Care Topical Primary Dressing Aquacel Extra Hydrofiber Dressing, 2x2 (in/in) Secondary Dressing Coverlet Latex-Free Fabric Adhesive Dressings Discharge Instruction: 1.5 x 2 Secured With Compression Wrap Compression Stockings Add-Ons Electronic  Signature(s) Signed: 06/28/2023 1:21:28 PM By: Angelina Pih Entered By: Angelina Pih on 06/28/2023 07:45:28 -------------------------------------------------------------------------------- Vitals Details Patient Name: Date of Service: Megan Salk RO N B. 06/28/2023 10:00 A M Medical Record Number: [id number] Patient Account Number: [id number] Date of Birth/Sex: Treating RN: [birth year] (82 y.o. [name] Primary Care Evelene Roussin: Duncan Dull Other Clinician: Referring Arvil Utz: Treating Giani Betzold/Extender: Dewayne Hatch Weeks in Treatment: 0 Vital Signs Time Taken: 10:24 Temperature ([name]): 97.9 Height (in): 63 Pulse (bpm): 53 Source: Stated Respiratory Rate (breaths/min): 18 Weight (lbs): 160 Blood Pressure (mmHg): 161/70 Source: Stated Reference Range: 80 - 120 mg / dl Body Mass Index (BMI): 28.3 Electronic Signature(s) Signed: 06/28/2023 1:21:28 PM By: Angelina Pih Entered By: Angelina Pih on 06/28/2023 07:25:50  Measurement - multiple wounds X- 1 5 Simple Wound Cleansing - one wound []  - 0 Complex Wound Cleansing - multiple wounds INTERVENTIONS - Wound Dressings X - Small Wound Dressing one or multiple wounds 1 10 []  - 0 Medium Wound Dressing one or multiple wounds []  - 0 Large Wound Dressing one or multiple wounds []  - 0 Application of Medications - injection INTERVENTIONS - Miscellaneous []  - 0 External ear exam []  - 0 Specimen Collection (cultures, biopsies, blood, body fluids, etc.) []  - 0 Specimen(s) / Culture(s) sent or taken to Lab for analysis []  - 0 Patient Transfer (multiple staff / Michiel Sites Lift / Similar devices) []  - 0 Simple Staple / Suture removal (25 or less) []  - 0 Complex Staple / Suture removal (26 or more) []  - 0 Hypo / Hyperglycemic Management (close monitor of Blood Glucose) X- 1 15 Ankle / Brachial Index (ABI) - do not check if billed separately Has the patient been seen at the hospital within the last three years: Yes Total Score: 140 Level Of Care: New/Established - Level 4 Electronic Signature(s) Signed: 06/28/2023 1:21:28 PM By: Angelina Pih Entered By: Angelina Pih on 06/28/2023 09:01:00 -------------------------------------------------------------------------------- Encounter Discharge Information Details Patient Name: Date of Service: Megan Salk RO Dorris Carnes B. 06/28/2023 10:00 A M Medical Record Number: [id number] Patient Account Number: [id number] Date of Birth/Sex: Treating RN: [birth year] (82 y.o. [name] Primary Care Domingue Coltrain: Duncan Dull Other Clinician: Referring Eluterio Seymour: Treating Armarion Greek/Extender: Doristine Devoid in Treatment: 0 Encounter Discharge Information Items Discharge Condition: Stable Ambulatory Status: Ambulatory Discharge Destination: Home Transportation: Private Auto Accompanied By: self Schedule Follow-up Appointment: Yes Clinical Summary of Care: Electronic Signature(s) Signed: 06/28/2023 12:02:59 PM By: Doree Albee, Jasmine December B ([id number]) [id number] Page 4 of 9 Signed: 06/28/2023 12:02:59 PM By: Angelina Pih Entered By: Angelina Pih on 06/28/2023 09:02:59 -------------------------------------------------------------------------------- Lower Extremity Assessment Details Patient Name: Date of Service: [name], [name] 06/28/2023 10:00 A M Medical Record Number: [id number] Patient Account Number: [id number] Date of Birth/Sex: Treating RN: [birth year] (82 y.o. [name], Luther Parody Primary Care Rodgers Likes: Duncan Dull Other Clinician: Referring Rhyatt Muska: Treating Lee Kalt/Extender: Dewayne Hatch Weeks in Treatment: 0 Edema Assessment Assessed: [Left: No] [Right: No] Edema: [Left: N] [Right: o] Calf Left: Right: Point of Measurement: 33 cm From Medial Instep 38 cm Ankle Left: Right: Point of Measurement: 11 cm From Medial Instep 20.8 cm Vascular Assessment Pulses: Dorsalis Pedis Palpable: [Right:Yes] Posterior Tibial Palpable: [Right:Yes] Extremity colors, hair growth, and conditions: Extremity Color: [Right:Normal] Hair Growth on Extremity: [Right:Yes] Temperature of Extremity: [Right:Cool] Capillary Refill: [Right:< 3 seconds] Blood Pressure: Brachial: [Right:161] Ankle: [Right:Dorsalis Pedis: 140 0.87] Toe Nail Assessment Left: Right: Thick: No Discolored: No Deformed: No Improper Length and Hygiene: No Electronic Signature(s) Signed: 06/28/2023 1:21:28 PM By: Angelina Pih Entered By: Angelina Pih on 06/28/2023 07:43:56 [name] ([id number])  [id number] Page 5 of 9 -------------------------------------------------------------------------------- Multi Wound Chart Details Patient Name: Date of Service: [name], [name] 06/28/2023 10:00 A M Medical Record Number: [id number] Patient Account Number: [id number] Date of Birth/Sex: Treating RN: 09-01-1941 (82 y.o. [name] Primary Care Ayson Cherubini: Duncan Dull Other Clinician: Referring Jaylei Fuerte: Treating Vastie Douty/Extender: Dewayne Hatch Weeks in Treatment: 0 Vital Signs Height(in): 63 Pulse(bpm): 53 Weight(lbs): 160 Blood Pressure(mmHg): 161/70 Body Mass Index(BMI): 28.3 Temperature([name]): 97.9 Respiratory Rate(breaths/min): 18 [1:Photos:] [N/A:N/A] Right, Posterior Lower Leg N/A N/A Wound Location: Surgical Injury N/A N/A Wounding Event: Open Surgical Wound N/A N/A Primary Etiology: Hypertension, Type II  OHANA, COY B ([id number]) [id number] Page 1 of 9 Visit Report for 06/28/2023 Allergy List Details Patient Name: Date of Service: RAGENA, DOVALE 06/28/2023 10:00 A M Medical Record Number: [id number] Patient Account Number: [id number] Date of Birth/Sex: Treating RN: [birth year] (82 y.o. [name] Primary Care Cayleb Jarnigan: Duncan Dull Other Clinician: Referring Addisen Chappelle: Treating Mohammedali Bedoy/Extender: Dewayne Hatch Weeks in Treatment: 0 Allergies Active Allergies latex nickel Allergy Notes Electronic Signature(s) Signed: 06/28/2023 1:21:28 PM By: Angelina Pih Entered By: Angelina Pih on 06/28/2023 78:29:56 -------------------------------------------------------------------------------- Arrival Information Details Patient Name: Date of Service: Megan Cheeks B. 06/28/2023 10:00 A M Medical Record Number: [id number] Patient Account Number: [id number] Date of Birth/Sex: Treating RN: Jun 16, 1941 (82 y.o. [name] Primary Care Zeven Kocak: Duncan Dull Other Clinician: Referring Arabella Revelle: Treating Chianna Spirito/Extender: Doristine Devoid in Treatment: 0 Visit Information Patient Arrived: Ambulatory Arrival Time: 10:21 Accompanied By: grand daughter Transfer Assistance: None Patient Identification Verified: Yes Secondary Verification Process Completed: Yes Electronic Signature(s) Signed: 06/28/2023 1:21:28 PM By: Angelina Pih Entered By: Angelina Pih on 06/28/2023 07:23:53 [name] ([id number]) [id number] Page 2 of 9 -------------------------------------------------------------------------------- Clinic Level of Care Assessment Details Patient Name: Date of Service: [name], [name] 06/28/2023 10:00 A M Medical Record Number: [id number] Patient Account Number: [id number] Date of Birth/Sex: Treating RN: 04/01/1941 (82 y.o. [name] Primary Care Suha Schoenbeck:  Duncan Dull Other Clinician: Referring Starlet Gallentine: Treating Naureen Benton/Extender: Doristine Devoid in Treatment: 0 Clinic Level of Care Assessment Items TOOL 2 Quantity Score []  - 0 Use when only an EandM is performed on the INITIAL visit ASSESSMENTS - Nursing Assessment / Reassessment X- 1 20 General Physical Exam (combine w/ comprehensive assessment (listed just below) when performed on new pt. evals) X- 1 25 Comprehensive Assessment (HX, ROS, Risk Assessments, Wounds Hx, etc.) ASSESSMENTS - Wound and Skin A ssessment / Reassessment X - Simple Wound Assessment / Reassessment - one wound 1 5 []  - 0 Complex Wound Assessment / Reassessment - multiple wounds []  - 0 Dermatologic / Skin Assessment (not related to wound area) ASSESSMENTS - Ostomy and/or Continence Assessment and Care []  - 0 Incontinence Assessment and Management []  - 0 Ostomy Care Assessment and Management (repouching, etc.) PROCESS - Coordination of Care X - Simple Patient / Family Education for ongoing care 1 15 []  - 0 Complex (extensive) Patient / Family Education for ongoing care X- 1 10 Staff obtains Chiropractor, Records, T Results / Process Orders est []  - 0 Staff telephones HHA, Nursing Homes / Clarify orders / etc []  - 0 Routine Transfer to another Facility (non-emergent condition) []  - 0 Routine Hospital Admission (non-emergent condition) X- 1 15 New Admissions / Manufacturing engineer / Ordering NPWT Apligraf, etc. , []  - 0 Emergency Hospital Admission (emergent condition) X- 1 10 Simple Discharge Coordination []  - 0 Complex (extensive) Discharge Coordination PROCESS - Special Needs []  - 0 Pediatric / Minor Patient Management []  - 0 Isolation Patient Management []  - 0 Hearing / Language / Visual special needs []  - 0 Assessment of Community assistance (transportation, D/C planning, etc.) []  - 0 Additional assistance / Altered mentation []  - 0 Support Surface(s) Assessment  (bed, cushion, seat, etc.) INTERVENTIONS - Wound Cleansing / Measurement X- 1 5 Wound Imaging (photographs - any number of wounds) Vernet, Shakayla B ([id number]) [id number] Page 3 of 9 []  - 0 Wound Tracing (instead of photographs) X- 1 5 Simple Wound Measurement - one wound []  - 0 Complex Wound  Diabetes N/A N/A Comorbid History: 05/13/2023 N/A N/A Date Acquired: 0 N/A N/A Weeks of Treatment: Open N/A N/A Wound Status: No N/A N/A Wound Recurrence: 0.3x0.3x0.1 N/A N/A Measurements L x W x D (cm) 0.071 N/A N/A A (cm) : rea 0.007 N/A N/A Volume (cm) : Full Thickness Without Exposed N/A N/A Classification: Support Structures Medium N/A N/A Exudate A mount: Serosanguineous N/A N/A Exudate Type: red, brown N/A N/A Exudate Color: Medium (34-66%) N/A N/A Granulation A mount: Red N/A N/A Granulation Quality: Medium (34-66%) N/A N/A Necrotic A mount: Fat Layer (Subcutaneous Tissue): Yes N/A N/A Exposed Structures: None N/A N/A Epithelialization: Treatment Notes Electronic Signature(s) Signed: 06/28/2023 12:00:18 PM By: Angelina Pih Entered By: Angelina Pih on 06/28/2023 09:00:17 [name] ([id number]) [id number] Page 6 of 9 -------------------------------------------------------------------------------- Multi-Disciplinary Care Plan Details Patient Name: Date of  Service: [name], [name] 06/28/2023 10:00 A M Medical Record Number: [id number] Patient Account Number: [id number] Date of Birth/Sex: Treating RN: June 01, 1941 (82 y.o. [name] Primary Care Dariyah Garduno: Duncan Dull Other Clinician: Referring Nollie Shiflett: Treating Donasia Wimes/Extender: Dewayne Hatch Weeks in Treatment: 0 Active Inactive Wound/Skin Impairment Nursing Diagnoses: Impaired tissue integrity Knowledge deficit related to ulceration/compromised skin integrity Goals: Ulcer/skin breakdown will have a volume reduction of 30% by week 4 Date Initiated: 06/28/2023 Target Resolution Date: 08/02/2023 Goal Status: Active Ulcer/skin breakdown will have a volume reduction of 50% by week 8 Date Initiated: 06/28/2023 Target Resolution Date: 08/23/2023 Goal Status: Active Ulcer/skin breakdown will have a volume reduction of 80% by week 12 Date Initiated: 06/28/2023 Target Resolution Date: 09/20/2023 Goal Status: Active Ulcer/skin breakdown will heal within 14 weeks Date Initiated: 06/28/2023 Target Resolution Date: 10/04/2023 Goal Status: Active Interventions: Assess patient/caregiver ability to obtain necessary supplies Assess patient/caregiver ability to perform ulcer/skin care regimen upon admission and as needed Assess ulceration(s) every visit Provide education on ulcer and skin care Notes: Electronic Signature(s) Signed: 06/28/2023 12:02:04 PM By: Angelina Pih Entered By: Angelina Pih on 06/28/2023 09:02:04 -------------------------------------------------------------------------------- Pain Assessment Details Patient Name: Date of Service: Megan Cheeks B. 06/28/2023 10:00 A M Medical Record Number: [id number] Patient Account Number: [id number] Date of Birth/Sex: Treating RN: 1941/04/07 (82 y.o. [name] Bedias, Warren B ([id number]) [id number] Page 7 of 9 Primary Care Adolfo Granieri: Duncan Dull Other  Clinician: Referring Jory Welke: Treating Blondine Hottel/Extender: Doristine Devoid in Treatment: 0 Active Problems Location of Pain Severity and Description of Pain Patient Has Paino No Site Locations Rate the pain. Current Pain Level: 0 Pain Management and Medication Current Pain Management: Electronic Signature(s) Signed: 06/28/2023 1:21:28 PM By: Angelina Pih Entered By: Angelina Pih on 06/28/2023 07:24:04 -------------------------------------------------------------------------------- Patient/Caregiver Education Details Patient Name: Date of Service: Megan Fuchs 10/25/2024andnbsp10:00 A M Medical Record Number: [id number] Patient Account Number: [id number] Date of Birth/Gender: Treating RN: Oct 27, 1940 (82 y.o. [name] Primary Care Physician: Duncan Dull Other Clinician: Referring Physician: Treating Physician/Extender: Doristine Devoid in Treatment: 0 Education Assessment Education Provided To: Patient Education Topics Provided Welcome T [facility]New [facility] Packet: o Handouts: [facility] P[facility] form, Welcome T [facility] o Methods: Explain/Verbal Responses: State content correctly Wound/Skin Impairment: Handouts: Caring for Your Ulcer Methods: Explain/Verbal Responses: State content correctly BRITTANIE, SALEY B ([id number]) [id number] Page 8 of 9 Electronic Signature(s) Signed: 06/28/2023 1:21:28 PM By: Angelina Pih Entered By: Angelina Pih on 06/28/2023 09:02:22 -------------------------------------------------------------------------------- Wound Assessment Details Patient Name: Date of Service: Megan Salk RO N B. 06/28/2023 10:00 A

## 2023-07-02 ENCOUNTER — Ambulatory Visit: Payer: Medicare HMO | Admitting: Physician Assistant

## 2023-07-08 ENCOUNTER — Ambulatory Visit (INDEPENDENT_AMBULATORY_CARE_PROVIDER_SITE_OTHER): Payer: Medicare HMO

## 2023-07-08 ENCOUNTER — Ambulatory Visit: Payer: Medicare HMO | Admitting: Physician Assistant

## 2023-07-08 DIAGNOSIS — M810 Age-related osteoporosis without current pathological fracture: Secondary | ICD-10-CM

## 2023-07-08 MED ORDER — DENOSUMAB 60 MG/ML ~~LOC~~ SOSY
60.0000 mg | PREFILLED_SYRINGE | Freq: Once | SUBCUTANEOUS | Status: AC
  Administered 2023-07-08: 60 mg via SUBCUTANEOUS

## 2023-07-08 MED ORDER — DENOSUMAB 60 MG/ML ~~LOC~~ SOSY
60.0000 mg | PREFILLED_SYRINGE | SUBCUTANEOUS | Status: AC
  Administered 2024-01-13: 60 mg via SUBCUTANEOUS

## 2023-07-08 NOTE — Progress Notes (Signed)
Pt presented for their subcutaneous Prolia injection. Pt was identified through two identifiers. Pt was given the information packets about the Prolia and told to schedule their next injection 6 months out. Pt tolerated the subq injection well in the right arm.  

## 2023-07-09 ENCOUNTER — Encounter: Payer: Medicare HMO | Attending: Physician Assistant | Admitting: Physician Assistant

## 2023-07-09 DIAGNOSIS — E1151 Type 2 diabetes mellitus with diabetic peripheral angiopathy without gangrene: Secondary | ICD-10-CM | POA: Diagnosis not present

## 2023-07-09 DIAGNOSIS — L97812 Non-pressure chronic ulcer of other part of right lower leg with fat layer exposed: Secondary | ICD-10-CM | POA: Insufficient documentation

## 2023-07-09 DIAGNOSIS — E11622 Type 2 diabetes mellitus with other skin ulcer: Secondary | ICD-10-CM | POA: Insufficient documentation

## 2023-07-09 DIAGNOSIS — I10 Essential (primary) hypertension: Secondary | ICD-10-CM | POA: Insufficient documentation

## 2023-07-09 DIAGNOSIS — T81328A Disruption or dehiscence of closure of other specified internal operation (surgical) wound, initial encounter: Secondary | ICD-10-CM | POA: Diagnosis not present

## 2023-07-09 NOTE — Progress Notes (Signed)
Megan, [name] ([id number]) [id number] Page 1 of 6 Visit Report for 07/09/2023 Chief Complaint Document Details Patient Name: Date of Service: Megan, [name] 07/09/2023 9:15 A M Medical Record Number: [id number] Patient Account Number: [id number] Date of Birth/Sex: Treating RN: 17-Jan-1941 (82 y.o. Ginette Pitman Primary Care Provider: Duncan Dull Other Clinician: Referring Provider: Treating Provider/Extender: Doristine Devoid in Treatment: 1 Information Obtained from: Patient Chief Complaint Right LE Ulcer following biopsy Electronic Signature(s) Signed: 07/09/2023 9:14:34 AM By: Allen Derry PA-C Entered By: Allen Derry on 07/09/2023 09:14:34 -------------------------------------------------------------------------------- HPI Details Patient Name: Date of Service: Megan Cheeks B. 07/09/2023 9:15 A M Medical Record Number: [id number] Patient Account Number: [id number] Date of Birth/Sex: Treating RN: 04/11/1941 (82 y.o. Ginette Pitman Primary Care Provider: Duncan Dull Other Clinician: Referring Provider: Treating Provider/Extender: Doristine Devoid in Treatment: 1 History of Present Illness HPI Description: 06-28-2023 upon evaluation today patient presents for initial evaluation here in our clinic concerning issues that she has been having with a wound on the right posterior lower extremity. This is actually a biopsy site that was performed by dermatology. She has been using as they recommended Vaseline on the area and a Band-Aid but I think has been staying too wet therefore it has not healed appropriately. Fortunately I do not see any evidence of active infection locally or systemically which is great news. No fevers, chills, nausea, vomiting, or diarrhea. Patient's hemoglobin A1c on 06-10-2023 was 6.3 and does appear to be doing awesome. She does have diabetes mellitus type 2 and hypertension but no other major  medical problems. 07-09-2023 upon evaluation today patient appears to be doing well currently in regard to her wound. She has been tolerating the dressing changes without complication. Fortunately there does not appear to be any signs of active infection at this time. No fevers, chills, nausea, vomiting, or diarrhea. Electronic Signature(s) Signed: 07/09/2023 10:03:55 AM By: Allen Derry PA-C Entered By: Allen Derry on 07/09/2023 10:03:55 [name] ([id number]) [id number] Page 2 of 6 -------------------------------------------------------------------------------- Physical Exam Details Patient Name: Date of Service: Megan, [name] 07/09/2023 9:15 A M Medical Record Number: [id number] Patient Account Number: [id number] Date of Birth/Sex: Treating RN: July 24, 1941 (82 y.o. Ginette Pitman Primary Care Provider: Duncan Dull Other Clinician: Referring Provider: Treating Provider/Extender: Dewayne Hatch Weeks in Treatment: 1 Constitutional Well-nourished and well-hydrated in no acute distress. Respiratory normal breathing without difficulty. Psychiatric this patient is able to make decisions and demonstrates good insight into disease process. Alert and Oriented x 3. pleasant and cooperative. Notes Upon inspection patient's wound bed actually showed signs of good granulation and epithelization at this point. Fortunately I do not see any evidence of worsening overall I do believe that the patient is making good headway here towards closure. Electronic Signature(s) Signed: 07/09/2023 10:04:11 AM By: Allen Derry PA-C Entered By: Allen Derry on 07/09/2023 10:04:11 -------------------------------------------------------------------------------- Physician Orders Details Patient Name: Date of Service: Megan Cheeks B. 07/09/2023 9:15 A M Medical Record Number: [id number] Patient Account Number: [id number] Date of Birth/Sex: Treating RN: 11/08/1940 (82  y.o. Ginette Pitman Primary Care Provider: Duncan Dull Other Clinician: Referring Provider: Treating Provider/Extender: Doristine Devoid in Treatment: 1 The following information was scribed by: Midge Aver The information was scribed for: Allen Derry Verbal / Phone Orders: No Diagnosis Coding ICD-10 Coding Code Description T81.31XA Disruption of external operation (surgical) wound, not elsewhere classified, initial encounter L97.812 Non-pressure chronic  ulcer of other part of right lower leg with fat layer exposed E11.622 Type 2 diabetes mellitus with other skin ulcer I10 Essential (primary) hypertension [name], Megan B ([id number]) [id number] Page 3 of 6 Discharge From [facility] Services Discharge from Wound Care Center Treatment Complete - Apply A and D ointment to the dry area daily Follow-up Appointments Other: - Call if needed Electronic Signature(s) Signed: 07/09/2023 3:24:56 PM By: Allen Derry PA-C Signed: 07/09/2023 5:02:23 PM By: Midge Aver MSN RN CNS WTA Previous Signature: 07/09/2023 9:44:08 AM Version By: Midge Aver MSN RN CNS WTA Entered By: Midge Aver on 07/09/2023 10:03:31 -------------------------------------------------------------------------------- Problem List Details Patient Name: Date of Service: Megan Cheeks B. 07/09/2023 9:15 A M Medical Record Number: [id number] Patient Account Number: [id number] Date of Birth/Sex: Treating RN: 12-21-1940 (82 y.o. Ginette Pitman Primary Care Provider: Duncan Dull Other Clinician: Referring Provider: Treating Provider/Extender: Dewayne Hatch Weeks in Treatment: 1 Active Problems ICD-10 Encounter Code Description Active Date MDM Diagnosis T81.31XA Disruption of external operation (surgical) wound, not elsewhere classified, 06/28/2023 No Yes initial encounter L97.812 Non-pressure chronic ulcer of other part of right lower leg with fat layer 06/28/2023 No  Yes exposed E11.622 Type 2 diabetes mellitus with other skin ulcer 06/28/2023 No Yes I10 Essential (primary) hypertension 06/28/2023 No Yes Inactive Problems Resolved Problems Electronic Signature(s) Signed: 07/09/2023 9:14:29 AM By: Allen Derry PA-C Entered By: Allen Derry on 07/09/2023 09:14:29 [name] ([id number]) [id number] Page 4 of 6 -------------------------------------------------------------------------------- Progress Note Details Patient Name: Date of Service: TOYA, PALACIOS 07/09/2023 9:15 A M Medical Record Number: [id number] Patient Account Number: [id number] Date of Birth/Sex: Treating RN: 1941-04-22 (82 y.o. Ginette Pitman Primary Care Provider: Duncan Dull Other Clinician: Referring Provider: Treating Provider/Extender: Dewayne Hatch Weeks in Treatment: 1 Subjective Chief Complaint Information obtained from Patient Right LE Ulcer following biopsy History of Present Illness (HPI) 06-28-2023 upon evaluation today patient presents for initial evaluation here in our clinic concerning issues that she has been having with a wound on the right posterior lower extremity. This is actually a biopsy site that was performed by dermatology. She has been using as they recommended Vaseline on the area and a Band-Aid but I think has been staying too wet therefore it has not healed appropriately. Fortunately I do not see any evidence of active infection locally or systemically which is great news. No fevers, chills, nausea, vomiting, or diarrhea. Patient's hemoglobin A1c on 06-10-2023 was 6.3 and does appear to be doing awesome. She does have diabetes mellitus type 2 and hypertension but no other major medical problems. 07-09-2023 upon evaluation today patient appears to be doing well currently in regard to her wound. She has been tolerating the dressing changes without complication. Fortunately there does not appear to be any signs  of active infection at this time. No fevers, chills, nausea, vomiting, or diarrhea. Objective Constitutional Well-nourished and well-hydrated in no acute distress. Vitals Time Taken: 9:19 AM, Height: 63 in, Weight: 160 lbs, BMI: 28.3, Temperature: 98.1 F, Pulse: 53 bpm, Respiratory Rate: 16 breaths/min, Blood Pressure: 157/69 mmHg. Respiratory normal breathing without difficulty. Psychiatric this patient is able to make decisions and demonstrates good insight into disease process. Alert and Oriented x 3. pleasant and cooperative. General Notes: Upon inspection patient's wound bed actually showed signs of good granulation and epithelization at this point. Fortunately I do not see any evidence of worsening overall I do believe that the patient is making good headway here towards closure.  Integumentary (Hair, Skin) Wound #1 status is Healed - Epithelialized. Original cause of wound was Surgical Injury. The date acquired was: 05/13/2023. The wound has been in treatment 1 weeks. The wound is located on the Right,Posterior Lower Leg. The wound measures 0cm length x 0cm width x 0cm depth; 0cm^2 area and 0cm^3 volume. There is Fat Layer (Subcutaneous Tissue) exposed. There is a medium amount of serosanguineous drainage noted. There is medium (34-66%) red granulation within the wound bed. There is a medium (34-66%) amount of necrotic tissue within the wound bed. Assessment Active Problems ICD-10 Disruption of external operation (surgical) wound, not elsewhere classified, initial encounter Non-pressure chronic ulcer of other part of right lower leg with fat layer exposed Type 2 diabetes mellitus with other skin ulcer Whitworth, Thekla B ([id number]) [id number] Page 5 of 6 Essential (primary) hypertension Plan Discharge From [facility] Services: Discharge from Wound Care Center Treatment Complete - Apply A and D ointment to the dry area daily Follow-up Appointments: Other: - Call if  needed 1. I am going to recommend that we have the patient continue to monitor for any signs of infection or worsening. Based on what I am seeing I do believe that she is doing really well as far as the healing overall is concerned. Infection appears to be completely healed. 2. I am good recommend that we go and discontinue wound care services she should be using some AandD ointment over the area but otherwise no other major need for anything further from the dressing standpoint. We will see patient back for reevaluation in 1 week here in the clinic. If anything worsens or changes patient will contact our office for additional recommendations. Electronic Signature(s) Signed: 07/09/2023 10:04:50 AM By: Allen Derry PA-C Entered By: Allen Derry on 07/09/2023 10:04:50 -------------------------------------------------------------------------------- SuperBill Details Patient Name: Date of Service: Christiana Fuchs 07/09/2023 Medical Record Number: [id number] Patient Account Number: [id number] Date of Birth/Sex: Treating RN: 1941/04/29 (82 y.o. Ginette Pitman Primary Care Provider: Duncan Dull Other Clinician: Referring Provider: Treating Provider/Extender: Dewayne Hatch Weeks in Treatment: 1 Diagnosis Coding ICD-10 Codes Code Description T81.31XA Disruption of external operation (surgical) wound, not elsewhere classified, initial encounter L97.812 Non-pressure chronic ulcer of other part of right lower leg with fat layer exposed E11.622 Type 2 diabetes mellitus with other skin ulcer I10 Essential (primary) hypertension Facility Procedures : CPT4 Code: [id number] Description: 99213 - WOUND CARE VISIT-LEV 3 EST PT Modifier: Quantity: 1 Physician Procedures : CPT4 Code Description Modifier [id number] 99213 - WC PHYS LEVEL 3 - EST PT ICD-10 Diagnosis Description T81.31XA Disruption of external operation (surgical) wound, not elsewhere classified, initial encounter L97.812  Non-pressure chronic ulcer of other  part of right lower leg with fat layer exposed E11.622 Type 2 diabetes mellitus with other skin ulcer I10 Essential (primary) hypertension Arenz, Rianne B ([id number]) [id number] Page 6 of Quantity: 1 6 Electronic Signature(s) Signed: 07/09/2023 10:05:23 AM By: Allen Derry PA-C Entered By: Allen Derry on 07/09/2023 10:05:23

## 2023-07-09 NOTE — Progress Notes (Signed)
IRA, BUSBIN [name] ([id number]) [id number] Page 1 of 8 Visit Report for 07/09/2023 Arrival Information Details Patient Name: Date of Service: [name], [name] 07/09/2023 9:15 A M Medical Record Number: [id number] Patient Account Number: [id number] Date of Birth/Sex: Treating RN: 1941-02-09 (82 y.o. Ginette Pitman Primary Care Sang Blount: Duncan Dull Other Clinician: Referring Morgana Rowley: Treating Hammond Obeirne/Extender: Doristine Devoid in Treatment: 1 Visit Information History Since Last Visit Added or deleted any medications: No Patient Arrived: Ambulatory Any new allergies or adverse reactions: No Arrival Time: 09:17 Has Dressing in Place as Prescribed: Yes Accompanied By: self Pain Present Now: No Transfer Assistance: None Patient Identification Verified: Yes Secondary Verification Process Completed: Yes Patient Requires Transmission-Based Precautions: No Patient Has Alerts: No Electronic Signature(s) Signed: 07/09/2023 9:42:58 AM By: Midge Aver MSN RN CNS WTA Entered By: Midge Aver on 07/09/2023 09:42:58 -------------------------------------------------------------------------------- Clinic Level of Care Assessment Details Patient Name: Date of Service: [name], [name]. 07/09/2023 9:15 A M Medical Record Number: [id number] Patient Account Number: [id number] Date of Birth/Sex: Treating RN: 1940-11-21 (82 y.o. Ginette Pitman Primary Care Caetano Oberhaus: Duncan Dull Other Clinician: Referring Euclide Granito: Treating Loza Prell/Extender: Doristine Devoid in Treatment: 1 Clinic Level of Care Assessment Items TOOL 4 Quantity Score X- 1 0 Use when only an EandM is performed on FOLLOW-UP visit ASSESSMENTS - Nursing Assessment / Reassessment X- 1 10 Reassessment of Co-morbidities (includes updates in patient status) X- 1 5 Reassessment of Adherence to Treatment Plan ASSESSMENTS - Wound and Skin A ssessment / Reassessment X - Simple  Wound Assessment / Reassessment - one wound 1 5 []  - 0 Complex Wound Assessment / Reassessment - multiple wounds [name], [name] ([id number]) [id number] Page 2 of 8 []  - 0 Dermatologic / Skin Assessment (not related to wound area) ASSESSMENTS - Focused Assessment []  - 0 Circumferential Edema Measurements - multi extremities []  - 0 Nutritional Assessment / Counseling / Intervention []  - 0 Lower Extremity Assessment (monofilament, tuning fork, pulses) []  - 0 Peripheral Arterial Disease Assessment (using hand held doppler) ASSESSMENTS - Ostomy and/or Continence Assessment and Care []  - 0 Incontinence Assessment and Management []  - 0 Ostomy Care Assessment and Management (repouching, etc.) PROCESS - Coordination of Care X - Simple Patient / Family Education for ongoing care 1 15 []  - 0 Complex (extensive) Patient / Family Education for ongoing care X- 1 10 Staff obtains Chiropractor, Records, T Results / Process Orders est []  - 0 Staff telephones HHA, Nursing Homes / Clarify orders / etc []  - 0 Routine Transfer to another Facility (non-emergent condition) []  - 0 Routine Hospital Admission (non-emergent condition) []  - 0 New Admissions / Manufacturing engineer / Ordering NPWT Apligraf, etc. , []  - 0 Emergency Hospital Admission (emergent condition) X- 1 10 Simple Discharge Coordination []  - 0 Complex (extensive) Discharge Coordination PROCESS - Special Needs []  - 0 Pediatric / Minor Patient Management []  - 0 Isolation Patient Management []  - 0 Hearing / Language / Visual special needs []  - 0 Assessment of Community assistance (transportation, D/C planning, etc.) []  - 0 Additional assistance / Altered mentation []  - 0 Support Surface(s) Assessment (bed, cushion, seat, etc.) INTERVENTIONS - Wound Cleansing / Measurement X - Simple Wound Cleansing - one wound 1 5 []  - 0 Complex Wound Cleansing - multiple wounds X- 1 5 Wound Imaging  (photographs - any number of wounds) []  - 0 Wound Tracing (instead of photographs) X- 1 5 Simple Wound Measurement - one wound []  - 0  Complex Wound Measurement - multiple wounds INTERVENTIONS - Wound Dressings X - Small Wound Dressing one or multiple wounds 1 10 []  - 0 Medium Wound Dressing one or multiple wounds []  - 0 Large Wound Dressing one or multiple wounds []  - 0 Application of Medications - topical []  - 0 Application of Medications - injection INTERVENTIONS - Miscellaneous []  - 0 External ear exam []  - 0 Specimen Collection (cultures, biopsies, blood, body fluids, etc.) []  - 0 Specimen(s) / Culture(s) sent or taken to Lab for analysis [name], [name] ([id number]) [id number] Page 3 of 8 []  - 0 Patient Transfer (multiple staff / Nurse, adult / Similar devices) []  - 0 Simple Staple / Suture removal (25 or less) []  - 0 Complex Staple / Suture removal (26 or more) []  - 0 Hypo / Hyperglycemic Management (close monitor of Blood Glucose) []  - 0 Ankle / Brachial Index (ABI) - do not check if billed separately X- 1 5 Vital Signs Has the patient been seen at the hospital within the last three years: Yes Total Score: 85 Level Of Care: New/Established - Level 3 Electronic Signature(s) Signed: 07/09/2023 5:02:23 PM By: Midge Aver MSN RN CNS WTA Entered By: Midge Aver on 07/09/2023 10:03:46 -------------------------------------------------------------------------------- Encounter Discharge Information Details Patient Name: Date of Service: Megan Cheeks [name]. 07/09/2023 9:15 A M Medical Record Number: [id number] Patient Account Number: [id number] Date of Birth/Sex: Treating RN: 29-Sep-1940 (82 y.o. Ginette Pitman Primary Care Jaedyn Marrufo: Duncan Dull Other Clinician: Referring Effa Yarrow: Treating Raheen Capili/Extender: Doristine Devoid in Treatment: 1 Encounter Discharge Information Items Discharge Condition: Stable Ambulatory Status:  Ambulatory Discharge Destination: Home Transportation: Private Auto Accompanied By: self Schedule Follow-up Appointment: No Clinical Summary of Care: Electronic Signature(s) Signed: 07/09/2023 5:02:23 PM By: Midge Aver MSN RN CNS WTA Entered By: Midge Aver on 07/09/2023 10:04:51 -------------------------------------------------------------------------------- Lower Extremity Assessment Details Patient Name: Date of Service: Megan Cheeks [name]. 07/09/2023 9:15 A M Medical Record Number: [id number] Patient Account Number: [id number] Date of Birth/Sex: Treating RN: 1941/06/08 (82 y.o. Ginette Pitman Primary Care Henchy Mccauley: Duncan Dull Other Clinician: Referring Darcey Cardy: Treating Jaasiel Hollyfield/Extender: Dewayne Hatch Alderson, Guinevere Scarlet ([id number]) [id number] Page 4 of 8 Weeks in Treatment: 1 Edema Assessment Assessed: [Left: No] [Right: Yes] Edema: [Left: N] [Right: o] Calf Left: Right: Point of Measurement: From Medial Instep 38 cm Ankle Left: Right: Point of Measurement: From Medial Instep 20.8 cm Vascular Assessment Pulses: Dorsalis Pedis Palpable: [Right:Yes] Extremity colors, hair growth, and conditions: Extremity Color: [Right:Normal] Hair Growth on Extremity: [Right:Yes] Temperature of Extremity: [Right:Warm] Capillary Refill: [Right:< 3 seconds] Dependent Rubor: [Right:No] Blanched when Elevated: [Right:No No] Toe Nail Assessment Left: Right: Thick: No Discolored: No Deformed: No Improper Length and Hygiene: No Electronic Signature(s) Signed: 07/09/2023 5:02:23 PM By: Midge Aver MSN RN CNS WTA Previous Signature: 07/09/2023 9:43:18 AM Version By: Midge Aver MSN RN CNS WTA Entered By: Midge Aver on 07/09/2023 09:54:07 -------------------------------------------------------------------------------- Multi Wound Chart Details Patient Name: Date of Service: Megan Cheeks [name]. 07/09/2023 9:15 A M Medical Record Number:  [id number] Patient Account Number: [id number] Date of Birth/Sex: Treating RN: May 23, 1941 (82 y.o. Ginette Pitman Primary Care Kaiea Esselman: Duncan Dull Other Clinician: Referring Nysa Sarin: Treating Carinne Brandenburger/Extender: Dewayne Hatch Weeks in Treatment: 1 Vital Signs Height(in): 63 Pulse(bpm): 53 Weight(lbs): 160 Blood Pressure(mmHg): 157/69 Body Mass Index(BMI): 28.3 Temperature(F): 98.1 Respiratory Rate(breaths/min): 16 [name], [name] ([id number]) [1:Photos:] [N/A:N/A] Right, Posterior Lower Leg N/A N/A Wound Location: Surgical Injury N/A N/A Wounding Event:  Open Surgical Wound N/A N/A Primary Etiology: Cataracts, Hypertension, Type II N/A N/A Comorbid History: Diabetes 05/13/2023 N/A N/A Date Acquired: 1 N/A N/A Weeks of Treatment: Healed - Epithelialized N/A N/A Wound Status: No N/A N/A Wound Recurrence: 0x0x0 N/A N/A Measurements L x W x D (cm) 0 N/A N/A A (cm) : rea 0 N/A N/A Volume (cm) : 0.00% N/A N/A % Reduction in Area: 0.00% N/A N/A % Reduction in Volume: Full Thickness Without Exposed N/A N/A Classification: Support Structures Medium N/A N/A Exudate A mount: Serosanguineous N/A N/A Exudate Type: red, brown N/A N/A Exudate Color: Medium (34-66%) N/A N/A Granulation A mount: Red N/A N/A Granulation Quality: Medium (34-66%) N/A N/A Necrotic A mount: Fat Layer (Subcutaneous Tissue): Yes N/A N/A Exposed Structures: None N/A N/A Epithelialization: Treatment Notes Electronic Signature(s) Signed: 07/09/2023 5:02:23 PM By: Midge Aver MSN RN CNS WTA Previous Signature: 07/09/2023 9:43:26 AM Version By: Midge Aver MSN RN CNS WTA Entered By: Midge Aver on 07/09/2023 09:54:13 -------------------------------------------------------------------------------- Multi-Disciplinary Care Plan Details Patient Name: Date of Service: Megan Salk RO N [name]. 07/09/2023 9:15 A M Medical Record Number: [id number] Patient Account Number: [id number] Date of  Birth/Sex: Treating RN: 04/10/1941 (82 y.o. Ginette Pitman Primary Care Bali Lyn: Duncan Dull Other Clinician: Referring Evi Mccomb: Treating Pattrick Bady/Extender: Dewayne Hatch Weeks in Treatment: 1 Active Inactive Electronic Signature(s) Signed: 07/09/2023 5:02:23 PM By: Midge Aver MSN RN CNS WTA Entered By: Midge Aver on 07/09/2023 10:04:03 [name] ([id number]) [id number] Page 6 of 8 -------------------------------------------------------------------------------- Pain Assessment Details Patient Name: Date of Service: [name], [name] 07/09/2023 9:15 A M Medical Record Number: [id number] Patient Account Number: [id number] Date of Birth/Sex: Treating RN: November 23, 1940 (82 y.o. Ginette Pitman Primary Care Tenasia Aull: Duncan Dull Other Clinician: Referring Marialice Newkirk: Treating Quianna Avery/Extender: Dewayne Hatch Weeks in Treatment: 1 Active Problems Location of Pain Severity and Description of Pain Patient Has Paino No Site Locations Pain Management and Medication Current Pain Management: Electronic Signature(s) Signed: 07/09/2023 9:43:06 AM By: Midge Aver MSN RN CNS WTA Entered By: Midge Aver on 07/09/2023 09:43:06 -------------------------------------------------------------------------------- Patient/Caregiver Education Details Patient Name: Date of Service: [name] 11/5/2024andnbsp9:15 A M Medical Record Number: [id number] Patient Account Number: [id number] Date of Birth/Gender: Treating RN: 10-26-1940 (82 y.o. Ginette Pitman Primary Care Physician: Duncan Dull Other Clinician: Referring Physician: Treating Physician/Extender: Doristine Devoid in Treatment: 1 Kimberl, Vig Eminence [name] ([id number]) [id number] Page 7 of 8 Education Assessment Education Provided To: Patient Education Topics Provided Discharge Packet: Methods: Explain/Verbal Responses: State content  correctly Electronic Signature(s) Signed: 07/09/2023 5:02:23 PM By: Midge Aver MSN RN CNS WTA Entered By: Midge Aver on 07/09/2023 10:04:19 -------------------------------------------------------------------------------- Wound Assessment Details Patient Name: Date of Service: Megan Cheeks [name]. 07/09/2023 9:15 A M Medical Record Number: [id number] Patient Account Number: [id number] Date of Birth/Sex: Treating RN: 05-13-1941 (82 y.o. Ginette Pitman Primary Care Serah Nicoletti: Duncan Dull Other Clinician: Referring Havana Baldwin: Treating Lacoya Wilbanks/Extender: Dewayne Hatch Weeks in Treatment: 1 Wound Status Wound Number: 1 Primary Etiology: Open Surgical Wound Wound Location: Right, Posterior Lower Leg Wound Status: Healed - Epithelialized Wounding Event: Surgical Injury Comorbid History: Cataracts, Hypertension, Type II Diabetes Date Acquired: 05/13/2023 Weeks Of Treatment: 1 Clustered Wound: No Photos Wound Measurements Length: (cm) Width: (cm) Depth: (cm) Area: (cm) Volume: (cm) 0 % Reduction in Area: 0% 0 % Reduction in Volume: 0% 0 Epithelialization: None 0 0 Wound Description Classification: Full Thickness Without Exposed Support Structures Exudate Amount: Medium Exudate  Type: Serosanguineous Exudate Color: red, brown [name], [name] ([id number]) Foul Odor After Cleansing: No Slough/Fibrino Yes [id number] Page 8 of 8 Wound Bed Granulation Amount: Medium (34-66%) Exposed Structure Granulation Quality: Red Fat Layer (Subcutaneous Tissue) Exposed: Yes Necrotic Amount: Medium (34-66%) Treatment Notes Wound #1 (Lower Leg) Wound Laterality: Right, Posterior Cleanser Peri-Wound Care Topical Primary Dressing Secondary Dressing Secured With Compression Wrap Compression Stockings Add-Ons Electronic Signature(s) Signed: 07/09/2023 5:02:23 PM By: Midge Aver MSN RN CNS WTA Entered By: Midge Aver on 07/09/2023  09:53:57 -------------------------------------------------------------------------------- Vitals Details Patient Name: Date of Service: Megan Cheeks [name]. 07/09/2023 9:15 A M Medical Record Number: [id number] Patient Account Number: [id number] Date of Birth/Sex: Treating RN: 06-Oct-1940 (82 y.o. Ginette Pitman Primary Care Mieka Leaton: Duncan Dull Other Clinician: Referring Latifa Noble: Treating Syann Cupples/Extender: Dewayne Hatch Weeks in Treatment: 1 Vital Signs Time Taken: 09:19 Temperature (F): 98.1 Height (in): 63 Pulse (bpm): 53 Weight (lbs): 160 Respiratory Rate (breaths/min): 16 Body Mass Index (BMI): 28.3 Blood Pressure (mmHg): 157/69 Reference Range: 80 - 120 mg / dl Electronic Signature(s) Signed: 07/09/2023 9:43:02 AM By: Midge Aver MSN RN CNS WTA Entered By: Midge Aver on 07/09/2023 09:43:02

## 2023-07-26 ENCOUNTER — Other Ambulatory Visit: Payer: Self-pay | Admitting: Internal Medicine

## 2023-07-29 ENCOUNTER — Telehealth: Payer: Self-pay | Admitting: Internal Medicine

## 2023-07-29 MED ORDER — "SYRINGE 25G X 1"" 3 ML MISC": 0 refills | Status: AC

## 2023-07-29 NOTE — Telephone Encounter (Signed)
Prescription Request  07/29/2023  LOV: 06/10/2023  What is the name of the medication or equipment?  Syringe/Needle, Disp, (SYRINGE 3CC/25GX1") 25G X 1" 3 ML MISC  Patient is out   Have you contacted your pharmacy to request a refill? Yes   Which pharmacy would you like this sent to?  [id number] [place],  - [address] [place] [address] Phone: [phone] Fax: [phone]    Patient notified that their request is being sent to the clinical staff for review and that they should receive a response within 2 business days.   Please advise at [facility] [phone]

## 2023-07-29 NOTE — Telephone Encounter (Signed)
Prescription sent to pharmacy.

## 2023-07-29 NOTE — Addendum Note (Signed)
Addended by: Sandy Salaam on: 07/29/2023 04:11 PM   Modules accepted: Orders

## 2023-09-02 ENCOUNTER — Ambulatory Visit
Admission: RE | Admit: 2023-09-02 | Discharge: 2023-09-02 | Disposition: A | Discharge status: Home / Self Care | Payer: Medicare HMO | Source: Ambulatory Visit | Attending: Internal Medicine | Admitting: Internal Medicine

## 2023-09-02 DIAGNOSIS — Z1231 Encounter for screening mammogram for malignant neoplasm of breast: Secondary | ICD-10-CM | POA: Diagnosis present

## 2023-09-24 ENCOUNTER — Other Ambulatory Visit: Payer: Self-pay | Admitting: Internal Medicine

## 2023-09-27 ENCOUNTER — Ambulatory Visit: Payer: Self-pay | Admitting: Internal Medicine

## 2023-09-27 NOTE — Telephone Encounter (Signed)
FYI for PCP  Spoke with pt's granddaughter, Lillia Abed and advised her that with the pt having behavior changes that it could be that pt possibly has a UTI. I advised granddaughter that if her symptoms worsen over the weekend that they will wont to go ahead and take her on to UC to be evaluated. Lillia Abed gave a verbal understanding.

## 2023-09-27 NOTE — Telephone Encounter (Signed)
Chief Complaint: Change in behavior, possible depression Symptoms: increased irritability/anger/tearfulness, insomnia, increased appetite Frequency: Recently noticed an increase in changed behavior Pertinent Negatives: Patient denies SI/HI, fever, SOB Disposition: [] ED /[] Urgent Care (no appt availability in office) / [x] Appointment(In office/virtual)/ []  [facility]/ [] Home Care/ [] Refused Recommended Disposition /[] [facility]/ []  Follow-up with PCP Additional Notes: Pt's granddaughter Mardella Layman reports family has noticed an increased change in pt's behavior recently. She notes pt has been increasingly angry and irritable, doesn't like the way she looks in her clothes and states "all I do is sit around and eat all time", is not sleeping and often awaking in the middle of the night, decreased socialization. Denies SI/HI. Grddtr reports pt does have an "irritable gut" but they contributed this to pt's diabetes/medications and poor diet. OV scheduled for Monday AM. This RN educated pt on home care, new-worsening symptoms, when to call back/seek emergent care. Pt verbalized understanding and agrees to plan.    Copied from CRM [id number]. Topic: Clinical - Red Word Triage >> Sep 27, 2023  3:07 PM Desma Mcgregor wrote: Red Word that prompted transfer to Nurse Triage: Patient showing symptoms of depression. Expressing anger, Eating all day, upset about not fitting her clothes, definitely struggling with it. Started crying at lunch today. Family feels it may be dementia. No short term memory, but long term memory is there. Would like some help/appt to figure out what is happening with the patient. Lindsey on the line crying about it. Reason for Disposition  [1] Depression AND [2] worsening (e.g., sleeping poorly, less able to do activities of daily living)  Answer Assessment - Initial Assessment Questions 1. CONCERN: "What happened that made you call today?"     Angry all the time states "I  don't know why", increased appetite, eating all day per pt, doesn't like the way clothes are fitting, doesn't want to leave the house often, decreased short term memory 2. DEPRESSION SYMPTOM SCREENING: "How are you feeling overall?" (e.g., decreased energy, increased sleeping or difficulty sleeping, difficulty concentrating, feelings of sadness, guilt, hopelessness, or worthlessness)     Feelings of anger, not sleeping, feelings of sadness 3. RISK OF HARM - SUICIDAL IDEATION:  "Do you ever have thoughts of hurting or killing yourself?"  (e.g., yes, no, no but preoccupation with thoughts about death)   - INTENT:  "Do you have thoughts of hurting or killing yourself right NOW?" (e.g., yes, no, N/A)   - PLAN: "Do you have a specific plan for how you would do this?" (e.g., gun, knife, overdose, no plan, N/A)     None 4. RISK OF HARM - HOMICIDAL IDEATION:  "Do you ever have thoughts of hurting or killing someone else?"  (e.g., yes, no, no but preoccupation with thoughts about death)   - INTENT:  "Do you have thoughts of hurting or killing someone right NOW?" (e.g., yes, no, N/A)   - PLAN: "Do you have a specific plan for how you would do this?" (e.g., gun, knife, no plan, N/A)      None 5. FUNCTIONAL IMPAIRMENT: "How have things been going for you overall? Have you had more difficulty than usual doing your normal daily activities?"  (e.g., better, same, worse; self-care, school, work, interactions)     About the same with hygiene, no concern. Chronic pain may be a contributor 6. SUPPORT: "Who is with you now?" "Who do you live with?" "Do you have family or friends who you can talk to?"  Family support 8. STRESSORS: "Has there been any new stress or recent changes in your life?"     Pt has chronic pain 9. ALCOHOL USE OR SUBSTANCE USE (DRUG USE): "Do you drink alcohol or use any illegal drugs?"     None 10. OTHER: "Do you have any other physical symptoms right now?" (e.g., fever)       Increased GI  issues, pt's grddtr states she is diabetic and doesn't eat well, intermittent diarrhea  Protocols used: Depression-A-AH

## 2023-09-29 NOTE — Telephone Encounter (Signed)
I am happy to see her, however not sure if this is appropriate for me to see as I am not familiar with her normal behavior and or presentation to consider dementia vs depression? Dr. Darrick Huntsman, your thoughts? Of course I can make sure it isn't an acute physical concern as time line on call is not documented.

## 2023-09-30 ENCOUNTER — Ambulatory Visit (INDEPENDENT_AMBULATORY_CARE_PROVIDER_SITE_OTHER): Payer: Medicare HMO | Admitting: Family

## 2023-09-30 ENCOUNTER — Telehealth: Payer: Self-pay | Admitting: Family

## 2023-09-30 ENCOUNTER — Encounter: Payer: Self-pay | Admitting: Family

## 2023-09-30 ENCOUNTER — Ambulatory Visit (INDEPENDENT_AMBULATORY_CARE_PROVIDER_SITE_OTHER): Payer: Medicare HMO

## 2023-09-30 VITALS — BP 156/72 | HR 64 | Temp 97.8°F | Ht 63.0 in | Wt 165.6 lb

## 2023-09-30 DIAGNOSIS — R451 Restlessness and agitation: Secondary | ICD-10-CM | POA: Insufficient documentation

## 2023-09-30 DIAGNOSIS — F418 Other specified anxiety disorders: Secondary | ICD-10-CM | POA: Diagnosis not present

## 2023-09-30 DIAGNOSIS — R410 Disorientation, unspecified: Secondary | ICD-10-CM | POA: Diagnosis not present

## 2023-09-30 DIAGNOSIS — R5383 Other fatigue: Secondary | ICD-10-CM

## 2023-09-30 DIAGNOSIS — Z7984 Long term (current) use of oral hypoglycemic drugs: Secondary | ICD-10-CM

## 2023-09-30 DIAGNOSIS — E118 Type 2 diabetes mellitus with unspecified complications: Secondary | ICD-10-CM

## 2023-09-30 DIAGNOSIS — D696 Thrombocytopenia, unspecified: Secondary | ICD-10-CM | POA: Diagnosis not present

## 2023-09-30 DIAGNOSIS — R351 Nocturia: Secondary | ICD-10-CM | POA: Diagnosis not present

## 2023-09-30 DIAGNOSIS — E034 Atrophy of thyroid (acquired): Secondary | ICD-10-CM

## 2023-09-30 LAB — BASIC METABOLIC PANEL WITH GFR
BUN: 29 mg/dL — ABNORMAL HIGH (ref 6–23)
CO2: 26 meq/L (ref 19–32)
Calcium: 9.7 mg/dL (ref 8.4–10.5)
Chloride: 107 meq/L (ref 96–112)
Creatinine, Ser: 0.69 mg/dL (ref 0.40–1.20)
GFR: 81.06 mL/min
Glucose, Bld: 97 mg/dL (ref 70–99)
Potassium: 4.9 meq/L (ref 3.5–5.1)
Sodium: 139 meq/L (ref 135–145)

## 2023-09-30 LAB — HEMOGLOBIN A1C: Hgb A1c MFr Bld: 6.4 % (ref 4.6–6.5)

## 2023-09-30 LAB — VITAMIN B12: Vitamin B-12: 546 pg/mL (ref 211–911)

## 2023-09-30 LAB — TSH: TSH: 2.96 u[IU]/mL (ref 0.35–5.50)

## 2023-09-30 MED ORDER — SERTRALINE HCL 25 MG PO TABS: 25.0000 mg | ORAL_TABLET | Freq: Every day | ORAL | 1 refills | Status: DC

## 2023-09-30 NOTE — Assessment & Plan Note (Addendum)
Very slight, has resolved for the most part however will assess for acute infection. Cbc, tsh, bmp today pending results.   Ddx thyroid disease, infection, uti, electrolyte abn MMSE completed and CN 1-12 assessed Low suspicion for  recent acute cva and or dementia

## 2023-09-30 NOTE — Telephone Encounter (Signed)
Are we able to add on A1c to todays labs?

## 2023-09-30 NOTE — Assessment & Plan Note (Signed)
Reviewed phq9 and gad 7  Start sertraline 25 mg once daily  Try to get out more out of the house

## 2023-09-30 NOTE — Assessment & Plan Note (Signed)
Suspect related to depression. Ruling out UTI however pt unable to leave sample currently, will bring it back when she can void and send for u/a with reflex culture

## 2023-09-30 NOTE — Patient Instructions (Signed)
 ------------------------------------    Start sertraline 25 mg anxiety and depression.   Taking the medicine as directed and not missing any doses is one of the best things you can do to treat your anxiety/depression.  Here are some things to keep in mind:  Side effects (stomach upset, some increased anxiety) may happen before you notice a benefit.  These side effects typically go away over time. Changes to your dose of medicine or a change in medication all together is sometimes necessary Many people will notice an improvement within two weeks but the full effect of the medication can take up to 4-6 weeks Stopping the medication when you start feeling better often results in a return of symptoms. Most people need to be on medication at least 6-12 months If you start having thoughts of hurting yourself or others after starting this medicine, please call me immediately.    ------------------------------------

## 2023-09-30 NOTE — Assessment & Plan Note (Signed)
Fatigue and slight confusion, now resolved, with brain fog. Will reassess thyroid level, tsh ordered.

## 2023-09-30 NOTE — Telephone Encounter (Signed)
Noted

## 2023-09-30 NOTE — Progress Notes (Signed)
Established Patient Office Visit  Subjective:   Patient ID: TUCKER STEEDLEY, female    DOB: 19-Mar-1941  Age: 83 y.o. MRN: [id number]  CC:  Chief Complaint  Patient presents with   Acute Visit    Patient of Dr. Darrick Huntsman at [facility]. Granddaughter states that pt is having issues with agitation, depression and behavior changes.     HPI: NAKAIYA BEDDOW is a 83 y.o. female presenting on 09/30/2023 for Acute Visit (Patient of Dr. Darrick Huntsman at [facility]. Granddaughter states that pt is having issues with agitation, depression and behavior changes. )   Granddaughter (GD) accompanying her grandma. C/o issues with increased agitation depression and behavioral changes. Last week GD went to lunch with her and she was tearful and agitated, worried she is depressed. She does have h/o anxiety. pt states that she doesn't like to go out in the wintertime because it's cold, she does go to church, but she is otherwise socially isolated. She also does not sleep well at night either. She wakes up around 3/4 times in the am in the middle of the night, this is more recent in the last few months. Does find she eats more at the house throughout the day because of boredom. Gd states pt is not wandering in middle of night or waking up confused. GD is on sertraline.   Pt does state at times can not remember someone's name and it will come to her later. Feeling agitated more often than not, grand daughter states that this has spiked in the last few weeks. She thinks that pt was 'better today' but she thinks its because she had a 'purpose today (coming into doctors office)  Denies dysuria, does have increased urinary frequency and slight urgency. No flank pain. No fever or chills. GD states that pt is typically Aox3 but in the last few weeks will sometimes 'look at her like a deer in headlights'.   Wt Readings from Last 3 Encounters:  09/30/23 165 lb 9.6 oz (75.1 kg)  06/10/23 162 lb 6.4 oz (73.7 kg)   04/22/23 157 lb 12.8 oz (71.6 kg)   Has only ever been RX xanax for anxiety never taken any other medications for depression or anxiety.      09/30/2023   11:00 AM 04/22/2023   11:02 AM  GAD 7 : Generalized Anxiety Score  Nervous, Anxious, on Edge 1 0  Control/stop worrying 3 0  Worry too much - different things 3 0  Trouble relaxing 2 0  Restless 0 0  Easily annoyed or irritable 2 0  Afraid - awful might happen 0 0  Total GAD 7 Score 11 0  Anxiety Difficulty Somewhat difficult Not difficult at all       09/30/2023   11:00 AM 06/10/2023   10:13 AM 04/22/2023   11:02 AM  PHQ9 SCORE ONLY  PHQ-9 Total Score 9 0 0        ROS: Negative unless specifically indicated above in HPI.   Relevant past medical history reviewed and updated as indicated.   Allergies and medications reviewed and updated.   Current Outpatient Medications:    ALPRAZolam (XANAX) 0.25 MG tablet, Take 1 tablet (0.25 mg total) by mouth 2 (two) times daily as needed for anxiety., Disp: 20 tablet, Rfl: 0   aspirin EC 81 MG tablet, Take 1 tablet (81 mg total) by mouth daily. Swallow whole., Disp: , Rfl:    atorvastatin (LIPITOR) 20 MG tablet, Take 1 tablet (20 mg  total) by mouth daily., Disp: 90 tablet, Rfl: 3   Blood Glucose Monitoring Suppl (ONE TOUCH ULTRA 2) w/Device KIT, Use to check blood sugars once daily., Disp: 1 kit, Rfl: 0   celecoxib (CELEBREX) 200 MG capsule, TAKE 1 CAPSULE BY MOUTH TWICE A DAY, Disp: 60 capsule, Rfl: 1   cholecalciferol (VITAMIN D) 25 MCG (1000 UNIT) tablet, Take 2 tablets (2,000 Units total) by mouth daily., Disp: 90 tablet, Rfl: 1   cyanocobalamin (VITAMIN B12) 1000 MCG/ML injection, INJECT 1 ML (1,000 MCG TOTAL) INTO THE MUSCLE EVERY 30 DAYS., Disp: 3 mL, Rfl: 3   glipiZIDE (GLUCOTROL) 5 MG tablet, TAKE 1/2 TABLET BY MOUTH AT BREAKFAST, AND 2 TABLETS AT DINNERTIME, Disp: 225 tablet, Rfl: 2   glucose blood (ONETOUCH ULTRA) test strip, Use to check blood sugars up to 3 times  daily., Disp: 100 strip, Rfl: 10   levothyroxine (SYNTHROID) 88 MCG tablet, TAKE 1 TABLET BY MOUTH EVERY DAY ON EMPTY STOMACH WITH WATER AT LEAST 30-60 MINS BEFORE BREAKFAST, Disp: 90 tablet, Rfl: 3   losartan (COZAAR) 50 MG tablet, TAKE 1 TABLET BY MOUTH EVERY DAY, Disp: 90 tablet, Rfl: 3   metFORMIN (GLUCOPHAGE-XR) 500 MG 24 hr tablet, TAKE 1 TABLET BY MOUTH EVERY DAY WITH BREAKFAST, Disp: 90 tablet, Rfl: 3   mometasone (ELOCON) 0.1 % ointment, Apply topically daily., Disp: 45 g, Rfl: 0   pioglitazone (ACTOS) 15 MG tablet, Take 1 tablet (15 mg total) by mouth daily with supper., Disp: 90 tablet, Rfl: 1   sertraline (ZOLOFT) 25 MG tablet, Take 1 tablet (25 mg total) by mouth daily., Disp: 30 tablet, Rfl: 1   Syringe/Needle, Disp, (SYRINGE 3CC/25GX1") 25G X 1" 3 ML MISC, Use for b12 injections, Disp: 50 each, Rfl: 0   triamcinolone cream (KENALOG) 0.1 %, APPLY 1 APPLICATION TOPICALLY 2 (TWO) TIMES DAILY. FOR UP TO 14 DAYS., Disp: 60 g, Rfl: 0  Current Facility-Administered Medications:    [START ON 01/06/2024] denosumab (PROLIA) injection 60 mg, 60 mg, Subcutaneous, Q6 months, Darrick Huntsman, Mar Daring, MD  Allergies  Allergen Reactions   Latex Rash    IgE < 0.10 (WNL) on 10/05/2021   Nickel Rash    Objective:   BP (!) 156/72 (BP Location: Left Arm, Patient Position: Sitting, Cuff Size: Normal)   Pulse 64   Temp 97.8 F (36.6 C) (Temporal)   Ht 5\' 3"  (1.6 m)   Wt 165 lb 9.6 oz (75.1 kg)   SpO2 95%   BMI 29.33 kg/m    Physical Exam Constitutional:      General: She is not in acute distress.    Appearance: Normal appearance. She is normal weight. She is not ill-appearing, toxic-appearing or diaphoretic.  Cardiovascular:     Rate and Rhythm: Normal rate.  Pulmonary:     Effort: Pulmonary effort is normal.  Abdominal:     General: Abdomen is flat.     Tenderness: There is no abdominal tenderness. There is no right CVA tenderness or left CVA tenderness.  Neurological:     General: No focal  deficit present.     Mental Status: She is alert and oriented to person, place, and time. Mental status is at baseline.     Cranial Nerves: Cranial nerves 2-12 are intact. No cranial nerve deficit or facial asymmetry.     Sensory: Sensation is intact.     Motor: Motor function is intact. No weakness.     Coordination: Coordination is intact.  Psychiatric:  Mood and Affect: Mood normal.        Behavior: Behavior normal.        Thought Content: Thought content normal.        Judgment: Judgment normal.   mini-mental status exam    09/30/2023   11:34 AM 04/15/2018    9:13 AM 04/10/2017   11:31 AM  MMSE - Mini Mental State Exam  Orientation to time 5 5 5   Orientation to Place 5 5 5   Registration 3 3 3   Attention/ Calculation 2 5 5   Recall 2 3 3   Language- name 2 objects 2 2 2   Language- repeat 1 1 1   Language- follow 3 step command 3 3 3   Language- read & follow direction 1 1 1   Write a sentence 1 1 1   Copy design 1 1 1   Total score 26 30 30    Slight decline however still with normal cognitive range   Assessment & Plan:  Agitation Assessment & Plan: Suspect related to depression. Ruling out UTI however pt unable to leave sample currently, will bring it back when she can void and send for u/a with reflex culture   Episode of confusion Assessment & Plan: Very slight, has resolved for the most part however will assess for acute infection. Cbc, tsh, bmp today pending results.   Ddx thyroid disease, infection, uti, electrolyte abn MMSE completed and CN 1-12 assessed Low suspicion for  recent acute cva and or dementia   Orders: -     CBC with Differential/Platelet -     Basic metabolic panel -     Vitamin B12  Hypothyroidism due to acquired atrophy of thyroid Assessment & Plan: Fatigue and slight confusion, now resolved, with brain fog. Will reassess thyroid level, tsh ordered.    Orders: -     TSH  DM type 2, controlled, with complication (HCC) -     POCT  glycosylated hemoglobin (Hb A1C)  Nocturia -     Urinalysis w microscopic + reflex cultur; Future  Thrombocytopenia (HCC) -     CBC with Differential/Platelet  Other fatigue -     Vitamin B12  Depression with anxiety Assessment & Plan: Reviewed phq9 and gad 7  Start sertraline 25 mg once daily  Try to get out more out of the house    Orders: -     Sertraline HCl; Take 1 tablet (25 mg total) by mouth daily.  Dispense: 30 tablet; Refill: 1     Follow up plan: Return in about 4 weeks (around 10/28/2023) for Schedule follow up appt with pcp .  Mort Sawyers, FNP

## 2023-10-01 ENCOUNTER — Encounter: Payer: Self-pay | Admitting: Family

## 2023-10-01 ENCOUNTER — Other Ambulatory Visit: Payer: Self-pay | Admitting: Internal Medicine

## 2023-10-01 LAB — CBC WITH DIFFERENTIAL/PLATELET
Basophils Relative: 0 % (ref 0.0–3.0)
Eosinophils Relative: 3 % (ref 0.0–5.0)
HCT: 39.4 % (ref 36.0–46.0)
Hemoglobin: 13.1 g/dL (ref 12.0–15.0)
Lymphocytes Relative: 17 % (ref 12.0–46.0)
MCHC: 33.1 g/dL (ref 30.0–36.0)
MCV: 90.3 fl (ref 78.0–100.0)
Monocytes Relative: 12 % (ref 3.0–12.0)
Neutrophils Relative %: 68 % (ref 43.0–77.0)
Platelets: 163 10*3/uL (ref 150.0–400.0)
RBC: 4.37 Mil/uL (ref 3.87–5.11)
RDW: 14.2 % (ref 11.5–15.5)
WBC: 6.5 10*3/uL (ref 4.0–10.5)

## 2023-10-02 LAB — URINALYSIS W MICROSCOPIC + REFLEX CULTURE
Bacteria, UA: NONE SEEN /HPF
Bilirubin Urine: NEGATIVE
Glucose, UA: NEGATIVE
Hgb urine dipstick: NEGATIVE
Hyaline Cast: NONE SEEN /LPF
Ketones, ur: NEGATIVE
Nitrites, Initial: NEGATIVE
Protein, ur: NEGATIVE
RBC / HPF: NONE SEEN /HPF (ref 0–2)
Specific Gravity, Urine: 1.013 (ref 1.001–1.035)
pH: 5.5 (ref 5.0–8.0)

## 2023-10-02 LAB — URINE CULTURE
MICRO NUMBER:: 16005432
Result:: NO GROWTH
SPECIMEN QUALITY:: ADEQUATE

## 2023-10-02 LAB — CULTURE INDICATED

## 2023-10-22 ENCOUNTER — Other Ambulatory Visit: Payer: Self-pay | Admitting: Family

## 2023-10-22 DIAGNOSIS — F418 Other specified anxiety disorders: Secondary | ICD-10-CM

## 2023-10-25 DIAGNOSIS — E785 Hyperlipidemia, unspecified: Secondary | ICD-10-CM | POA: Diagnosis not present

## 2023-10-25 DIAGNOSIS — Z96651 Presence of right artificial knee joint: Secondary | ICD-10-CM | POA: Diagnosis not present

## 2023-10-25 DIAGNOSIS — E1169 Type 2 diabetes mellitus with other specified complication: Secondary | ICD-10-CM | POA: Diagnosis not present

## 2023-10-29 ENCOUNTER — Encounter: Payer: Self-pay | Admitting: Internal Medicine

## 2023-10-29 ENCOUNTER — Ambulatory Visit (INDEPENDENT_AMBULATORY_CARE_PROVIDER_SITE_OTHER): Payer: Medicare HMO | Admitting: Internal Medicine

## 2023-10-29 VITALS — BP 124/78 | HR 59 | Ht 63.0 in | Wt 164.0 lb

## 2023-10-29 DIAGNOSIS — H6123 Impacted cerumen, bilateral: Secondary | ICD-10-CM | POA: Diagnosis not present

## 2023-10-29 DIAGNOSIS — F418 Other specified anxiety disorders: Secondary | ICD-10-CM

## 2023-10-29 MED ORDER — ATORVASTATIN CALCIUM 20 MG PO TABS: 20.0000 mg | ORAL_TABLET | Freq: Every day | ORAL | 3 refills | Status: AC

## 2023-10-29 MED ORDER — PIOGLITAZONE HCL 15 MG PO TABS: 15.0000 mg | ORAL_TABLET | Freq: Every day | ORAL | 1 refills | Status: DC

## 2023-10-29 MED ORDER — GLIPIZIDE 5 MG PO TABS: ORAL_TABLET | ORAL | 2 refills | Status: AC

## 2023-10-29 MED ORDER — LOSARTAN POTASSIUM 50 MG PO TABS: 50.0000 mg | ORAL_TABLET | Freq: Every day | ORAL | 3 refills | Status: AC

## 2023-10-29 MED ORDER — SERTRALINE HCL 50 MG PO TABS: 50.0000 mg | ORAL_TABLET | Freq: Every day | ORAL | Status: DC

## 2023-10-29 MED ORDER — LEVOTHYROXINE SODIUM 88 MCG PO TABS: ORAL_TABLET | ORAL | 3 refills | Status: AC

## 2023-10-29 NOTE — Progress Notes (Signed)
 Subjective:  Patient ID: [name], female    DOB: September 06, 1940  Age: 83 y.o. MRN: [id number]  CC: The primary encounter diagnosis was Bilateral hearing loss due to cerumen impaction. A diagnosis of Depression with anxiety was also pertinent to this visit.   HPI JARED CAHN presents for  Chief Complaint  Patient presents with   Medical Management of Chronic Issues    Follow up on depression    Follow up on GAD/depression:  sertraline start by [facility]  provider 4 weeks ago after granddaughter rscheduled her an appt because patient was reportedly exhibiting  signs of depression(anxiety,  agitation,  social isolation)  .  Labs done to rule out UTI. MMSE done,    symptoms had also been  observed by her daughter but she and duagher have been in conflict for yeatrs.  .  Patient  states that "I am not depressed.  I am tired of winter but I don't like going out except to work in my yard and it's too cold " Does not get along with daughter who also live with her.  . Cerumen impaction   Outpatient Medications Prior to Visit  Medication Sig Dispense Refill   ALPRAZolam (XANAX) 0.25 MG tablet Take 1 tablet (0.25 mg total) by mouth 2 (two) times daily as needed for anxiety. 20 tablet 0   aspirin EC 81 MG tablet Take 1 tablet (81 mg total) by mouth daily. Swallow whole.     Blood Glucose Monitoring Suppl (ONE TOUCH ULTRA 2) w/Device KIT Use to check blood sugars once daily. 1 kit 0   celecoxib (CELEBREX) 200 MG capsule TAKE 1 CAPSULE BY MOUTH TWICE A DAY 60 capsule 1   cholecalciferol (VITAMIN D) 25 MCG (1000 UNIT) tablet Take 2 tablets (2,000 Units total) by mouth daily. 90 tablet 1   cyanocobalamin (VITAMIN B12) 1000 MCG/ML injection INJECT 1 ML (1,000 MCG TOTAL) INTO THE MUSCLE EVERY 30 DAYS. 3 mL 3   glucose blood (ONETOUCH ULTRA) test strip Use to check blood sugars up to 3 times daily. 100 strip 10   metFORMIN (GLUCOPHAGE-XR) 500 MG 24 hr tablet TAKE 1 TABLET BY MOUTH EVERY DAY WITH  BREAKFAST 90 tablet 3   mometasone (ELOCON) 0.1 % ointment Apply topically daily. 45 g 0   sertraline (ZOLOFT) 25 MG tablet TAKE 1 TABLET (25 MG TOTAL) BY MOUTH DAILY. 90 tablet 1   Syringe/Needle, Disp, (SYRINGE 3CC/25GX1") 25G X 1" 3 ML MISC Use for b12 injections 50 each 0   triamcinolone cream (KENALOG) 0.1 % APPLY 1 APPLICATION TOPICALLY 2 (TWO) TIMES DAILY. FOR UP TO 14 DAYS. 60 g 0   atorvastatin (LIPITOR) 20 MG tablet Take 1 tablet (20 mg total) by mouth daily. 90 tablet 3   glipiZIDE (GLUCOTROL) 5 MG tablet TAKE 1/2 TABLET BY MOUTH AT BREAKFAST, AND 2 TABLETS AT DINNERTIME 225 tablet 2   levothyroxine (SYNTHROID) 88 MCG tablet TAKE 1 TABLET BY MOUTH EVERY DAY ON EMPTY STOMACH WITH WATER AT LEAST 30-60 MINS BEFORE BREAKFAST 90 tablet 3   losartan (COZAAR) 50 MG tablet TAKE 1 TABLET BY MOUTH EVERY DAY 90 tablet 3   pioglitazone (ACTOS) 15 MG tablet Take 1 tablet (15 mg total) by mouth daily with supper. 90 tablet 1   Facility-Administered Medications Prior to Visit  Medication Dose Route Frequency Provider Last Rate Last Admin   [START ON 01/06/2024] denosumab (PROLIA) injection 60 mg  60 mg Subcutaneous Q6 months Sherlene Shams, MD  Review of Systems;  Patient denies headache, fevers, malaise, unintentional weight loss, skin rash, eye pain, sinus congestion and sinus pain, sore throat, dysphagia,  hemoptysis , cough, dyspnea, wheezing, chest pain, palpitations, orthopnea, edema, abdominal pain, nausea, melena, diarrhea, constipation, flank pain, dysuria, hematuria, urinary  Frequency, nocturia, numbness, tingling, seizures,  Focal weakness, Loss of consciousness,  Tremor, insomnia, depression, anxiety, and suicidal ideation.      Objective:  BP 124/78   Pulse (!) 59   Ht 5\' 3"  (1.6 m)   Wt 164 lb (74.4 kg)   SpO2 96%   BMI 29.05 kg/m   BP Readings from Last 3 Encounters:  10/29/23 124/78  09/30/23 (!) 156/72  06/10/23 128/62    Wt Readings from Last 3 Encounters:   10/29/23 164 lb (74.4 kg)  09/30/23 165 lb 9.6 oz (75.1 kg)  06/10/23 162 lb 6.4 oz (73.7 kg)    Physical Exam Vitals reviewed.  Constitutional:      General: She is not in acute distress.    Appearance: Normal appearance. She is normal weight. She is not ill-appearing, toxic-appearing or diaphoretic.  HENT:     Head: Normocephalic.     Right Ear: There is impacted cerumen.     Left Ear: There is impacted cerumen.  Eyes:     General: No scleral icterus.       Right eye: No discharge.        Left eye: No discharge.     Conjunctiva/sclera: Conjunctivae normal.  Cardiovascular:     Rate and Rhythm: Normal rate and regular rhythm.     Heart sounds: Normal heart sounds.  Pulmonary:     Effort: Pulmonary effort is normal. No respiratory distress.     Breath sounds: Normal breath sounds.  Musculoskeletal:        General: Normal range of motion.  Skin:    General: Skin is warm and dry.  Neurological:     General: No focal deficit present.     Mental Status: She is alert and oriented to person, place, and time. Mental status is at baseline.  Psychiatric:        Mood and Affect: Mood normal.        Behavior: Behavior normal.        Thought Content: Thought content normal.        Judgment: Judgment normal.    Lab Results  Component Value Date   HGBA1C 6.4 09/30/2023   HGBA1C 6.3 06/10/2023   HGBA1C 5.7 12/06/2022    Lab Results  Component Value Date   CREATININE 0.69 09/30/2023   CREATININE 0.88 06/12/2023   CREATININE 0.77 06/10/2023    Lab Results  Component Value Date   WBC 6.5 09/30/2023   HGB 13.1 09/30/2023   HCT 39.4 09/30/2023   PLT 163.0 09/30/2023   GLUCOSE 97 09/30/2023   CHOL 136 06/10/2023   TRIG 100.0 06/10/2023   HDL 66.60 06/10/2023   LDLDIRECT 59.0 06/10/2023   LDLCALC 49 06/10/2023   ALT 10 06/10/2023   AST 14 06/10/2023   NA 139 09/30/2023   K 4.9 09/30/2023   CL 107 09/30/2023   CREATININE 0.69 09/30/2023   BUN 29 (H) 09/30/2023   CO2  26 09/30/2023   TSH 2.96 09/30/2023   HGBA1C 6.4 09/30/2023   MICROALBUR 0.9 12/06/2022    MM 3D SCREENING MAMMOGRAM BILATERAL BREAST Result Date: 09/06/2023 CLINICAL DATA:  Screening. EXAM: DIGITAL SCREENING BILATERAL MAMMOGRAM WITH TOMOSYNTHESIS AND CAD TECHNIQUE: Bilateral screening digital craniocaudal  and mediolateral oblique mammograms were obtained. Bilateral screening digital breast tomosynthesis was performed. The images were evaluated with computer-aided detection. COMPARISON:  None available. ACR Breast Density Category a: The breasts are almost entirely fatty. FINDINGS: There are no findings suspicious for malignancy. IMPRESSION: No mammographic evidence of malignancy. A result letter of this screening mammogram will be mailed directly to the patient. RECOMMENDATION: Screening mammogram in one year. (Code:SM-B-01Y) BI-RADS CATEGORY  1: Negative. Electronically Signed   By: Baird Lyons M.D.   On: 09/06/2023 13:24    Assessment & Plan:  .Bilateral hearing loss due to cerumen impaction Assessment & Plan: Referring [facility] for cerumen disimpaction  Orders: -     Ambulatory referral to ENT  Depression with anxiety Assessment & Plan: Aggravated by winter's' restriction of her activities.  Increase sertraline to 50 mg daily    Other orders -     Atorvastatin Calcium; Take 1 tablet (20 mg total) by mouth daily.  Dispense: 90 tablet; Refill: 3 -     glipiZIDE; TAKE 1/2 TABLET BY MOUTH AT BREAKFAST, AND 2 TABLETS AT DINNERTIME  Dispense: 225 tablet; Refill: 2 -     Levothyroxine Sodium; TAKE 1 TABLET BY MOUTH EVERY DAY ON EMPTY STOMACH WITH WATER AT LEAST 30-60 MINS BEFORE BREAKFAST  Dispense: 90 tablet; Refill: 3 -     Losartan Potassium; Take 1 tablet (50 mg total) by mouth daily.  Dispense: 90 tablet; Refill: 3 -     Pioglitazone HCl; Take 1 tablet (15 mg total) by mouth daily with supper.  Dispense: 90 tablet; Refill: 1     Follow-up: No follow-ups on file.   Sherlene Shams, MD

## 2023-10-29 NOTE — Assessment & Plan Note (Signed)
 Aggravated by winter's' restriction of her activities.  Increase sertraline to 50 mg daily

## 2023-10-29 NOTE — Patient Instructions (Addendum)
 Increase your  sertraline  dose to 50 mg daily   with breakfast   I am referring you to [facility] because your ears are both BLOCKED

## 2023-10-29 NOTE — Assessment & Plan Note (Signed)
 Referring [facility] for cerumen disimpaction

## 2023-11-11 DIAGNOSIS — H6123 Impacted cerumen, bilateral: Secondary | ICD-10-CM | POA: Diagnosis not present

## 2023-11-11 DIAGNOSIS — H902 Conductive hearing loss, unspecified: Secondary | ICD-10-CM | POA: Diagnosis not present

## 2023-11-20 ENCOUNTER — Other Ambulatory Visit: Payer: Self-pay | Admitting: Internal Medicine

## 2023-12-09 ENCOUNTER — Encounter: Payer: Self-pay | Admitting: Internal Medicine

## 2023-12-09 ENCOUNTER — Ambulatory Visit (INDEPENDENT_AMBULATORY_CARE_PROVIDER_SITE_OTHER): Payer: Medicare HMO | Admitting: Internal Medicine

## 2023-12-09 VITALS — BP 114/62 | HR 65 | Ht 63.0 in | Wt 164.6 lb

## 2023-12-09 DIAGNOSIS — I700 Atherosclerosis of aorta: Secondary | ICD-10-CM | POA: Diagnosis not present

## 2023-12-09 DIAGNOSIS — D126 Benign neoplasm of colon, unspecified: Secondary | ICD-10-CM

## 2023-12-09 DIAGNOSIS — E118 Type 2 diabetes mellitus with unspecified complications: Secondary | ICD-10-CM

## 2023-12-09 DIAGNOSIS — Z7984 Long term (current) use of oral hypoglycemic drugs: Secondary | ICD-10-CM

## 2023-12-09 DIAGNOSIS — I10 Essential (primary) hypertension: Secondary | ICD-10-CM

## 2023-12-09 DIAGNOSIS — M5430 Sciatica, unspecified side: Secondary | ICD-10-CM

## 2023-12-09 DIAGNOSIS — E663 Overweight: Secondary | ICD-10-CM | POA: Diagnosis not present

## 2023-12-09 MED ORDER — SEMAGLUTIDE(0.25 OR 0.5MG/DOS) 2 MG/3ML ~~LOC~~ SOPN: 0.2500 mg | PEN_INJECTOR | SUBCUTANEOUS | 2 refills | Status: DC

## 2023-12-09 NOTE — Assessment & Plan Note (Signed)
 Aggravated by housework and yardwork .  [facility] managing   With PT planned prior to MRI. Continue celebrex 200 mg daily.  Adding tramadol

## 2023-12-09 NOTE — Assessment & Plan Note (Signed)
 With atherosclerosis and hypertension. A1c remains excellent on glipizide, actos and metformin . She has minimal microalbuminuria , tolerating losartan,  And tolerating atorvastatin  adding Ozempic  for weigth management   Lab Results  Component Value Date   HGBA1C 6.4 09/30/2023   Lab Results  Component Value Date   LABMICR See below: 01/29/2023   LABMICR See below: 01/26/2022   MICROALBUR 0.9 12/06/2022   MICROALBUR 5.6 (H) 06/06/2022

## 2023-12-09 NOTE — Assessment & Plan Note (Signed)
 I have advised her that the follow up is needed even at 83   ( it was due in 2022 and she deferred )

## 2023-12-09 NOTE — Assessment & Plan Note (Signed)
Patient has been unable to lose or maintain a healthy weight despite good effort. Encouraged to increase exercise involvement to include more intense aerobic activity for 30 minutes 5 days per week.  Screened for contraindications to use of  GLP 1 agonists for appetite suppression and she has none.  The risks and benefits of pharmacotherapy discussed and she is requesting a trial of therapy .  rx written.  °

## 2023-12-09 NOTE — Assessment & Plan Note (Signed)
 Reviewed findings of prior CT scan today..  Patient is tolerating high potency statin therapy

## 2023-12-09 NOTE — Patient Instructions (Addendum)
 For your back pain:  Do not use any otc meds (motrin, ibuprofen , aleve) with celebrex except  tylenol  You can  take up to 3000 mg of acetominophen (tylenol) every day safely  In divided doses   ( 2 x 650 = 1300  mg every 12 12 hours   or take 1000 mg every 8 hours    2)  You were due for follow up colonoscopy in 2022 per Dr Lemar Livings because the polyp that was partially removed was precancerous .  I 'm making  a referral to DR Locklear   3) I am  recommending adding Ozempic  to help you lose weight    ozempic is a medication that is taken as a weekly subcutaneous injection. It is not insulin.  It  causes your pancreas to increase its  own insulin secretion  And also slows down the emptying of your stomach,  So it decreases your appetite and helps you lose weight.  The dose for the first 4 weekly doses is 0.25 mg.  You may have mild nausea on the first or second day but this should resolve.  If not  ,  stop the medication.   As long as you are losing weight,  you can continue the dose you are on .  Only increase the dose to 0.5 mg after 4 weeks if your weight has plateaued.  Let me know when you need a refill and what dose you are taking.    4) Return in early May for your diabetes labs

## 2023-12-09 NOTE — Progress Notes (Signed)
 Subjective:  Patient ID: [name], female    DOB: 1941-08-03  Age: 83 y.o. MRN: [id number]  CC: The primary encounter diagnosis was Essential hypertension. Diagnoses of DM type 2, controlled, with complication (HCC), Tubular adenoma of colon, Sciatica, unspecified laterality, Overweight (BMI 25.0-29.9), and Aortic arch atherosclerosis (HCC) were also pertinent to this visit.   HPI [name] presents for  Chief Complaint  Patient presents with   Medical Management of Chronic Issues    6 month follow up    1) type 2 DM/OVERWEIGHT :    She  feels generally well,  is NOT walking  regularly for exercise due to back pain, but  wants to lose  weight. Checking  blood sugars less than once daily at variable times, usually only if she feels she may be having a hypoglycemic event. .  BS have been under 130 fasting and < 150 post prandially.  Denies any recent hypoglyemic events.  Taking   medications as directed: glipizide, metformin and Actos.. Following a carbohydrate modified diet 6 days per week. Denies numbness, burning and tingling of extremities. Appetite is good.    Eats egg for bfast with  biscuit,  lunch is a salad or sandwich (1/2)  .  No sweet tea.  Dinner is a protein and vegetables.  Does not snack at night,  goes to bed early .  Patient has been unable to lose or maintain a healthy weight despite good effort. Encouraged to increase exercise involvement to include more intense aerobic activity for 30 minutes 5 days per week.  Screened for contraindications to use of  GLP 1 agonists for appetite suppression and she has none.  The risks and benefits of pharmacotherapy discussed and she is requesting a trial of therapy .  rx written.     2) lumbar spinal stenosis : she has chronic back pain now radiating to both  knees,  relieved temporarily by celebrex taking once daily , feels it wear off . Aggravated by vacuuming,   raking and picking up sticks.  Not using tylenol  at all..  no longer  seeing [facility],  no surgery planned .  Last MRI Jan 2024 ,       Outpatient Medications Prior to Visit  Medication Sig Dispense Refill   ALPRAZolam (XANAX) 0.25 MG tablet Take 1 tablet (0.25 mg total) by mouth 2 (two) times daily as needed for anxiety. 20 tablet 0   aspirin EC 81 MG tablet Take 1 tablet (81 mg total) by mouth daily. Swallow whole.     atorvastatin (LIPITOR) 20 MG tablet Take 1 tablet (20 mg total) by mouth daily. 90 tablet 3   Blood Glucose Monitoring Suppl (ONE TOUCH ULTRA 2) w/Device KIT Use to check blood sugars once daily. 1 kit 0   celecoxib (CELEBREX) 200 MG capsule TAKE 1 CAPSULE BY MOUTH TWICE A DAY 60 capsule 1   cholecalciferol (VITAMIN D) 25 MCG (1000 UNIT) tablet Take 2 tablets (2,000 Units total) by mouth daily. 90 tablet 1   cyanocobalamin (VITAMIN B12) 1000 MCG/ML injection INJECT 1 ML (1,000 MCG TOTAL) INTO THE MUSCLE EVERY 30 DAYS. 3 mL 3   glipiZIDE (GLUCOTROL) 5 MG tablet TAKE 1/2 TABLET BY MOUTH AT BREAKFAST, AND 2 TABLETS AT DINNERTIME 225 tablet 2   glucose blood (ONETOUCH ULTRA) test strip Use to check blood sugars up to 3 times daily. 100 strip 10   levothyroxine (SYNTHROID) 88 MCG tablet TAKE 1 TABLET BY MOUTH EVERY DAY  ON EMPTY STOMACH WITH WATER AT LEAST 30-60 MINS BEFORE BREAKFAST 90 tablet 3   losartan (COZAAR) 50 MG tablet Take 1 tablet (50 mg total) by mouth daily. 90 tablet 3   metFORMIN (GLUCOPHAGE-XR) 500 MG 24 hr tablet TAKE 1 TABLET BY MOUTH EVERY DAY WITH BREAKFAST 90 tablet 3   pioglitazone (ACTOS) 15 MG tablet Take 1 tablet (15 mg total) by mouth daily with supper. 90 tablet 1   sertraline (ZOLOFT) 50 MG tablet Take 1 tablet (50 mg total) by mouth daily.     Syringe/Needle, Disp, (SYRINGE 3CC/25GX1") 25G X 1" 3 ML MISC Use for b12 injections 50 each 0   mometasone (ELOCON) 0.1 % ointment Apply topically daily. (Patient not taking: Reported on 12/09/2023) 45 g 0   triamcinolone cream (KENALOG) 0.1 % APPLY 1 APPLICATION TOPICALLY 2 (TWO) TIMES  DAILY. FOR UP TO 14 DAYS. (Patient not taking: Reported on 12/09/2023) 60 g 0   Facility-Administered Medications Prior to Visit  Medication Dose Route Frequency Provider Last Rate Last Admin   [START ON 01/06/2024] denosumab (PROLIA) injection 60 mg  60 mg Subcutaneous Q6 months Sherlene Shams, MD        Review of Systems;  Patient denies headache, fevers, malaise, unintentional weight loss, skin rash, eye pain, sinus congestion and sinus pain, sore throat, dysphagia,  hemoptysis , cough, dyspnea, wheezing, chest pain, palpitations, orthopnea, edema, abdominal pain, nausea, melena, diarrhea, constipation, flank pain, dysuria, hematuria, urinary  Frequency, nocturia, numbness, tingling, seizures,  Focal weakness, Loss of consciousness,  Tremor, insomnia, depression, anxiety, and suicidal ideation.      Objective:  BP 114/62   Pulse 65   Ht 5\' 3"  (1.6 m)   Wt 164 lb 9.6 oz (74.7 kg)   SpO2 97%   BMI 29.16 kg/m   BP Readings from Last 3 Encounters:  12/09/23 114/62  10/29/23 124/78  09/30/23 (!) 156/72    Wt Readings from Last 3 Encounters:  12/09/23 164 lb 9.6 oz (74.7 kg)  10/29/23 164 lb (74.4 kg)  09/30/23 165 lb 9.6 oz (75.1 kg)    Physical Exam Vitals reviewed.  Constitutional:      General: She is not in acute distress.    Appearance: Normal appearance. She is normal weight. She is not ill-appearing, toxic-appearing or diaphoretic.  HENT:     Head: Normocephalic.  Eyes:     General: No scleral icterus.       Right eye: No discharge.        Left eye: No discharge.     Conjunctiva/sclera: Conjunctivae normal.  Cardiovascular:     Rate and Rhythm: Normal rate and regular rhythm.     Heart sounds: Normal heart sounds.  Pulmonary:     Effort: Pulmonary effort is normal. No respiratory distress.     Breath sounds: Normal breath sounds.  Musculoskeletal:        General: Normal range of motion.  Skin:    General: Skin is warm and dry.  Neurological:     General: No  focal deficit present.     Mental Status: She is alert and oriented to person, place, and time. Mental status is at baseline.  Psychiatric:        Mood and Affect: Mood normal.        Behavior: Behavior normal.        Thought Content: Thought content normal.        Judgment: Judgment normal.    Lab Results  Component Value  Date   HGBA1C 6.4 09/30/2023   HGBA1C 6.3 06/10/2023   HGBA1C 5.7 12/06/2022    Lab Results  Component Value Date   CREATININE 0.69 09/30/2023   CREATININE 0.88 06/12/2023   CREATININE 0.77 06/10/2023    Lab Results  Component Value Date   WBC 6.5 09/30/2023   HGB 13.1 09/30/2023   HCT 39.4 09/30/2023   PLT 163.0 09/30/2023   GLUCOSE 97 09/30/2023   CHOL 136 06/10/2023   TRIG 100.0 06/10/2023   HDL 66.60 06/10/2023   LDLDIRECT 59.0 06/10/2023   LDLCALC 49 06/10/2023   ALT 10 06/10/2023   AST 14 06/10/2023   NA 139 09/30/2023   K 4.9 09/30/2023   CL 107 09/30/2023   CREATININE 0.69 09/30/2023   BUN 29 (H) 09/30/2023   CO2 26 09/30/2023   TSH 2.96 09/30/2023   HGBA1C 6.4 09/30/2023   MICROALBUR 0.9 12/06/2022    MM 3D SCREENING MAMMOGRAM BILATERAL BREAST Result Date: 09/06/2023 CLINICAL DATA:  Screening. EXAM: DIGITAL SCREENING BILATERAL MAMMOGRAM WITH TOMOSYNTHESIS AND CAD TECHNIQUE: Bilateral screening digital craniocaudal and mediolateral oblique mammograms were obtained. Bilateral screening digital breast tomosynthesis was performed. The images were evaluated with computer-aided detection. COMPARISON:  None available. ACR Breast Density Category a: The breasts are almost entirely fatty. FINDINGS: There are no findings suspicious for malignancy. IMPRESSION: No mammographic evidence of malignancy. A result letter of this screening mammogram will be mailed directly to the patient. RECOMMENDATION: Screening mammogram in one year. (Code:SM-B-01Y) BI-RADS CATEGORY  1: Negative. Electronically Signed   By: Baird Lyons M.D.   On: 09/06/2023 13:24     Assessment & Plan:  .Essential hypertension -     Microalbumin / creatinine urine ratio; Future -     Comprehensive metabolic panel with GFR; Future  DM type 2, controlled, with complication [facility] Assessment & Plan: With atherosclerosis and hypertension. A1c remains excellent on glipizide, actos and metformin . She has minimal microalbuminuria , tolerating losartan,  And tolerating atorvastatin  adding Ozempic  for weigth management   Lab Results  Component Value Date   HGBA1C 6.4 09/30/2023   Lab Results  Component Value Date   LABMICR See below: 01/29/2023   LABMICR See below: 01/26/2022   MICROALBUR 0.9 12/06/2022   MICROALBUR 5.6 (H) 06/06/2022       Orders: -     Microalbumin / creatinine urine ratio; Future -     Comprehensive metabolic panel with GFR; Future -     Hemoglobin A1c; Future -     Lipid Panel w/reflex Direct LDL; Future  Tubular adenoma of colon Assessment & Plan: I have advised her that the follow up is needed even at 83   ( it was due in 2022 and she deferred )   Sciatica, unspecified laterality Assessment & Plan: Aggravated by housework and yardwork .  [facility] managing   With PT planned prior to MRI. Continue celebrex 200 mg daily.  Adding tramadol    Overweight (BMI 25.0-29.9) Assessment & Plan: Patient has been unable to lose or maintain a healthy weight despite good effort. Encouraged to increase exercise involvement to include more intense aerobic activity for 30 minutes 5 days per week.  Screened for contraindications to use of  GLP 1 agonists for appetite suppression and she has none.  The risks and benefits of pharmacotherapy discussed and she is requesting a trial of therapy .  rx written.     Aortic arch atherosclerosis (HCC) Assessment & Plan: Reviewed findings of  prior CT scan today..  Patient is tolerating high potency statin therapy     Other orders -     Semaglutide(0.25 or 0.5MG /DOS); Inject 0.25 mg into the skin  once a week.  Dispense: 3 mL; Refill: 2     I spent 34 minutes on the day of this face to face encounter reviewing patient's   prior relevant surgical and non surgical procedures, recent  lbs and imaging studies, counseling on weight management,  reviewing the assessment and plan with patient, and post visit ordering and reviewing of  diagnostics and therapeutics with patient  .   Follow-up: Return in about 3 months (around 03/09/2024).   Sherlene Shams, MD

## 2023-12-25 ENCOUNTER — Telehealth: Payer: Self-pay

## 2023-12-25 ENCOUNTER — Other Ambulatory Visit (HOSPITAL_COMMUNITY): Payer: Self-pay

## 2023-12-25 ENCOUNTER — Telehealth: Payer: Self-pay | Admitting: *Deleted

## 2023-12-25 NOTE — Telephone Encounter (Signed)
 Prior Auth: APPROVED PA# [id number]  Expiration Date: 12/25/23-12/24/24

## 2023-12-25 NOTE — Telephone Encounter (Signed)
 [name]

## 2023-12-25 NOTE — Telephone Encounter (Signed)
 PA request has been Submitted. Will update referral once determination has been made.

## 2023-12-25 NOTE — Telephone Encounter (Signed)
 Pt ready for scheduling for PROLIA  on or after : 01/06/24  Option# 1: Buy/Bill (Office supplied medication)  Out-of-pocket cost due at time of clinic visit: $357  Number of injection/visits approved: 2  Primary: [employer] Prolia  co-insurance: 20% Admin fee co-insurance: 20%  Secondary: --- Prolia  co-insurance:  Admin fee co-insurance:   Medical Benefit Details: Date Benefits were checked: 12/19/23 Deductible: NO/ Coinsurance: 20%/ Admin Fee: 20%  Prior Auth: APPROVED PA# [id number]  Expiration Date: 12/25/23-12/24/24  # of doses approved: 2 ----------------------------------------------------------------------- Option# 2- Med Obtained from pharmacy:  Pharmacy benefit: Copay $645.79 (Paid to pharmacy) Admin Fee: 20% (Pay at clinic)  Prior Auth: N/A PA# Expiration Date:   # of doses approved:   If patient wants fill through the pharmacy benefit please send prescription to: [employer], and include estimated need by date in rx notes. Pharmacy will ship medication directly to the office.  Patient NOT eligible for Prolia  Copay Card. Copay Card can make patient's cost as little as $25. Link to apply: [email or web address]://[email or web address]  ** This summary of benefits is an estimation of the patient's out-of-pocket cost. Exact cost may very based on individual plan coverage.

## 2023-12-25 NOTE — Telephone Encounter (Signed)
 New PA needed for Prolia .

## 2023-12-25 NOTE — Telephone Encounter (Signed)
 Pharmacy Patient Advocate Encounter   Received notification from Pt Calls Messages that prior authorization for PROLIA  is required/requested.   Insurance verification completed.   The patient is insured through [employer] .   Per test claim: PA required; PA submitted to above mentioned insurance via Availity Key/confirmation #/EOC [id number] Status is pending

## 2024-01-06 ENCOUNTER — Other Ambulatory Visit: Payer: Self-pay | Admitting: *Deleted

## 2024-01-06 DIAGNOSIS — M810 Age-related osteoporosis without current pathological fracture: Secondary | ICD-10-CM

## 2024-01-07 ENCOUNTER — Other Ambulatory Visit

## 2024-01-10 DIAGNOSIS — H02403 Unspecified ptosis of bilateral eyelids: Secondary | ICD-10-CM | POA: Diagnosis not present

## 2024-01-13 ENCOUNTER — Other Ambulatory Visit

## 2024-01-13 ENCOUNTER — Ambulatory Visit (INDEPENDENT_AMBULATORY_CARE_PROVIDER_SITE_OTHER)

## 2024-01-13 DIAGNOSIS — Z7985 Long-term (current) use of injectable non-insulin antidiabetic drugs: Secondary | ICD-10-CM

## 2024-01-13 DIAGNOSIS — E118 Type 2 diabetes mellitus with unspecified complications: Secondary | ICD-10-CM

## 2024-01-13 DIAGNOSIS — Z7984 Long term (current) use of oral hypoglycemic drugs: Secondary | ICD-10-CM | POA: Diagnosis not present

## 2024-01-13 DIAGNOSIS — M810 Age-related osteoporosis without current pathological fracture: Secondary | ICD-10-CM

## 2024-01-13 DIAGNOSIS — I10 Essential (primary) hypertension: Secondary | ICD-10-CM | POA: Diagnosis not present

## 2024-01-13 LAB — COMPREHENSIVE METABOLIC PANEL WITH GFR
ALT: 14 U/L (ref 0–35)
AST: 15 U/L (ref 0–37)
Albumin: 4.3 g/dL (ref 3.5–5.2)
Alkaline Phosphatase: 70 U/L (ref 39–117)
BUN: 33 mg/dL — ABNORMAL HIGH (ref 6–23)
CO2: 26 meq/L (ref 19–32)
Calcium: 9.5 mg/dL (ref 8.4–10.5)
Chloride: 108 meq/L (ref 96–112)
Creatinine, Ser: 0.69 mg/dL (ref 0.40–1.20)
GFR: 80.89 mL/min
Glucose, Bld: 129 mg/dL — ABNORMAL HIGH (ref 70–99)
Potassium: 5.1 meq/L (ref 3.5–5.1)
Sodium: 142 meq/L (ref 135–145)
Total Bilirubin: 0.4 mg/dL (ref 0.2–1.2)
Total Protein: 6.7 g/dL (ref 6.0–8.3)

## 2024-01-13 LAB — MICROALBUMIN / CREATININE URINE RATIO
Creatinine,U: 85.4 mg/dL
Microalb Creat Ratio: UNDETERMINED mg/g (ref 0.0–30.0)
Microalb, Ur: <0.7 mg/dL

## 2024-01-13 LAB — HEMOGLOBIN A1C: Hgb A1c MFr Bld: 6.1 % (ref 4.6–6.5)

## 2024-01-13 LAB — VITAMIN D 25 HYDROXY (VIT D DEFICIENCY, FRACTURES): VITD: 34.56 ng/mL (ref 30.00–100.00)

## 2024-01-13 MED ORDER — DENOSUMAB 60 MG/ML ~~LOC~~ SOSY
60.0000 mg | PREFILLED_SYRINGE | Freq: Once | SUBCUTANEOUS | Status: AC
  Administered 2024-07-20: 60 mg via SUBCUTANEOUS

## 2024-01-13 NOTE — Progress Notes (Signed)
 After obtaining consent, and per orders of Dr. Madelon Scheuermann, injection of Prolia  given by Serge Dancer. Patient instructed to report any adverse reaction to me immediately. Pt tolerated shot well in left arm

## 2024-01-14 ENCOUNTER — Other Ambulatory Visit: Payer: Self-pay | Admitting: Internal Medicine

## 2024-01-14 LAB — LIPID PANEL W/REFLEX DIRECT LDL
Cholesterol: 133 mg/dL
HDL: 61 mg/dL
LDL Cholesterol (Calc): 58 mg/dL
Non-HDL Cholesterol (Calc): 72 mg/dL
Total CHOL/HDL Ratio: 2.2 (calc)
Triglycerides: 68 mg/dL

## 2024-01-14 NOTE — Telephone Encounter (Signed)
 Refilled: 11/20/2023 Last OV: 12/09/2023 Next OV: 10/6/20025 Abnormal BUN on 01/13/2024

## 2024-01-15 ENCOUNTER — Ambulatory Visit: Payer: Self-pay | Admitting: Internal Medicine

## 2024-01-17 DIAGNOSIS — D485 Neoplasm of uncertain behavior of skin: Secondary | ICD-10-CM | POA: Diagnosis not present

## 2024-01-17 DIAGNOSIS — H02403 Unspecified ptosis of bilateral eyelids: Secondary | ICD-10-CM | POA: Diagnosis not present

## 2024-01-28 ENCOUNTER — Other Ambulatory Visit: Payer: Self-pay | Admitting: Urology

## 2024-01-28 DIAGNOSIS — N200 Calculus of kidney: Secondary | ICD-10-CM

## 2024-01-29 ENCOUNTER — Ambulatory Visit: Payer: Self-pay | Admitting: Urology

## 2024-01-29 ENCOUNTER — Encounter: Payer: Self-pay | Admitting: Urology

## 2024-01-29 ENCOUNTER — Ambulatory Visit
Admission: RE | Admit: 2024-01-29 | Discharge: 2024-01-29 | Disposition: A | Discharge status: Home / Self Care | Source: Ambulatory Visit | Attending: Urology | Admitting: Urology

## 2024-01-29 ENCOUNTER — Ambulatory Visit
Admission: RE | Admit: 2024-01-29 | Discharge: 2024-01-29 | Disposition: A | Discharge status: Home / Self Care | Attending: Urology | Admitting: Urology

## 2024-01-29 VITALS — BP 131/70 | HR 56 | Ht 63.0 in | Wt 160.0 lb

## 2024-01-29 DIAGNOSIS — N200 Calculus of kidney: Secondary | ICD-10-CM

## 2024-01-29 DIAGNOSIS — N2889 Other specified disorders of kidney and ureter: Secondary | ICD-10-CM | POA: Diagnosis not present

## 2024-01-29 DIAGNOSIS — R319 Hematuria, unspecified: Secondary | ICD-10-CM | POA: Diagnosis not present

## 2024-01-29 DIAGNOSIS — R109 Unspecified abdominal pain: Secondary | ICD-10-CM | POA: Diagnosis not present

## 2024-01-29 LAB — URINALYSIS, COMPLETE
Bilirubin, UA: NEGATIVE
Glucose, UA: NEGATIVE
Ketones, UA: NEGATIVE
Leukocytes,UA: NEGATIVE
Nitrite, UA: NEGATIVE
Protein,UA: NEGATIVE
Specific Gravity, UA: 1.02 (ref 1.005–1.030)
Urobilinogen, Ur: 0.2 mg/dL (ref 0.2–1.0)
pH, UA: 6 (ref 5.0–7.5)

## 2024-01-29 LAB — MICROSCOPIC EXAMINATION
Bacteria, UA: NONE SEEN
Epithelial Cells (non renal): NONE SEEN /HPF (ref 0–10)
RBC, Urine: NONE SEEN /HPF (ref 0–2)

## 2024-01-29 NOTE — Progress Notes (Signed)
 In and Out Catheterization  Patient is present today for a I & O catheterization due to having trouble giving a urine sample. Patient was cleaned and prepped in a sterile fashion with betadine  . A 14FR cath was inserted no complications were noted , of urine return was noted, urine was yellow clear in color. A clean urine sample was collected for UA. Bladder was drained  And catheter was removed with out difficulty.    Performed by: Dorsie Gaunt  Follow up/ Additional notes: N/a

## 2024-01-29 NOTE — Progress Notes (Signed)
 01/29/24 11:50 AM   [name] December 03, 1940 [id number]  Referring provider:  Thersia Flax, MD [address] [place],  [place] [address]  Urological history  1. High risk hematuria  -former smoker  -cysto w/ retrogrades, 12/2019 - 7 mm right renal pelvic stone.  Normal bilateral retrograde pyelograms.  Bladder unremarkable   2. Nephrolithiasis  -right URS, 12/2019  -CT, 2021 - 2 mm nonobstructing right upper pole renal calculus (coronal image  90). Two nonobstructing right lower pole renal calculi measuring up  to 3 mm (series 2/images 94-95). Additional 2 mm nonobstructing left  lower pole renal calculus (coronal image 68). No hydronephrosis.   3. Renal cyst  -CT, 2021 - Dominant 6.2 x 5.4 cm left upper pole renal cyst (series 15/image  82), benign (Bosniak I). Additional small bilateral renal cysts  measuring up to 12 mm in the lateral left lower pole (series  15/image 91), benign (Bosniak I). No enhancing renal lesions.     Chief Complaint  Patient presents with   Follow-up    HPI: [name] is a 83 y.o.female who presents today for yearly follow up.   Previous records reviewed.     She is having 1-7 daytime voids, 3 or more episodes of nocturia mild urge to urinate.  She does not have urinary leakage.  She wears 1 panty liner daily.  She does not limit fluid intake.  She does not engage in toilet mapping.  Patient denies any modifying or aggravating factors.  Patient denies any recent UTI's, gross hematuria, dysuria or suprapubic/flank pain.  Patient denies any fevers, chills, nausea or vomiting.    KUB 4 mm right renal stone  CATH UA yellow clear, specific gravity 1.020, pH 6.0, trace heme and 0-5 WBCs   PMH: Past Medical History:  Diagnosis Date   Anxiety    Diabetes mellitus    GERD (gastroesophageal reflux disease)    History of kidney stones    Hyperlipidemia    Hypertension    Hypothyroidism    Osteoporosis    Positive  colorectal cancer screening using Cologuard test 2019   Thyroid  disease     Surgical History: Past Surgical History:  Procedure Laterality Date   BREAST CYST ASPIRATION Bilateral 25+ yrs ago   CATARACT EXTRACTION W/PHACO Right 03/02/2015   Procedure: CATARACT EXTRACTION PHACO AND INTRAOCULAR LENS PLACEMENT (IOC) TORIC LENS;  Surgeon: Annell Kidney, MD;  Location: [facility];  Service: Ophthalmology;  Laterality: Right;  TORIC   CATARACT EXTRACTION W/PHACO Left 03/30/2015   Procedure: CATARACT EXTRACTION PHACO AND INTRAOCULAR LENS PLACEMENT (IOC);  Surgeon: Annell Kidney, MD;  Location: [facility];  Service: Ophthalmology;  Laterality: Left;  TORIC  DIABETIC - oral meds   CERVICAL POLYPECTOMY     COLONOSCOPY  01/2007   Dr Grandville Lax   COLONOSCOPY WITH PROPOFOL  N/A 10/23/2017   Procedure: COLONOSCOPY WITH PROPOFOL ;  Surgeon: Marshall Skeeter, MD;  Location: [facility];  Service: Endoscopy;  Laterality: N/A;   CYSTOSCOPY W/ RETROGRADES Bilateral 12/07/2019   Procedure: CYSTOSCOPY WITH RETROGRADE PYELOGRAM;  Surgeon: Dustin Gimenez, MD;  Location: [facility];  Service: Urology;  Laterality: Bilateral;   CYSTOSCOPY/URETEROSCOPY/HOLMIUM LASER/STENT PLACEMENT Right 12/07/2019   Procedure: CYSTOSCOPY/URETEROSCOPY/HOLMIUM LASER/STENT PLACEMENT;  Surgeon: Dustin Gimenez, MD;  Location: [facility];  Service: Urology;  Laterality: Right;   EYE SURGERY     HUMERUS FRACTURE SURGERY     KNEE ARTHROPLASTY Right 10/18/2021   Procedure: COMPUTER ASSISTED TOTAL KNEE ARTHROPLASTY;  Surgeon:  Hooten, Robbie Chiles, MD;  Location: [facility];  Service: Orthopedics;  Laterality: Right;   TUBAL LIGATION  1982   VAGINAL DELIVERY     WRIST FRACTURE SURGERY      Home Medications:  Allergies as of 01/29/2024       Reactions   Latex Rash   IgE < 0.10 (WNL) on 10/05/2021   Nickel Rash        Medication List        Accurate as of Jan 29, 2024 11:50 AM. If you have any questions, ask  your nurse or doctor.          STOP taking these medications    cholecalciferol  25 MCG (1000 UNIT) tablet Commonly known as: VITAMIN D3 Stopped by: Cathleen Coach Tyliyah Mcmeekin       TAKE these medications    ALPRAZolam  0.25 MG tablet Commonly known as: XANAX  Take 1 tablet (0.25 mg total) by mouth 2 (two) times daily as needed for anxiety.   aspirin  EC 81 MG tablet Take 1 tablet (81 mg total) by mouth daily. Swallow whole.   atorvastatin  20 MG tablet Commonly known as: LIPITOR Take 1 tablet (20 mg total) by mouth daily.   celecoxib  200 MG capsule Commonly known as: CELEBREX  TAKE 1 CAPSULE BY MOUTH TWICE A DAY   cyanocobalamin  1000 MCG/ML injection Commonly known as: VITAMIN B12 INJECT 1 ML (1,000 MCG TOTAL) INTO THE MUSCLE EVERY 30 DAYS.   glipiZIDE  5 MG tablet Commonly known as: GLUCOTROL  TAKE 1/2 TABLET BY MOUTH AT BREAKFAST, AND 2 TABLETS AT DINNERTIME   levothyroxine  88 MCG tablet Commonly known as: SYNTHROID  TAKE 1 TABLET BY MOUTH EVERY DAY ON EMPTY STOMACH WITH WATER AT LEAST 30-60 MINS BEFORE BREAKFAST   losartan  50 MG tablet Commonly known as: COZAAR  Take 1 tablet (50 mg total) by mouth daily.   metFORMIN  500 MG 24 hr tablet Commonly known as: GLUCOPHAGE -XR TAKE 1 TABLET BY MOUTH EVERY DAY WITH BREAKFAST   ONE TOUCH ULTRA 2 w/Device Kit Use to check blood sugars once daily.   OneTouch Ultra test strip Generic drug: glucose blood Use to check blood sugars up to 3 times daily.   pioglitazone  15 MG tablet Commonly known as: ACTOS  Take 1 tablet (15 mg total) by mouth daily with supper.   Semaglutide (0.25 or 0.5MG /DOS) 2 MG/3ML Sopn Inject 0.25 mg into the skin once a week.   sertraline  50 MG tablet Commonly known as: ZOLOFT  Take 1 tablet (50 mg total) by mouth daily.   SYRINGE 3CC/25GX1" 25G X 1" 3 ML Misc Use for b12 injections        Allergies:  Allergies  Allergen Reactions   Latex Rash    IgE < 0.10 (WNL) on 10/05/2021   Nickel Rash     Family History: Family History  Problem Relation Age of Onset   Hypertension Mother    Stroke Mother 9       hemorrhagic   Aneurysm Mother    Heart disease Father    Cancer Father        Lung CA,  died of AMI while in [place] getting Laetril   Aneurysm Sister    Depression Sister    Diabetes Brother    Hypertension Brother    Breast cancer Maternal Grandmother 40   Heart disease Maternal Grandfather    Cancer Maternal Grandfather    Hypertension Son     Social History:  reports that she has never smoked. She has never used smokeless tobacco. She  reports that she does not drink alcohol and does not use drugs.   Physical Exam: BP 131/70   Pulse (!) 56   Ht 5\' 3"  (1.6 m)   Wt 160 lb (72.6 kg)   BMI 28.34 kg/m   Constitutional:  Well nourished. Alert and oriented, No acute distress. HEENT: [place] AT, moist mucus membranes.  Trachea midline Cardiovascular: No clubbing, cyanosis, or edema. Respiratory: Normal respiratory effort, no increased work of breathing. Neurologic: Grossly intact, no focal deficits, moving all 4 extremities. Psychiatric: Normal mood and affect.    Laboratory Data: Lab Results  Component Value Date   CREATININE 0.69 01/13/2024    Lab Results  Component Value Date   HGBA1C 6.1 01/13/2024  I have reviewed the labs.   Pertinent Imaging: KUB 4 mm right lower pole stone I have independently reviewed the films.  See HPI.  Radiologist interpretation pending.   Assessment & Plan:    Nephrolithiasis  - stable right lower pole stone  2. High risk hematuria - work  up 2021 - nephrolithiasis - Denies any gross hematuria - UA negative for micro heme  Return in about 1 year (around 01/28/2025) for KUB, UA, OAB questionnaire .  Briant Camper   [facility] [address] [place], [place] [address] [phone]

## 2024-02-11 ENCOUNTER — Encounter: Payer: Self-pay | Admitting: Ophthalmology

## 2024-02-11 ENCOUNTER — Other Ambulatory Visit: Payer: Self-pay

## 2024-02-14 ENCOUNTER — Telehealth: Payer: Self-pay | Admitting: Internal Medicine

## 2024-02-14 NOTE — Telephone Encounter (Signed)
 Monitored anesthesiology care is what MAC means per pt' granddaughter. Pt's granddaughter stated that pt is not being put completely asleep. She stated that they have a fill in anesthesiologist and that's why the question is being asked. Versad and fentanyl  I the only thing they will be using for anesthesia. Pt is going to be having an eye lid procedure. Heidi Llamas would like to see if a letter could be provided stating that pt is okay to received the monitored anesthesiology care.

## 2024-02-14 NOTE — Telephone Encounter (Signed)
 Pt's granddaughter is aware that letter is available on mychart.

## 2024-02-14 NOTE — Telephone Encounter (Signed)
 Copied from CRM [id number]. Topic: Clinical - Medical Advice >> Feb 13, 2024  4:40 PM Chuck Crater wrote: Reason for CRM: Patient is scheduled for surgery on 06/20 and the new Anesthesiologist an needing a letter from Dr. Madelon Scheuermann stating that she can tolerate MAC due to her heart rate dropping. If Loris Ros doesn't answer, leave a message.

## 2024-02-18 NOTE — Discharge Instructions (Signed)

## 2024-02-21 ENCOUNTER — Encounter
Admission: RE | Disposition: A | Discharge status: Home / Self Care | Payer: Self-pay | Source: Home / Self Care | Attending: Ophthalmology

## 2024-02-21 ENCOUNTER — Ambulatory Visit: Payer: Self-pay | Admitting: Anesthesiology

## 2024-02-21 ENCOUNTER — Encounter: Payer: Self-pay | Admitting: Ophthalmology

## 2024-02-21 ENCOUNTER — Ambulatory Visit
Admission: RE | Admit: 2024-02-21 | Discharge: 2024-02-21 | Disposition: A | Discharge status: Home / Self Care | Attending: Ophthalmology | Admitting: Ophthalmology

## 2024-02-21 ENCOUNTER — Other Ambulatory Visit: Payer: Self-pay

## 2024-02-21 DIAGNOSIS — I10 Essential (primary) hypertension: Secondary | ICD-10-CM | POA: Diagnosis not present

## 2024-02-21 DIAGNOSIS — H02403 Unspecified ptosis of bilateral eyelids: Secondary | ICD-10-CM | POA: Insufficient documentation

## 2024-02-21 LAB — GLUCOSE, CAPILLARY: Glucose-Capillary: 123 mg/dL — ABNORMAL HIGH (ref 70–99)

## 2024-02-21 MED ORDER — LIDOCAINE HCL (CARDIAC) PF 100 MG/5ML IV SOSY
PREFILLED_SYRINGE | INTRAVENOUS | Status: DC | PRN
  Administered 2024-02-21: 40 mg via INTRAVENOUS

## 2024-02-21 MED ORDER — PROPOFOL 10 MG/ML IV BOLUS
INTRAVENOUS | Status: AC
  Filled 2024-02-21: qty 20

## 2024-02-21 MED ORDER — LIDOCAINE-EPINEPHRINE 2 %-1:100000 IJ SOLN
INTRAMUSCULAR | Status: DC | PRN
  Administered 2024-02-21: 1.5 mL via OPHTHALMIC

## 2024-02-21 MED ORDER — DEXMEDETOMIDINE HCL IN NACL 200 MCG/50ML IV SOLN
INTRAVENOUS | Status: DC | PRN
  Administered 2024-02-21: 4 ug via INTRAVENOUS

## 2024-02-21 MED ORDER — TRAMADOL HCL 50 MG PO TABS: ORAL_TABLET | ORAL | 0 refills | Status: AC

## 2024-02-21 MED ORDER — BSS IO SOLN
INTRAOCULAR | Status: DC | PRN
  Administered 2024-02-21: 15 mL via INTRAOCULAR

## 2024-02-21 MED ORDER — LIDOCAINE HCL (PF) 2 % IJ SOLN
INTRAMUSCULAR | Status: AC
  Filled 2024-02-21: qty 5

## 2024-02-21 MED ORDER — ERYTHROMYCIN 5 MG/GM OP OINT: TOPICAL_OINTMENT | OPHTHALMIC | 2 refills | Status: DC

## 2024-02-21 MED ORDER — PROPOFOL 10 MG/ML IV BOLUS
INTRAVENOUS | Status: DC | PRN
  Administered 2024-02-21: 40 mg via INTRAVENOUS

## 2024-02-21 MED ORDER — SODIUM CHLORIDE 0.9 % IV SOLN: INTRAVENOUS | Status: DC | PRN

## 2024-02-21 MED ORDER — GLYCOPYRROLATE 0.2 MG/ML IJ SOLN
INTRAMUSCULAR | Status: AC
  Filled 2024-02-21: qty 1

## 2024-02-21 MED ORDER — LACTATED RINGERS IV SOLN: INTRAVENOUS | Status: DC

## 2024-02-21 MED ORDER — DEXMEDETOMIDINE HCL IN NACL 80 MCG/20ML IV SOLN
INTRAVENOUS | Status: AC
  Filled 2024-02-21: qty 20

## 2024-02-21 MED ORDER — TETRACAINE HCL 0.5 % OP SOLN
OPHTHALMIC | Status: DC | PRN
  Administered 2024-02-21: 2 [drp] via OPHTHALMIC

## 2024-02-21 MED ORDER — FENTANYL CITRATE (PF) 100 MCG/2ML IJ SOLN
INTRAMUSCULAR | Status: AC
  Filled 2024-02-21: qty 2

## 2024-02-21 MED ORDER — GLYCOPYRROLATE 0.2 MG/ML IJ SOLN
INTRAMUSCULAR | Status: DC | PRN
  Administered 2024-02-21: .2 mg via INTRAVENOUS

## 2024-02-21 MED ORDER — OXYCODONE HCL 5 MG/5ML PO SOLN: 5.0000 mg | Freq: Once | ORAL | Status: DC | PRN

## 2024-02-21 MED ORDER — PROPOFOL 1000 MG/100ML IV EMUL
INTRAVENOUS | Status: AC
  Filled 2024-02-21: qty 100

## 2024-02-21 MED ORDER — OXYCODONE HCL 5 MG PO TABS: 5.0000 mg | ORAL_TABLET | Freq: Once | ORAL | Status: DC | PRN

## 2024-02-21 MED ORDER — FENTANYL CITRATE PF 50 MCG/ML IJ SOSY: 25.0000 ug | PREFILLED_SYRINGE | INTRAMUSCULAR | Status: DC | PRN

## 2024-02-21 MED ORDER — ERYTHROMYCIN 5 MG/GM OP OINT
TOPICAL_OINTMENT | OPHTHALMIC | Status: DC | PRN
  Administered 2024-02-21: 1 via OPHTHALMIC

## 2024-02-21 SURGICAL SUPPLY — 2 items
NDL FILTER BLUNT 18X1 1/2 (NEEDLE) ×1 IMPLANT
NDL HYPO 30X.5 LL (NEEDLE) ×2 IMPLANT

## 2024-02-21 NOTE — Anesthesia Preprocedure Evaluation (Addendum)
 Anesthesia Evaluation  Patient identified by MRN, date of birth, ID band Patient awake    Reviewed: Allergy & Precautions, NPO status , Patient's Chart, lab work & pertinent test results  History of Anesthesia Complications Negative for: history of anesthetic complications  Airway Mallampati: III  TM Distance: <3 FB Neck ROM: full    Dental  (+) Chipped   Pulmonary neg pulmonary ROS, neg shortness of breath   Pulmonary exam normal        Cardiovascular Exercise Tolerance: Good hypertension,  Rhythm:regular Rate:Bradycardia     Neuro/Psych  Headaches PSYCHIATRIC DISORDERS       Neuromuscular disease    GI/Hepatic Neg liver ROS,GERD  Controlled,,  Endo/Other  diabetesHypothyroidism    Renal/GU      Musculoskeletal   Abdominal   Peds  Hematology negative hematology ROS (+)   Anesthesia Other Findings Patient is bradycardic at baseline, in the 50s, but asymptomatic, she has clearance to proceed with procedure and anesthesia from Dr. Madelon Scheuermann  Past Medical History: No date: Anxiety No date: Diabetes mellitus No date: GERD (gastroesophageal reflux disease) No date: History of kidney stones No date: Hyperlipidemia No date: Hypertension No date: Hypothyroidism No date: Osteoporosis 2019: Positive colorectal cancer screening using Cologuard test No date: Thyroid  disease  Past Surgical History: 25+ yrs ago: BREAST CYST ASPIRATION; Bilateral 03/02/2015: CATARACT EXTRACTION W/PHACO; Right     Comment:  Procedure: CATARACT EXTRACTION PHACO AND INTRAOCULAR               LENS PLACEMENT (IOC) TORIC LENS;  Surgeon: Annell Kidney, MD;  Location: [facility];  Service:              Ophthalmology;  Laterality: Right;  TORIC 03/30/2015: CATARACT EXTRACTION W/PHACO; Left     Comment:  Procedure: CATARACT EXTRACTION PHACO AND INTRAOCULAR               LENS PLACEMENT (IOC);  Surgeon: Annell Kidney, MD;               Location: [facility];  Service: Ophthalmology;                Laterality: Left;  TORIC  DIABETIC - oral meds No date: CERVICAL POLYPECTOMY 01/2007: COLONOSCOPY     Comment:  Dr Grandville Lax 10/23/2017: COLONOSCOPY WITH PROPOFOL ; N/A     Comment:  Procedure: COLONOSCOPY WITH PROPOFOL ;  Surgeon: Marshall Skeeter, MD;  Location: [facility] ENDOSCOPY;  Service:               Endoscopy;  Laterality: N/A; 12/07/2019: CYSTOSCOPY W/ RETROGRADES; Bilateral     Comment:  Procedure: CYSTOSCOPY WITH RETROGRADE PYELOGRAM;                Surgeon: Dustin Gimenez, MD;  Location: [facility] ORS;                Service: Urology;  Laterality: Bilateral; 12/07/2019: CYSTOSCOPY/URETEROSCOPY/HOLMIUM LASER/STENT PLACEMENT;  Right     Comment:  Procedure: CYSTOSCOPY/URETEROSCOPY/HOLMIUM LASER/STENT               PLACEMENT;  Surgeon: Dustin Gimenez, MD;  Location: [facility]              ORS;  Service: Urology;  Laterality: Right; No date: EYE SURGERY No date: HUMERUS FRACTURE SURGERY 10/18/2021: KNEE ARTHROPLASTY; Right  Comment:  Procedure: COMPUTER ASSISTED TOTAL KNEE ARTHROPLASTY;                Surgeon: Arlyne Lame, MD;  Location: [facility] ORS;                Service: Orthopedics;  Laterality: Right; 1982: TUBAL LIGATION No date: VAGINAL DELIVERY No date: WRIST FRACTURE SURGERY  BMI    Body Mass Index: 28.17 kg/m      Reproductive/Obstetrics negative OB ROS                             Anesthesia Physical Anesthesia Plan  ASA: 3  Anesthesia Plan: MAC   Post-op Pain Management:    Induction: Intravenous  PONV Risk Score and Plan:   Airway Management Planned: LMA  Additional Equipment:   Intra-op Plan:   Post-operative Plan: Extubation in OR  Informed Consent: I have reviewed the patients History and Physical, chart, labs and discussed the procedure including the risks, benefits and alternatives for the proposed anesthesia  with the patient or authorized representative who has indicated his/her understanding and acceptance.     Dental Advisory Given  Plan Discussed with: Anesthesiologist, CRNA and Surgeon  Anesthesia Plan Comments: (Patient consented for risks of anesthesia including but not limited to:  - adverse reactions to medications - damage to eyes, teeth, lips or other oral mucosa - nerve damage due to positioning  - sore throat or hoarseness - Damage to heart, brain, nerves, lungs, other parts of body or loss of life  Patient voiced understanding and assent.)       Anesthesia Quick Evaluation

## 2024-02-21 NOTE — H&P (Signed)
 [facility]: [facility]  Primary Care Physician:  Thersia Flax, MD Ophthalmologist: Dr. Vonna Guardian. [name], M.D.  Pre-Procedure History & Physical: HPI:  [name] is a 83 y.o. female here for periocular surgery.   Past Medical History:  Diagnosis Date   Anxiety    Diabetes mellitus    GERD (gastroesophageal reflux disease)    History of kidney stones    Hyperlipidemia    Hypertension    Hypothyroidism    Osteoporosis    Positive colorectal cancer screening using Cologuard test 2019   Thyroid  disease     Past Surgical History:  Procedure Laterality Date   BREAST CYST ASPIRATION Bilateral 25+ yrs ago   CATARACT EXTRACTION W/PHACO Right 03/02/2015   Procedure: CATARACT EXTRACTION PHACO AND INTRAOCULAR LENS PLACEMENT (IOC) TORIC LENS;  Surgeon: Annell Kidney, MD;  Location: [facility];  [name]: Ophthalmology;  Laterality: Right;  TORIC   CATARACT EXTRACTION W/PHACO Left 03/30/2015   Procedure: CATARACT EXTRACTION PHACO AND INTRAOCULAR LENS PLACEMENT (IOC);  Surgeon: Annell Kidney, MD;  Location: [facility];  [name]: Ophthalmology;  Laterality: Left;  TORIC  DIABETIC - oral meds   CERVICAL POLYPECTOMY     COLONOSCOPY  01/2007   Dr Grandville Lax   COLONOSCOPY WITH PROPOFOL  N/A 10/23/2017   Procedure: COLONOSCOPY WITH PROPOFOL ;  Surgeon: Marshall Skeeter, MD;  Location: [facility] END[facility];  [name]: Endoscopy;  Laterality: N/A;   CYSTOSCOPY W/ RETROGRADES Bilateral 12/07/2019   Procedure: CYSTOSCOPY WITH RETROGRADE PYELOGRAM;  Surgeon: Dustin Gimenez, MD;  Location: [facility] ORS;  [name]: Urology;  Laterality: Bilateral;   CYSTOSCOPY/URETEROSCOPY/HOLMIUM LASER/STENT PLACEMENT Right 12/07/2019   Procedure: CYSTOSCOPY/URETEROSCOPY/HOLMIUM LASER/STENT PLACEMENT;  Surgeon: Dustin Gimenez, MD;  Location: [facility] ORS;  [name]: Urology;  Laterality: Right;   EYE SURGERY     HUMERUS FRACTURE SURGERY     KNEE ARTHROPLASTY Right 10/18/2021   Procedure:  COMPUTER ASSISTED TOTAL KNEE ARTHROPLASTY;  Surgeon: Arlyne Lame, MD;  Location: [facility] ORS;  [name]: Orthopedics;  Laterality: Right;   TUBAL LIGATION  1982   VAGINAL DELIVERY     WRIST FRACTURE SURGERY      Prior to Admission medications   Medication Sig Start Date End Date Taking? Authorizing Provider  acetaminophen  (TYLENOL ) 650 MG CR tablet Take 650 mg by mouth every 8 (eight) hours as needed for pain.   Yes [provider]  ALPRAZolam  (XANAX ) 0.25 MG tablet Take 1 tablet (0.25 mg total) by mouth 2 (two) times daily as needed for anxiety. 12/06/22  Yes Thersia Flax, MD  aspirin  EC 81 MG tablet Take 1 tablet (81 mg total) by mouth daily. Swallow whole. 12/21/21  Yes Gollan, Timothy J, MD  atorvastatin  (LIPITOR) 20 MG tablet Take 1 tablet (20 mg total) by mouth daily. 10/29/23  Yes Thersia Flax, MD  celecoxib  (CELEBREX ) 200 MG capsule TAKE 1 CAPSULE BY MOUTH TWICE A DAY 01/14/24  Yes Thersia Flax, MD  glipiZIDE  (GLUCOTROL ) 5 MG tablet TAKE 1/2 TABLET BY MOUTH AT BREAKFAST, AND 2 TABLETS AT DINNERTIME 10/29/23  Yes Tullo, Teresa L, MD  levothyroxine  (SYNTHROID ) 88 MCG tablet TAKE 1 TABLET BY MOUTH EVERY DAY ON EMPTY STOMACH WITH WATER AT LEAST 30-60 MINS BEFORE BREAKFAST 10/29/23  Yes Thersia Flax, MD  losartan  (COZAAR ) 50 MG tablet Take 1 tablet (50 mg total) by mouth daily. 10/29/23  Yes Thersia Flax, MD  metFORMIN  (GLUCOPHAGE -XR) 500 MG 24 hr tablet TAKE 1 TABLET BY MOUTH EVERY DAY WITH BREAKFAST 10/01/23  Yes Thersia Flax, MD  pioglitazone  (ACTOS ) 15 MG tablet Take 1 tablet (15 mg total) by mouth daily with supper. 10/29/23  Yes Thersia Flax, MD  sertraline  (ZOLOFT ) 50 MG tablet Take 1 tablet (50 mg total) by mouth daily. 10/29/23  Yes Thersia Flax, MD  Blood Glucose Monitoring Suppl (ONE TOUCH ULTRA 2) w/Device KIT Use to check blood sugars once daily. 02/08/21   Thersia Flax, MD  cyanocobalamin  (VITAMIN B12) 1000 MCG/ML injection INJECT 1 ML (1,000 MCG TOTAL)  INTO THE MUSCLE EVERY 30 DAYS. 03/11/23   Thersia Flax, MD  glucose blood (ONETOUCH ULTRA) test strip Use to check blood sugars up to 3 times daily. 06/28/23   Thersia Flax, MD  Semaglutide ,0.25 or 0.5MG /DOS, 2 MG/3ML SOPN Inject 0.25 mg into the skin once a week. Patient not taking: Reported on 02/20/2024 12/09/23   Thersia Flax, MD  Syringe/Needle, Disp, (SYRINGE 3CC/25GX1) 25G X 1 3 ML MISC Use for b12 injections 07/29/23   Thersia Flax, MD    Allergies as of 01/23/2024 - Review Complete 12/09/2023  Allergen Reaction Noted   Latex Rash 06/21/2014   Nickel Rash 11/11/2019    Family History  Problem Relation Age of Onset   Hypertension Mother    Stroke Mother 47       hemorrhagic   Aneurysm Mother    Heart disease Father    Cancer Father        Lung CA,  died of AMI while in [place] getting Laetril   Aneurysm Sister    Depression Sister    Diabetes Brother    Hypertension Brother    Breast cancer Maternal Grandmother 55   Heart disease Maternal Grandfather    Cancer Maternal Grandfather    Hypertension Son     Social History   Socioeconomic History   Marital status: Widowed    Spouse name: Not on file   Number of children: 2   Years of education: Not on file   Highest education level: Not on file  Occupational History   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 Lost brother also. Has living will. Son and daughter hold health care POA. Would accept resuscitation but no prolonged artificial ventilation. Not sure about feeding tube.    Social Drivers of Corporate investment banker Strain: Low Risk  (04/15/2018)   Overall Financial Resource Strain (CARDIA)     Difficulty of Paying Living Expenses: Not hard at all  Food Insecurity: No Food Insecurity (04/15/2018)   Hunger Vital Sign    Worried About Running Out of Food in the Last Year: Never true    Ran Out of Food in the Last Year: Never true  Transportation Needs: No Transportation Needs (04/15/2018)   PRAPARE - Administrator, Civil [name] (Medical): No    Lack of Transportation (Non-Medical): No  Physical Activity: Insufficiently Active (04/15/2018)   Exercise Vital Sign    Days of Exercise per Week: 2 days    Minutes of Exercise per Session: 20 min  Stress: No Stress Concern Present (04/15/2018)   [employer]  of Occupational Health - Occupational Stress Questionnaire    Feeling of Stress : Not at all  Social Connections: Unknown (04/15/2018)   Social Connection and Isolation Panel    Frequency of Communication with Friends and Family: Not on file    Frequency of Social Gatherings with Friends and Family: Not on file    Attends Religious Services: Not on file    Active Member of Clubs or Organizations: Not on file    Attends Banker Meetings: Not on file    Marital Status: Widowed  Intimate Partner Violence: Not At Risk (04/15/2018)   Humiliation, Afraid, Rape, and Kick questionnaire    Fear of Current or Ex-Partner: No    Emotionally Abused: No    Physically Abused: No    Sexually Abused: No    Review of Systems: See HPI, otherwise negative ROS  Physical Exam: BP (!) 165/71   Temp (!) 97.2 F (36.2 C) (Temporal)   Resp 12   Ht 5' 2.99 (1.6 m)   Wt 73.2 kg   SpO2 99%   BMI 28.60 kg/m  General:   Alert and cooperative in NAD Head:  Normocephalic and atraumatic. Respiratory:  Normal work of breathing.  Impression/Plan: [name] is here for periocular surgery.  Risks, benefits, limitations, and alternatives regarding surgery have been reviewed with the patient.  Questions have been answered.  All parties agreeable.   Zacarias Hermann, MD   02/21/2024, 8:39 AM

## 2024-02-21 NOTE — Interval H&P Note (Signed)
 History and Physical Interval Note:  02/21/2024 8:39 AM  [name]  has presented today for surgery, with the diagnosis of Ptosis.  The various methods of treatment have been discussed with the patient and family. After consideration of risks, benefits and other options for treatment, the patient has consented to  Procedure(s): REPAIR, BLEPHAROPTOSIS (Bilateral) as a surgical intervention.  The patient's history has been reviewed, patient examined, no change in status, stable for surgery.  I have reviewed the patient's chart and labs.  Questions were answered to the patient's satisfaction.     Althea Atkinson, Juris Gosnell M

## 2024-02-21 NOTE — Anesthesia Postprocedure Evaluation (Signed)
 Anesthesia Post Note  Patient: RUCHY WILDRICK  Procedure(s) Performed: REPAIR, BLEPHAROPTOSIS (Bilateral)  Patient location during evaluation: PACU Anesthesia Type: MAC Level of consciousness: awake and alert Pain management: pain level controlled Vital Signs Assessment: post-procedure vital signs reviewed and stable Respiratory status: spontaneous breathing, nonlabored ventilation and respiratory function stable Cardiovascular status: blood pressure returned to baseline and stable Postop Assessment: no apparent nausea or vomiting Anesthetic complications: no   No notable events documented.   Last Vitals:  Vitals:   02/21/24 0945 02/21/24 0955  BP: (!) 151/67 (!) 161/70  Pulse: (!) 55 (!) 51  Resp: (!) 9 13  Temp:    SpO2: 99% 99%    Last Pain:  Vitals:   02/21/24 0945  TempSrc:   PainSc: 0-No pain                 Portia Brittle Wendy Mikles

## 2024-02-21 NOTE — Op Note (Signed)
 Preoperative Diagnosis:  Visually significant blepharoptosis bilateral  Upper Eyelid(s)  Postoperative Diagnosis:  Same.  Procedure(s) Performed:   Blepharoptosis repair with levator aponeurosis advancement bilateral  Upper Eyelid(s)  Teaching Surgeon: Vonna Guardian. Althea Atkinson, M.D.  Assistants: none  Anesthesia: MAC  Specimens: None.  Estimated Blood Loss: Minimal.  Complications: None.  Operative Findings: None Dictated  PROCEDURE:  Allergies were reviewed and the patient is allergic to latex and nickel..   After the risks, benefits, complications and alternatives were discussed with the patient, appropriate informed consent was obtained. While seated in an upright position and looking in primary gaze, the mid pupillary line was marked on the upper eyelid margins bilaterally. The patient was then brought to the operating suite and reclined supine.  Timeout was conducted and the patient was sedated. Local anesthetic consisting of a 50-50 mixture of 2% lidocaine  with epinephrine  and 0.75% bupivacaine  with added Hylenex was injected subcutaneously to the bilateral  upper eyelid(s). After adequate local was instilled, the patient was prepped and draped in the usual sterile fashion for eyelid surgery.   Attention was turned to the upper eyelids. A 10mm upper eyelid crease incision line was marked with calipers on both  upper eyelid(s).  Attention was turned to the  right  upper eyelid. A #15 blade was used to open the premarked incision line and hemostasis was obtained with bipolar cautery. Westcott scissors were then used to transect through orbicularis for the length of the incision down to the tarsal plate. Epitarsus was dissected to create a smooth surface to suture to. Dissection was then carried superiorly in the plane between orbicularis and orbital septum. Once the preaponeurotic fat pocket was identified, the orbital septum was opened. This revealed the levator and its aponeurosis.     Attention was then turned to the opposite eyelid where the same procedure was performed in the same manner.   3 interrupted 6-0 Prolene sutures were then passed partial thickness through the tarsal plates of both  upper eyelid(s). These sutures were placed in line with the mid pupillary, medial limbal, and lateral limbal lines. The sutures were fixed to the levator aponeurosis and adjusted until a nice lid height and contour were achieved. Once nice symmetry was achieved, the skin incisions were closed with a running 6-0 fast absorbing plain suture. The patient tolerated the procedure well. Erythromycin ophthalmic ointment was applied to the incision site(s) followed by ice packs. The patient was taken to the recovery area where she recovered without difficulty.  Post-Op Plan/Instructions:  Ms. Cleere was instructed to use ice packs frequently for the next 48 hours. Her was instructed to use Erythromycin ophthalmic ointment on her incisions 4 times a day for the next 12 to 14 days. She was given a prescription for tramadol  (or similar) for pain control should Tylenol  not be effective. She was asked to to follow up in 2-3 weeks' time at the [facility] in [place], [place] or sooner as needed for problems.  Charissa Knowles M. Althea Atkinson, M.D. Ophthalmology

## 2024-02-21 NOTE — Transfer of Care (Signed)
 Immediate Anesthesia Transfer of Care Note  Patient: [name]  Procedure(s) Performed: REPAIR, BLEPHAROPTOSIS (Bilateral)  Patient Location: PACU  Anesthesia Type: MAC  Level of Consciousness: awake, alert  and patient cooperative  Airway and Oxygen Therapy: Patient Spontanous Breathing and Patient connected to supplemental oxygen  Post-op Assessment: Post-op Vital signs reviewed, Patient's Cardiovascular Status Stable, Respiratory Function Stable, Patent Airway and No signs of Nausea or vomiting  Post-op Vital Signs: Reviewed and stable  Complications: No notable events documented.

## 2024-03-03 ENCOUNTER — Other Ambulatory Visit: Payer: Self-pay | Admitting: Internal Medicine

## 2024-03-18 ENCOUNTER — Other Ambulatory Visit: Payer: Self-pay | Admitting: Internal Medicine

## 2024-03-29 ENCOUNTER — Other Ambulatory Visit: Payer: Self-pay | Admitting: Internal Medicine

## 2024-03-29 DIAGNOSIS — F418 Other specified anxiety disorders: Secondary | ICD-10-CM

## 2024-04-20 DIAGNOSIS — E113293 Type 2 diabetes mellitus with mild nonproliferative diabetic retinopathy without macular edema, bilateral: Secondary | ICD-10-CM | POA: Diagnosis not present

## 2024-04-20 DIAGNOSIS — Z961 Presence of intraocular lens: Secondary | ICD-10-CM | POA: Diagnosis not present

## 2024-04-20 DIAGNOSIS — H43813 Vitreous degeneration, bilateral: Secondary | ICD-10-CM | POA: Diagnosis not present

## 2024-04-20 LAB — HM DIABETES EYE EXAM

## 2024-04-22 ENCOUNTER — Other Ambulatory Visit: Payer: Self-pay | Admitting: Internal Medicine

## 2024-05-06 DIAGNOSIS — M5416 Radiculopathy, lumbar region: Secondary | ICD-10-CM | POA: Diagnosis not present

## 2024-05-14 DIAGNOSIS — M48061 Spinal stenosis, lumbar region without neurogenic claudication: Secondary | ICD-10-CM | POA: Diagnosis not present

## 2024-05-14 DIAGNOSIS — M5126 Other intervertebral disc displacement, lumbar region: Secondary | ICD-10-CM | POA: Diagnosis not present

## 2024-05-14 DIAGNOSIS — M5416 Radiculopathy, lumbar region: Secondary | ICD-10-CM | POA: Diagnosis not present

## 2024-05-14 DIAGNOSIS — M5125 Other intervertebral disc displacement, thoracolumbar region: Secondary | ICD-10-CM | POA: Diagnosis not present

## 2024-05-14 DIAGNOSIS — M5127 Other intervertebral disc displacement, lumbosacral region: Secondary | ICD-10-CM | POA: Diagnosis not present

## 2024-05-28 DIAGNOSIS — M5416 Radiculopathy, lumbar region: Secondary | ICD-10-CM | POA: Diagnosis not present

## 2024-06-08 ENCOUNTER — Ambulatory Visit: Admitting: Internal Medicine

## 2024-06-11 NOTE — Progress Notes (Signed)
 [name]                                          MRN: [id number]   06/11/2024   The [facility] Quality Team Specialist reviewed this patient medical record for the purposes of chart review for care gap closure. The following were reviewed: abstraction for care gap closure-controlling blood pressure.    VBCI Quality Team

## 2024-06-15 ENCOUNTER — Other Ambulatory Visit (HOSPITAL_COMMUNITY): Payer: Self-pay

## 2024-06-15 ENCOUNTER — Telehealth: Payer: Self-pay

## 2024-06-15 NOTE — Telephone Encounter (Signed)
 Pt ready for scheduling for PROLIA  on or after : 07/15/24  Option# 1: Buy/Bill (Office supplied medication)  Out-of-pocket cost due at time of clinic visit: $357  Number of injection/visits approved: 2  Primary: [employer] Prolia  co-insurance: 20% Admin fee co-insurance: 20%  Secondary: --- Prolia  co-insurance:  Admin fee co-insurance:   Medical Benefit Details: Date Benefits were checked: 06/15/24 Deductible: NO/ Coinsurance: 20%/ Admin Fee: 20%   Prior Auth: APPROVED PA# [id number] Expiration Date: 12/25/23-12/24/24   # of doses approved: 2 ----------------------------------------------------------------------- Option# 2- Med Obtained from pharmacy:  Pharmacy benefit: Copay $645.79 (Paid to pharmacy) Admin Fee: 20% (Pay at clinic)  Prior Auth: N/A PA# Expiration Date:   # of doses approved:   If patient wants fill through the pharmacy benefit please send prescription to: [facility], and include estimated need by date in rx notes. Pharmacy will ship medication directly to the office.  Patient NOT eligible for Prolia  Copay Card. Copay Card can make patient's cost as little as $25. Link to apply: [email or web address]://www.[email or web address]  ** This summary of benefits is an estimation of the patient's out-of-pocket cost. Exact cost may very based on individual plan coverage.

## 2024-06-15 NOTE — Telephone Encounter (Signed)
 [name]

## 2024-06-15 NOTE — Telephone Encounter (Signed)
 Prolia  VOB initiated via [email or web address]  Next Prolia  inj DUE: 07/15/24

## 2024-07-01 DIAGNOSIS — M5116 Intervertebral disc disorders with radiculopathy, lumbar region: Secondary | ICD-10-CM | POA: Diagnosis not present

## 2024-07-01 DIAGNOSIS — M5126 Other intervertebral disc displacement, lumbar region: Secondary | ICD-10-CM | POA: Diagnosis not present

## 2024-07-19 NOTE — Telephone Encounter (Signed)
 Last read by Reena KATHEE Arenas at 11:11AM on 07/11/2024.

## 2024-07-20 ENCOUNTER — Ambulatory Visit (INDEPENDENT_AMBULATORY_CARE_PROVIDER_SITE_OTHER)

## 2024-07-20 DIAGNOSIS — M810 Age-related osteoporosis without current pathological fracture: Secondary | ICD-10-CM | POA: Diagnosis not present

## 2024-07-20 DIAGNOSIS — Z23 Encounter for immunization: Secondary | ICD-10-CM | POA: Diagnosis not present

## 2024-07-20 MED ORDER — DENOSUMAB 60 MG/ML ~~LOC~~ SOSY: 60.0000 mg | PREFILLED_SYRINGE | SUBCUTANEOUS | Status: AC

## 2024-07-20 NOTE — Progress Notes (Signed)
 Patient was administered a Prolia  injection into her right arm subcutaneously. Patient tolerated the Prolia  injection well.  Patient also wanted a Regular Flu vaccine and it was administered into her left deltoid. Patient tolerated the Regular Flu vaccine well.

## 2024-07-20 NOTE — Addendum Note (Signed)
 Addended by: SEBASTIAN NORRIS A on: 07/20/2024 09:24 AM   Modules accepted: Orders

## 2024-08-10 ENCOUNTER — Other Ambulatory Visit: Payer: Self-pay | Admitting: Internal Medicine

## 2024-08-10 DIAGNOSIS — Z1231 Encounter for screening mammogram for malignant neoplasm of breast: Secondary | ICD-10-CM

## 2024-08-17 ENCOUNTER — Encounter: Payer: Self-pay | Admitting: Internal Medicine

## 2024-08-18 ENCOUNTER — Other Ambulatory Visit: Payer: Self-pay | Admitting: Internal Medicine

## 2024-08-18 MED ORDER — ONETOUCH ULTRA 2 W/DEVICE KIT: PACK | 0 refills | Status: AC

## 2024-09-02 ENCOUNTER — Ambulatory Visit
Admission: RE | Admit: 2024-09-02 | Discharge: 2024-09-02 | Disposition: A | Discharge status: Home / Self Care | Source: Ambulatory Visit | Attending: Internal Medicine | Admitting: Internal Medicine

## 2024-09-02 DIAGNOSIS — Z1231 Encounter for screening mammogram for malignant neoplasm of breast: Secondary | ICD-10-CM | POA: Insufficient documentation

## 2024-09-07 ENCOUNTER — Other Ambulatory Visit: Payer: Self-pay | Admitting: Internal Medicine

## 2024-09-07 DIAGNOSIS — R928 Other abnormal and inconclusive findings on diagnostic imaging of breast: Secondary | ICD-10-CM

## 2024-09-08 ENCOUNTER — Ambulatory Visit: Payer: Self-pay | Admitting: Internal Medicine

## 2024-09-10 ENCOUNTER — Ambulatory Visit
Admission: RE | Admit: 2024-09-10 | Discharge: 2024-09-10 | Disposition: A | Discharge status: Home / Self Care | Source: Ambulatory Visit | Attending: Internal Medicine | Admitting: Internal Medicine

## 2024-09-10 DIAGNOSIS — R928 Other abnormal and inconclusive findings on diagnostic imaging of breast: Secondary | ICD-10-CM

## 2024-09-11 ENCOUNTER — Ambulatory Visit: Admitting: Internal Medicine

## 2024-09-11 ENCOUNTER — Encounter: Payer: Self-pay | Admitting: Internal Medicine

## 2024-09-11 VITALS — BP 104/64 | HR 62 | Temp 97.7°F | Ht 62.0 in | Wt 154.6 lb

## 2024-09-11 DIAGNOSIS — E034 Atrophy of thyroid (acquired): Secondary | ICD-10-CM | POA: Diagnosis not present

## 2024-09-11 DIAGNOSIS — E559 Vitamin D deficiency, unspecified: Secondary | ICD-10-CM

## 2024-09-11 DIAGNOSIS — F418 Other specified anxiety disorders: Secondary | ICD-10-CM

## 2024-09-11 DIAGNOSIS — E785 Hyperlipidemia, unspecified: Secondary | ICD-10-CM | POA: Diagnosis not present

## 2024-09-11 DIAGNOSIS — F411 Generalized anxiety disorder: Secondary | ICD-10-CM | POA: Diagnosis not present

## 2024-09-11 DIAGNOSIS — Z9889 Other specified postprocedural states: Secondary | ICD-10-CM | POA: Diagnosis not present

## 2024-09-11 DIAGNOSIS — Z7984 Long term (current) use of oral hypoglycemic drugs: Secondary | ICD-10-CM

## 2024-09-11 DIAGNOSIS — E663 Overweight: Secondary | ICD-10-CM | POA: Diagnosis not present

## 2024-09-11 DIAGNOSIS — E1169 Type 2 diabetes mellitus with other specified complication: Secondary | ICD-10-CM

## 2024-09-11 DIAGNOSIS — E118 Type 2 diabetes mellitus with unspecified complications: Secondary | ICD-10-CM

## 2024-09-11 LAB — LIPID PANEL
Cholesterol: 138 mg/dL (ref 28–200)
HDL: 59.9 mg/dL
LDL Cholesterol: 52 mg/dL (ref 10–99)
NonHDL: 78.51
Total CHOL/HDL Ratio: 2
Triglycerides: 134 mg/dL (ref 10.0–149.0)
VLDL: 26.8 mg/dL (ref 0.0–40.0)

## 2024-09-11 LAB — COMPREHENSIVE METABOLIC PANEL WITH GFR
ALT: 10 U/L (ref 3–35)
AST: 14 U/L (ref 5–37)
Albumin: 4.6 g/dL (ref 3.5–5.2)
Alkaline Phosphatase: 79 U/L (ref 39–117)
BUN: 22 mg/dL (ref 6–23)
CO2: 27 meq/L (ref 19–32)
Calcium: 9.6 mg/dL (ref 8.4–10.5)
Chloride: 104 meq/L (ref 96–112)
Creatinine, Ser: 0.82 mg/dL (ref 0.40–1.20)
GFR: 66.36 mL/min
Glucose, Bld: 91 mg/dL (ref 70–99)
Potassium: 4.2 meq/L (ref 3.5–5.1)
Sodium: 139 meq/L (ref 135–145)
Total Bilirubin: 0.5 mg/dL (ref 0.2–1.2)
Total Protein: 6.6 g/dL (ref 6.0–8.3)

## 2024-09-11 LAB — LDL CHOLESTEROL, DIRECT: Direct LDL: 57 mg/dL

## 2024-09-11 LAB — VITAMIN D 25 HYDROXY (VIT D DEFICIENCY, FRACTURES): VITD: 27.87 ng/mL — ABNORMAL LOW (ref 30.00–100.00)

## 2024-09-11 LAB — HEMOGLOBIN A1C: Hgb A1c MFr Bld: 5.7 % (ref 4.6–6.5)

## 2024-09-11 LAB — TSH: TSH: 4.09 u[IU]/mL (ref 0.35–5.50)

## 2024-09-11 NOTE — Patient Instructions (Addendum)
" °  You are due for your tetanus-diptheria-pertussis vaccine   (TDaP)   You should  get it at your pharmacy  Your mammogram wil be repeated in one year Y "

## 2024-09-11 NOTE — Assessment & Plan Note (Signed)
 Aggravated by winter's' restriction of her activities.  SHE HAS STOPPED TAKING  sertraline  to 50 mg daily

## 2024-09-11 NOTE — Progress Notes (Signed)
 "  Subjective:  Patient ID: [name], female    DOB: [birth year]  Age: 84 y.o. MRN: [id number]  CC: The primary encounter diagnosis was Vitamin D  deficiency. Diagnoses of Hypothyroidism due to acquired atrophy of thyroid , Hyperlipidemia associated with type 2 diabetes mellitus (HCC), DM type 2, controlled, with complication (HCC), Depression with anxiety, GAD (generalized anxiety disorder), Overweight (BMI 25.0-29.9), and History of microdiscectomy were also pertinent to this visit.   HPI KANA REIMANN presents for  Chief Complaint  Patient presents with   medical managment of chronic issues    Back pain   UNCONTROLLED ANXIETY:  NOT TAKING ZOLOFT  ANYMORE.   ANXIETY IMPROVED SIMCE MAMMOGRAM WAS NORMAL   BACK PAIN:   S/P microdiskectomy by Pool in  LATE SEPTEMBER .  Still having low back PAIN  but not radiating.  Pain is  CONTROLLED WITH TYLENOL   and topical voltaren also using horse liniment from tractor supply) , RIGHT THIGH IS STILL NUMB BUT NO LEG WEAKNESS  GROIN PAIN RESOLVED.  STARTED PT YESTERDAY   expensive .  SHE IS ANGRY  THAT.  [employer] INS[employer] DRAGGED FEET ON APPROVAL FOR RUPTURED  DISK SURGERY .  DIDN'T HAVE SURGERY UNTIL OCT 29.SABRA  HSE HA S LEFT [employer] DUR TO THIS DELAY, NOW WITH [employer]  TYPE 2 DM: NOT TAKING OZEMPIC  DUE TO COST .    Checking blood sugars   fastings are 80 to 107   DIAGNOSTIC MAMMOGRAM DONE YESTERDAY WAS NORMAL.  ONE YEAR FOLLOW UP ADVISED   Outpatient Medications Prior to Visit  Medication Sig Dispense Refill   acetaminophen  (TYLENOL ) 650 MG CR tablet Take 650 mg by mouth every 8 (eight) hours as needed for pain.     aspirin  EC 81 MG tablet Take 1 tablet (81 mg total) by mouth daily. Swallow whole.     atorvastatin  (LIPITOR) 20 MG tablet Take 1 tablet (20 mg total) by mouth daily. 90 tablet 3   Blood Glucose Monitoring Suppl (ONE TOUCH ULTRA 2) w/Device KIT Use to check blood sugars once daily. 1 kit 0   cyanocobalamin  (VITAMIN B12) 1000 MCG/ML injection  INJECT 1 ML (1,000 MCG) INTRAMUSCULARLY EVERY 30 DAYS 3 mL 3   glipiZIDE  (GLUCOTROL ) 5 MG tablet TAKE 1/2 TABLET BY MOUTH AT BREAKFAST, AND 2 TABLETS AT DINNERTIME 225 tablet 2   levothyroxine  (SYNTHROID ) 88 MCG tablet TAKE 1 TABLET BY MOUTH EVERY DAY ON EMPTY STOMACH WITH WATER AT LEAST 30-60 MINS BEFORE BREAKFAST 90 tablet 3   losartan  (COZAAR ) 50 MG tablet Take 1 tablet (50 mg total) by mouth daily. 90 tablet 3   metFORMIN  (GLUCOPHAGE -XR) 500 MG 24 hr tablet TAKE 1 TABLET BY MOUTH EVERY DAY WITH BREAKFAST 90 tablet 3   ONETOUCH ULTRA test strip USE TO CHECK BLOOD SUGARS UP TO 3 TIMES DAILY. 100 strip 10   pioglitazone  (ACTOS ) 15 MG tablet TAKE 1 TABLET (15 MG TOTAL) BY MOUTH DAILY WITH SUPPER 90 tablet 1   Syringe/Needle, Disp, (SYRINGE 3CC/25GX1) 25G X 1 3 ML MISC Use for b12 injections 50 each 0   traMADol  (ULTRAM ) 50 MG tablet Take 1 every 4-6 hours as needed for pain not controlled by Tylenol  6 tablet 0   ALPRAZolam  (XANAX ) 0.25 MG tablet Take 1 tablet (0.25 mg total) by mouth 2 (two) times daily as needed for anxiety. 20 tablet 0   celecoxib  (CELEBREX ) 200 MG capsule TAKE 1 CAPSULE BY MOUTH TWICE A DAY (Patient not taking: Reported on 09/11/2024) 60 capsule 1  erythromycin  ophthalmic ointment Apply to sutures 4 times a day for 10-12 days.  Discontinue if allergy develops and call our office (Patient not taking: Reported on 09/11/2024) 3.5 g 2   Semaglutide ,0.25 or 0.5MG /DOS, 2 MG/3ML SOPN Inject 0.25 mg into the skin once a week. (Patient not taking: Reported on 09/11/2024) 3 mL 2   sertraline  (ZOLOFT ) 25 MG tablet TAKE 1 TABLET (25 MG TOTAL) BY MOUTH DAILY. (Patient not taking: Reported on 09/11/2024) 90 tablet 1   sertraline  (ZOLOFT ) 50 MG tablet Take 1 tablet (50 mg total) by mouth daily. (Patient not taking: Reported on 09/11/2024)     Facility-Administered Medications Prior to Visit  Medication Dose Route Frequency Provider Last Rate Last Admin   denosumab  (PROLIA ) injection 60 mg  60 mg  Subcutaneous Q6 months Sharan Mcenaney L, MD   60 mg at 01/13/24 0855   [START ON 01/17/2025] denosumab  (PROLIA ) injection 60 mg  60 mg Subcutaneous Q6 months Marylynn Verneita CROME, MD        Review of Systems;  Patient denies headache, fevers, malaise, unintentional weight loss, skin rash, eye pain, sinus congestion and sinus pain, sore throat, dysphagia,  hemoptysis , cough, dyspnea, wheezing, chest pain, palpitations, orthopnea, edema, abdominal pain, nausea, melena, diarrhea, constipation, flank pain, dysuria, hematuria, urinary  Frequency, nocturia, numbness, tingling, seizures,  Focal weakness, Loss of consciousness,  Tremor, insomnia, depression, anxiety, and suicidal ideation.      Objective:  BP 104/64 (BP Location: Left Arm)   Pulse 62   Temp 97.7 F (36.5 C)   Ht 5' 2 (1.575 m)   Wt 154 lb 9.6 oz (70.1 kg)   SpO2 97%   BMI 28.28 kg/m   BP Readings from Last 3 Encounters:  09/11/24 104/64  02/21/24 (!) 161/70  01/29/24 131/70    Wt Readings from Last 3 Encounters:  09/11/24 154 lb 9.6 oz (70.1 kg)  02/21/24 161 lb 6.4 oz (73.2 kg)  01/29/24 160 lb (72.6 kg)    Physical Exam Vitals reviewed.  Constitutional:      General: She is not in acute distress.    Appearance: Normal appearance. She is normal weight. She is not ill-appearing, toxic-appearing or diaphoretic.  HENT:     Head: Normocephalic.  Eyes:     General: No scleral icterus.       Right eye: No discharge.        Left eye: No discharge.     Conjunctiva/sclera: Conjunctivae normal.  Cardiovascular:     Rate and Rhythm: Normal rate and regular rhythm.     Heart sounds: Normal heart sounds.  Pulmonary:     Effort: Pulmonary effort is normal. No respiratory distress.     Breath sounds: Normal breath sounds.  Musculoskeletal:        General: Normal range of motion.  Skin:    General: Skin is warm and dry.  Neurological:     General: No focal deficit present.     Mental Status: She is alert and oriented to  person, place, and time. Mental status is at baseline.  Psychiatric:        Mood and Affect: Mood normal.        Behavior: Behavior normal.        Thought Content: Thought content normal.        Judgment: Judgment normal.     Lab Results  Component Value Date   HGBA1C 5.7 09/11/2024   HGBA1C 6.1 01/13/2024   HGBA1C 6.4 09/30/2023  Lab Results  Component Value Date   CREATININE 0.82 09/11/2024   CREATININE 0.69 01/13/2024   CREATININE 0.69 09/30/2023    Lab Results  Component Value Date   WBC 6.5 09/30/2023   HGB 13.1 09/30/2023   HCT 39.4 09/30/2023   PLT 163.0 09/30/2023   GLUCOSE 91 09/11/2024   CHOL 138 09/11/2024   TRIG 134.0 09/11/2024   HDL 59.90 09/11/2024   LDLDIRECT 57.0 09/11/2024   LDLCALC 52 09/11/2024   ALT 10 09/11/2024   AST 14 09/11/2024   NA 139 09/11/2024   K 4.2 09/11/2024   CL 104 09/11/2024   CREATININE 0.82 09/11/2024   BUN 22 09/11/2024   CO2 27 09/11/2024   TSH 4.09 09/11/2024   HGBA1C 5.7 09/11/2024   MICROALBUR <0.7 01/13/2024    MM 3D DIAGNOSTIC MAMMOGRAM UNILATERAL LEFT BREAST Result Date: 09/10/2024 CLINICAL DATA:  Screening recall LEFT breast asymmetry seen in both views EXAM: DIGITAL DIAGNOSTIC UNILATERAL LEFT MAMMOGRAM WITH TOMOSYNTHESIS AND CAD TECHNIQUE: Left digital diagnostic mammography and breast tomosynthesis was performed. The images were evaluated with computer-aided detection. COMPARISON:  Previous exam(s). ACR Breast Density Category b: There are scattered areas of fibroglandular density. FINDINGS: The asymmetry described at the time of screening mammogram resolves with additional imaging, compatible with superimposition of benign normal breast tissue in a configuration which is stable compared to remote prior mammograms. There is no mammographic evidence of malignancy. Vascular atherosclerotic calcifications are present. IMPRESSION: The asymmetry described at the time of screening mammogram resolves with additional imaging,  compatible with superimposition of benign normal breast tissue. There is no mammographic evidence of malignancy. RECOMMENDATION: Patient may return to routine screening mammogram in 1 year.(Code:[id number]-B-01Y) I have discussed the findings and recommendations with the patient. If applicable, a reminder letter will be sent to the patient regarding the next appointment. BI-RADS CATEGORY  2: Benign. Electronically Signed   By: Norleen Croak M.D.   On: 09/10/2024 11:44    Assessment & Plan:  .Vitamin D  deficiency -     VITAMIN D  25 Hydroxy (Vit-D Deficiency, Fractures)  Hypothyroidism due to acquired atrophy of thyroid  -     TSH  Hyperlipidemia associated with type 2 diabetes mellitus (HCC) -     Lipid panel -     LDL cholesterol, direct  DM type 2, controlled, with complication [facility]) Assessment & Plan: With atherosclerosis and hypertension. A1c remains excellent on glipizide , actos  and metformin  . She has minimal microalbuminuria , tolerating losartan ,  And tolerating atorvastatin   she did not start ozempic  due to cost  Lab Results  Component Value Date   HGBA1C 5.7 09/11/2024   Lab Results  Component Value Date   LABMICR See below: 01/29/2024   LABMICR See below: 01/29/2023   MICROALBUR <0.7 01/13/2024       Orders: -     Hemoglobin A1c -     Comprehensive metabolic panel with GFR  Depression with anxiety Assessment & Plan: Aggravated by winter's' restriction of her activities.  SHE HAS STOPPED TAKING  sertraline  to 50 mg daily    GAD (generalized anxiety disorder) Assessment & Plan: Aggravated by health concerns, improved with resolution of concern for breast cancer with normal diagnostic mammogram.  She has discontinued zoloft .    Overweight (BMI 25.0-29.9) Assessment & Plan: Patient was prescribed ozempic  but declined to start medication    History of microdiscectomy Assessment & Plan: Done pamala Victory Gunnels in 2025 to relieve sciatirc.  She is feeling better and reports  resolution of  sciatica       I spent 34 minutes on the day of this face to face encounter reviewing patient's  most recent visit with cardiology,  nephrology,  and neurology,  prior relevant surgical and non surgical procedures, recent  labs and imaging studies, counseling on weight management,  reviewing the assessment and plan with patient, and post visit ordering and reviewing of  diagnostics and therapeutics with patient  .   Follow-up: Return in about 6 months (around 03/11/2025) for follow up diabetes.   Verneita LITTIE Kettering, MD "

## 2024-09-12 ENCOUNTER — Encounter: Payer: Self-pay | Admitting: Internal Medicine

## 2024-09-12 DIAGNOSIS — Z9889 Other specified postprocedural states: Secondary | ICD-10-CM | POA: Insufficient documentation

## 2024-09-12 DIAGNOSIS — F411 Generalized anxiety disorder: Secondary | ICD-10-CM | POA: Insufficient documentation

## 2024-09-12 NOTE — Assessment & Plan Note (Signed)
 Aggravated by health concerns, improved with resolution of concern for breast cancer with normal diagnostic mammogram.  She has discontinued zoloft .

## 2024-09-12 NOTE — Assessment & Plan Note (Signed)
 Patient was prescribed ozempic  but declined to start medication

## 2024-09-12 NOTE — Assessment & Plan Note (Signed)
 With atherosclerosis and hypertension. A1c remains excellent on glipizide , actos  and metformin  . She has minimal microalbuminuria , tolerating losartan ,  And tolerating atorvastatin   she did not start ozempic  due to cost  Lab Results  Component Value Date   HGBA1C 5.7 09/11/2024   Lab Results  Component Value Date   LABMICR See below: 01/29/2024   LABMICR See below: 01/29/2023   MICROALBUR <0.7 01/13/2024

## 2024-09-12 NOTE — Assessment & Plan Note (Signed)
 Done [name] in 2025 to relieve sciatirc.  She is feeling better and reports resolution of sciatica

## 2024-09-13 ENCOUNTER — Ambulatory Visit: Payer: Self-pay | Admitting: Internal Medicine

## 2024-09-13 MED ORDER — ERGOCALCIFEROL 1.25 MG (50000 UT) PO CAPS: 50000.0000 [IU] | ORAL_CAPSULE | ORAL | 0 refills | Status: AC

## 2024-09-19 ENCOUNTER — Other Ambulatory Visit: Payer: Self-pay | Admitting: Internal Medicine

## 2024-09-19 DIAGNOSIS — F418 Other specified anxiety disorders: Secondary | ICD-10-CM

## 2024-10-07 ENCOUNTER — Encounter: Payer: Self-pay | Admitting: *Deleted

## 2024-10-07 NOTE — Progress Notes (Signed)
 [name]                                          MRN: [id number]   10/07/2024   The [facility] Quality Team Specialist reviewed this patient medical record for the purposes of chart review for care gap closure. The following were reviewed: chart review for care gap closure-controlling blood pressure.    [facility] Quality Team

## 2025-01-28 ENCOUNTER — Ambulatory Visit: Admitting: Urology

## 2025-03-17 ENCOUNTER — Ambulatory Visit: Admitting: Internal Medicine
